# Patient Record
Sex: Male | Born: 1960
Health system: Southern US, Community
[De-identification: ages and names within clinical notes are randomized; demographics above are authoritative.]

## PROBLEM LIST (undated history)

## (undated) DIAGNOSIS — I82602 Acute embolism and thrombosis of unspecified veins of left upper extremity: Secondary | ICD-10-CM

## (undated) DIAGNOSIS — I872 Venous insufficiency (chronic) (peripheral): Secondary | ICD-10-CM

## (undated) DIAGNOSIS — Z950 Presence of cardiac pacemaker: Secondary | ICD-10-CM

## (undated) DIAGNOSIS — R072 Precordial pain: Secondary | ICD-10-CM

## (undated) DIAGNOSIS — E785 Hyperlipidemia, unspecified: Secondary | ICD-10-CM

## (undated) DIAGNOSIS — E119 Type 2 diabetes mellitus without complications: Secondary | ICD-10-CM

## (undated) DIAGNOSIS — C801 Malignant (primary) neoplasm, unspecified: Secondary | ICD-10-CM

## (undated) DIAGNOSIS — Z8673 Personal history of transient ischemic attack (TIA), and cerebral infarction without residual deficits: Secondary | ICD-10-CM

## (undated) DIAGNOSIS — E669 Obesity, unspecified: Secondary | ICD-10-CM

## (undated) DIAGNOSIS — N189 Chronic kidney disease, unspecified: Secondary | ICD-10-CM

## (undated) DIAGNOSIS — I495 Sick sinus syndrome: Secondary | ICD-10-CM

## (undated) DIAGNOSIS — Z8489 Family history of other specified conditions: Secondary | ICD-10-CM

## (undated) DIAGNOSIS — G4700 Insomnia, unspecified: Secondary | ICD-10-CM

## (undated) DIAGNOSIS — I10 Essential (primary) hypertension: Secondary | ICD-10-CM

## (undated) DIAGNOSIS — F32A Depression, unspecified: Secondary | ICD-10-CM

## (undated) DIAGNOSIS — G4730 Sleep apnea, unspecified: Secondary | ICD-10-CM

## (undated) DIAGNOSIS — G40909 Epilepsy, unspecified, not intractable, without status epilepticus: Secondary | ICD-10-CM

## (undated) DIAGNOSIS — M1990 Unspecified osteoarthritis, unspecified site: Secondary | ICD-10-CM

## (undated) DIAGNOSIS — E039 Hypothyroidism, unspecified: Secondary | ICD-10-CM

## (undated) DIAGNOSIS — R809 Proteinuria, unspecified: Secondary | ICD-10-CM

## (undated) DIAGNOSIS — R55 Syncope and collapse: Secondary | ICD-10-CM

## (undated) DIAGNOSIS — F329 Major depressive disorder, single episode, unspecified: Secondary | ICD-10-CM

## (undated) HISTORY — DX: Essential (primary) hypertension: I10

## (undated) HISTORY — DX: Venous insufficiency (chronic) (peripheral): I87.2

## (undated) HISTORY — DX: Hyperlipidemia, unspecified: E78.5

## (undated) HISTORY — DX: Obesity, unspecified: E66.9

## (undated) HISTORY — DX: Personal history of transient ischemic attack (TIA), and cerebral infarction without residual deficits: Z86.73

## (undated) HISTORY — DX: Precordial pain: R07.2

## (undated) HISTORY — DX: Proteinuria, unspecified: R80.9

## (undated) HISTORY — DX: Type 2 diabetes mellitus without complications: E11.9

## (undated) HISTORY — DX: Insomnia, unspecified: G47.00

## (undated) HISTORY — PX: COLONOSCOPY: SHX174

## (undated) HISTORY — DX: Sleep apnea, unspecified: G47.30

## (undated) HISTORY — PX: RESECTION BONE TUMOR FEMUR: SHX2326

---

## 2006-11-20 ENCOUNTER — Ambulatory Visit: Payer: Self-pay | Admitting: Cardiology

## 2008-07-06 ENCOUNTER — Encounter: Payer: Self-pay | Admitting: Cardiology

## 2008-07-24 ENCOUNTER — Ambulatory Visit: Payer: Self-pay | Admitting: Cardiology

## 2008-08-11 ENCOUNTER — Encounter: Payer: Self-pay | Admitting: Cardiology

## 2008-08-27 ENCOUNTER — Encounter: Payer: Self-pay | Admitting: Cardiology

## 2009-09-22 ENCOUNTER — Encounter: Payer: Self-pay | Admitting: Cardiology

## 2009-10-05 ENCOUNTER — Encounter: Payer: Self-pay | Admitting: Cardiology

## 2009-10-28 ENCOUNTER — Encounter: Payer: Self-pay | Admitting: Cardiology

## 2009-11-18 ENCOUNTER — Encounter: Payer: Self-pay | Admitting: Cardiology

## 2009-11-20 ENCOUNTER — Encounter: Payer: Self-pay | Admitting: Cardiology

## 2009-11-23 ENCOUNTER — Encounter: Payer: Self-pay | Admitting: Cardiology

## 2009-12-02 ENCOUNTER — Encounter: Payer: Self-pay | Admitting: Cardiology

## 2009-12-03 ENCOUNTER — Encounter: Payer: Self-pay | Admitting: Cardiology

## 2009-12-03 DIAGNOSIS — R609 Edema, unspecified: Secondary | ICD-10-CM | POA: Insufficient documentation

## 2009-12-03 DIAGNOSIS — IMO0002 Reserved for concepts with insufficient information to code with codable children: Secondary | ICD-10-CM | POA: Insufficient documentation

## 2009-12-03 DIAGNOSIS — I10 Essential (primary) hypertension: Secondary | ICD-10-CM | POA: Insufficient documentation

## 2009-12-03 DIAGNOSIS — E785 Hyperlipidemia, unspecified: Secondary | ICD-10-CM | POA: Insufficient documentation

## 2009-12-03 DIAGNOSIS — R072 Precordial pain: Secondary | ICD-10-CM | POA: Insufficient documentation

## 2009-12-03 DIAGNOSIS — E1165 Type 2 diabetes mellitus with hyperglycemia: Secondary | ICD-10-CM

## 2009-12-03 DIAGNOSIS — G4733 Obstructive sleep apnea (adult) (pediatric): Secondary | ICD-10-CM | POA: Insufficient documentation

## 2009-12-03 DIAGNOSIS — R809 Proteinuria, unspecified: Secondary | ICD-10-CM | POA: Insufficient documentation

## 2009-12-04 ENCOUNTER — Ambulatory Visit: Payer: Self-pay | Admitting: Cardiology

## 2009-12-04 ENCOUNTER — Encounter (INDEPENDENT_AMBULATORY_CARE_PROVIDER_SITE_OTHER): Payer: Self-pay | Admitting: *Deleted

## 2009-12-23 ENCOUNTER — Encounter: Payer: Self-pay | Admitting: Cardiology

## 2009-12-23 DIAGNOSIS — R072 Precordial pain: Secondary | ICD-10-CM

## 2009-12-23 HISTORY — DX: Precordial pain: R07.2

## 2009-12-24 ENCOUNTER — Ambulatory Visit: Payer: Self-pay | Admitting: Cardiology

## 2009-12-27 ENCOUNTER — Encounter: Payer: Self-pay | Admitting: Cardiology

## 2009-12-28 ENCOUNTER — Ambulatory Visit: Payer: Self-pay | Admitting: Cardiology

## 2010-08-24 NOTE — Letter (Signed)
Summary: External Correspondence/ OFFICE VISIT [facility]  External Correspondence/ OFFICE VISIT [facility]   Imported By: Bartholomew Boards 12/08/2009 14:55:29  _____________________________________________________________________  External Attachment:    Type:   Image     Comment:   External Document

## 2010-08-24 NOTE — Assessment & Plan Note (Signed)
Summary: NP-CHEST PAIN   Visit Type:  Initial Consult Referring Provider:  Lowanda Foster Primary Provider:  Stoney Bang  CC:  chest pain.  History of Present Illness: The patient is seen in consultation for the assessment of chest pain and shortness of breath.  Historically the patient has had a problem with volume overload.  An echo report from December, 2009, revealed that the patient had an ejection fraction of 60-65%.  There was mild increase in right ventricular size.  PA pressure was 38 mmHg.  The patient has significant sleep apnea.  He is using CPAP.  He sleeps in a recliner at night. The patient has exertional shortness of breath and chest discomfort.  He is significantly overweight.  By report his creatinine had gone up and he has been seen by nephrology. She tells me that his Glucophage and lisinopril were stopped and that his creatinine improved.  I do not have the numbers.  Preventive Screening-Counseling & Management  Alcohol-Tobacco     Smoking Status: quit     Year Started: 20 yrs     Year Quit: 15-16 yrs ago  Current Medications (verified): 1)  Aspir-Low 81 Mg Tbec (Aspirin) .... Take 1 Tablet By Mouth Once A Day 2)  Furosemide 80 Mg Tabs (Furosemide) .... Take 1 Tablet By Mouth Once A Day 3)  Tricor 145 Mg Tabs (Fenofibrate) .... Take 1 Tablet By Mouth Once A Day 4)  Cymbalta 60 Mg Cpep (Duloxetine Hcl) .... Take 1 Tablet By Mouth Once A Day 5)  Hydrochlorothiazide 25 Mg Tabs (Hydrochlorothiazide) .... Take 1 Tablet By Mouth Two Times A Day 6)  Atenolol 50 Mg Tabs (Atenolol) .... Take 1 Tablet By Mouth Once A Day 7)  Pravastatin Sodium 20 Mg Tabs (Pravastatin Sodium) .... Take 1 Tab By Mouth At Bedtime 8)  Glipizide 10 Mg Tabs (Glipizide) .... Take 1 Tablet By Mouth Two Times A Day 9)  Fish Oil 1000 Mg Caps (Omega-3 Fatty Acids) .... Take 1 Tablet By Mouth Two Times A Day 10)  Symlinpen 60 1000 Mcg/ml Soln (Pramlintide Acetate) .Marland Kitchen.. 15 Units Three Times A Day 11)  Doxepin  Hcl 100 Mg Caps (Doxepin Hcl) .... Take 1 Tablet By Mouth Two Times A Day For Tick Bite 12)  Humalog 100 Unit/ml Soln (Insulin Lispro (Human)) .... Sliding Scale 13)  C-Pap Machine .... Use As Directed At Bedtime  Allergies (verified): No Known Drug Allergies  Past History:  Past Medical History: SLEEP APNEA...Dr. Brandon Melnick and a EDEMA ..venous insufficiency Proteinuria OBESITY DM  HYPERTENSION, BENIGN  PRECORDIAL PAIN  HYPERLIPIDEMIA EF  60-65%.. echo.. December, 2009.... mild increased RV... 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 Father had 1st heart attack age 81 but died number of years later. Mother had heart attack age 38 Mother and sister had cancer of breast One sister had cancer of the colon  Social History: Reviewed history and no changes required. he does not smokeSmoking Status:  quit  Review of Systems       Patient denies fever, chills, headache, sweats, rash, change in vision, change in hearing, nausea vomiting, urinary symptoms.  All other systems are reviewed and are negative.  Vital Signs:  Patient profile:   50 year old male Height:      68 inches Weight:      323.25 pounds BMI:     49.33 O2 Sat:  97 % on Room air Pulse rate:   53 / minute BP sitting:   150 / 88  (left arm) Cuff size:   large  Vitals Entered By: Lovina Reach, LPN (May 13, 624THL 624THL AM)  Nutrition Counseling: Patient's BMI is greater than 25 and therefore counseled on weight management options.  O2 Flow:  Room air CC: chest pain Is Patient Diabetic? Yes Comments chest pain off/on x 1 year.  States he had been telling PMD about this, but did not do anything.  Also, recently sent to Dr. Hinda Lenis by PMD to check for blockages in his kidneys.  Kidney MD more persistant about being seen for this chest pain.   Also, c/o alot of pain in both legs, left worse.    Physical  Exam  General:  patient is overweight but stable in general. Head:  head is atraumatic. Eyes:  no xanthelasma. Neck:  no jugular venous distention.  No carotid bruits. Chest Wall:  no chest wall tenderness. Lungs:  lungs are clear.  Respiratory effort is nonlabored. Heart:  cardiac exam reveals S1-S2.  No clicks or significant murmurs. Abdomen:  abdomen is obese but soft. Msk:  no musculoskeletal deformities. Extremities:  trace peripheral edema. Skin:  no skin rashes. Psych:  patient is oriented to person time and place.  Affect is normal.   Impression & Recommendations:  Problem # 1:  DM (ICD-250.00)  His updated medication list for this problem includes:    Aspir-low 81 Mg Tbec (Aspirin) .Marland Kitchen... Take 1 tablet by mouth once a day    Glipizide 10 Mg Tabs (Glipizide) .Marland Kitchen... Take 1 tablet by mouth two times a day    Symlinpen 60 1000 Mcg/ml Soln (Pramlintide acetate) .Marland KitchenMarland KitchenMarland KitchenMarland Kitchen 15 units three times a day    Humalog 100 Unit/ml Soln (Insulin lispro (human)) ..... Sliding scale The patient is receiving treatment for his diabetes.  This increases his risk for cardiovascular disease and we will be aggressive.  Problem # 2:  OBSTRUCTIVE SLEEP APNEA (ICD-327.23) The patient does use his CPAP.  Problem # 3:  OBESITY, UNSPECIFIED (ICD-278.00) Weight loss would be very important for him.  This would certainly be important for all aspects of his care.  Problem # 4:  ESSENTIAL HYPERTENSION, BENIGN (ICD-401.1)  His updated medication list for this problem includes:    Aspir-low 81 Mg Tbec (Aspirin) .Marland Kitchen... Take 1 tablet by mouth once a day    Furosemide 80 Mg Tabs (Furosemide) .Marland Kitchen... Take 1 tablet by mouth once a day    Hydrochlorothiazide 25 Mg Tabs (Hydrochlorothiazide) .Marland Kitchen... Take 1 tablet by mouth two times a day    Atenolol 50 Mg Tabs (Atenolol) .Marland Kitchen... Take 1 tablet by mouth once a day blood pressure is mildly elevated today.  I chosen not to change his medicines until I know more information from  his nephrologist  Problem # 5:  HYPERLIPIDEMIA (ICD-272.4)  His updated medication list for this problem includes:    Tricor 145 Mg Tabs (Fenofibrate) .Marland Kitchen... Take 1 tablet by mouth once a day    Pravastatin Sodium 20 Mg Tabs (Pravastatin sodium) .Marland Kitchen... Take 1 tab by mouth at bedtime Patient is receiving no treatment for his elevated cholesterol.  Problem # 6:  PRECORDIAL PAIN (ICD-786.51)  His updated medication list for this problem includes:    Aspir-low 81 Mg Tbec (Aspirin) .Marland Kitchen... Take 1 tablet by mouth once a day    Atenolol 50 Mg Tabs (Atenolol) .Marland Kitchen... Take 1 tablet by mouth once a day  Orders: EKG w/ Interpretation (93000) Nuclear Med (Nuc Med) The main symptom at this time his shortness of breath and some chest heaviness with exertion.  This certainly could be anginal.  EKG is done today and reviewed by me.  There is sinus bradycardia.  There is no other significant abnormality.  I will arrange for the patient to have 2-D echo to reassess LV function and valvular function.  He will also have pharmacologic nuclear study to rule out significant ischemia.  I will then see him for followup.  Other Orders: 2-D Echocardiogram (2D Echo)  Patient Instructions: 1)  FOLLOW UP APPT WITH DR. Sharissa Brierley ON TUESDAY, MAY 24TH AT 2:15PM. 2)  Your physician has requested that you have an echocardiogram.  Echocardiography is a painless test that uses sound waves to create images of your heart. It provides your doctor with information about the size and shape of your heart and how well your heart's chambers and valves are working.  This procedure takes approximately one hour. There are no restrictions for this procedure. 3)  Your physician has requested that you have an Agricultural consultant.  For further information please visit [email or web address].  Please follow instruction sheet, as given.

## 2010-08-24 NOTE — Letter (Signed)
Summary: Lexiscan or Dobutamine Adult nurse at [facility]. [address]   [place], [place] [address]   Phone: [phone]  Fax: [phone]      Reynolds or Dobutamine Cardiolite Strss Test    [facility]  Appointment Date:_  Appointment Time:_  Your doctor has ordered a CARDIOLITE STRESS TEST using a medication to stimulate exercise so that you will not have to walk on the treadmill to determine the condition of your heart during stress. If you take blood pressure medication, ask your doctor if you should take it the day of your test. You should not have anything to eat or drink at least 4 hours before your test is scheduled, and no caffeine, including decaffeinated tea and coffee, chocolate, and soft drinks for 24 hours before your test.  You will need to register at the Outpatient/Main Entrance at the hospital 15 minutes before your appointment time. It is a good idea to bring a copy of your order with you. They will direct you to the Diagnostic Imaging (Radiology) Department.  You will be asked to undress from the waist up and given a hospital gown to wear, so dress comfortably from the waist down for example: Sweat pants, shorts, or skirt Rubber soled lace up shoes (tennis shoes)  Plan on about three hours from registration to release from the hospital   You may want to hold your fluid pills and diabetic meds until after your test, but you can take your other medications with a sip of water.

## 2010-08-24 NOTE — Assessment & Plan Note (Signed)
Summary: 2-3 WK F/U PER 5/13 OV-JM   Visit Type:  Follow-up Referring Provider:  Lowanda Foster Primary Provider:  Biagio Quint  CC:  shortness of breath and chest pain.  History of Present Illness: patient is seen in followup he is shortness of breath and chest pain.  I saw him on Dec 04, 2009. At that time he seems stable and plans were made to proceed with an echo and a nuclear stress study.  These studies were done.  2-D echo showed an ejection fraction of 60-65% with mild LVH.  There were no wall motion abnormalities.  Nuclear scan revealed no significant ischemia.  Patient returns today and he is stable.  He says that he will probably need knee surgery.  Preventive Screening-Counseling & Management  Alcohol-Tobacco     Smoking Status: quit     Year Quit: 1994  Current Medications (verified): 1)  Aspir-Low 81 Mg Tbec (Aspirin) .... Take 1 Tablet By Mouth Once A Day 2)  Furosemide 80 Mg Tabs (Furosemide) .... Take 1 Tablet By Mouth Once A Day 3)  Tricor 145 Mg Tabs (Fenofibrate) .... Take 1 Tablet By Mouth Once A Day 4)  Cymbalta 60 Mg Cpep (Duloxetine Hcl) .... Take 1 Tablet By Mouth Once A Day 5)  Hydrochlorothiazide 25 Mg Tabs (Hydrochlorothiazide) .... Take 1 Tablet By Mouth Two Times A Day 6)  Atenolol 50 Mg Tabs (Atenolol) .... Take 1 Tablet By Mouth Once A Day 7)  Pravastatin Sodium 20 Mg Tabs (Pravastatin Sodium) .... Take 1 Tab By Mouth At Bedtime 8)  Glipizide 10 Mg Tabs (Glipizide) .... Take 1 Tablet By Mouth Two Times A Day 9)  Fish Oil 1000 Mg Caps (Omega-3 Fatty Acids) .... Take 1 Tablet By Mouth Two Times A Day 10)  Symlinpen 60 1000 Mcg/ml Soln (Pramlintide Acetate) .... 20 Units Three Times A Day 11)  Humalog 100 Unit/ml Soln (Insulin Lispro (Human)) .... Sliding Scale 12)  C-Pap Machine .... Use As Directed At Bedtime 13)  Lortab 7.5-500 Mg Tabs (Hydrocodone-Acetaminophen) .... Take 1 Tablet By Mouth Three Times A Day  Allergies (verified): No Known Drug  Allergies  Comments:  Nurse/Medical Assistant: The patient's medication list and allergies were reviewed with the patient and were updated in the Medication and Allergy Lists.  Past History:  Past Medical History: SLEEP APNEA...Dr. Brandon Melnick and a EDEMA ..venous insufficiency Proteinuria OBESITY DM  HYPERTENSION, BENIGN  PRECORDIAL PAIN ...nuclear stress...12/23/2009.....no ischemia...EF 60% HYPERLIPIDEMIA EF  60-65%.. echo.. December, 2009.... mild increased RV... 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  All other systems are reviewed and are negative.  He does have discomfort in his knee  Vital Signs:  Patient profile:   50 year old male Height:      68 inches Weight:      324 pounds Pulse rate:   62 / minute BP sitting:   146 / 83  (left arm) Cuff size:   large  Vitals Entered By: Georgina Peer (December 28, 2009 2:39 PM)  Physical Exam  General:  patient is overweight and stable. Eyes:  no xanthelasma. Neck:  no jugulovenous that appeared Lungs:  lungs are clear.  Respiratory effort is nonlabored. Heart:  cardiac exam reveals S1-S2.  No clicks or significant murmurs. Abdomen:  abdomen is obese,  but soft. Extremities:  no peripheral edema. Psych:  patient is oriented to person time and place.  Affect is normal.   Impression & Recommendations:  Problem # 1:  EDEMA (ICD-782.3) The patient has no significant edema today.  This is stable.  Problem # 2:  OBESITY, UNSPECIFIED (ICD-278.00) Weight loss will be extremely important for the patient.  Problem # 3:  ESSENTIAL HYPERTENSION, BENIGN (ICD-401.1)  His updated medication list for this problem includes:    Aspir-low 81 Mg Tbec (Aspirin) .Marland Kitchen... Take 1 tablet by mouth once a day    Furosemide 80 Mg Tabs (Furosemide)  .Marland Kitchen... Take 1 tablet by mouth once a day    Hydrochlorothiazide 25 Mg Tabs (Hydrochlorothiazide) .Marland Kitchen... Take 1 tablet by mouth two times a day    Atenolol 50 Mg Tabs (Atenolol) .Marland Kitchen... Take 1 tablet by mouth once a day blood pressure is under reasonable control today.  No change in therapy.  Problem # 4:  PRECORDIAL PAIN (ICD-786.51)  His updated medication list for this problem includes:    Aspir-low 81 Mg Tbec (Aspirin) .Marland Kitchen... Take 1 tablet by mouth once a day    Atenolol 50 Mg Tabs (Atenolol) .Marland Kitchen... Take 1 tablet by mouth once a day The patient's echo and nuclear scan showed no evidence of significant abnormalities.  Further workup is not needed.   The patient is cleared for knee surgery from the cardiac viewpoint.  Patient Instructions: 1)  No further cardiac follow up needed.

## 2010-08-24 NOTE — Miscellaneous (Signed)
  Clinical Lists Changes  Observations: Added new observation of PAST MED HX: SLEEP APNEA...Dr. Brandon Melnick and a EDEMA ..venous insufficiency Proteinuria OBESITY DM  HYPERTENSION, BENIGN  PRECORDIAL PAIN ...nuclear stress...6/1/201..no ischemia...EF 60% HYPERLIPIDEMIA EF  60-65%.. echo.. December, 2009.... mild increased RV... 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 Brandon Melnick and a EDEMA ..venous insufficiency Proteinuria OBESITY DM  HYPERTENSION, BENIGN  PRECORDIAL PAIN ...nuclear stress...6/1/201..no ischemia...EF 60% HYPERLIPIDEMIA EF  60-65%.. echo.. December, 2009.... mild increased RV... 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

## 2010-08-24 NOTE — Letter (Signed)
Summary: External Correspondence/ OFFICE VISIT DR. HASANAJ  External Correspondence/ OFFICE VISIT DR. HASANAJ   Imported By: Bartholomew Boards 11/30/2009 11:18:30  _____________________________________________________________________  External Attachment:    Type:   Image     Comment:   External Document

## 2010-08-24 NOTE — Letter (Signed)
Summary: External Correspondence/ OFFICE VISIT [facility]  External Correspondence/ OFFICE VISIT [facility]   Imported By: Bartholomew Boards 12/08/2009 14:59:26  _____________________________________________________________________  External Attachment:    Type:   Image     Comment:   External Document

## 2010-08-24 NOTE — Letter (Signed)
Summary: External Correspondence/ OFFICE VISIT [facility]  External Correspondence/ OFFICE VISIT [facility]   Imported By: Bartholomew Boards 12/08/2009 14:57:28  _____________________________________________________________________  External Attachment:    Type:   Image     Comment:   External Document

## 2010-08-24 NOTE — Miscellaneous (Signed)
  Clinical Lists Changes  Problems: Removed problem of UNSPECIFIED SLEEP APNEA (ICD-780.57) Removed problem of DIAB W/RENAL MANIFESTS TYPE II/UNS NOT UNCNTRL (ICD-250.40) Added new problem of DM (ICD-250.00) Added new problem of PROTEINURIA (ICD-791.0) Observations: Added new observation of PAST MED HX: SLEEP APNEA...Dr. Brandon Melnick and a EDEMA ..venous insufficiency Proteinuria OBESITY DM  HYPERTENSION, BENIGN  PRECORDIAL PAIN  HYPERLIPIDEMIA EF  60-65%.. echo.. December, 2009.... mild increased RV... 38 mmHg   (12/03/2009 16:47)       Past History:  Past Medical History: SLEEP APNEA...Dr. Brandon Melnick and a EDEMA ..venous insufficiency Proteinuria OBESITY DM  HYPERTENSION, BENIGN  PRECORDIAL PAIN  HYPERLIPIDEMIA EF  60-65%.. echo.. December, 2009.... mild increased RV... 38 mmHg

## 2011-01-20 ENCOUNTER — Encounter: Payer: Self-pay | Admitting: Cardiology

## 2011-07-26 HISTORY — PX: TOTAL KNEE ARTHROPLASTY: SHX125

## 2013-01-28 ENCOUNTER — Telehealth: Payer: Self-pay | Admitting: *Deleted

## 2013-01-28 NOTE — Telephone Encounter (Signed)
Debra Hyler,  Called on behalf of pt concerned that pt is taking too much insulin. She stated that his PCP is also concerned about the amount of insulin the pt is taking. Pt had a 'spell', his bg bottomed out a week ago. Please call her back to discuss at [phone].

## 2013-01-28 NOTE — Telephone Encounter (Signed)
Discussed with patient, about a week ago he had a transient near-syncopal episode and apparently looked blue and recovered without any administration of glucose. Blood sugar before this episode was 104 and was normal later that night. He has been discussing passing out episode with his primary care physician. Advised him to call if blood sugars are low, meanwhile continue same dose, his last blood sugar was 85

## 2013-05-20 ENCOUNTER — Other Ambulatory Visit (INDEPENDENT_AMBULATORY_CARE_PROVIDER_SITE_OTHER): Payer: Medicaid Other

## 2013-05-20 DIAGNOSIS — E119 Type 2 diabetes mellitus without complications: Secondary | ICD-10-CM

## 2013-05-20 DIAGNOSIS — E785 Hyperlipidemia, unspecified: Secondary | ICD-10-CM

## 2013-05-20 LAB — URINALYSIS, ROUTINE W REFLEX MICROSCOPIC
Ketones, ur: NEGATIVE
Total Protein, Urine: 30
Urine Glucose: NEGATIVE
Urobilinogen, UA: 0.2 (ref 0.0–1.0)
pH: 6 (ref 5.0–8.0)

## 2013-05-20 LAB — MICROALBUMIN / CREATININE URINE RATIO
Microalb Creat Ratio: 13.2 mg/g (ref 0.0–30.0)
Microalb, Ur: 21.7 mg/dL — ABNORMAL HIGH (ref 0.0–1.9)

## 2013-05-20 LAB — LIPID PANEL
HDL: 37.3 mg/dL — ABNORMAL LOW (ref >39.00)
Total CHOL/HDL Ratio: 4
VLDL: 53 mg/dL — ABNORMAL HIGH (ref 0.0–40.0)

## 2013-05-20 LAB — HEMOGLOBIN A1C: Hgb A1c MFr Bld: 14.3 % — ABNORMAL HIGH (ref 4.6–6.5)

## 2013-05-20 LAB — COMPREHENSIVE METABOLIC PANEL
ALT: 33 U/L (ref 0–53)
AST: 28 U/L (ref 0–37)
Albumin: 4 g/dL (ref 3.5–5.2)
Alkaline Phosphatase: 119 U/L — ABNORMAL HIGH (ref 39–117)
BUN: 20 mg/dL (ref 6–23)
Calcium: 9.7 mg/dL (ref 8.4–10.5)
Chloride: 99 mEq/L (ref 96–112)
Creatinine, Ser: 1.4 mg/dL (ref 0.4–1.5)
Potassium: 3.8 mEq/L (ref 3.5–5.1)
Sodium: 139 mEq/L (ref 135–145)
Total Bilirubin: 0.7 mg/dL (ref 0.3–1.2)

## 2013-05-20 LAB — LDL CHOLESTEROL, DIRECT: Direct LDL: 77.9 mg/dL

## 2013-05-20 NOTE — Addendum Note (Signed)
Addended by: Guinn Delarosa, [place] A on: 05/20/2013 02:35 PM   Modules accepted: Orders

## 2013-05-23 ENCOUNTER — Encounter: Payer: Self-pay | Admitting: Endocrinology

## 2013-05-23 ENCOUNTER — Ambulatory Visit (INDEPENDENT_AMBULATORY_CARE_PROVIDER_SITE_OTHER): Payer: Medicaid Other | Admitting: Endocrinology

## 2013-05-23 VITALS — BP 118/60 | HR 56 | Temp 98.4°F | Resp 12 | Ht 69.0 in | Wt 307.8 lb

## 2013-05-23 DIAGNOSIS — IMO0001 Reserved for inherently not codable concepts without codable children: Secondary | ICD-10-CM

## 2013-05-23 NOTE — Progress Notes (Signed)
Ethan Campbell is an 52 y.o. male.   Reason for Appointment: Diabetes follow-up   History of Present Illness   Diagnosis: Type 2 DIABETES MELITUS, date of diagnosis: 2000    Previous history: he has been on insulin for several years with consistently poor control Has been requiring large doses of insulin for his diabetes but A1c has been persistently high Blood sugars did not improve significantly even with trying Byetta and Victoza Earlier this year he was switched from NovoLog to U-500 insulin but not clear if he has had improvement in control except with fasting readings His last A1c was 14.0 in 5/14  Recent history:  Despite discussion with diabetes educator and dietitian in June his blood sugars continue to be very erratic and mostly high A1c is still markedly increased He is not checking his blood sugars very much and difficult to get a pattern He does tend to have hyperglycemia periodically and he thinks this is from increased activity on those days His highest blood sugars appear to be in the afternoon and in the evening or late at night but not consistently     Oral hypoglycemic drugs: none     Side effects from medications: None Insulin regimen: U-500 insulin: 8--12 a.c. Lantus 60 twice a day       Proper timing of medications in relation to meals: Yes.          Monitors blood glucose: Once a day.    Glucometer: One Touch.          Blood Glucose readings from meter download: readings 10 AM-12 none = 120-167 with one reading of 289 today 2 PM-6 PM 51-504 with low readings on Monday and much higher readings on the other days 7 PM-1 AM he has 3 low normal readings and other readings around 250-350 Has 17 readings in the last 30 days with average 217   Hypoglycemia frequency:  mostly after 3 PM with low readings on about 2 or 3 days only        Meals: 3 meals per day.eating generally at   1 pm and 7 pm  Last dietitian visit: 6/14 when problems identified were as follows:  Sometimes eating fried food which would increase his blood sugars. Also sometimes eating a snack at bedtime like a sandwich without any insulin      Physical activity: exercise: walking and yardwork            Wt Readings from Last 3 Encounters:  05/23/13 307 lb 12.8 oz (139.617 kg)  12/28/09 324 lb (146.965 kg)  12/04/09 323 lb 4 oz (146.625 kg)    LABS:  Lab Results  Component Value Date   HGBA1C 14.3* 05/20/2013   Lab Results  Component Value Date   MICROALBUR 21.7* 05/20/2013   CREATININE 1.4 05/20/2013    Lab on 05/20/2013  Component Date Value Range Status  . Microalb, Ur 05/20/2013 21.7* 0.0 - 1.9 mg/dL Final  . Creatinine,U 05/20/2013 163.9   Final  . Microalb Creat Ratio 05/20/2013 13.2  0.0 - 30.0 mg/g Final  . Cholesterol 05/20/2013 150  0 - 200 mg/dL Final   ATP III Classification       Desirable:  < 200 mg/dL               Borderline High:  200 - 239 mg/dL          High:  > = 240 mg/dL  . Triglycerides 05/20/2013 265.0* 0.0 - 149.0 mg/dL Final  Normal:  <150 mg/dLBorderline High:  150 - 199 mg/dL  . HDL 05/20/2013 37.30* >39.00 mg/dL Final  . VLDL 05/20/2013 53.0* 0.0 - 40.0 mg/dL Final  . Total CHOL/HDL Ratio 05/20/2013 4   Final                  Men          Women1/2 Average Risk     3.4          3.3Average Risk          5.0          4.42X Average Risk          9.6          7.13X Average Risk          15.0          11.0                      . Hemoglobin A1C 05/20/2013 14.3* 4.6 - 6.5 % Final   Glycemic Control Guidelines for People with Diabetes:Non Diabetic:  <6%Goal of Therapy: <7%Additional Action Suggested:  >8%   . Sodium 05/20/2013 139  135 - 145 mEq/L Final  . Potassium 05/20/2013 3.8  3.5 - 5.1 mEq/L Final  . Chloride 05/20/2013 99  96 - 112 mEq/L Final  . CO2 05/20/2013 30  19 - 32 mEq/L Final  . Glucose, Bld 05/20/2013 81  70 - 99 mg/dL Final  . BUN 05/20/2013 20  6 - 23 mg/dL Final  . Creatinine, Ser 05/20/2013 1.4  0.4 - 1.5 mg/dL Final  . Total  Bilirubin 05/20/2013 0.7  0.3 - 1.2 mg/dL Final  . Alkaline Phosphatase 05/20/2013 119* 39 - 117 U/L Final  . AST 05/20/2013 28  0 - 37 U/L Final  . ALT 05/20/2013 33  0 - 53 U/L Final  . Total Protein 05/20/2013 7.9  6.0 - 8.3 g/dL Final  . Albumin 05/20/2013 4.0  3.5 - 5.2 g/dL Final  . Calcium 05/20/2013 9.7  8.4 - 10.5 mg/dL Final  . GFR 05/20/2013 55.62* >60.00 mL/min Final  . Color, Urine 05/20/2013 LT. YELLOW  Yellow;Lt. Yellow Final  . APPearance 05/20/2013 CLEAR  Clear Final  . Specific Gravity, Urine 05/20/2013 1.025  1.000-1.030 Final  . pH 05/20/2013 6.0  5.0 - 8.0 Final  . Total Protein, Urine 05/20/2013 30  Negative Final  . Urine Glucose 05/20/2013 NEGATIVE  Negative Final  . Ketones, ur 05/20/2013 NEGATIVE  Negative Final  . Bilirubin Urine 05/20/2013 NEGATIVE  Negative Final  . Hgb urine dipstick 05/20/2013 SMALL  Negative Final  . Urobilinogen, UA 05/20/2013 0.2  0.0 - 1.0 Final  . Leukocytes, UA 05/20/2013 NEGATIVE  Negative Final  . Nitrite 05/20/2013 NEGATIVE  Negative Final  . WBC, UA 05/20/2013 0-2/hpf  0-2/hpf Final  . RBC / HPF 05/20/2013 3-6/hpf  0-2/hpf Final  . Mucus, UA 05/20/2013 Presence of  None Final  . Squamous Epithelial / LPF 05/20/2013 Rare(0-4/hpf)  Rare(0-4/hpf) Final  . Granular Casts, UA 05/20/2013 Presence of  None Final  . Direct LDL 05/20/2013 77.9   Final   Optimal:  <100 mg/dLNear or Above Optimal:  100-129 mg/dLBorderline High:  130-159 mg/dLHigh:  160-189 mg/dLVery High:  >190 mg/dL      Medication List       This list is accurate as of: 05/23/13  3:52 PM.  Always use your most recent med list.  aspirin 81 MG EC tablet  Take 81 mg by mouth daily.     atenolol 50 MG tablet  Commonly known as:  TENORMIN  Take 50 mg by mouth daily.     DULoxetine 60 MG capsule  Commonly known as:  CYMBALTA  Take 60 mg by mouth daily.     fenofibrate 145 MG tablet  Commonly known as:  TRICOR  Take 145 mg by mouth daily.      Fish Oil 1000 MG Caps  Take 1,000 mg by mouth 2 (two) times daily.     FLUoxetine 20 MG capsule  Commonly known as:  PROZAC  Take 20 mg by mouth daily.     furosemide 80 MG tablet  Commonly known as:  LASIX  Take 80 mg by mouth daily.     gabapentin 300 MG capsule  Commonly known as:  NEURONTIN  Take 300 mg by mouth 3 (three) times daily.     glipiZIDE 10 MG tablet  Commonly known as:  GLUCOTROL  Take 10 mg by mouth 2 (two) times daily.     HUMALOG 100 UNIT/ML injection  Generic drug:  insulin lispro  Inject into the skin. Sliding scale.     HUMULIN R U-500 (CONCENTRATED) [place]  Inject into the skin. Takes 8 units in am and 12 units in the afternoon     hydrochlorothiazide 25 MG tablet  Commonly known as:  HYDRODIURIL  Take 25 mg by mouth 2 (two) times daily.     HYDROcodone-acetaminophen 7.5-500 MG per tablet  Commonly known as:  LORTAB  Take 1 tablet by mouth 3 (three) times daily.     LANTUS OPTICLIK 100 UNIT/ML Soct  Generic drug:  Insulin Glargine  Inject into the skin. Takes 60 units twice a day     NON FORMULARY  C-PAP Machine. Use as directed at bedtime.     potassium chloride 10 MEQ tablet  Commonly known as:  K-DUR  Take 10 mEq by mouth 2 (two) times daily.     pramlintide 1000 MCG/ML injection  Commonly known as:  SYMLIN  Inject 20 mcg into the skin 3 (three) times daily.     pravastatin 20 MG tablet  Commonly known as:  PRAVACHOL  Take 20 mg by mouth at bedtime.     VICTOZA 18 MG/3ML Sopn  Generic drug:  Liraglutide  Inject 1.2 mg into the skin.        Allergies: No Known Allergies  Past Medical History  Diagnosis Date  . Sleep apnea     Dr. Brandon Melnick  . Edema     Venous insufficiency  . Proteinuria   . Obesity   . Diabetes mellitus   . Hypertension     Benign  . Precordial pain 12/23/09    Nuclear stress; no ischemia; EF 60%  . Hyperlipidemia   . History of echocardiogram 12/09    EF 60-65%; mild increased RV; 38 mmHg    Past  Surgical History  Procedure Laterality Date  . 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 Heart attack Mother 13  . Cancer Mother     Breast  . Heart attack Father 79  . Cancer Sister     Breast  . Cancer Sister     Colon    Social History:  reports that he has quit smoking. He has never used  smokeless tobacco. His alcohol and drug histories are not on file.  Review of Systems:  Hypertension: currently on atenolol, HCTZ, Lasix and blood pressure is low normal today No history of edema recently  Lipids: triglycerides are still relatively high at 265 despite taking fenofibrate. LDL is below 100, taking pravastatin    CKD: His creatinine has ranged from 1.6-2.1 in the last 2 years   Examination:   BP 118/60  Pulse 56  Temp(Src) 98.4 F (36.9 C)  Resp 12  Ht 5\' 9"  (1.753 m)  Wt 307 lb 12.8 oz (139.617 kg)  BMI 45.43 kg/m2  SpO2 96%  Body mass index is 45.43 kg/(m^2).    ASSESSMENT/ PLAN::   Diabetes type 2   Blood glucose control appears very poor because of persistently high A1c of 14%. Not clear when he is having high blood sugars as his blood sugar patterns are quite erratic at all different times Also is not checking blood sugars enough to identify patterns  Although he is reportedly doing well with his diet he is still unable to lose weight Still requiring large doses of insulin He thinks he is compliant with his insulin as directed Overall appears to have mostly high postprandial readings since most of his morning readings are relatively good  HYPERLIPIDEMIA: LDL is controlled, triglycerides still relatively higher from poor diabetes control and marked obesity  PLAN:  He will be scheduled for continuous glucose monitoring using iPro to help identify blood sugar patterns, mealtime control, overnight blood sugar patterns and also effects of various meals and exercise  on his glucose Also should be able to help guide his insulin doses for meals as well as diet better with this  Will increase his U-500 insulin by at least 2 units twice a day for now He will reduce the morning dose to only 6 units when he is planning to be more active Advised him to keep his U-500 insulin refrigerated all the time and may take a dose in the syringe if he is planning to eat out He will get a new bottle of insulin at least every 60 days He will check his blood sugars more frequently Will reconsider trying metformin and/or Invokana since his renal function is much better Also would reduce his LANTUS insulin in the morning since his lower sugars are in the afternoon and early evening and also his glucose was much lower after his injection on the day of the lab  Counseling time over 50% of today's 25 minute visit  Ethan Campbell 05/23/2013, 3:52 PM

## 2013-05-23 NOTE — Patient Instructions (Signed)
Change Humulin R every 2 months  LANTUS 50 IN AM AND 60 IN PM  HUMULIN R 10 BEFORE LUNCH AND 14 BEFORE SUPPER IF PLANNING active reduce am R dose to 6 units  Please check blood sugars at least half the time about 2 hours after any meal and as directed on waking up. Please bring blood sugar monitor to each visit

## 2013-06-27 ENCOUNTER — Other Ambulatory Visit: Payer: Self-pay | Admitting: Endocrinology

## 2013-07-04 ENCOUNTER — Encounter: Payer: Medicaid Other | Attending: Endocrinology | Admitting: *Deleted

## 2013-07-04 ENCOUNTER — Encounter: Payer: Self-pay | Admitting: *Deleted

## 2013-07-04 DIAGNOSIS — Z713 Dietary counseling and surveillance: Secondary | ICD-10-CM | POA: Insufficient documentation

## 2013-07-04 DIAGNOSIS — IMO0001 Reserved for inherently not codable concepts without codable children: Secondary | ICD-10-CM | POA: Insufficient documentation

## 2013-07-04 NOTE — Progress Notes (Signed)
iPro Set Up  Time arrived:1500  Time left: [id number] Patient here for placement of iPro2 Continuous Glucose Monitor  Explained to patient purpose of wearing the iPro per MD orders. They expressed verbal understanding.  Sensor inserted into skin at least 2 inches from any insulin injection sites. Patient instructed to check BG at least 4 times each day. Explained to patient how to complete the iPro2 Log Sheet including time of all BG, food intake, exercise and any diabetes medications.  Patient instructed to return on 07/12/13 for removal of sensor and iPro2 along with the completed Log Sheet  iPro2 Recorder attached to sensor and taped down.  Patient informed that when iPro2 and sensor are connected, the system is waterproof and that they are to wear it consistently until they return to this office.  Patient to call this office if any questions or concerns prior to appointment to return the iPro2 for downloading. He states he has an appointment with Dr. Elayne Snare tomorrow. I have suggested he post pone that appointment until after he has worn the iPro so his MD will have the data he needs to assess and make any adjustments. He plans to notify the office of this and let me know so we can plan on his return of the iPro accordingly.

## 2013-07-10 ENCOUNTER — Encounter: Payer: Self-pay | Admitting: Endocrinology

## 2013-07-10 ENCOUNTER — Encounter: Payer: Medicaid Other | Admitting: Nutrition

## 2013-07-10 ENCOUNTER — Ambulatory Visit (INDEPENDENT_AMBULATORY_CARE_PROVIDER_SITE_OTHER): Payer: Medicaid Other | Admitting: Endocrinology

## 2013-07-10 VITALS — BP 128/76 | HR 60 | Temp 98.3°F | Resp 12 | Ht 69.0 in | Wt 317.1 lb

## 2013-07-10 DIAGNOSIS — I10 Essential (primary) hypertension: Secondary | ICD-10-CM

## 2013-07-10 DIAGNOSIS — E785 Hyperlipidemia, unspecified: Secondary | ICD-10-CM

## 2013-07-10 DIAGNOSIS — IMO0001 Reserved for inherently not codable concepts without codable children: Secondary | ICD-10-CM

## 2013-07-10 LAB — BASIC METABOLIC PANEL
BUN: 29 mg/dL — ABNORMAL HIGH (ref 6–23)
CO2: 32 mEq/L (ref 19–32)
Chloride: 99 mEq/L (ref 96–112)
Glucose, Bld: 59 mg/dL — ABNORMAL LOW (ref 70–99)
Potassium: 3.5 mEq/L (ref 3.5–5.1)
Sodium: 140 mEq/L (ref 135–145)

## 2013-07-10 NOTE — Patient Instructions (Signed)
Increase the amount of R insulin by 2-3 units when eating a desert. Increase the amount of R insulin by 2-3 units when eating fried/high fat meals, like [place], [place] and [place].

## 2013-07-10 NOTE — Progress Notes (Signed)
Patient was shown his CGM.  We reviewed each meal that he listed in the diet history, his insulin dose, and what effect it had on his blood sugar reading.  His meter download was also available and he was shown how high his blood sugar is going after each meal eaten.  He says that he is waiting 30 minutes after taking his R insulin before eating his meals.  He is taking only set doses of R insulin for each meal, without reguard to the amount of carbs and fat in the meal.  We discussed the need to increase his mealtime coverage of Humulin R u500, when eating higher carb meals and higher fat meals.  Discussed the amount of  carbs in the meals eaten, as well as the amount of fat, and he could see the effect on blood sugar when eating more carbs(deserts), and fat, and the need for more R insulin.  Discussed the need to reduce the amounts of fats in the meal, and how to do this, but he was not receptive to those suggestions.

## 2013-07-10 NOTE — Patient Instructions (Signed)
LANTUS 30 IN PM, 60 in am  HUMULIN R 10 FOR BREAKFAST; 16 FOR LUNCH AND 18 AT SUPPER plus 2-4 units for hi sugars

## 2013-07-10 NOTE — Progress Notes (Signed)
Patient ID: Ethan Campbell, male   DOB: February 23, 1961, 52 y.o.   MRN: [id number]  Ethan Campbell is an 52 y.o. male.   Reason for Appointment: Diabetes follow-up   History of Present Illness   Diagnosis: Type 2 DIABETES MELITUS, date of diagnosis: 2000    Previous history: he has been on insulin for several years with consistently poor control Has been requiring large doses of insulin for his diabetes but A1c has been persistently high Blood sugars did not improve significantly even with trying Byetta and Victoza Earlier this year he was switched from NovoLog to U-500 insulin but not clear if he has had improvement in control except with fasting readings His last A1c was 14.0 in 5/14  Recent history:  Despite discussion with diabetes educator and dietitian in June his blood sugars continue to be very erratic and mostly high A1c has been persistently markedly increased He is recently checking his blood sugars more consistently and at least twice a day on an average Currently his blood sugar patterns show relatively good readings between about 7 a.m.-noon usually, significantly high readings after about 2 PM until late at night with only occasional good readings after supper This is despite increasing his U-500 insulin by 2 units on the last visit and taking one to 2 units more before higher readings. He does have a relatively high fat diet frequently He has done better with keeping his insulin refrigerated but once had a reading of 400 because he did not take insulin when eating out     Oral hypoglycemic drugs: none     Side effects from medications: None Insulin regimen: U-500 insulin: 8 units for breakfast, 12 before lunch, 14 before supper, 30 minutes a.c. Lantus 60--40       Proper timing of medications in relation to meals: Yes.          Monitors blood glucose: Once a day.    Glucometer:  Accu-Chek     Blood Glucose readings from meter download:  PREMEAL Breakfast Lunch Dinner  Bedtime Overall  Glucose range:  93-140   88-394   190-327   126-354    Mean/median:      241    POST-MEAL PC Breakfast PC Lunch PC Dinner  Glucose range:  162    132-397   Mean/median:        Hypoglycemia: None recently   CGM RECORD INTERPRETATION    Dates of Recording: 07/04/13 through 07/06/13        Indications: Poor glycemic control. Variable blood sugars and unpredictable hyperglycemia and occasional hypoglycemia. Identification of postprandial patterns and patterns of overnight glycemia     Sensor  summary:  Quality of the data is excellent with adequate sensor function. Glucose excursion profile shows a total of 5 high excursions and no low excursions. Data available for 3 days between 12/11 and 12/13 and subsequently there was no contact of the sensor      Glycemic patterns:   Has consistently high blood sugars starting around 6 PM, peaking around 10 PM and gradually decreasing through the night. Near-normal blood sugars between about 9 AM-1 PM      Overnight periods:   blood sugars are high with readings averaging around 300 at midnight and gradually declining until 8 AM when it is down to 140.      Preprandial periods:   fairly good readings before about noon, one high reading on 12/11 and 12/13 at suppertime otherwise better reading at suppertime on 12/12  Postprandial periods:   on the CGM blood sugar was higher on 12/13 after lunch and also much higher after supper on 12/12. His lunch on 12/13 was a hamburger. Evening meal on 12/11 and 12/12 were also high fat      Hypoglycemia:  none      Recommendations  increase mealtime coverage at least at suppertime and probably at lunchtime also based on type of meal     Meals: 3 meals per day.eating generally at  1 pm and 6-7 pm. Frequently high fat with fatty meats regularly   Last dietitian visit: 6/14     Physical activity: exercise: walking and yardwork            Wt Readings from Last 3 Encounters:  07/10/13 317 lb  1.6 oz (143.836 kg)  05/23/13 307 lb 12.8 oz (139.617 kg)  12/28/09 324 lb (146.965 kg)    LABS:  Lab Results  Component Value Date   HGBA1C 14.3* 05/20/2013   Lab Results  Component Value Date   MICROALBUR 21.7* 05/20/2013   CREATININE 1.4 05/20/2013    No visits with results within 1 Week(s) from this visit. Latest known visit with results is:  Lab on 05/20/2013  Component Date Value Range Status  . Microalb, Ur 05/20/2013 21.7* 0.0 - 1.9 mg/dL Final  . Creatinine,U 05/20/2013 163.9   Final  . Microalb Creat Ratio 05/20/2013 13.2  0.0 - 30.0 mg/g Final  . Cholesterol 05/20/2013 150  0 - 200 mg/dL Final   ATP III Classification       Desirable:  < 200 mg/dL               Borderline High:  200 - 239 mg/dL          High:  > = 240 mg/dL  . Triglycerides 05/20/2013 265.0* 0.0 - 149.0 mg/dL Final   Normal:  <150 mg/dLBorderline High:  150 - 199 mg/dL  . HDL 05/20/2013 37.30* >39.00 mg/dL Final  . VLDL 05/20/2013 53.0* 0.0 - 40.0 mg/dL Final  . Total CHOL/HDL Ratio 05/20/2013 4   Final                  Men          Women1/2 Average Risk     3.4          3.3Average Risk          5.0          4.42X Average Risk          9.6          7.13X Average Risk          15.0          11.0                      . Hemoglobin A1C 05/20/2013 14.3* 4.6 - 6.5 % Final   Glycemic Control Guidelines for People with Diabetes:Non Diabetic:  <6%Goal of Therapy: <7%Additional Action Suggested:  >8%   . Sodium 05/20/2013 139  135 - 145 mEq/L Final  . Potassium 05/20/2013 3.8  3.5 - 5.1 mEq/L Final  . Chloride 05/20/2013 99  96 - 112 mEq/L Final  . CO2 05/20/2013 30  19 - 32 mEq/L Final  . Glucose, Bld 05/20/2013 81  70 - 99 mg/dL Final  . BUN 05/20/2013 20  6 - 23 mg/dL Final  . Creatinine, Ser 05/20/2013 1.4  0.4 - 1.5 mg/dL Final  .  Total Bilirubin 05/20/2013 0.7  0.3 - 1.2 mg/dL Final  . Alkaline Phosphatase 05/20/2013 119* 39 - 117 U/L Final  . AST 05/20/2013 28  0 - 37 U/L Final  . ALT 05/20/2013  33  0 - 53 U/L Final  . Total Protein 05/20/2013 7.9  6.0 - 8.3 g/dL Final  . Albumin 05/20/2013 4.0  3.5 - 5.2 g/dL Final  . Calcium 05/20/2013 9.7  8.4 - 10.5 mg/dL Final  . GFR 05/20/2013 55.62* >60.00 mL/min Final  . Color, Urine 05/20/2013 LT. YELLOW  Yellow;Lt. Yellow Final  . APPearance 05/20/2013 CLEAR  Clear Final  . Specific Gravity, Urine 05/20/2013 1.025  1.000-1.030 Final  . pH 05/20/2013 6.0  5.0 - 8.0 Final  . Total Protein, Urine 05/20/2013 30  Negative Final  . Urine Glucose 05/20/2013 NEGATIVE  Negative Final  . Ketones, ur 05/20/2013 NEGATIVE  Negative Final  . Bilirubin Urine 05/20/2013 NEGATIVE  Negative Final  . Hgb urine dipstick 05/20/2013 SMALL  Negative Final  . Urobilinogen, UA 05/20/2013 0.2  0.0 - 1.0 Final  . Leukocytes, UA 05/20/2013 NEGATIVE  Negative Final  . Nitrite 05/20/2013 NEGATIVE  Negative Final  . WBC, UA 05/20/2013 0-2/hpf  0-2/hpf Final  . RBC / HPF 05/20/2013 3-6/hpf  0-2/hpf Final  . Mucus, UA 05/20/2013 Presence of  None Final  . Squamous Epithelial / LPF 05/20/2013 Rare(0-4/hpf)  Rare(0-4/hpf) Final  . Granular Casts, UA 05/20/2013 Presence of  None Final  . Direct LDL 05/20/2013 77.9   Final   Optimal:  <100 mg/dLNear or Above Optimal:  100-129 mg/dLBorderline High:  130-159 mg/dLHigh:  160-189 mg/dLVery High:  >190 mg/dL      Medication List       This list is accurate as of: 07/10/13 10:03 AM.  Always use your most recent med list.               amLODipine 10 MG tablet  Commonly known as:  NORVASC  Take 10 mg by mouth daily.     aspirin 81 MG EC tablet  Take 81 mg by mouth daily.     atenolol 50 MG tablet  Commonly known as:  TENORMIN  Take 50 mg by mouth daily.     atorvastatin 80 MG tablet  Commonly known as:  LIPITOR  Take 80 mg by mouth daily.     DULoxetine 60 MG capsule  Commonly known as:  CYMBALTA  Take 60 mg by mouth daily.     ergocalciferol 50000 UNITS capsule  Commonly known as:  VITAMIN D2  Take  50,000 Units by mouth once a week.     fenofibrate 145 MG tablet  Commonly known as:  TRICOR  Take 145 mg by mouth daily.     Fish Oil 1000 MG Caps  Take 1,000 mg by mouth 2 (two) times daily.     FLUoxetine 20 MG capsule  Commonly known as:  PROZAC  Take 20 mg by mouth daily.     furosemide 80 MG tablet  Commonly known as:  LASIX  Take 80 mg by mouth daily.     gabapentin 300 MG capsule  Commonly known as:  NEURONTIN  Take 300 mg by mouth 3 (three) times daily.     HUMULIN R 500 UNIT/ML Soln injection  Generic drug:  insulin regular human CONCENTRATED  AS DIRECTED UP TO 35 UNITS 3 TIMES DAILY.     HUMULIN R U-500 (CONCENTRATED) [place]  Inject into the skin. Takes 8 units in  am and 12 units in the afternoon     HYDROcodone-acetaminophen 7.5-500 MG per tablet  Commonly known as:  LORTAB  Take 1 tablet by mouth 3 (three) times daily.     LANTUS OPTICLIK 100 UNIT/ML Soct  Generic drug:  Insulin Glargine  Inject into the skin. Takes 60 units twice a day     NON FORMULARY  C-PAP Machine. Use as directed at bedtime.     potassium chloride 10 MEQ tablet  Commonly known as:  K-DUR  Take 10 mEq by mouth 2 (two) times daily.     pravastatin 20 MG tablet  Commonly known as:  PRAVACHOL  Take 20 mg by mouth at bedtime.     VICTOZA 18 MG/3ML Sopn  Generic drug:  Liraglutide  Inject 1.2 mg into the skin.     zolpidem 10 MG tablet  Commonly known as:  AMBIEN  Take 10 mg by mouth at bedtime as needed for sleep.        Allergies: No Known Allergies  Past Medical History  Diagnosis Date  . Sleep apnea     Dr. Brandon Melnick  . Edema     Venous insufficiency  . Proteinuria   . Obesity   . Diabetes mellitus   . Hypertension     Benign  . Precordial pain 12/23/09    Nuclear stress; no ischemia; EF 60%  . Hyperlipidemia   . History of echocardiogram 12/09    EF 60-65%; mild increased RV; 38 mmHg    Past Surgical History  Procedure Laterality Date  . Resection bone tumor  femur  1980's    Left femur, treated at [place] with bone graft    Family History  Problem Relation Age of Onset  . Heart attack Mother 82  . Cancer Mother     Breast  . Heart attack Father 43  . Cancer Sister     Breast  . Cancer Sister     Colon    Social History:  reports that he has quit smoking. He has never used smokeless tobacco. His alcohol and drug histories are not on file.  Review of Systems:  Hypertension: currently on atenolol, HCTZ, Lasix and blood pressure is  normal today No history of edema recently  Lipids: triglycerides are still relatively high despite taking fenofibrate. LDL is below 100, taking pravastatin  Lab Results  Component Value Date   CHOL 150 05/20/2013   HDL 37.30* 05/20/2013   LDLDIRECT 77.9 05/20/2013   TRIG 265.0* 05/20/2013   CHOLHDL 4 05/20/2013      CKD: His creatinine has ranged from 1.6-2.1 in the last 2 years but was only 1.4 on his last visit   Examination:   BP 128/76  Pulse 60  Temp(Src) 98.3 F (36.8 C)  Resp 12  Ht 5\' 9"  (1.753 m)  Wt 317 lb 1.6 oz (143.836 kg)  BMI 46.81 kg/m2  SpO2 95%  Body mass index is 46.81 kg/(m^2).    ASSESSMENT/ PLAN::   Diabetes type 2   Blood glucose control appears poor because of persistently high blood sugars after his main meals at lunch and supper See history of present illness for detailed analysis of his blood sugar patterns and continuous glucose monitoring His morning sugars are fairly consistent now despite using less Lantus in the evening but evening and postprandial readings are significantly high, frequently over 300 Some of his high readings are related to his very high fat meals at times and also has not been  able to lose weight Last A1c  was 14%. Still requiring large doses of insulin He  appears to be  compliant with his insulin as directed  HYPERLIPIDEMIA: LDL is controlled, triglycerides relatively higher from poor diabetes control and marked obesity  PLAN:  Will  increase his U-500 insulin by at  2-4 units with lunch and dinner and he will take 16 at lunch and 18 before supper To avoid overnight hypoglycemia was reduce his evening Lantus to at least 35 He will call in readings for review at next week May consider changing back to rapid acting insulin if blood sugars are still high postprandially, however since blood sugars are not rising right after eating and he has a high fat meals frequently will leave him on U-500 insulin He has been seen by the diabetes nurse educator for general education especially meal planning, timing of insulin and insulin adjustment  Counseling time over 50% of today's 25 minute visit  Ethan Campbell 07/10/2013, 10:03 AM

## 2013-07-27 ENCOUNTER — Other Ambulatory Visit: Payer: Self-pay | Admitting: Endocrinology

## 2013-08-21 ENCOUNTER — Encounter: Payer: Self-pay | Admitting: Endocrinology

## 2013-08-21 ENCOUNTER — Other Ambulatory Visit (INDEPENDENT_AMBULATORY_CARE_PROVIDER_SITE_OTHER): Payer: Medicaid Other

## 2013-08-21 ENCOUNTER — Ambulatory Visit (INDEPENDENT_AMBULATORY_CARE_PROVIDER_SITE_OTHER): Payer: Medicaid Other | Admitting: Endocrinology

## 2013-08-21 VITALS — BP 112/66 | HR 75 | Temp 98.3°F | Resp 14 | Ht 69.0 in | Wt 310.6 lb

## 2013-08-21 DIAGNOSIS — E1165 Type 2 diabetes mellitus with hyperglycemia: Principal | ICD-10-CM

## 2013-08-21 DIAGNOSIS — IMO0001 Reserved for inherently not codable concepts without codable children: Secondary | ICD-10-CM

## 2013-08-21 DIAGNOSIS — N183 Chronic kidney disease, stage 3 unspecified: Secondary | ICD-10-CM

## 2013-08-21 LAB — COMPREHENSIVE METABOLIC PANEL
ALT: 23 U/L (ref 0–53)
AST: 14 U/L (ref 0–37)
Albumin: 4 g/dL (ref 3.5–5.2)
Alkaline Phosphatase: 167 U/L — ABNORMAL HIGH (ref 39–117)
BILIRUBIN TOTAL: 0.7 mg/dL (ref 0.3–1.2)
BUN: 25 mg/dL — ABNORMAL HIGH (ref 6–23)
CO2: 29 mEq/L (ref 19–32)
CREATININE: 1.9 mg/dL — AB (ref 0.4–1.5)
Calcium: 10.5 mg/dL (ref 8.4–10.5)
Chloride: 90 mEq/L — ABNORMAL LOW (ref 96–112)
GFR: 40.44 mL/min — ABNORMAL LOW (ref >60.00)
Glucose, Bld: 416 mg/dL — ABNORMAL HIGH (ref 70–99)
Potassium: 3.7 mEq/L (ref 3.5–5.1)
Sodium: 129 mEq/L — ABNORMAL LOW (ref 135–145)
Total Protein: 8.1 g/dL (ref 6.0–8.3)

## 2013-08-21 LAB — HEMOGLOBIN A1C: HEMOGLOBIN A1C: 12.6 % — AB (ref 4.6–6.5)

## 2013-08-21 NOTE — Patient Instructions (Addendum)
Supper dose: 14 units with 1 starch, 17 for 2 starches and 20 units for 3  Starches or high fat meal  12 at Bfst and 15 lunch as before. Call if frequent lows  Sugar <120 at bedtime, have a snack with protein

## 2013-08-21 NOTE — Progress Notes (Signed)
Patient ID: Ethan Campbell, male   DOB: 12-Aug-1960, 53 y.o.   MRN: [id number]   Reason for Appointment: Diabetes follow-up   History of Present Illness   Diagnosis: Type 2 DIABETES MELITUS, date of diagnosis: 2000    Previous history: he has been on insulin for several years with consistently poor control Has been requiring large doses of insulin for his diabetes but A1c has been persistently high Blood sugars did not improve significantly even with trying Byetta and Victoza In 2014 he was switched from NovoLog to U-500 insulin but not clear if he has had improvement in control except with fasting readings His prior A1c was 14.0 in 5/14 and he had educational discussions with diabetes educator and dietitian in 6/14   Recent history:  A1c has been persistently markedly increased despite using high dose insulin and also Victoza Based on results of his continuous glucose monitoring his U-500 insulin was increased further because of high readings in the evenings He is recently checking his blood sugars m at various times but still only about once a day on an average He is trying to eat breakfast more often now and will take his mealtime insulin with this also Now his blood sugar patterns show moderate variability in blood sugars at all times More recently his blood sugars tend to be somewhat better in the mornings but sporadically higher in the afternoons and evenings and occasionally very high late at night. His blood sugar was over 500 this morning but he has had a respiratory infection recently However now he is having sporadic low blood sugars anywhere between 4 PM and 4 AM, usually about once a week     Oral hypoglycemic drugs: none  (renal dysfunction)     Side effects from medications: None Insulin regimen: U-500 insulin: 8 units for breakfast, 15 before lunch, 15-16 before supper, 30 minutes a.c.  Lantus insulin:  60 a.m.--40 40 p.m.        Proper timing of medications in relation to  meals: Yes.    Meals: 3 meals per day.eating generally at  1 pm and 6-7 pm. Occasionally high fat meals       Monitors blood glucose: Once a day.    Glucometer:  Accu-Chek     Blood Glucose readings from meter download:  PREMEAL Breakfast Lunch Dinner Bedtime  overall   Glucose range:  146-500+   184-238   70   136-460    Mean/median:      230    POST-MEAL PC Breakfast PC Lunch PC Dinner  Glucose range: ?   42-262   58-508   Mean/median:      Last dietitian visit: 6/14     Physical activity: exercise: Less recently        Wt Readings from Last 3 Encounters:  08/21/13 310 lb 9.6 oz (140.887 kg)  07/10/13 317 lb 1.6 oz (143.836 kg)  05/23/13 307 lb 12.8 oz (139.617 kg)    LABS:  Lab Results  Component Value Date   HGBA1C 14.3* 05/20/2013   Lab Results  Component Value Date   MICROALBUR 21.7* 05/20/2013   CREATININE 1.7* 07/10/2013    No visits with results within 1 Week(s) from this visit. Latest known visit with results is:  Office Visit on 07/10/2013  Component Date Value Range Status  . Sodium 07/10/2013 140  135 - 145 mEq/L Final  . Potassium 07/10/2013 3.5  3.5 - 5.1 mEq/L Final  . Chloride 07/10/2013 99  96 -  112 mEq/L Final  . CO2 07/10/2013 32  19 - 32 mEq/L Final  . Glucose, Bld 07/10/2013 59* 70 - 99 mg/dL Final  . BUN 07/10/2013 29* 6 - 23 mg/dL Final  . Creatinine, Ser 07/10/2013 1.7* 0.4 - 1.5 mg/dL Final  . Calcium 07/10/2013 9.8  8.4 - 10.5 mg/dL Final  . GFR 07/10/2013 44.26* >60.00 mL/min Final  . Fructosamine 07/10/2013 387* <285 umol/L Final   Comment:                            Variations in levels of serum proteins (albumin and immunoglobulins)                          may affect fructosamine results.                                 Medication List       This list is accurate as of: 08/21/13  1:56 PM.  Always use your most recent med list.               amLODipine 10 MG tablet  Commonly known as:  NORVASC  Take 10 mg by mouth  daily.     aspirin 81 MG EC tablet  Take 81 mg by mouth daily.     atenolol 50 MG tablet  Commonly known as:  TENORMIN  Take 50 mg by mouth daily.     atorvastatin 80 MG tablet  Commonly known as:  LIPITOR  Take 80 mg by mouth daily.     DULoxetine 60 MG capsule  Commonly known as:  CYMBALTA  Take 60 mg by mouth daily.     ergocalciferol 50000 UNITS capsule  Commonly known as:  VITAMIN D2  Take 50,000 Units by mouth once a week.     fenofibrate 145 MG tablet  Commonly known as:  TRICOR  Take 145 mg by mouth daily.     Fish Oil 1000 MG Caps  Take 1,000 mg by mouth 2 (two) times daily.     FLUoxetine 20 MG capsule  Commonly known as:  PROZAC  Take 20 mg by mouth daily.     furosemide 80 MG tablet  Commonly known as:  LASIX  Take 80 mg by mouth daily.     gabapentin 300 MG capsule  Commonly known as:  NEURONTIN  Take 300 mg by mouth 3 (three) times daily.     HUMULIN R 500 UNIT/ML Soln injection  Generic drug:  insulin regular human CONCENTRATED  AS DIRECTED UP TO 35 UNITS 3 TIMES DAILY.     HUMULIN R U-500 (CONCENTRATED) [place]  Inject into the skin. Takes 8 units in am and 12 units in the afternoon     HYDROcodone-acetaminophen 7.5-500 MG per tablet  Commonly known as:  LORTAB  Take 1 tablet by mouth 3 (three) times daily.     LANTUS OPTICLIK 100 UNIT/ML cartridge  Generic drug:  Insulin Glargine  Inject into the skin. Takes 60 units twice a day     NON FORMULARY  C-PAP Machine. Use as directed at bedtime.     potassium chloride 10 MEQ tablet  Commonly known as:  K-DUR  TAKE 2 TABLETS BY MOUTH ONCE DAILY.     pravastatin 20 MG tablet  Commonly known as:  PRAVACHOL  Take 20 mg by  mouth at bedtime.     VICTOZA 18 MG/3ML Sopn  Generic drug:  Liraglutide  INJECT 1.2 MG SUBCUTANEOUSLY ONCE DAILY.     zolpidem 10 MG tablet  Commonly known as:  AMBIEN  Take 10 mg by mouth at bedtime as needed for sleep.        Allergies: No Known Allergies  Past  Medical History  Diagnosis Date  . Sleep apnea     Dr. Brandon Melnick  . Edema     Venous insufficiency  . Proteinuria   . Obesity   . Diabetes mellitus   . Hypertension     Benign  . Precordial pain 12/23/09    Nuclear stress; no ischemia; EF 60%  . Hyperlipidemia   . History of echocardiogram 12/09    EF 60-65%; mild increased RV; 38 mmHg    Past Surgical History  Procedure Laterality Date  . Resection bone tumor femur  1980's    Left femur, treated at [place] with bone graft    Family History  Problem Relation Age of Onset  . Heart attack Mother 4  . Cancer Mother     Breast  . Heart attack Father 51  . Cancer Sister     Breast  . Cancer Sister     Colon    Social History:  reports that he has quit smoking. He has never used smokeless tobacco. His alcohol and drug histories are not on file.  Review of Systems:  Hypertension: currently on atenolol, HCTZ, Lasix and blood pressure is  normal today No history of edema recently  Lipids: triglycerides are still relatively high despite taking fenofibrate. LDL is below 100, taking pravastatin  Lab Results  Component Value Date   CHOL 150 05/20/2013   HDL 37.30* 05/20/2013   LDLDIRECT 77.9 05/20/2013   TRIG 265.0* 05/20/2013   CHOLHDL 4 05/20/2013      CKD: His creatinine has ranged from 1.6-2.1 in the last 2 years but was only 1.4 on his last visit   Examination:   BP 112/66  Pulse 75  Temp(Src) 98.3 F (36.8 C)  Resp 14  Ht 5\' 9"  (1.753 m)  Wt 310 lb 9.6 oz (140.887 kg)  BMI 45.85 kg/m2  SpO2 97%  Body mass index is 45.85 kg/(m^2).    ASSESSMENT/ PLAN::   Diabetes type 2   Blood glucose control is still inadequate marked fluctuation in blood sugars at all different times See history of present illness for detailed analysis of his blood sugar patterns and day-to-day management He still tends to have overall high readings late in the evening despite increasing his mealtime coverage but this is not  consistent Also recently has had occasional hypoglycemia at various times except in the mornings His average blood sugar is still well over 200 at home.  PLAN:  With increased his coverage at breakfast since blood sugars are relatively higher in the afternoons Also will need to adjust his supper time U-500 dose based on his carbohydrate content   He will continue same dose of Lantus Advised him to take extra 2-5 units U-500 insulin when his blood sugars are higher including today when he is having hyperglycemia from respiratory illness   Insulin Changes:  Supper dose: 12  unitsat Bfst and 15 lunch as before. 14 units with 1 starch, 17 for 2 starches and 20 units for 3  Starches or high fat meal  Call if  getting frequent lows When the sugar is <120 at bedtime, have a  snack with protein  Consider changing back to NovoLog to help with mealtime control  Counseling time over 50% of today's 25 minute visit  Avrie Kedzierski 08/21/2013, 1:56 PM    A1c slightly better:  Appointment on 08/21/2013  Component Date Value Range Status  . Hemoglobin A1C 08/21/2013 12.6* 4.6 - 6.5 % Final   Glycemic Control Guidelines for People with Diabetes:Non Diabetic:  <6%Goal of Therapy: <7%Additional Action Suggested:  >8%   . Sodium 08/21/2013 129* 135 - 145 mEq/L Final  . Potassium 08/21/2013 3.7  3.5 - 5.1 mEq/L Final  . Chloride 08/21/2013 90* 96 - 112 mEq/L Final  . CO2 08/21/2013 29  19 - 32 mEq/L Final  . Glucose, Bld 08/21/2013 416* 70 - 99 mg/dL Final  . BUN 08/21/2013 25* 6 - 23 mg/dL Final  . Creatinine, Ser 08/21/2013 1.9* 0.4 - 1.5 mg/dL Final  . Total Bilirubin 08/21/2013 0.7  0.3 - 1.2 mg/dL Final  . Alkaline Phosphatase 08/21/2013 167* 39 - 117 U/L Final  . AST 08/21/2013 14  0 - 37 U/L Final  . ALT 08/21/2013 23  0 - 53 U/L Final  . Total Protein 08/21/2013 8.1  6.0 - 8.3 g/dL Final  . Albumin 08/21/2013 4.0  3.5 - 5.2 g/dL Final  . Calcium 08/21/2013 10.5  8.4 - 10.5 mg/dL Final  . GFR  08/21/2013 40.44* >60.00 mL/min Final

## 2013-08-22 DIAGNOSIS — N179 Acute kidney failure, unspecified: Secondary | ICD-10-CM | POA: Insufficient documentation

## 2013-08-22 DIAGNOSIS — N183 Chronic kidney disease, stage 3 unspecified: Secondary | ICD-10-CM | POA: Insufficient documentation

## 2013-09-05 ENCOUNTER — Other Ambulatory Visit: Payer: Self-pay | Admitting: Endocrinology

## 2013-10-18 ENCOUNTER — Ambulatory Visit: Payer: Medicaid Other | Admitting: Endocrinology

## 2013-10-18 ENCOUNTER — Other Ambulatory Visit: Payer: Medicaid Other

## 2013-11-14 ENCOUNTER — Encounter: Payer: Self-pay | Admitting: Endocrinology

## 2013-11-14 ENCOUNTER — Ambulatory Visit (INDEPENDENT_AMBULATORY_CARE_PROVIDER_SITE_OTHER): Payer: Medicaid Other | Admitting: Endocrinology

## 2013-11-14 ENCOUNTER — Telehealth: Payer: Self-pay | Admitting: Endocrinology

## 2013-11-14 ENCOUNTER — Other Ambulatory Visit (INDEPENDENT_AMBULATORY_CARE_PROVIDER_SITE_OTHER): Payer: Medicaid Other

## 2013-11-14 VITALS — BP 130/80 | HR 57 | Temp 98.2°F | Resp 16 | Ht 69.0 in | Wt 314.2 lb

## 2013-11-14 DIAGNOSIS — IMO0001 Reserved for inherently not codable concepts without codable children: Secondary | ICD-10-CM

## 2013-11-14 DIAGNOSIS — E782 Mixed hyperlipidemia: Secondary | ICD-10-CM

## 2013-11-14 DIAGNOSIS — E1165 Type 2 diabetes mellitus with hyperglycemia: Principal | ICD-10-CM

## 2013-11-14 DIAGNOSIS — N183 Chronic kidney disease, stage 3 unspecified: Secondary | ICD-10-CM

## 2013-11-14 LAB — URINALYSIS, ROUTINE W REFLEX MICROSCOPIC
Bilirubin Urine: NEGATIVE
KETONES UR: NEGATIVE
LEUKOCYTES UA: NEGATIVE
Nitrite: NEGATIVE
PH: 6 (ref 5.0–8.0)
SPECIFIC GRAVITY, URINE: 1.025 (ref 1.000–1.030)
TOTAL PROTEIN, URINE-UPE24: NEGATIVE
Urine Glucose: 500 — AB
Urobilinogen, UA: 0.2 (ref 0.0–1.0)

## 2013-11-14 LAB — BASIC METABOLIC PANEL
BUN: 26 mg/dL — AB (ref 6–23)
CALCIUM: 9.4 mg/dL (ref 8.4–10.5)
CO2: 26 meq/L (ref 19–32)
Chloride: 100 mEq/L (ref 96–112)
Creatinine, Ser: 1.9 mg/dL — ABNORMAL HIGH (ref 0.4–1.5)
GFR: 39.19 mL/min — ABNORMAL LOW (ref >60.00)
GLUCOSE: 229 mg/dL — AB (ref 70–99)
Potassium: 3.5 mEq/L (ref 3.5–5.1)
SODIUM: 135 meq/L (ref 135–145)

## 2013-11-14 LAB — MICROALBUMIN / CREATININE URINE RATIO
Creatinine,U: 129.3 mg/dL
MICROALB/CREAT RATIO: 4 mg/g (ref 0.0–30.0)
Microalb, Ur: 5.2 mg/dL — ABNORMAL HIGH (ref 0.0–1.9)

## 2013-11-14 NOTE — Telephone Encounter (Signed)
Noted, left message on patients vm

## 2013-11-14 NOTE — Telephone Encounter (Signed)
Patient called back and states that he would like to be referred to Dr. Roy(neurologist) in [place]

## 2013-11-14 NOTE — Telephone Encounter (Signed)
His pcp will need to do out of town ones

## 2013-11-14 NOTE — Telephone Encounter (Signed)
Please see below.

## 2013-11-14 NOTE — Patient Instructions (Signed)
U-500 insulin: 12-18  units for breakfast, 18 before lunch, 20 before supper, 30 minutes before eating  Extra for high as before Lantus insulin: 60 a.m.--60 p.m.

## 2013-11-14 NOTE — Progress Notes (Signed)
Patient ID: Ethan Campbell, male   DOB: 14-Sep-1960, 53 y.o.   MRN: [id number]   Reason for Appointment: Diabetes follow-up   History of Present Illness   Diagnosis: Type 2 DIABETES MELITUS, date of diagnosis: 2000    Previous history: he has been on insulin for several years with consistently poor control Has been requiring large doses of insulin for his diabetes but A1c has been persistently high Blood sugars did not improve significantly even with trying Byetta and Victoza In 2014 he was switched from NovoLog to U-500 insulin but not clear if he has had improvement in control except with fasting readings His prior A1c was 14.0 in 5/14 and he had educational discussions with diabetes educator and dietitian in 6/14   Recent history: His blood sugars have been persistently markedly increased despite using high doses of insulin and also Victoza Even though his U-500 insulin was increased on the last visit his overall readings are still high For some reason his fasting readings are more consistently high and averaging well over 300 Still has some variability in blood sugars later in the day with periodic low normal readings He does take some extra U-500 insulin as directed when his blood sugars are high He thinks he is compliant with all his insulin doses Has only sporadic low sugars which are unpredictable     Oral hypoglycemic drugs: none  (renal dysfunction)     Side effects from medications: None Insulin regimen: U-500 insulin: 12 units for breakfast, 15 before lunch, 18 before supper, 30 minutes a.c.  Lantus insulin:  60 a.m.-- 40 p.m.        Proper timing of medications in relation to meals: Yes.    Meals: 3 meals per day.eating generally at  1 pm and 6-7 pm. Occasionally high fat meals       Monitors blood glucose: Once a day.    Glucometer:  Accu-Chek     Blood Glucose readings from meter download:  PREMEAL Breakfast Lunch Dinner Bedtime Overall  Glucose range: 211-436 54-381  71-404 54-569   Mean/median: 350 244 196 311    Last dietitian visit: 6/14     Physical activity: exercise: some yardwork 2-3/7     Wt Readings from Last 3 Encounters:  11/14/13 314 lb 3.2 oz (142.52 kg)  08/21/13 310 lb 9.6 oz (140.887 kg)  07/10/13 317 lb 1.6 oz (143.836 kg)    LABS:  Lab Results  Component Value Date   HGBA1C 12.6* 08/21/2013   HGBA1C 14.3* 05/20/2013   Lab Results  Component Value Date   MICROALBUR 21.7* 05/20/2013   CREATININE 1.9* 08/21/2013    No visits with results within 1 Week(s) from this visit. Latest known visit with results is:  Appointment on 08/21/2013  Component Date Value Ref Range Status  . Hemoglobin A1C 08/21/2013 12.6* 4.6 - 6.5 % Final   Glycemic Control Guidelines for People with Diabetes:Non Diabetic:  <6%Goal of Therapy: <7%Additional Action Suggested:  >8%   . Sodium 08/21/2013 129* 135 - 145 mEq/L Final  . Potassium 08/21/2013 3.7  3.5 - 5.1 mEq/L Final  . Chloride 08/21/2013 90* 96 - 112 mEq/L Final  . CO2 08/21/2013 29  19 - 32 mEq/L Final  . Glucose, Bld 08/21/2013 416* 70 - 99 mg/dL Final  . BUN 08/21/2013 25* 6 - 23 mg/dL Final  . Creatinine, Ser 08/21/2013 1.9* 0.4 - 1.5 mg/dL Final  . Total Bilirubin 08/21/2013 0.7  0.3 - 1.2 mg/dL Final  . Alkaline  Phosphatase 08/21/2013 167* 39 - 117 U/L Final  . AST 08/21/2013 14  0 - 37 U/L Final  . ALT 08/21/2013 23  0 - 53 U/L Final  . Total Protein 08/21/2013 8.1  6.0 - 8.3 g/dL Final  . Albumin 08/21/2013 4.0  3.5 - 5.2 g/dL Final  . Calcium 08/21/2013 10.5  8.4 - 10.5 mg/dL Final  . GFR 08/21/2013 40.44* >60.00 mL/min Final      Medication List       This list is accurate as of: 11/14/13 11:12 AM.  Always use your most recent med list.               amLODipine 10 MG tablet  Commonly known as:  NORVASC  Take 10 mg by mouth daily.     aspirin 81 MG EC tablet  Take 81 mg by mouth daily.     atenolol 50 MG tablet  Commonly known as:  TENORMIN  Take 50 mg by  mouth daily.     atorvastatin 80 MG tablet  Commonly known as:  LIPITOR  TAKE 1 TABLET ONCE DAILY.     DULoxetine 60 MG capsule  Commonly known as:  CYMBALTA  Take 60 mg by mouth daily.     ergocalciferol 50000 UNITS capsule  Commonly known as:  VITAMIN D2  Take 50,000 Units by mouth once a week.     fenofibrate 145 MG tablet  Commonly known as:  TRICOR  Take 145 mg by mouth daily.     Fish Oil 1000 MG Caps  Take 1,000 mg by mouth 2 (two) times daily.     FLUoxetine 20 MG capsule  Commonly known as:  PROZAC  Take 20 mg by mouth daily.     furosemide 80 MG tablet  Commonly known as:  LASIX  Take 80 mg by mouth daily.     gabapentin 300 MG capsule  Commonly known as:  NEURONTIN  Take 300 mg by mouth 3 (three) times daily.     HUMULIN R 500 UNIT/ML Soln injection  Generic drug:  insulin regular human CONCENTRATED  AS DIRECTED UP TO 35 UNITS 3 TIMES DAILY.     HUMULIN R U-500 (CONCENTRATED) [place]  Inject into the skin. 15 lunch, 16 at supper     HYDROcodone-acetaminophen 7.5-500 MG per tablet  Commonly known as:  LORTAB  Take 1 tablet by mouth 3 (three) times daily.     LANTUS OPTICLIK 100 UNIT/ML cartridge  Generic drug:  Insulin Glargine  Inject into the skin. Takes 60 units am and 40 in pm     NON FORMULARY  C-PAP Machine. Use as directed at bedtime.     potassium chloride 10 MEQ tablet  Commonly known as:  K-DUR  TAKE 2 TABLETS BY MOUTH ONCE DAILY.     pravastatin 20 MG tablet  Commonly known as:  PRAVACHOL  Take 20 mg by mouth at bedtime.     VICTOZA 18 MG/3ML Sopn  Generic drug:  Liraglutide  INJECT 1.2 MG SUBCUTANEOUSLY ONCE DAILY.     zolpidem 10 MG tablet  Commonly known as:  AMBIEN  Take 10 mg by mouth at bedtime as needed for sleep.        Allergies: No Known Allergies  Past Medical History  Diagnosis Date  . Sleep apnea     Dr. Brandon Melnick  . Edema     Venous insufficiency  . Proteinuria   . Obesity   . Diabetes mellitus   .  Hypertension     Benign  . Precordial pain 12/23/09    Nuclear stress; no ischemia; EF 60%  . Hyperlipidemia   . History of echocardiogram 12/09    EF 60-65%; mild increased RV; 38 mmHg    Past Surgical History  Procedure Laterality Date  . 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 Heart attack Mother 34  . Cancer Mother     Breast  . Heart attack Father 69  . Cancer Sister     Breast  . Cancer Sister     Colon    Social History:  reports that he has quit smoking. He has never used smokeless tobacco. His alcohol and drug histories are not on file.  Review of Systems:  Blackout episodes with leg weakness. This occurs for only a minute or so and during this time he cannot hear or talk but his vision is fairly good; it is followed by headache. BP and glucoseduring the episode have been checked by his wife and were normal. Getting episodes 3 to 4x per month and has discussed with PCP already  Hypertension: currently on atenolol, HCTZ, Lasix and blood pressure is  normal today No history of edema recently  Lipids: triglycerides are still relatively high despite taking fenofibrate. LDL is below 100, taking pravastatin  Lab Results  Component Value Date   CHOL 150 05/20/2013   HDL 37.30* 05/20/2013   LDLDIRECT 77.9 05/20/2013   TRIG 265.0* 05/20/2013   CHOLHDL 4 05/20/2013      CKD: His creatinine has ranged from 1.6-2.1 in the last 2 years but was 1.9 recently   Examination:   BP 124/70  Pulse 57  Temp(Src) 98.2 F (36.8 C)  Resp 16  Ht 5\' 9"  (1.753 m)  Wt 314 lb 3.2 oz (142.52 kg)  BMI 46.38 kg/m2  SpO2 95%  Body mass index is 46.38 kg/(m^2).    ASSESSMENT/ PLAN::   Diabetes type 2   Blood glucose control is still inadequate along with some fluctuation in blood sugars especially later in the day For some reason his fasting readings are more consistently high and  this is despite using Lantus twice a day and relatively large doses of U-500 at suppertime See history of present illness for detailed analysis of his blood sugar patterns and day-to-day management He is not a candidate for Invokana because of his renal dysfunction Also not clear if he has coverage for an insulin pump because of his insurance, no recent C-peptide levels available  PLAN:  With increased his U 500 insulin at lunch and supper Also increase evening Lantus to 60 units Discuss treatment options including insulin pump although this may be difficult because of his high insulin requirement Will need to check his A1c on the next visit   Insulin Changes:  Lantus 60 twice a day U-500 insulin: 12-18  units for breakfast, 18 before lunch, 20 before supper, 30 minutes before eating  Blackout episode: Maybe partial seizures and he will need to see a neurologist  Counseling time over 50% of today's 25 minute visit  Elayne Snare 11/14/2013, 11:12 AM    A1c slightly better:  No visits with results within 1 Week(s) from this visit. Latest known visit with results is:  Appointment on 08/21/2013  Component Date Value Ref Range Status  . Hemoglobin A1C 08/21/2013 12.6* 4.6 - 6.5 %  Final   Glycemic Control Guidelines for People with Diabetes:Non Diabetic:  <6%Goal of Therapy: <7%Additional Action Suggested:  >8%   . Sodium 08/21/2013 129* 135 - 145 mEq/L Final  . Potassium 08/21/2013 3.7  3.5 - 5.1 mEq/L Final  . Chloride 08/21/2013 90* 96 - 112 mEq/L Final  . CO2 08/21/2013 29  19 - 32 mEq/L Final  . Glucose, Bld 08/21/2013 416* 70 - 99 mg/dL Final  . BUN 08/21/2013 25* 6 - 23 mg/dL Final  . Creatinine, Ser 08/21/2013 1.9* 0.4 - 1.5 mg/dL Final  . Total Bilirubin 08/21/2013 0.7  0.3 - 1.2 mg/dL Final  . Alkaline Phosphatase 08/21/2013 167* 39 - 117 U/L Final  . AST 08/21/2013 14  0 - 37 U/L Final  . ALT 08/21/2013 23  0 - 53 U/L Final  . Total Protein 08/21/2013 8.1  6.0 - 8.3 g/dL  Final  . Albumin 08/21/2013 4.0  3.5 - 5.2 g/dL Final  . Calcium 08/21/2013 10.5  8.4 - 10.5 mg/dL Final  . GFR 08/21/2013 40.44* >60.00 mL/min Final

## 2013-11-18 LAB — FRUCTOSAMINE: Fructosamine: 471 umol/L — ABNORMAL HIGH (ref 190–270)

## 2013-11-19 ENCOUNTER — Other Ambulatory Visit: Payer: Self-pay | Admitting: Endocrinology

## 2013-12-30 ENCOUNTER — Ambulatory Visit (INDEPENDENT_AMBULATORY_CARE_PROVIDER_SITE_OTHER): Payer: Medicaid Other | Admitting: Endocrinology

## 2013-12-30 ENCOUNTER — Encounter: Payer: Self-pay | Admitting: Endocrinology

## 2013-12-30 VITALS — BP 130/78 | HR 61 | Temp 98.4°F | Resp 16 | Ht 69.0 in | Wt 317.6 lb

## 2013-12-30 DIAGNOSIS — N183 Chronic kidney disease, stage 3 unspecified: Secondary | ICD-10-CM

## 2013-12-30 DIAGNOSIS — E782 Mixed hyperlipidemia: Secondary | ICD-10-CM

## 2013-12-30 DIAGNOSIS — IMO0001 Reserved for inherently not codable concepts without codable children: Secondary | ICD-10-CM

## 2013-12-30 DIAGNOSIS — E1165 Type 2 diabetes mellitus with hyperglycemia: Principal | ICD-10-CM

## 2013-12-30 LAB — COMPREHENSIVE METABOLIC PANEL
ALBUMIN: 3.7 g/dL (ref 3.5–5.2)
ALT: 32 U/L (ref 0–53)
AST: 27 U/L (ref 0–37)
Alkaline Phosphatase: 118 U/L — ABNORMAL HIGH (ref 39–117)
BILIRUBIN TOTAL: 0.5 mg/dL (ref 0.2–1.2)
BUN: 17 mg/dL (ref 6–23)
CO2: 29 meq/L (ref 19–32)
Calcium: 9.7 mg/dL (ref 8.4–10.5)
Chloride: 98 mEq/L (ref 96–112)
Creatinine, Ser: 1.4 mg/dL (ref 0.4–1.5)
GFR: 56.87 mL/min — ABNORMAL LOW (ref >60.00)
GLUCOSE: 127 mg/dL — AB (ref 70–99)
Potassium: 3.5 mEq/L (ref 3.5–5.1)
Sodium: 136 mEq/L (ref 135–145)
Total Protein: 7.1 g/dL (ref 6.0–8.3)

## 2013-12-30 LAB — MICROALBUMIN / CREATININE URINE RATIO
Creatinine,U: 62 mg/dL
MICROALB UR: 7 mg/dL — AB (ref 0.0–1.9)
Microalb Creat Ratio: 11.3 mg/g (ref 0.0–30.0)

## 2013-12-30 LAB — LIPID PANEL
CHOLESTEROL: 154 mg/dL (ref 0–200)
HDL: 36.5 mg/dL — ABNORMAL LOW (ref >39.00)
LDL Cholesterol: 53 mg/dL (ref 0–99)
NonHDL: 117.5
Total CHOL/HDL Ratio: 4
Triglycerides: 324 mg/dL — ABNORMAL HIGH (ref 0.0–149.0)
VLDL: 64.8 mg/dL — AB (ref 0.0–40.0)

## 2013-12-30 LAB — HEMOGLOBIN A1C: Hgb A1c MFr Bld: 14.6 % — ABNORMAL HIGH (ref 4.6–6.5)

## 2013-12-30 NOTE — Progress Notes (Signed)
Patient ID: Ethan Campbell, male   DOB: [birth year], 53 y.o.   MRN: [id number]   Reason for Appointment: Diabetes follow-up   History of Present Illness   Diagnosis: Type 2 DIABETES MELITUS, date of diagnosis: 2000    Previous history: he has been on insulin for several years with consistently poor control Has been requiring large doses of insulin for his diabetes but A1c has been persistently high Blood sugars did not improve significantly even with trying Byetta and Victoza In 2014 he was switched from NovoLog to U-500 insulin but not clear if he has had improvement in control except with fasting readings His prior A1c was 14.0 in 5/14 and he had educational discussions with diabetes educator and dietitian in 6/14   Recent history: His blood sugars are still very difficult to control consistently On his last visit because of marked increase in fasting readings Lantus was increased in evening by 20 units Recently has had only one very significant high reading in the morning only He did have a mild hypoglycemia episode in the morning but he thinks he may have taken extra U-500 insulin the night before for a glucose of 447 Even though his blood sugars are mostly high before his afternoon meal most of his readings in the last 10 days are fairly good compared to 5/15 His A1c has been generally very high despite using high doses of insulin and also Victoza Now taking the equivalent of 250 units of rapid acting insulin with his Humulin regular Even though his U-500 insulin was increased on the last visit his overall readings are still high For some reason his fasting readings are more consistently high and averaging well over 300 Still has  variability in blood sugars later in the day  Because of his cardiologist restricting his activity has not been very active which was previously causing relatively low readings at times He does take 2-4 units extra U-500 insulin as directed when his blood  sugars are high     Oral hypoglycemic drugs: none  (renal dysfunction)     Side effects from medications: None Insulin regimen: U-500 insulin: 15 units for breakfast, 18 before lunch, 20 before supper, 30 minutes a.c.  Lantus insulin:  60 a.m.-- 60 p.m.        Proper timing of medications in relation to meals: Yes.    Meals: 3 meals per day.eating generally at  1 pm and 6-7 pm. Occasionally high fat meals       Monitors blood glucose:  0.8 times a day.    Glucometer:  Accu-Chek     Blood Glucose readings from meter download (not clear which in some postprandial):  PREMEAL Breakfast  1-5 PM   6-10 PM  Bedtime Overall  Glucose range:  55-106   54-552   132-447     Mean/median:     290   Last dietitian visit: 6/14     Physical activity: exercise: some yardwork 2-3/7    Wt Readings from Last 3 Encounters:  12/30/13 317 lb 9.6 oz (144.062 kg)  11/14/13 314 lb 3.2 oz (142.52 kg)  08/21/13 310 lb 9.6 oz (140.887 kg)    LABS:  Lab Results  Component Value Date   HGBA1C 12.6* 08/21/2013   HGBA1C 14.3* 05/20/2013   Lab Results  Component Value Date   MICROALBUR 5.2* 11/14/2013   CREATININE 1.9* 11/14/2013    No visits with results within 1 Week(s) from this visit. Latest known visit with results is:  Appointment on 11/14/2013  Component Date Value Ref Range Status  . Fructosamine 11/14/2013 471* 190 - 270 umol/L Final  . Sodium 11/14/2013 135  135 - 145 mEq/L Final  . Potassium 11/14/2013 3.5  3.5 - 5.1 mEq/L Final  . Chloride 11/14/2013 100  96 - 112 mEq/L Final  . CO2 11/14/2013 26  19 - 32 mEq/L Final  . Glucose, Bld 11/14/2013 229* 70 - 99 mg/dL Final  . BUN 11/14/2013 26* 6 - 23 mg/dL Final  . Creatinine, Ser 11/14/2013 1.9* 0.4 - 1.5 mg/dL Final  . Calcium 11/14/2013 9.4  8.4 - 10.5 mg/dL Final  . GFR 11/14/2013 39.19* >60.00 mL/min Final  . Microalb, Ur 11/14/2013 5.2* 0.0 - 1.9 mg/dL Final  . Creatinine,U 11/14/2013 129.3   Final  . Microalb Creat Ratio 11/14/2013 4.0   0.0 - 30.0 mg/g Final  . Color, Urine 11/14/2013 YELLOW  Yellow;Lt. Yellow Final  . APPearance 11/14/2013 CLEAR  Clear Final  . Specific Gravity, Urine 11/14/2013 1.025  1.000-1.030 Final  . pH 11/14/2013 6.0  5.0 - 8.0 Final  . Total Protein, Urine 11/14/2013 NEGATIVE  Negative Final  . Urine Glucose 11/14/2013 500* Negative Final  . Ketones, ur 11/14/2013 NEGATIVE  Negative Final  . Bilirubin Urine 11/14/2013 NEGATIVE  Negative Final  . Hgb urine dipstick 11/14/2013 TRACE-INTACT* Negative Final  . Urobilinogen, UA 11/14/2013 0.2  0.0 - 1.0 Final  . Leukocytes, UA 11/14/2013 NEGATIVE  Negative Final  . Nitrite 11/14/2013 NEGATIVE  Negative Final  . WBC, UA 11/14/2013 0-2/hpf  0-2/hpf Final  . RBC / HPF 11/14/2013 3-6/hpf* 0-2/hpf Final  . Mucus, UA 11/14/2013 Presence of* None Final  . Squamous Epithelial / LPF 11/14/2013 Rare(0-4/hpf)  Rare(0-4/hpf) Final      Medication List       This list is accurate as of: 12/30/13  1:30 PM.  Always use your most recent med list.               amLODipine 10 MG tablet  Commonly known as:  NORVASC  Take 10 mg by mouth daily.     aspirin 81 MG EC tablet  Take 81 mg by mouth daily.     atenolol 50 MG tablet  Commonly known as:  TENORMIN  Take 50 mg by mouth daily.     atorvastatin 80 MG tablet  Commonly known as:  LIPITOR  TAKE 1 TABLET ONCE DAILY.     DULoxetine 60 MG capsule  Commonly known as:  CYMBALTA  Take 60 mg by mouth daily.     fenofibrate 145 MG tablet  Commonly known as:  TRICOR  Take 145 mg by mouth daily.     Fish Oil 1000 MG Caps  Take 1,000 mg by mouth 2 (two) times daily.     FLUoxetine 20 MG capsule  Commonly known as:  PROZAC  Take 20 mg by mouth daily.     furosemide 80 MG tablet  Commonly known as:  LASIX  Take 80 mg by mouth daily.     gabapentin 300 MG capsule  Commonly known as:  NEURONTIN  Take 300 mg by mouth 3 (three) times daily.     HUMULIN R 500 UNIT/ML Soln injection  Generic drug:   insulin regular human CONCENTRATED  AS DIRECTED UP TO 35 UNITS 3 TIMES DAILY.     HUMULIN R U-500 (CONCENTRATED) [place]  Inject into the skin. 15 breakfast 18 and lunch and 20 at supper     HYDROcodone-acetaminophen  7.5-500 MG per tablet  Commonly known as:  LORTAB  Take 1 tablet by mouth 3 (three) times daily.     LANTUS OPTICLIK 100 UNIT/ML cartridge  Generic drug:  Insulin Glargine  Inject 60 Units into the skin 2 (two) times daily.     NON FORMULARY  C-PAP Machine. Use as directed at bedtime.     potassium chloride 10 MEQ tablet  Commonly known as:  K-DUR  TAKE 2 TABLETS BY MOUTH ONCE DAILY.     pravastatin 20 MG tablet  Commonly known as:  PRAVACHOL  Take 20 mg by mouth at bedtime.     VICTOZA 18 MG/3ML Sopn  Generic drug:  Liraglutide  INJECT 1.2 MG SUBCUTANEOUSLY ONCE DAILY.     Vitamin D (Ergocalciferol) 50000 UNITS Caps capsule  Commonly known as:  DRISDOL  TAKE 1 CAPSULE TWICE A WEEK     zolpidem 10 MG tablet  Commonly known as:  AMBIEN  Take 10 mg by mouth at bedtime as needed for sleep.        Allergies: No Known Allergies  Past Medical History  Diagnosis Date  . Sleep apnea     Dr. Brandon Melnick  . Edema     Venous insufficiency  . Proteinuria   . Obesity   . Diabetes mellitus   . Hypertension     Benign  . Precordial pain 12/23/09    Nuclear stress; no ischemia; EF 60%  . Hyperlipidemia   . History of echocardiogram 12/09    EF 60-65%; mild increased RV; 38 mmHg    Past Surgical History  Procedure Laterality Date  . 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 Heart attack Mother 60  . Cancer Mother     Breast  . Heart attack Father 31  . Cancer Sister     Breast  . Cancer Sister     Colon    Social History:  reports that he has quit smoking. He has never used smokeless tobacco. His alcohol and drug histories are not on file.  Review of  Systems:  Blackout episodes with leg weakness. This occurs for only a minute or so and during this time he cannot hear or talk but his vision is fairly good; it is followed by headache. BP and glucose during the episode have been checked by his wife and were normal. Is now getting cardiology evaluation and has not seen a neurologist  Hypertension: currently on atenolol, HCTZ, Lasix and blood pressure is  normal today No history of edema recently  Lipids: triglycerides are  relatively high despite taking fenofibrate. LDL is below 100, taking pravastatin  Lab Results  Component Value Date   CHOL 150 05/20/2013   HDL 37.30* 05/20/2013   LDLDIRECT 77.9 05/20/2013   TRIG 265.0* 05/20/2013   CHOLHDL 4 05/20/2013      CKD: His creatinine has ranged from 1.6-2.1 in the last 2 years   Lab Results  Component Value Date   CREATININE 1.4 12/30/2013    Examination:   BP 130/78  Pulse 61  Temp(Src) 98.4 F (36.9 C)  Resp 16  Ht 5\' 9"  (1.753 m)  Wt 317 lb 9.6 oz (144.062 kg)  BMI 46.88 kg/m2  SpO2 97%  Body mass index is 46.88 kg/(m^2).    ASSESSMENT/ PLAN:   Diabetes type 2   Blood glucose control is still  inadequate along with Unpredictable fluctuation in blood sugars especially later in the day But he is not checking his blood sugars enough mostly late afternoon His fasting readings overall better with increasing evening Lantus Also overall blood sugars are significantly better in the last 10 days except for a couple of sporadic high readings He has not been as active and this may be causing some high readings Overall compliant with his insulin regimen but is still requiring over 300 units of insulin total a day even with taking Victoza He thinks his diet is fairly good and has seen the dietitian and 2014 See history of present illness for detailed analysis of his blood sugar patterns and day-to-day management He is not a candidate for Invokana because of his renal  dysfunction  Also not clear if he has coverage for an insulin pump because of his insurance, no recent C-peptide levels available  PLAN:  Since blood sugars are overall relatively good in the last 10 days will not change his insulin Consider checking C-peptide an insulin pump although does not think he can be changed to do this and also has not been compliant with checking his blood sugars enough  Renal dysfunction: Etiology of this is unclear, but retracting today  Hyperlipidemia: Needs fasting lipids done  Counseling time over 50% of today's 25 minute visit  Ethan Campbell 12/30/2013, 1:30 PM    A1c higher than expected Triglycerides still high, nonfasting Creatinine 1.4: Since GFR is over 45 and give him a trial of Invokana 100 mg daily  Office Visit on 12/30/2013  Component Date Value Ref Range Status  . Hemoglobin A1C 12/30/2013 14.6* 4.6 - 6.5 % Final   Glycemic Control Guidelines for People with Diabetes:Non Diabetic:  <6%Goal of Therapy: <7%Additional Action Suggested:  >8%   . Sodium 12/30/2013 136  135 - 145 mEq/L Final  . Potassium 12/30/2013 3.5  3.5 - 5.1 mEq/L Final  . Chloride 12/30/2013 98  96 - 112 mEq/L Final  . CO2 12/30/2013 29  19 - 32 mEq/L Final  . Glucose, Bld 12/30/2013 127* 70 - 99 mg/dL Final  . BUN 12/30/2013 17  6 - 23 mg/dL Final  . Creatinine, Ser 12/30/2013 1.4  0.4 - 1.5 mg/dL Final  . Total Bilirubin 12/30/2013 0.5  0.2 - 1.2 mg/dL Final  . Alkaline Phosphatase 12/30/2013 118* 39 - 117 U/L Final  . AST 12/30/2013 27  0 - 37 U/L Final  . ALT 12/30/2013 32  0 - 53 U/L Final  . Total Protein 12/30/2013 7.1  6.0 - 8.3 g/dL Final  . Albumin 12/30/2013 3.7  3.5 - 5.2 g/dL Final  . Calcium 12/30/2013 9.7  8.4 - 10.5 mg/dL Final  . GFR 12/30/2013 56.87* >60.00 mL/min Final  . Cholesterol 12/30/2013 154  0 - 200 mg/dL Final   ATP III Classification       Desirable:  < 200 mg/dL               Borderline High:  200 - 239 mg/dL          High:  > = 240 mg/dL  .  Triglycerides 12/30/2013 324.0* 0.0 - 149.0 mg/dL Final   Normal:  <150 mg/dLBorderline High:  150 - 199 mg/dL  . HDL 12/30/2013 36.50* >39.00 mg/dL Final  . VLDL 12/30/2013 64.8* 0.0 - 40.0 mg/dL Final  . LDL Cholesterol 12/30/2013 53  0 - 99 mg/dL Final  . Total CHOL/HDL Ratio 12/30/2013 4   Final  Men          Women1/2 Average Risk     3.4          3.3Average Risk          5.0          4.42X Average Risk          9.6          7.13X Average Risk          15.0          11.0                      . NonHDL 12/30/2013 117.50   Final  . Microalb, Ur 12/30/2013 7.0* 0.0 - 1.9 mg/dL Final  . Creatinine,U 12/30/2013 62.0   Final  . Microalb Creat Ratio 12/30/2013 11.3  0.0 - 30.0 mg/g Final

## 2013-12-31 LAB — C-PEPTIDE: C PEPTIDE: 0.9 ng/mL — AB (ref 1.1–4.4)

## 2014-01-01 ENCOUNTER — Other Ambulatory Visit: Payer: Self-pay | Admitting: *Deleted

## 2014-01-01 MED ORDER — CANAGLIFLOZIN 100 MG PO TABS: ORAL_TABLET | ORAL | Status: DC

## 2014-01-08 ENCOUNTER — Ambulatory Visit: Payer: Medicaid Other | Admitting: Cardiology

## 2014-01-11 ENCOUNTER — Other Ambulatory Visit: Payer: Self-pay | Admitting: Endocrinology

## 2014-02-04 ENCOUNTER — Other Ambulatory Visit: Payer: Self-pay | Admitting: Endocrinology

## 2014-02-27 ENCOUNTER — Other Ambulatory Visit: Payer: Self-pay | Admitting: Endocrinology

## 2014-03-17 ENCOUNTER — Ambulatory Visit (INDEPENDENT_AMBULATORY_CARE_PROVIDER_SITE_OTHER): Payer: Medicaid Other | Admitting: Neurology

## 2014-03-17 ENCOUNTER — Encounter: Payer: Self-pay | Admitting: Neurology

## 2014-03-17 VITALS — Ht 69.0 in | Wt 293.6 lb

## 2014-03-17 DIAGNOSIS — R42 Dizziness and giddiness: Secondary | ICD-10-CM

## 2014-03-17 DIAGNOSIS — E1165 Type 2 diabetes mellitus with hyperglycemia: Principal | ICD-10-CM

## 2014-03-17 DIAGNOSIS — IMO0001 Reserved for inherently not codable concepts without codable children: Secondary | ICD-10-CM

## 2014-03-17 NOTE — Patient Instructions (Addendum)
-   symptoms most consistent with orthostatic hypotension, syncope or pre-syncope, most likely secondary to DM neuropathy and autonomic dysfunction - need to monitor BP at home, and also check BP around the episode and document - will check MRA head and carotid doppler for stroke work up - will request 2D echo report from PCP - will schedule EEG to rule out seizure - need better control for DM, please follow up with your PCP and endocrinologist to better control DM - stand up really slow and avoid fall - follow up in one month - compression stocking

## 2014-03-17 NOTE — Progress Notes (Signed)
NEUROLOGY CLINIC NEW PATIENT NOTE  NAME: Ethan Campbell DOB: 23-Jan-1961  I saw Ethan Campbell as a new patient in the neurovascular clinic today regarding  Chief Complaint  Patient presents with  . Dizziness    np #8  .  HPI: Ethan Campbell is a 53 y.o. male with PMH of hypertension, hyperlipidemia, uncontrolled diabetes, and stage III CKD who presents as a new patient for dizziness and passing out spells. He stated that the episodes started about one year ago when he was doing yard work, and standing up, he had a spells of lightheadedness, sweating, blurry vision, and his friends that he became pale white. He had a set down for about 10 minutes, he was back to his baseline and continue to work. At that time, and happen about once a month. Before the episode he may feel tingling and numbness over her body from feet to his head. For the last 3-4 months, the frequency of the spells increased from 1-2 per day to sometime 10-15 times a day. Almost all episodes happened when he tried to stand up and walking, he had tunnel vision, lightheadedness, feeling of going to pass out, he had to sit down, if there is no place he can sit down, he would pass out. When he woke up, he was on the ground, can see and hear, but voice more like from distant, and he was not able to respond, and afterwards he was very tired for a while. He denies any chest pain, shortness breath, vertigo, no jerking, shaking, or seizure-like activities, post ictal confusion. The last time it happened was in the restaurant up to eating, he got up, and passed out, when woke up he was in the chair and his wife was shaking his shoulders and calling his name. Wife said he passed out, stopped breathing, lip turned blue before waking up.   He has been followed up with his primary doctor and cardiologist, had to 2D echo done (reported nonviable), was told with only slight valve regurgitation, but diagnosed with congestive heart failure,  currently on Lasix twice a day. Stress test was normal as per patient. He also had MRI brain which showed no acute intracranial abnormality, moderate age advanced white matter disease, and 2 foci of remote microhemorrhages in the high frontal lobes. Therefore, patient was referred to neurology for further evaluation.  Patient had an uncontrolled diabetes, his last A1c in January was 12.6. He has been following with Dr. Dwyane Dee for diabetic control. Currently in insulin injection. He also had a numbness tingling in his toes and fingers consistent with diabetic neuropathy.  He also had hypertension, hyperlipidemia, and that he is on amlodipine 10 mg, Lasix, as well as Lipitor. He is also taking aspirin 81 mg for stroke prevention after MRI result.  He stated that he had a diagnosis of epilepsy in the past. He stated that he had a grand mal seizure in the past, and he was on Dilantin. He was born with HIE, and stated in hospital for 8 months after bitrh. He continued to have seizure until 53 years old. He then had less frequent seizures, the last one was around 2004. He is not on Dilantin at this moment.  Past Medical History  Diagnosis Date  . Sleep apnea     Dr. Brandon Melnick  . Edema     Venous insufficiency  . Proteinuria   . Obesity   . Diabetes mellitus   . Hypertension     Benign  .  Precordial pain 12/23/09    Nuclear stress; no ischemia; EF 60%  . Hyperlipidemia   . History of echocardiogram 12/09    EF 60-65%; mild increased RV; 38 mmHg  . Syncope and collapse    Past Surgical History  Procedure Laterality Date  . Resection bone tumor femur  1980's    Left femur, treated at [place] with bone graft   Family History  Problem Relation Age of Onset  . Heart attack Mother 57  . Cancer Mother     Breast  . Heart attack Father 12  . Cancer Sister     Breast  . Cancer Sister     Colon   Current Outpatient Prescriptions  Medication Sig Dispense Refill  . ACCU-CHEK AVIVA PLUS test strip  TEST BLOOD SUGAR 4 TIMES DAILY.  125 each  3  . amLODipine (NORVASC) 10 MG tablet Take 10 mg by mouth daily.      Marland Kitchen aspirin 81 MG EC tablet Take 81 mg by mouth daily.        Marland Kitchen atorvastatin (LIPITOR) 80 MG tablet TAKE 1 TABLET ONCE DAILY.  30 tablet  5  . Canagliflozin (INVOKANA) 100 MG TABS Take 1 tablet daily  30 tablet  3  . DULoxetine (CYMBALTA) 60 MG capsule Take 60 mg by mouth daily.        . fenofibrate (TRICOR) 145 MG tablet Take 145 mg by mouth daily.        Marland Kitchen FLUoxetine (PROZAC) 20 MG capsule Take 20 mg by mouth daily.      . furosemide (LASIX) 80 MG tablet Take 80 mg by mouth daily.        Marland Kitchen gabapentin (NEURONTIN) 300 MG capsule Take 300 mg by mouth 3 (three) times daily.      Marland Kitchen HUMULIN R 500 UNIT/ML SOLN injection AS DIRECTED UP TO 35 UNITS 3 TIMES DAILY.  20 mL  5  . HYDROcodone-acetaminophen (NORCO) 7.5-325 MG per tablet Take 1 tablet by mouth every 6 (six) hours as needed for moderate pain.      . Insulin Glargine (LANTUS OPTICLIK) 100 UNIT/ML SOCT Inject 60 Units into the skin 2 (two) times daily.       . Insulin Regular Human (HUMULIN R U-500, CONCENTRATED, [place]) Inject into the skin. 15 breakfast 18 and lunch and 20 at supper      . Liraglutide (VICTOZA) 18 MG/3ML SOPN INJECT 1.8 MG SUBCUTANEOUSLY ONCE DAILY.      . NON FORMULARY C-PAP Machine. Use as directed at bedtime.       . potassium chloride (K-DUR) 10 MEQ tablet TAKE 2 TABLETS BY MOUTH ONCE DAILY.  60 tablet  3  . potassium chloride (K-DUR) 10 MEQ tablet TAKE 2 TABLETS BY MOUTH ONCE DAILY.  60 tablet  3  . pravastatin (PRAVACHOL) 20 MG tablet Take 20 mg by mouth at bedtime.        . Vitamin D, Ergocalciferol, (DRISDOL) 50000 UNITS CAPS capsule TAKE 1 CAPSULE TWICE A WEEK  8 capsule  3   No current facility-administered medications for this visit.   No Known Allergies History   Social History  . Marital Status: Single    Spouse Name: N/A    Number of Children: 0  . Years of Education: GED   Occupational History    . disabled    Social History Main Topics  . Smoking status: Former Research scientist (life sciences)  . Smokeless tobacco: Never Used  . Alcohol Use: No  . Drug Use:  No  . Sexual Activity: No   Other Topics Concern  . Not on file   Social History Narrative  . No narrative on file    Review of Systems Full 14 system review of systems performed and notable only for those listed, all others are neg:  Constitutional: weight-loss fatigue   Cardiovascular:  swelling in legs   Ear/Nose/Throat:  ringing in the year, trouble swallowing   Skin: N/A  Eyes:  blurry vision, eye pain   Respiratory:  wheezing snoring   Gastroitestinal:  diarrhea constipation  Hematology/Lymphatic: N/A  Endocrine:  impotance , feeling cold, increased thirst, flushing Musculoskeletal: Joint pain joint swelling cramps aching muscles  Allergy/Immunology: N/A  Neurological:  memory loss, confusion, headache, numbness, weakness, difficulty swallowing, dizziness, passing out, insomnia, sleepiness   Psychiatric:  depression, not enough sleep, decreased energy, change in appetite,  Disinterest in activities, racing thoughts   Physical Exam  Blood pressure lying down 150/90, heart rate 75. Siting 131/85, heart rate 83. Standing 124/79, heart rate 92.  General - overweight, well developed, in no apparent distress.  Ophthalmologic - Sharp disc margins OU.  Cardiovascular - Regular rate and rhythm with no murmur.    Neck - supple, no nuchal rigidity .  Mental Status -  Level of arousal and orientation to time, place, and person were intact. Language including expression, naming, repetition, comprehension was assessed and found intact.  Cranial Nerves II - XII - II - Visual field intact OU. III, IV, VI - Extraocular movements intact. V - Facial sensation intact bilaterally. VII - Facial movement intact bilaterally. VIII - Hearing & vestibular intact bilaterally. X - Palate elevates symmetrically. XI - Chin turning & shoulder shrug  intact bilaterally. XII - Tongue protrusion intact.  Motor Strength - The patient's strength was normal in all extremities and pronator drift was absent.  Bulk was normal and fasciculations were absent.   Motor Tone - Muscle tone was assessed at the neck and appendages and was normal.  Reflexes - The patient's reflexes were 1+ in all extremities and he had no pathological reflexes.  Sensory - Light touch, temperature/pinprick symmetrical, but vibration and proprioception diminished below ankle.  Coordination - The patient had normal movements in the hands and feet with no ataxia or dysmetria.  Tremor was absent.  Gait and Station - The patient's transfers, posture, gait, station, and turns were observed as normal, but recommended to walk and stand up slow..   Imaging MRI brain - 02/27/2014 impression: 1. No evidence of acute intracranial abnormality or mass. 2. Moderate age advanced white matter disease. This is nonspecific but is most often seen in the setting of chronic small vessel ischemia, with other considerations including sequelae of prior,, migraines, pes planus, and hypercoagulable state. Demyelinating disease is also possible, however the appearance is not particularly suggestive of this. 3. Two fourths I of remote microhemorrhages in the high frontal loss, possibly secondary to remote small vessel infarcts or trauma.  Lab Review Wt Readings from Last 3 Encounters:   12/30/13  317 lb 9.6 oz (144.062 kg)   11/14/13  314 lb 3.2 oz (142.52 kg)   08/21/13  310 lb 9.6 oz (140.887 kg)   LABS:  Lab Results   Component  Value  Date    HGBA1C  12.6*  08/21/2013    HGBA1C  14.3*  05/20/2013    Lab Results   Component  Value  Date    MICROALBUR  5.2*  11/14/2013    CREATININE  1.9*  11/14/2013   No visits with results within 1 Week(s) from this visit.  Latest known visit with results is:  Appointment on 11/14/2013   Component  Date  Value  Ref Range  Status   .  Fructosamine   11/14/2013  471*  190 - 270 umol/L  Final   .  Sodium  11/14/2013  135  135 - 145 mEq/L  Final   .  Potassium  11/14/2013  3.5  3.5 - 5.1 mEq/L  Final   .  Chloride  11/14/2013  100  96 - 112 mEq/L  Final   .  CO2  11/14/2013  26  19 - 32 mEq/L  Final   .  Glucose, Bld  11/14/2013  229*  70 - 99 mg/dL  Final   .  BUN  11/14/2013  26*  6 - 23 mg/dL  Final   .  Creatinine, Ser  11/14/2013  1.9*  0.4 - 1.5 mg/dL  Final   .  Calcium  11/14/2013  9.4  8.4 - 10.5 mg/dL  Final   .  GFR  11/14/2013  39.19*  >60.00 mL/min  Final   .  Microalb, Ur  11/14/2013  5.2*  0.0 - 1.9 mg/dL  Final   .  Creatinine,U  11/14/2013  129.3   Final   .  Microalb Creat Ratio  11/14/2013  4.0  0.0 - 30.0 mg/g  Final   .  Color, Urine  11/14/2013  YELLOW  Yellow;Lt. Yellow  Final   .  APPearance  11/14/2013  CLEAR  Clear  Final   .  Specific Gravity, Urine  11/14/2013  1.025  1.000-1.030  Final   .  pH  11/14/2013  6.0  5.0 - 8.0  Final   .  Total Protein, Urine  11/14/2013  NEGATIVE  Negative  Final   .  Urine Glucose  11/14/2013  500*  Negative  Final   .  Ketones, ur  11/14/2013  NEGATIVE  Negative  Final   .  Bilirubin Urine  11/14/2013  NEGATIVE  Negative  Final   .  Hgb urine dipstick  11/14/2013  TRACE-INTACT*  Negative  Final   .  Urobilinogen, UA  11/14/2013  0.2  0.0 - 1.0  Final   .  Leukocytes, UA  11/14/2013  NEGATIVE  Negative  Final   .  Nitrite  11/14/2013  NEGATIVE  Negative  Final   .  WBC, UA  11/14/2013  0-2/hpf  0-2/hpf  Final   .  RBC / HPF  11/14/2013  3-6/hpf*  0-2/hpf  Final   .  Mucus, UA  11/14/2013  Presence of*  None  Final   .  Squamous Epithelial / LPF  11/14/2013  Rare(0-4/hpf)  Rare(0-4/hpf)  Final      Assessment and Plan:   In summary, Ethan Campbell is a 53 y.o. male with PMH of hypertension, hyperlipidemia, uncontrolled diabetes and chronic kidney disease presents dizzy and passing out spells for about a year. Outpatient workup for cardiac source, was told to have mild  lobular regurgitation and congestive heart failure, but not able to explain the spells. Had MRI brain as above, and neurology was consulted.  From his description of the spells, almost all related to positioning changes from sitting to standing, with prodromal symptoms of syncope and presyncope. Symptoms relieved when sitting down. Denies vertigo, jerking, shaking, postictal confusion or combative. This spells are most consistent with syncope and presyncope, most likely  due to orthostatic hypotension. In the clinic, we checked orthostatic vitals, there is a trending for orthostatic hypotension. He had uncontrolled diabetes, his A1c last checked was 12.6 indicating poorly controlled. He had diabetic neuropathy with autonomic dysfunction, which explains his orthostatic hypotension as well as impotance, and finger and toe numbness / tingling.   He also had multiple stroke risk factors including hypertension, hyperlipidemia, diabetes, so we'll continue some stroke workup including MRA, carotid Doppler.  He also had a history of epilepsy with grand mal seizures, last one was more than 10 years ago. Although this spells are not seizure like activity, we will go head with a EEG to rule out any seizure activity.   - compression stockings - check BP at home closely, especially around the time of spells, to get baseline and confirm the diagnosis of syncope. - May consider medical treatment for orthostatic hypotension, such as fludrocortisone, midodrine, or mestinon - recommend PT/OT for balance training - will request 2D report from PCP - Continue follow up with PCP and endocrinologist for better diabetic control - stroke work up with MRA and carotid doppler - EEG to rule out seizure - Standing up slowly and avoid fall - RTC in one month   Thank you very much for the opportunity to participate in the care of this patient.  Please do not hesitate to call if any questions or concerns arise.  Orders Placed  This Encounter  Procedures  . Compression stockings  . MR MRA HEAD WO CONTRAST    Standing Status: Future     Number of Occurrences:      Standing Expiration Date: 05/19/2015    Order Specific Question:  Reason for Exam (SYMPTOM  OR DIAGNOSIS REQUIRED)    Answer:  dizzy spells    Order Specific Question:  Preferred imaging location?    Answer:  Internal    Order Specific Question:  Does the patient have a pacemaker or implanted devices?    Answer:  No    Order Specific Question:  What is the patient's sedation requirement?    Answer:  No Sedation  . US Carotid Bilateral    Standing Status: Future     Number of Occurrences:      Standing Expiration Date: 05/19/2015    Order Specific Question:  Reason for Exam (SYMPTOM  OR DIAGNOSIS REQUIRED)    Answer:  dizzy spells with hx of Dm    Order Specific Question:  Preferred imaging location?    Answer:  Internal  . EEG adult    Standing Status: Future     Number of Occurrences:      Standing Expiration Date: 03/18/2015    Order Specific Question:  Where should this test be performed?    Answer:  [facility]    Patient Instructions  - symptoms most consistent with orthostatic hypotension, syncope or pre-syncope, most likely secondary to DM neuropathy and autonomic dysfunction - need to monitor BP at home, and also check BP around the episode and document - will check MRA head and carotid doppler for stroke work up - will request 2D echo report from PCP - will schedule EEG to rule out seizure - need better control for DM, please follow up with your PCP and endocrinologist to better control DM - stand up really slow and avoid fall - follow up in one month - compression stocking   Rosalin Hawking, MD PhD [facility] [address] [place], [place] [address] [phone]

## 2014-03-19 ENCOUNTER — Ambulatory Visit (INDEPENDENT_AMBULATORY_CARE_PROVIDER_SITE_OTHER): Payer: Medicaid Other | Admitting: Radiology

## 2014-03-19 ENCOUNTER — Other Ambulatory Visit: Payer: Self-pay | Admitting: Endocrinology

## 2014-03-19 DIAGNOSIS — R42 Dizziness and giddiness: Secondary | ICD-10-CM

## 2014-03-19 DIAGNOSIS — IMO0001 Reserved for inherently not codable concepts without codable children: Secondary | ICD-10-CM

## 2014-03-19 DIAGNOSIS — R55 Syncope and collapse: Secondary | ICD-10-CM

## 2014-03-19 DIAGNOSIS — E1165 Type 2 diabetes mellitus with hyperglycemia: Principal | ICD-10-CM

## 2014-03-19 NOTE — Procedures (Signed)
    History:  Ethan Campbell is a 53 year old gentleman with a history of dizziness syncopal events. The patient has a history of chronic renal insufficiency. In the past, he has had episodes that began about one year ago, and may be associated with episodes of lightheadedness, sweating, blurred vision, blanking in the face, and associated syncope. The patient is being evaluated for these events.  This is a routine EEG. No skull defects are noted. Medications include Norvasc, aspirin, Lipitor, Invokana, TriCor, Cymbalta, Prozac, Lasix, gabapentin, insulin, hydrocodone, Victoza, potassium, Pravachol, and vitamin D.   EEG classification: Normal awake  Description of the recording: The background rhythms of this recording consists of a fairly well modulated medium amplitude alpha rhythm of 9 Hz that is reactive to eye opening and closure. As the record progresses, the patient appears to remain in the waking state throughout the recording. Photic stimulation was performed, resulting in a bilateral and symmetric photic driving response. Hyperventilation was also performed, resulting in a minimal buildup of the background rhythm activities without significant slowing seen. At no time during the recording does there appear to be evidence of spike or spike wave discharges or evidence of focal slowing. EKG monitor shows no evidence of cardiac rhythm abnormalities with a heart rate of 90.  Impression: This is a normal EEG recording in the waking state. No evidence of ictal or interictal discharges are seen.

## 2014-03-20 ENCOUNTER — Telehealth: Payer: Self-pay | Admitting: *Deleted

## 2014-03-20 NOTE — Telephone Encounter (Signed)
Message copied by Fran Lowes on Thu Mar 20, 2014  8:42 AM ------      Message from: Rosalin Hawking      Created: Thu Mar 20, 2014  6:33 AM       Hello, Could you please call the pt and tell him that his EEG result is normal. Thanks much. ------

## 2014-03-20 NOTE — Telephone Encounter (Signed)
Called patient lvm letting him know per Dr. Erlinda Hong his EEG results were normal.

## 2014-03-20 NOTE — Telephone Encounter (Signed)
Patient was left a message that the EEG was normal and to call the office with any questions or concerns.

## 2014-04-02 ENCOUNTER — Ambulatory Visit (INDEPENDENT_AMBULATORY_CARE_PROVIDER_SITE_OTHER): Payer: Medicaid Other | Admitting: Endocrinology

## 2014-04-02 ENCOUNTER — Encounter: Payer: Self-pay | Admitting: Endocrinology

## 2014-04-02 VITALS — BP 110/74 | HR 89 | Temp 98.5°F | Resp 16 | Ht 69.0 in | Wt 294.8 lb

## 2014-04-02 DIAGNOSIS — IMO0001 Reserved for inherently not codable concepts without codable children: Secondary | ICD-10-CM

## 2014-04-02 DIAGNOSIS — N183 Chronic kidney disease, stage 3 unspecified: Secondary | ICD-10-CM

## 2014-04-02 DIAGNOSIS — E785 Hyperlipidemia, unspecified: Secondary | ICD-10-CM

## 2014-04-02 DIAGNOSIS — E1165 Type 2 diabetes mellitus with hyperglycemia: Principal | ICD-10-CM

## 2014-04-02 LAB — COMPREHENSIVE METABOLIC PANEL
ALT: 32 U/L (ref 0–53)
AST: 33 U/L (ref 0–37)
Albumin: 4 g/dL (ref 3.5–5.2)
Alkaline Phosphatase: 120 U/L — ABNORMAL HIGH (ref 39–117)
BILIRUBIN TOTAL: 0.5 mg/dL (ref 0.2–1.2)
BUN: 25 mg/dL — ABNORMAL HIGH (ref 6–23)
CO2: 28 mEq/L (ref 19–32)
Calcium: 9.3 mg/dL (ref 8.4–10.5)
Chloride: 96 mEq/L (ref 96–112)
Creatinine, Ser: 1.7 mg/dL — ABNORMAL HIGH (ref 0.4–1.5)
GFR: 44.73 mL/min — ABNORMAL LOW (ref >60.00)
GLUCOSE: 241 mg/dL — AB (ref 70–99)
Potassium: 4.2 mEq/L (ref 3.5–5.1)
SODIUM: 134 meq/L — AB (ref 135–145)
Total Protein: 8.1 g/dL (ref 6.0–8.3)

## 2014-04-02 LAB — HEMOGLOBIN A1C: HEMOGLOBIN A1C: 14.3 % — AB (ref 4.6–6.5)

## 2014-04-02 NOTE — Progress Notes (Addendum)
Patient ID: Ethan Campbell, male   DOB: 11-08-1960, 53 y.o.   MRN: [id number]   Reason for Appointment: Diabetes follow-up   History of Present Illness   Diagnosis: Type 2 DIABETES MELITUS, date of diagnosis: 2000    Previous history: he has been on insulin for several years with consistently poor control Has been requiring large doses of insulin for his diabetes but A1c has been persistently high Blood sugars did not improve significantly even with trying Byetta and Victoza In 2014 he was switched from NovoLog to U-500 insulin but not clear if he has had improvement in control except with fasting readings His prior A1c was 14.0 in 5/14 and he had educational discussions with diabetes educator and dietitian in 6/14   Recent history: His blood sugars are still very difficult to control  On his last visit because of marked increase in glucose he was told to start Invokana 100 mg, but not clear if this was denied by his insurance and he has not started this. His creatinine had improved to 1.4 Also has not been able to get his Victoza from the drug store for 2 months. Not clear again and this is a Medicaid issue His recent blood sugars appear to be much higher, averaging 370 Also he is not checking blood sugars very often, on an average less than once a day His A1c has been generally very high despite using high doses of insulin   Now taking the equivalent of 200 or more units of rapid acting insulin with his Humulin regular Blood sugar patterns:  He is checking his blood sugars mostly before his first meal and have been consistently high except today because it he took 35 units of his U 500 insulin at suppertime instead of the usual 20-25  Has sporadic readings in the afternoon and has only a couple of readings below 300, once after his first meal  He had a low sugar of 56 early morning probably from taking extra regular insulin the night before; however other readings during the night are  significantly high  He does take 2-5 units extra U-500 insulin as directed when his blood sugars are high     Oral hypoglycemic drugs: none  (renal dysfunction)     Side effects from medications: None Insulin regimen: U-500 insulin: 18 units for breakfast if eating, 18 before lunch, 20 before supper, 30 minutes a.c.  Lantus insulin:  60 a.m.-- 60 p.m.        Proper timing of medications in relation to meals: Yes.    Meals: 3 meals per day.eating generally at  1 pm and 6-7 pm. Occasionally high fat meals       Monitors blood glucose:  0.8 times a day.    Glucometer:  Accu-Chek     Blood Glucose readings from meter download (not clear which are postprandial):  Fasting: 114-575, average about 441 After lunch 171, 463 PC supper 442  Last dietitian visit: 6/14     Physical activity: exercise: some yardwork 2-3/7    Wt Readings from Last 3 Encounters:  04/02/14 294 lb 12.8 oz (133.72 kg)  03/17/14 293 lb 9.6 oz (133.176 kg)  12/30/13 317 lb 9.6 oz (144.062 kg)    LABS:  Lab Results  Component Value Date   HGBA1C 14.3* 04/02/2014   HGBA1C 14.6* 12/30/2013   HGBA1C 12.6* 08/21/2013   Lab Results  Component Value Date   MICROALBUR 7.0* 12/30/2013   LDLCALC 53 12/30/2013   CREATININE 1.7*  04/02/2014    Office Visit on 04/02/2014  Component Date Value Ref Range Status  . Hemoglobin A1C 04/02/2014 14.3* 4.6 - 6.5 % Final   Glycemic Control Guidelines for People with Diabetes:Non Diabetic:  <6%Goal of Therapy: <7%Additional Action Suggested:  >8%   . Sodium 04/02/2014 134* 135 - 145 mEq/L Final  . Potassium 04/02/2014 4.2  3.5 - 5.1 mEq/L Final  . Chloride 04/02/2014 96  96 - 112 mEq/L Final  . CO2 04/02/2014 28  19 - 32 mEq/L Final  . Glucose, Bld 04/02/2014 241* 70 - 99 mg/dL Final  . BUN 04/02/2014 25* 6 - 23 mg/dL Final  . Creatinine, Ser 04/02/2014 1.7* 0.4 - 1.5 mg/dL Final  . Total Bilirubin 04/02/2014 0.5  0.2 - 1.2 mg/dL Final  . Alkaline Phosphatase 04/02/2014 120* 39 - 117 U/L  Final  . AST 04/02/2014 33  0 - 37 U/L Final  . ALT 04/02/2014 32  0 - 53 U/L Final  . Total Protein 04/02/2014 8.1  6.0 - 8.3 g/dL Final  . Albumin 04/02/2014 4.0  3.5 - 5.2 g/dL Final  . Calcium 04/02/2014 9.3  8.4 - 10.5 mg/dL Final  . GFR 04/02/2014 44.73* >60.00 mL/min Final      Medication List       This list is accurate as of: 04/02/14  5:28 PM.  Always use your most recent med list.               ACCU-CHEK AVIVA PLUS test strip  Generic drug:  glucose blood  TEST BLOOD SUGAR 4 TIMES DAILY.     amLODipine 10 MG tablet  Commonly known as:  NORVASC  Take 10 mg by mouth daily.     aspirin 81 MG EC tablet  Take 81 mg by mouth daily.     atorvastatin 80 MG tablet  Commonly known as:  LIPITOR  TAKE 1 TABLET ONCE DAILY.     Canagliflozin 100 MG Tabs  Commonly known as:  INVOKANA  Take 1 tablet daily     DULoxetine 60 MG capsule  Commonly known as:  CYMBALTA  Take 60 mg by mouth daily.     fenofibrate 145 MG tablet  Commonly known as:  TRICOR  Take 145 mg by mouth daily.     FLUoxetine 20 MG capsule  Commonly known as:  PROZAC  Take 20 mg by mouth daily.     gabapentin 300 MG capsule  Commonly known as:  NEURONTIN  Take 300 mg by mouth 3 (three) times daily.     HUMULIN R 500 UNIT/ML Soln injection  Generic drug:  insulin regular human CONCENTRATED  AS DIRECTED UP TO 35 UNITS 3 TIMES DAILY.     HUMULIN R U-500 (CONCENTRATED) Virgil  Inject into the skin. 15 breakfast 18 and lunch and 20 at supper     HYDROcodone-acetaminophen 7.5-325 MG per tablet  Commonly known as:  NORCO  Take 1 tablet by mouth every 6 (six) hours as needed for moderate pain.     LANTUS OPTICLIK 100 UNIT/ML cartridge  Generic drug:  Insulin Glargine  Inject 60 Units into the skin 2 (two) times daily.     NON FORMULARY  C-PAP Machine. Use as directed at bedtime.     potassium chloride 10 MEQ tablet  Commonly known as:  K-DUR  TAKE 2 TABLETS BY MOUTH ONCE DAILY.     potassium  chloride 10 MEQ tablet  Commonly known as:  K-DUR  TAKE 2 TABLETS BY  MOUTH ONCE DAILY.     pravastatin 20 MG tablet  Commonly known as:  PRAVACHOL  Take 20 mg by mouth at bedtime.     VICTOZA 18 MG/3ML Sopn  Generic drug:  Liraglutide  INJECT 1.8 MG SUBCUTANEOUSLY ONCE DAILY.     Vitamin D (Ergocalciferol) 50000 UNITS Caps capsule  Commonly known as:  DRISDOL  TAKE 1 CAPSULE TWICE A WEEK        Allergies: No Known Allergies  Past Medical History  Diagnosis Date  . Sleep apnea     Dr. Brandon Melnick  . Edema     Venous insufficiency  . Proteinuria   . Obesity   . Diabetes mellitus   . Hypertension     Benign  . Precordial pain 12/23/09    Nuclear stress; no ischemia; EF 60%  . Hyperlipidemia   . History of echocardiogram 12/09    EF 60-65%; mild increased RV; 38 mmHg  . Syncope and collapse     Past Surgical History  Procedure Laterality Date  . 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 Heart attack Mother 51  . Cancer Mother     Breast  . Heart attack Father 9  . Cancer Sister     Breast  . Cancer Sister     Colon    Social History:  reports that he has quit smoking. He has never used smokeless tobacco. He reports that he does not drink alcohol or use illicit drugs.  Review of Systems:  Blackout episodes with leg weakness. This occurs for only a minute or so and during this time he cannot hear or talk but his vision is fairly good; it is followed by headache. BP and glucose during the episode have been checked by his wife and are normal.  Neurologist feels that he may have low blood pressure  Hypertension: currently on atenolol, HCTZ, Lasix and blood pressure is low normal today No history of edema   Lipids: triglycerides are  relatively high despite taking fenofibrate. LDL is below 100, taking pravastatin  Lab Results  Component Value Date   CHOL 154 12/30/2013    HDL 36.50* 12/30/2013   LDLCALC 53 12/30/2013   LDLDIRECT 77.9 05/20/2013   TRIG 324.0* 12/30/2013   CHOLHDL 4 12/30/2013      CKD: His creatinine has ranged from 1.4-2.1 in the last 2 years , etiology unclear  Lab Results  Component Value Date   CREATININE 1.7* 04/02/2014    Examination:   BP 110/74  Pulse 89  Temp(Src) 98.5 F (36.9 C)  Resp 16  Ht 5\' 9"  (1.753 m)  Wt 294 lb 12.8 oz (133.72 kg)  BMI 43.51 kg/m2  SpO2 95%  Body mass index is 43.51 kg/(m^2).    ASSESSMENT/ PLAN:   Diabetes type 2  See history of present illness for detailed analysis of his blood sugar patterns and day-to-day management  Blood glucose control is poor and possibly worse with stopping Victoza because of insurance issues Again he is not checking his blood sugars enough and mostly before his first meal despite instructions and reminders He appears to have persistently very high readings and they are better only when he takes extra regular insulin He still may be a candidate for Invokana since his GFR is over 45 but will need to be checked again Also will need to clarify whether  his Victoza is not covered by insurance His C-peptide is below normal and since he may benefit from an insulin pump will pursue this further. However discussed that he cannot have an insulin pump unless he is checking his blood sugars 3-4 times a day Given him brochure on insulin pump and discussed how it works Encouraged him to be more consistent with diet and exercise also  Insulin doses discussed with patient  Renal dysfunction: Etiology of this is unclear, but his level was improved on his last visit, to check this again today  Hyperlipidemia: Needs fasting lipids, however higher triglycerides may be from poor diabetes control   Patient Instructions  Please check blood sugars before each meal, also at least half the time about 2 hours after any meal and daily on waking up. Please bring blood sugar monitor to each  visit  Lantus 70 units twice daily  U-500 insulin 20 units in am and 26 at supper but don't take over 32 units    Counseling time over 50% of today's 25 minute visit  Maily Debarge 04/02/2014, 5:28 PM    A1c again very high  Since GFR is around 45 and give him a trial of Invokana 100 mg daily if approved  Office Visit on 04/02/2014  Component Date Value Ref Range Status  . Hemoglobin A1C 04/02/2014 14.3* 4.6 - 6.5 % Final   Glycemic Control Guidelines for People with Diabetes:Non Diabetic:  <6%Goal of Therapy: <7%Additional Action Suggested:  >8%   . Sodium 04/02/2014 134* 135 - 145 mEq/L Final  . Potassium 04/02/2014 4.2  3.5 - 5.1 mEq/L Final  . Chloride 04/02/2014 96  96 - 112 mEq/L Final  . CO2 04/02/2014 28  19 - 32 mEq/L Final  . Glucose, Bld 04/02/2014 241* 70 - 99 mg/dL Final  . BUN 04/02/2014 25* 6 - 23 mg/dL Final  . Creatinine, Ser 04/02/2014 1.7* 0.4 - 1.5 mg/dL Final  . Total Bilirubin 04/02/2014 0.5  0.2 - 1.2 mg/dL Final  . Alkaline Phosphatase 04/02/2014 120* 39 - 117 U/L Final  . AST 04/02/2014 33  0 - 37 U/L Final  . ALT 04/02/2014 32  0 - 53 U/L Final  . Total Protein 04/02/2014 8.1  6.0 - 8.3 g/dL Final  . Albumin 04/02/2014 4.0  3.5 - 5.2 g/dL Final  . Calcium 04/02/2014 9.3  8.4 - 10.5 mg/dL Final  . GFR 04/02/2014 44.73* >60.00 mL/min Final

## 2014-04-02 NOTE — Patient Instructions (Addendum)
Please check blood sugars before each meal, also at least half the time about 2 hours after any meal and daily on waking up. Please bring blood sugar monitor to each visit  Lantus 70 units twice daily  U-500 insulin 20 units in am and 26 at supper but don't take over 32 units

## 2014-04-03 ENCOUNTER — Other Ambulatory Visit: Payer: Medicaid Other

## 2014-04-03 ENCOUNTER — Ambulatory Visit
Admission: RE | Admit: 2014-04-03 | Discharge: 2014-04-03 | Disposition: A | Discharge status: Home / Self Care | Payer: Medicaid Other | Source: Ambulatory Visit | Attending: Neurology | Admitting: Neurology

## 2014-04-03 DIAGNOSIS — IMO0001 Reserved for inherently not codable concepts without codable children: Secondary | ICD-10-CM

## 2014-04-03 DIAGNOSIS — R42 Dizziness and giddiness: Secondary | ICD-10-CM

## 2014-04-03 DIAGNOSIS — E1165 Type 2 diabetes mellitus with hyperglycemia: Principal | ICD-10-CM

## 2014-04-08 ENCOUNTER — Other Ambulatory Visit: Payer: Self-pay | Admitting: Endocrinology

## 2014-04-09 ENCOUNTER — Telehealth: Payer: Self-pay | Admitting: *Deleted

## 2014-04-09 NOTE — Telephone Encounter (Signed)
Called patient to check on him his wife since his results was normal.  stated his condition is has improved since he was last seen. She also stated he has stopped all his activity . And he also stopped taking the atenolol.

## 2014-04-09 NOTE — Telephone Encounter (Signed)
She states she feels like the medication was causing his problems

## 2014-04-09 NOTE — Telephone Encounter (Signed)
Message copied by Joellen Jersey on Wed Apr 09, 2014  2:44 PM ------      Message from: Rance Muir R      Created: Fri Apr 04, 2014  3:41 PM                   ----- Message -----         From: Rosalin Hawking, MD         Sent: 04/03/2014  11:50 PM           To: [facility] Triage Pool            Please let pt know that his carotid doppler showed no flow limiting stenosis at neck vessels. Thank you.            Rosalin Hawking, MD PhD      Stroke Neurology      04/03/2014      11:50 PM             ------

## 2014-04-29 ENCOUNTER — Other Ambulatory Visit: Payer: Self-pay | Admitting: Endocrinology

## 2014-04-30 ENCOUNTER — Encounter (INDEPENDENT_AMBULATORY_CARE_PROVIDER_SITE_OTHER): Payer: Self-pay

## 2014-04-30 ENCOUNTER — Ambulatory Visit (INDEPENDENT_AMBULATORY_CARE_PROVIDER_SITE_OTHER): Payer: Medicaid Other | Admitting: Neurology

## 2014-04-30 VITALS — BP 114/82 | HR 97 | Wt 255.6 lb

## 2014-04-30 DIAGNOSIS — R42 Dizziness and giddiness: Secondary | ICD-10-CM

## 2014-04-30 DIAGNOSIS — E1143 Type 2 diabetes mellitus with diabetic autonomic (poly)neuropathy: Secondary | ICD-10-CM

## 2014-04-30 NOTE — Patient Instructions (Addendum)
-   continue ASA and lipitor for stroke prevention - your condition most consistent with orthostatic hypotension due to autonomic dysfunction secondary to DM neuropathy - better DM control is the key for your condition - for orthostatic hypotension, keep hyddrated, wear compression stockings, head of bed 20-30 degree and discuss with your PCP regarding the new FDA-approved medication called droxidopa.  - follow up with PCP and Dr. Dwyane Dee for stroke risk factor modifciation - diet control and check glucose and BP at home as iinstructed by Dr. Donalee Citrin - will discuss with [employer] if they still not approve MRA, we will do TCD - RTC in 3 months.

## 2014-04-30 NOTE — Progress Notes (Signed)
STROKE NEUROLOGY FOLLOW UP NOTE  NAME: Ethan Campbell DOB: 31-Dec-1960  REASON FOR VISIT: stroke follow up HISTORY FROM: chart and pt  Today we had the pleasure of seeing Ethan Campbell in follow-up at our Neurology Clinic. Pt was accompanied by wife and daughter.   History Summary Ethan Campbell is a 53 y.o. male with PMH of hypertension, hyperlipidemia, uncontrolled diabetes and chronic kidney disease was seen on 03/17/14 in clinic for dizzy and passing out spells for about a year. Outpatient workup for cardiac source, was told to have mild lobular regurgitation and congestive heart failure, but not able to explain the spells. Had MRI brain did not show acute stroke but ischemic white matter changes. His dizziness and passing out happened most time with sitting up or standing up associated with low BP, concerning for diabetic neuropathy with autonomic dysfunction. In clinic orthostatic vitals showed down trending BPs. His A1C 14.3. Recommended to have MRA, EEG, and carotid doppler, as well as compressing stockings and tight control glucose.   Interval History During the interval time, the patient has been doing better, no passing out episodes. He still feel some dizziness on standing or sitting up. His carotid doppler unremarkable, but MRA was not done yet. He went to see Dr. Dwyane Dee endocrinologist for better DM control, but his glucose still up and down and repeat A1C still high at 14.3. His atenolol has been stopped and his BP at home 140-150s, today in clinic 141/82. He has lost 30 lbs since last visit intentionally. Still complains of tingling and numbness at bilateral fingertips and toes.   REVIEW OF SYSTEMS: Full 14 system review of systems performed and notable only for those listed below and in HPI above, all others are negative:  Fatigue, hearing loss, ringing in ears, eye itching, blurred vision, cough, chest pain, excessive thirst, constipation, insomnia, apnea, daytime sleepiness,  snoring, frequent urination, joint pain, back pain, muscle cramps, walking difficulties, neck pain, rash, moles, itching, memory loss, dizziness, numbness, weakness, tremors, passing out, confusion, agitation.  The following represents the patient's updated allergies and side effects list: No Known Allergies  Labs since last visit of relevance include the following: Results for orders placed in visit on 04/02/14  HEMOGLOBIN A1C      Result Value Ref Range   Hemoglobin A1C 14.3 (*) 4.6 - 6.5 %  COMPREHENSIVE METABOLIC PANEL      Result Value Ref Range   Sodium 134 (*) 135 - 145 mEq/L   Potassium 4.2  3.5 - 5.1 mEq/L   Chloride 96  96 - 112 mEq/L   CO2 28  19 - 32 mEq/L   Glucose, Bld 241 (*) 70 - 99 mg/dL   BUN 25 (*) 6 - 23 mg/dL   Creatinine, Ser 1.7 (*) 0.4 - 1.5 mg/dL   Total Bilirubin 0.5  0.2 - 1.2 mg/dL   Alkaline Phosphatase 120 (*) 39 - 117 U/L   AST 33  0 - 37 U/L   ALT 32  0 - 53 U/L   Total Protein 8.1  6.0 - 8.3 g/dL   Albumin 4.0  3.5 - 5.2 g/dL   Calcium 9.3  8.4 - 10.5 mg/dL   GFR 44.73 (*) >60.00 mL/min    The neurologically relevant items on the patient's problem list were reviewed on today's visit.  Neurologic Examination  A problem focused neurological exam (12 or more points of the single system neurologic examination, vital signs counts as 1 point, cranial nerves count for 8  points) was performed.  Blood pressure 114/82, pulse 97, weight 255 lb 9.6 oz (115.939 kg).  General - overweight, well developed, in no apparent distress.  Ophthalmologic - Sharp disc margins OU.  Cardiovascular - Regular rate and rhythm with no murmur.  Neck - supple, no nuchal rigidity .  Mental Status -  Level of arousal and orientation to time, place, and person were intact.  Language including expression, naming, repetition, comprehension was assessed and found intact.  Cranial Nerves II - XII -  II - Visual field intact OU.  III, IV, VI - Extraocular movements intact.  V  - Facial sensation intact bilaterally.  VII - Facial movement intact bilaterally.  VIII - Hearing & vestibular intact bilaterally.  X - Palate elevates symmetrically.  XI - Chin turning & shoulder shrug intact bilaterally.  XII - Tongue protrusion intact.  Motor Strength - The patient's strength was normal in all extremities and pronator drift was absent. Bulk was normal and fasciculations were absent.  Motor Tone - Muscle tone was assessed at the neck and appendages and was normal.  Reflexes - The patient's reflexes were 1+ in all extremities and he had no pathological reflexes.  Sensory - Light touch, temperature/pinprick symmetrical, but vibration and proprioception diminished below ankle.  Coordination - The patient had normal movements in the hands and feet with no ataxia or dysmetria. Tremor was absent.  Gait and Station - The patient's transfers, posture, gait, station, and turns were observed as normal, but recommended to walk and stand up slow..   Data reviewed: I personally reviewed the images and agree with the radiology interpretations.  MRI brain - 02/27/2014 impression: 1. No evidence of acute intracranial abnormality or mass. 2. Moderate age advanced white matter disease. This is nonspecific but is most often seen in the setting of chronic small vessel ischemia, with other considerations including sequelae of prior,, migraines, pes planus, and hypercoagulable state. Demyelinating disease is also possible, however the appearance is not particularly suggestive of this. 3. Two fourths I of remote microhemorrhages in the high frontal loss, possibly secondary to remote small vessel infarcts or trauma.  EEG 03/19/14 - This is a normal EEG recording in the waking state.  No evidence of ictal or interictal discharges are seen.  CUS 04/03/14 - 1. Mild bilateral carotid atherosclerotic vascular disease again  noted. No flow-limiting stenosis. No significant change prior exam.  2. Vertebrals  patent with antegrade flow.  2D echo 05/11/12 - EF 60-65%  Component     Latest Ref Rng 05/20/2013 08/21/2013 12/30/2013 04/02/2014  Cholesterol     0 - 200 mg/dL 150  154   Triglycerides     0.0 - 149.0 mg/dL 265.0 (H)  324.0 (H)   HDL     >39.00 mg/dL 37.30 (L)  36.50 (L)   VLDL     0.0 - 40.0 mg/dL 53.0 (H)  64.8 (H)   LDL (calc)     0 - 99 mg/dL   53   Total CHOL/HDL Ratio      4  4   NonHDL        117.50   Hemoglobin A1C     4.6 - 6.5 % 14.3 (H) 12.6 (H) 14.6 (H) 14.3 (H)    Assessment: As you may recall, he is a 53 y.o. Caucasian male with PMH of hypertension, hyperlipidemia, uncontrolled diabetes and chronic kidney disease followed up in clinic. From his description of the spells, almost all related to positioning changes from  sitting to standing, with prodromal symptoms of syncope and presyncope. Symptoms relieved when sitting down. Orthostatic vitals also trending down when stand up. This spells are most consistent with syncope and presyncope, most likely due to orthostatic hypotension. In the clinic, we checked orthostatic vitals, there is a trending for orthostatic hypotension. He had uncontrolled diabetes, his A1c last checked was 12.6 indicating poorly controlled. He had diabetic neuropathy with autonomic dysfunction, which explains his orthostatic hypotension as well as impotance, and finger and toe numbness / tingling.    He also had multiple stroke risk factors including hypertension, hyperlipidemia, diabetes, stroke workup showed negative CUS but MRA has not done yet. EEG negative. Will check status of MRA. A1C still at 14.3 and LDL under control.  Plan:  - continue ASA and lipitor for stroke prevention - for orthostatic hypotension, keep hyddrated, wear compression stockings, head of bed 20-30 degree and discuss with your PCP regarding the new FDA-approved medication called droxidopa.  - follow up with PCP and Dr. Dwyane Dee for stroke risk factor modifciation - diet control  and check glucose and BP at home as iinstructed by Dr. Donalee Citrin - will discuss with [employer] if they still not approve MRA, we will do TCD - RTC in 3 months.   No orders of the defined types were placed in this encounter.    No orders of the defined types were placed in this encounter.    Patient Instructions  - continue ASA and lipitor for stroke prevention - your condition most consistent with orthostatic hypotension due to autonomic dysfunction secondary to DM neuropathy - better DM control is the key for your condition - for orthostatic hypotension, keep hyddrated, wear compression stockings, head of bed 20-30 degree and discuss with your PCP regarding the new FDA-approved medication called droxidopa.  - follow up with PCP and Dr. Dwyane Dee for stroke risk factor modifciation - diet control and check glucose and BP at home as iinstructed by Dr. Donalee Citrin - will discuss with [employer] if they still not approve MRA, we will do TCD - RTC in 3 months.   Rosalin Hawking, MD PhD [facility] [address], [place], [place] [address] [phone]

## 2014-05-01 ENCOUNTER — Telehealth: Payer: Self-pay | Admitting: *Deleted

## 2014-05-01 ENCOUNTER — Encounter: Payer: Self-pay | Admitting: Neurology

## 2014-05-01 DIAGNOSIS — E1143 Type 2 diabetes mellitus with diabetic autonomic (poly)neuropathy: Secondary | ICD-10-CM | POA: Insufficient documentation

## 2014-05-01 NOTE — Telephone Encounter (Signed)
Message copied by Joellen Jersey on Thu May 01, 2014 10:27 AM ------      Message from: Rosalin Hawking      Created: Thu May 01, 2014 12:25 AM       Could you please send a copy to his PCP? thanks ------

## 2014-05-01 NOTE — Telephone Encounter (Signed)
Sent office note via epic.

## 2014-05-14 ENCOUNTER — Other Ambulatory Visit: Payer: Self-pay | Admitting: Neurology

## 2014-05-14 ENCOUNTER — Telehealth: Payer: Self-pay | Admitting: *Deleted

## 2014-05-14 DIAGNOSIS — R42 Dizziness and giddiness: Secondary | ICD-10-CM

## 2014-05-14 NOTE — Progress Notes (Signed)
Hi, Tashia:   For this pt, his MRA was not approved by [employer]. I called [employer] and they said they will fax me the denial reasons but I have not got one. But anyways, I will change MRA to TCD. I called pt but no one picked up the phone. So, could you please call this pt and let him know that his MRA was denied by [employer] and I will order his the ultrasound of his brain vessels instead. They will call him for the TCD appointment. Thanks a lot.   Rosalin Hawking, MD PhD  Stroke Neurology  05/14/2014  9:01 AM

## 2014-05-14 NOTE — Telephone Encounter (Signed)
Called patient to inform him that MRA was denied by his insurance, informed patient that a TCD was ordered instead, patient will receive a call to schedule.

## 2014-05-14 NOTE — Progress Notes (Signed)
Called patient back and left voice message explaining the changes and to call back with any questions or concerns.

## 2014-05-29 NOTE — Telephone Encounter (Signed)
Patient calling to state that no one has called to get his TCD scheduled, please return call and advise.

## 2014-05-29 NOTE — Telephone Encounter (Signed)
Patient calling back because he has not been set up for his TCD, Collie Siad please advise.

## 2014-06-03 NOTE — Telephone Encounter (Signed)
Left patient a voice message that we are awaiting approval from Medicaid and if he wants to go ahead and schedule we can do that, but may have to cancel if they do not approve.

## 2014-06-18 ENCOUNTER — Telehealth: Payer: Self-pay | Admitting: Neurology

## 2014-06-18 NOTE — Telephone Encounter (Signed)
Spoke with patient's wife and she states that patient is having bad headaches at the base of his neck, having bad dizzy spells, WID please advise.

## 2014-06-18 NOTE — Telephone Encounter (Signed)
Pt was denied by medicaid to have the MRA.  He is having dizzy spells and bad headaches since Monday afternoon.  Not sure what to do. Please call and advise.

## 2014-06-18 NOTE — Telephone Encounter (Signed)
I have called patient, he complains of similar dizziness, lightheadedness, when standing up, similar to his complains in October 2015 visit,  He has poorly controlled diabetes, A1c 14, with diabetic peripheral neuropathy, autonomic dysfunction,  His complaint is most consistent with orthostatic blood pressure changes,  I have advised him well control his diabetes, keep well hydration, counteractive maneuver from orthostatic blood pressure changes

## 2014-06-24 ENCOUNTER — Telehealth: Payer: Self-pay | Admitting: Neurology

## 2014-06-24 NOTE — Telephone Encounter (Signed)
Pt wants Dr. Erlinda Hong know that the medicine you wanted him to start, Dr. Freida Busman does not want him to take it.  Dr. Freida Busman put him on Midodrine 2 1/2mg s.  He started him on the lowest dose.  If you need to speak with him please give a call back.

## 2014-06-24 NOTE — Telephone Encounter (Signed)
Dr Freida Busman changed patient's medication.

## 2014-06-24 NOTE — Telephone Encounter (Signed)
Talked with pt over the phone. Dr. Freida Busman put him on midodrine for orthostatic hypotension, which is good for him although the dose is still low. Dr. Freida Busman will titrating up the dose depends on pt responses to the medication. I totally agree with the plan. Pt understands to call Dr. Freida Busman and report to him about his symptoms while on this medication.  Rosalin Hawking, MD PhD Stroke Neurology 06/24/2014 4:28 PM

## 2014-07-09 ENCOUNTER — Other Ambulatory Visit: Payer: Self-pay | Admitting: Endocrinology

## 2014-07-14 LAB — HM DIABETES EYE EXAM

## 2014-07-15 ENCOUNTER — Encounter: Payer: Self-pay | Admitting: *Deleted

## 2014-07-22 ENCOUNTER — Encounter: Payer: Self-pay | Admitting: Endocrinology

## 2014-08-06 ENCOUNTER — Ambulatory Visit: Payer: Medicaid Other | Admitting: Neurology

## 2014-08-20 ENCOUNTER — Encounter: Payer: Self-pay | Admitting: Neurology

## 2014-08-20 ENCOUNTER — Ambulatory Visit (INDEPENDENT_AMBULATORY_CARE_PROVIDER_SITE_OTHER): Payer: Medicaid Other | Admitting: Neurology

## 2014-08-20 VITALS — BP 146/89 | HR 73 | Ht 69.0 in | Wt 283.0 lb

## 2014-08-20 DIAGNOSIS — R55 Syncope and collapse: Secondary | ICD-10-CM

## 2014-08-20 DIAGNOSIS — E1143 Type 2 diabetes mellitus with diabetic autonomic (poly)neuropathy: Secondary | ICD-10-CM

## 2014-08-20 DIAGNOSIS — R42 Dizziness and giddiness: Secondary | ICD-10-CM

## 2014-08-20 NOTE — Patient Instructions (Signed)
-   continue ASA and lipitor for stroke prevention - discontinue pravastatin as it is duplicate from other statin - continue to follow up with Dr. Dwyane Dee for DM control - Follow up with your primary care physician for stroke risk factor modification. Recommend maintain blood pressure goal <130/80, diabetes with hemoglobin A1c goal below 6.5% and lipids with LDL cholesterol goal below 70 mg/dL.  - continue to follow up with Dr. Sherrie Sport for orthostatic hypotension. Since you still have the episodes, may consider increase midodrine to 2.5mg  twice a day with the evening dose at 2-3 pm. - check BP and glucose at home - follow up in 3 months.

## 2014-08-20 NOTE — Progress Notes (Signed)
STROKE NEUROLOGY FOLLOW UP NOTE  NAME: Ethan Campbell DOB: 1961-06-15  REASON FOR VISIT: stroke follow up HISTORY FROM: chart and pt  Today we had the pleasure of seeing Ethan Campbell in follow-up at our Neurology Clinic. Pt was accompanied by wife and daughter.   History Summary Ethan Campbell is a 54 y.o. male with PMH of hypertension, hyperlipidemia, uncontrolled diabetes and chronic kidney disease was seen on 03/17/14 in clinic for dizzy and passing out spells for about a year. Outpatient workup for cardiac source, was told to have mild lobular regurgitation and congestive heart failure, but not able to explain the spells. Had MRI brain did not show acute stroke but ischemic white matter changes. His dizziness and passing out happened most time with sitting up or standing up associated with low BP, concerning for diabetic neuropathy with autonomic dysfunction. In clinic orthostatic vitals showed down trending BPs. His A1C 14.3. Recommended to have MRA, EEG, and carotid doppler, as well as compressing stockings and tight control glucose.   05/01/15 follow up - the patient has been doing better, no passing out episodes. He still feel some dizziness on standing or sitting up. His carotid doppler unremarkable, but MRA was not done yet. He went to see Dr. Dwyane Dee endocrinologist for better DM control, but his glucose still up and down and repeat A1C still high at 14.3. His atenolol has been stopped and his BP at home 140-150s, today in clinic 141/82. He has lost 30 lbs since last visit intentionally. Still complains of tingling and numbness at bilateral fingertips and toes.  Interval History During the interval time, he was doing much better. He was started by his PCP on midodrine 2.5mg  in am. He stated that he takes once a day. He also takes amlodipine 10mg  in am. However, PCP does not want him to be on droxidopa. He stated that his syncope and near syncope episodes are much less but still happens  intermittently. His BP 146/89 in clinic today.  REVIEW OF SYSTEMS: Full 14 system review of systems performed and notable only for those listed below and in HPI above, all others are negative:  Insomnia, joint pain, aching muscles, neck pain, HA, numbness, passing out, agitation.  The following represents the patient's updated allergies and side effects list: No Known Allergies  Labs since last visit of relevance include the following: Results for orders placed or performed in visit on 07/22/14  HM DIABETES EYE EXAM  Result Value Ref Range   HM Diabetic Eye Exam  No Retinopathy    The neurologically relevant items on the patient's problem list were reviewed on today's visit.  Neurologic Examination  A problem focused neurological exam (12 or more points of the single system neurologic examination, vital signs counts as 1 point, cranial nerves count for 8 points) was performed.  Blood pressure 146/89, pulse 73, height 5\' 9"  (1.753 m), weight 283 lb (128.368 kg).  General - overweight, well developed, in no apparent distress.  Ophthalmologic - Sharp disc margins OU.  Cardiovascular - Regular rate and rhythm with no murmur.  Neck - supple, no nuchal rigidity .  Mental Status -  Level of arousal and orientation to time, place, and person were intact.  Language including expression, naming, repetition, comprehension was assessed and found intact.  Cranial Nerves II - XII -  II - Visual field intact OU.  III, IV, VI - Extraocular movements intact.  V - Facial sensation intact bilaterally.  VII - Facial movement intact bilaterally.  VIII - Hearing & vestibular intact bilaterally.  X - Palate elevates symmetrically.  XI - Chin turning & shoulder shrug intact bilaterally.  XII - Tongue protrusion intact.  Motor Strength - The patient's strength was normal in all extremities and pronator drift was absent. Bulk was normal and fasciculations were absent.  Motor Tone - Muscle tone was  assessed at the neck and appendages and was normal.  Reflexes - The patient's reflexes were 1+ in all extremities and he had no pathological reflexes.  Sensory - Light touch, temperature/pinprick symmetrical, but vibration and proprioception diminished below ankle.  Coordination - The patient had normal movements in the hands and feet with no ataxia or dysmetria. Tremor was absent.  Gait and Station - The patient's transfers, posture, gait, station, and turns were observed as normal, but recommended to walk and stand up slow..   Data reviewed: I personally reviewed the images and agree with the radiology interpretations.  MRI brain - 02/27/2014 impression: 1. No evidence of acute intracranial abnormality or mass. 2. Moderate age advanced white matter disease. This is nonspecific but is most often seen in the setting of chronic small vessel ischemia, with other considerations including sequelae of prior,, migraines, pes planus, and hypercoagulable state. Demyelinating disease is also possible, however the appearance is not particularly suggestive of this. 3. Two fourths I of remote microhemorrhages in the high frontal loss, possibly secondary to remote small vessel infarcts or trauma.  EEG 03/19/14 - This is a normal EEG recording in the waking state.  No evidence of ictal or interictal discharges are seen.  CUS 04/03/14 - 1. Mild bilateral carotid atherosclerotic vascular disease again  noted. No flow-limiting stenosis. No significant change prior exam.  2. Vertebrals patent with antegrade flow.  2D echo 05/11/12 - EF 60-65%  Component     Latest Ref Rng 05/20/2013 08/21/2013 12/30/2013 04/02/2014  Cholesterol     0 - 200 mg/dL 150  154   Triglycerides     0.0 - 149.0 mg/dL 265.0 (H)  324.0 (H)   HDL     >39.00 mg/dL 37.30 (L)  36.50 (L)   VLDL     0.0 - 40.0 mg/dL 53.0 (H)  64.8 (H)   LDL (calc)     0 - 99 mg/dL   53   Total CHOL/HDL Ratio      4  4   NonHDL        117.50   Hemoglobin  A1C     4.6 - 6.5 % 14.3 (H) 12.6 (H) 14.6 (H) 14.3 (H)    Assessment: As you may recall, he is a 54 y.o. Caucasian male with PMH of hypertension, hyperlipidemia, uncontrolled diabetes and chronic kidney disease followed up in clinic. From his description of the spells, almost all related to positioning changes from sitting to standing, with prodromal symptoms of syncope and presyncope. Symptoms relieved when sitting down. Orthostatic vitals also trending down when stand up. This spells are most consistent with syncope and presyncope, most likely due to orthostatic hypotension. In the clinic, we checked orthostatic vitals, there is a trending for orthostatic hypotension. He had uncontrolled diabetes, his A1c last checked was 12.6 indicating poorly controlled DM. He had diabetic neuropathy with autonomic dysfunction, which explains his orthostatic hypotension as well as impotance, and finger and toe numbness / tingling. He was given midodrine and feels much better with less syncope episodes. However, he takes midodrine and amlodipine together.   He also had multiple stroke risk factors  including hypertension, hyperlipidemia, diabetes, stroke workup showed negative CUS. EEG negative. A1C still at 14.3 and LDL under control.  Plan:  - continue ASA and lipitor for stroke prevention - no need to continue pravastatin  - follow up with PCP and Dr. Dwyane Dee for DM and stroke risk factor modification - continue to follow up with PCP for orthostatic hypotension. May consider increase midodrin to 2.5mg  bid depending on response. - check BP at home as instructed by Dr. Donalee Citrin - RTC in 3 months.   No orders of the defined types were placed in this encounter.    No orders of the defined types were placed in this encounter.    Patient Instructions  - continue ASA and lipitor for stroke prevention - discontinue pravastatin as it is duplicate from other statin - continue to follow up with Dr. Dwyane Dee for DM  control - Follow up with your primary care physician for stroke risk factor modification. Recommend maintain blood pressure goal <130/80, diabetes with hemoglobin A1c goal below 6.5% and lipids with LDL cholesterol goal below 70 mg/dL.  - continue to follow up with Dr. Sherrie Sport for orthostatic hypotension. Since you still have the episodes, may consider increase midodrine to 2.5mg  twice a day with the evening dose at 2-3 pm. - check BP and glucose at home - follow up in 3 months.   Rosalin Hawking, MD PhD [facility] [address], [place], [place] [address] [phone]

## 2014-09-11 ENCOUNTER — Other Ambulatory Visit: Payer: Self-pay | Admitting: Endocrinology

## 2014-10-13 LAB — BASIC METABOLIC PANEL: CREATININE: 2 mg/dL — AB (ref <=1.3)

## 2014-10-13 LAB — LIPID PANEL: LDL CALC: 83 mg/dL

## 2014-10-13 LAB — HEMOGLOBIN A1C: HEMOGLOBIN A1C: 15 % — AB (ref 4.0–6.0)

## 2014-11-06 ENCOUNTER — Ambulatory Visit (INDEPENDENT_AMBULATORY_CARE_PROVIDER_SITE_OTHER): Payer: Medicaid Other | Admitting: Endocrinology

## 2014-11-06 ENCOUNTER — Encounter: Payer: Self-pay | Admitting: Endocrinology

## 2014-11-06 ENCOUNTER — Other Ambulatory Visit: Payer: Self-pay

## 2014-11-06 VITALS — BP 124/70 | HR 69 | Temp 98.3°F | Ht 69.0 in | Wt 279.2 lb

## 2014-11-06 DIAGNOSIS — E1065 Type 1 diabetes mellitus with hyperglycemia: Secondary | ICD-10-CM | POA: Diagnosis not present

## 2014-11-06 DIAGNOSIS — N183 Chronic kidney disease, stage 3 unspecified: Secondary | ICD-10-CM

## 2014-11-06 DIAGNOSIS — E782 Mixed hyperlipidemia: Secondary | ICD-10-CM

## 2014-11-06 DIAGNOSIS — IMO0002 Reserved for concepts with insufficient information to code with codable children: Secondary | ICD-10-CM

## 2014-11-06 MED ORDER — LIRAGLUTIDE 18 MG/3ML ~~LOC~~ SOPN: PEN_INJECTOR | SUBCUTANEOUS | Status: DC

## 2014-11-06 NOTE — Patient Instructions (Addendum)
U-500 insulin: Take 12 units before at supper and 12 at bedtime around MN or 1 am  If sugar at bedtime pver 250 go up 2 units   Need to change Lantus when out

## 2014-11-06 NOTE — Progress Notes (Signed)
Patient ID: Ethan Campbell, male   DOB: Sep 07, 1960, 54 y.o.   MRN: [id number]   Reason for Appointment: Diabetes follow-up   History of Present Illness   Diagnosis: Type 2 DIABETES MELITUS, date of diagnosis: 2000    Previous history: he has been on insulin for several years with consistently poor control Has been requiring large doses of insulin for his diabetes but A1c has been persistently high Blood sugars did not improve significantly even with trying Byetta and Victoza In 2014 he was switched from NovoLog to U-500 insulin but not clear if he has had improvement in control except with fasting readings His prior A1c was 14.0 in 5/14 and he had educational discussions with diabetes educator and dietitian in 6/14   Recent history:  Insulin regimen: U-500 insulin: 18 units for breakfast if eating, 18 before lunch, 25 before supper, 30 minutes a.c.  Lantus insulin:  70 a.m.-- 70 p.m.    He has not been seen in follow-up since 03/2014 At that time he was recommended an insulin pump but he did not follow-up on this Also he was recommended Invokana since renal function was improved but he has not taken this His blood sugars are still very difficult to control  He thinks his A1c from PCP will 15%  Also has not been able to get his Victoza from the drug store for 2 months.  He had previously been able to get this without any problems with his insurance but pharmacy told him it was not covered.  He thinks his blood sugars are higher with stopping this  Not clear again and this is a Medicaid issue He is otherwise compliant with his Lantus and U-500 insulin  Blood sugar patterns and problems identified:  He is having consistently high readings in the mornings except once  Blood sugars may be somewhat lower at times in the afternoons including a couple of readings below 70 but not consistent  Blood sugars are variable after his evening meal with only one significantly high reading  He has  had 2 documented low blood sugars between 1-5 AM as low as 49  Overall average blood sugar is 277  He does take 2-5 units extra U-500 insulin as directed when his blood sugars are high     Oral hypoglycemic drugs: none  (renal dysfunction)     Side effects from medications: None  Proper timing of medications in relation to meals: Yes.    Meals: 3 meals per day.eating generally at  1 pm and 6-8 pm. Occasionally high fat meals       Monitors blood glucose:  0.8 times a day.    Glucometer:  Accu-Chek     Blood Glucose readings from meter download (not clear which are postprandial):  AVERAGE 277, range 49- 543  PRE-MEAL  morning   2-4 PM  Dinner  PCS   overnight   Glucose range:  70-464   59-06   105, 418   96-330   56, 49   Mean/median:        Last dietitian visit: 6/14     Physical activity: exercise: some yardwork or other activities outside  Wt Readings from Last 3 Encounters:  11/06/14 279 lb 4 oz (126.667 kg)  08/20/14 283 lb (128.368 kg)  04/30/14 255 lb 9.6 oz (115.939 kg)    LABS: A1c reportedly 15 in 4/16  Lab Results  Component Value Date   HGBA1C 14.3* 04/02/2014   HGBA1C 14.6* 12/30/2013   HGBA1C  12.6* 08/21/2013   Lab Results  Component Value Date   MICROALBUR 7.0* 12/30/2013   LDLCALC 53 12/30/2013   CREATININE 1.7* 04/02/2014    No visits with results within 1 Week(s) from this visit. Latest known visit with results is:  Abstract on 07/15/2014  Component Date Value Ref Range Status  . HM Diabetic Eye Exam 07/14/2014 Retinopathy* No Retinopathy Final      Medication List       This list is accurate as of: 11/06/14 11:59 PM.  Always use your most recent med list.               ACCU-CHEK AVIVA PLUS test strip  Generic drug:  glucose blood  TEST BLOOD SUGAR 4 TIMES DAILY.     amLODipine 10 MG tablet  Commonly known as:  NORVASC  Take 10 mg by mouth daily.     aspirin 81 MG EC tablet  Take 81 mg by mouth daily.     atorvastatin 80 MG  tablet  Commonly known as:  LIPITOR  TAKE 1 TABLET DAILY.     DULoxetine 60 MG capsule  Commonly known as:  CYMBALTA  Take 60 mg by mouth daily.     fenofibrate 145 MG tablet  Commonly known as:  TRICOR  Take 145 mg by mouth daily.     FLUoxetine 20 MG capsule  Commonly known as:  PROZAC  Take 20 mg by mouth daily.     gabapentin 300 MG capsule  Commonly known as:  NEURONTIN  Take 300 mg by mouth 3 (three) times daily.     HUMULIN R 500 UNIT/ML Soln injection  Generic drug:  insulin regular human CONCENTRATED  AS DIRECTED UP TO 35 UNITS 3 TIMES DAILY.     HUMULIN R U-500 (CONCENTRATED) [place]  Inject into the skin. 15 breakfast 18 and lunch and 20 at supper     HYDROcodone-acetaminophen 7.5-325 MG per tablet  Commonly known as:  NORCO  Take 1 tablet by mouth every 6 (six) hours as needed for moderate pain.     LANTUS OPTICLIK 100 UNIT/ML cartridge  Generic drug:  Insulin Glargine  Inject 60 Units into the skin 2 (two) times daily.     Liraglutide 18 MG/3ML Sopn  Commonly known as:  VICTOZA  INJECT 1.8 MG SUBCUTANEOUSLY ONCE DAILY.     LITE TOUCH INS SYR .5CC/29G 29G X 1/2" 0.5 ML Misc  Generic drug:  INSULIN SYRINGE .5CC/29G  USE AS DIRECTED TWICE DAILY.     midodrine 5 MG tablet  Commonly known as:  PROAMATINE  Take 5 mg by mouth 3 (three) times daily with meals.     NON FORMULARY  C-PAP Machine. Use as directed at bedtime.     potassium chloride 10 MEQ tablet  Commonly known as:  K-DUR  TAKE 2 TABLETS BY MOUTH ONCE DAILY.     Vitamin D (Ergocalciferol) 50000 UNITS Caps capsule  Commonly known as:  DRISDOL  TAKE 1 CAPSULE TWICE A WEEK        Allergies: No Known Allergies  Past Medical History  Diagnosis Date  . Sleep apnea     Dr. Brandon Melnick  . Edema     Venous insufficiency  . Proteinuria   . Obesity   . Diabetes mellitus   . Hypertension     Benign  . Precordial pain 12/23/09    Nuclear stress; no ischemia; EF 60%  . Hyperlipidemia   . History of  echocardiogram 12/09  EF 60-65%; mild increased RV; 38 mmHg  . Syncope and collapse     Past Surgical History  Procedure Laterality Date  . 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 Heart attack Mother 28  . Cancer Mother     Breast  . Heart attack Father 43  . Cancer Sister     Breast  . Cancer Sister     Colon    Social History:  reports that he has quit smoking. He has never used smokeless tobacco. He reports that he does not drink alcohol or use illicit drugs.  Review of Systems:  Blackout episodes with leg weakness. This occurs for only a minute or so and during this time he cannot hear or talk but his vision is fairly good; it is followed by headache. BP and glucose during the episode have been checked by his wife and are normal.  Neurologist feels that he may have low blood pressure  Hypertension: currently on atenolol, HCTZ, Lasix and blood pressure is low normal today No history of edema   Lipids: triglycerides are  relatively high despite taking fenofibrate. LDL is below 100, taking pravastatin  Lab Results  Component Value Date   CHOL 154 12/30/2013   HDL 36.50* 12/30/2013   LDLCALC 53 12/30/2013   LDLDIRECT 77.9 05/20/2013   TRIG 324.0* 12/30/2013   CHOLHDL 4 12/30/2013      CKD: His creatinine has ranged from 1.4-2.1 in the last 2 years , etiology unclear  Lab Results  Component Value Date   CREATININE 1.7* 04/02/2014    Examination:   BP 124/70 mmHg  Pulse 69  Temp(Src) 98.3 F (36.8 C) (Oral)  Ht 5\' 9"  (1.753 m)  Wt 279 lb 4 oz (126.667 kg)  BMI 41.22 kg/m2  SpO2 97%  Body mass index is 41.22 kg/(m^2).    ASSESSMENT/ PLAN:   Diabetes type 2  See history of present illness for detailed analysis of his blood sugar patterns and day-to-day management  Blood glucose control is consistently poor and possibly worse with stopping Victoza because of  insurance issues again As discussed in history of present illness he has markedly fluctuating blood sugars throughout the day However he appears to have tendency to relatively high readings before his first meal while at the same time he has 2 episodes of low blood sugars overnight even as late as 5 AM Since he is taking his evening U-500 insulin around 6-8 PM this is not adequately covering his fasting reading before breakfast Does not appear to be getting control of his fasting readings Lantus also Blood sugars are variable after his afternoon and evening meals based on his diet and action of the U-500 insulin  Recommendations made today:  Reduce suppertime U-500 insulin down to 12 units and take 12 units late at night around bedtime to help fasting blood sugars  He may need to increase his suppertime dose gradually and check more readings around bedtime  Consider insulin pump as his C-peptide level was below normal.  He will be scheduled to see the nurse educator  Restart Victoza, will need prior authorization most likely  Encouraged him to be more consistent with diet and exercise also  Change Lantus to [place] when current supply is finished  Insulin doses discussed with patient  Renal dysfunction: Etiology of this is unclear, will request records from the  PCP  Hyperlipidemia: Needs fasting lipids, however higher triglycerides may be from poor diabetes control   Patient Instructions  U-500 insulin: Take 12 units before at supper and 12 at bedtime around MN or 1 am  If sugar at bedtime pver 250 go up 2 units   Need to change Lantus when out      Counseling time over 50% of today's 25 minute visit  Ethan Campbell 11/07/2014, 11:10 AM    No visits with results within 1 Week(s) from this visit. Latest known visit with results is:  Abstract on 07/15/2014  Component Date Value Ref Range Status  . HM Diabetic Eye Exam 07/14/2014 Retinopathy* No Retinopathy Final

## 2014-11-06 NOTE — Progress Notes (Signed)
Pre visit review using our clinic review tool, if applicable. No additional management support is needed unless otherwise documented below in the visit note. 

## 2014-11-10 ENCOUNTER — Encounter: Payer: Self-pay | Admitting: *Deleted

## 2014-11-12 ENCOUNTER — Other Ambulatory Visit: Payer: Self-pay | Admitting: *Deleted

## 2014-11-12 ENCOUNTER — Encounter: Payer: Medicaid Other | Attending: Endocrinology | Admitting: Nutrition

## 2014-11-12 DIAGNOSIS — N189 Chronic kidney disease, unspecified: Secondary | ICD-10-CM | POA: Insufficient documentation

## 2014-11-12 DIAGNOSIS — E1122 Type 2 diabetes mellitus with diabetic chronic kidney disease: Secondary | ICD-10-CM | POA: Diagnosis not present

## 2014-11-12 DIAGNOSIS — Z713 Dietary counseling and surveillance: Secondary | ICD-10-CM | POA: Insufficient documentation

## 2014-11-12 DIAGNOSIS — Z794 Long term (current) use of insulin: Secondary | ICD-10-CM | POA: Diagnosis not present

## 2014-11-12 NOTE — Patient Instructions (Signed)
Test blood sugars before meals and at bed time. Keep record of readings and insulin doses. Call readings to me in one week.

## 2014-11-12 NOTE — Progress Notes (Signed)
Pt. Did not bring meter.   Typical day:  9AM up  Blood sugars usually 300s-350.  Take 70 lantus,  17u U500 R.  Today ate no breakfast due to high blood sugar  If blood sugar is not high, will have 1/2 sandwich with water  1PM: blood sugars are usually over 300.  Has 2 sandwiches, with 1 12 ounce glass of milk.  4:30PM bood sugars usuall 250s, 2 pieced of meat, or chicken 2-3 veg., one is starchy.  2 pieces of bread.  Water to drink Takes 20u of R  HS: 70u of Lantus.    Exercise:  Very little.  Will start doing yard work cutting grass, and trimming bushes  Plan: He was given a log book, and  ill keep a blood sugar record, with insulin doses, and call me reading in one week.

## 2014-11-15 ENCOUNTER — Other Ambulatory Visit: Payer: Self-pay | Admitting: Endocrinology

## 2014-11-24 ENCOUNTER — Ambulatory Visit: Payer: Medicaid Other | Admitting: Neurology

## 2014-11-25 ENCOUNTER — Ambulatory Visit: Payer: Medicaid Other | Admitting: Neurology

## 2014-11-27 ENCOUNTER — Encounter: Payer: Self-pay | Admitting: Neurology

## 2014-11-27 ENCOUNTER — Ambulatory Visit (INDEPENDENT_AMBULATORY_CARE_PROVIDER_SITE_OTHER): Payer: Medicaid Other | Admitting: Neurology

## 2014-11-27 VITALS — BP 114/67 | HR 66 | Wt 284.0 lb

## 2014-11-27 DIAGNOSIS — E785 Hyperlipidemia, unspecified: Secondary | ICD-10-CM | POA: Diagnosis not present

## 2014-11-27 DIAGNOSIS — R55 Syncope and collapse: Secondary | ICD-10-CM | POA: Diagnosis not present

## 2014-11-27 DIAGNOSIS — E1143 Type 2 diabetes mellitus with diabetic autonomic (poly)neuropathy: Secondary | ICD-10-CM

## 2014-11-27 DIAGNOSIS — I951 Orthostatic hypotension: Secondary | ICD-10-CM | POA: Diagnosis not present

## 2014-11-27 NOTE — Progress Notes (Signed)
STROKE NEUROLOGY FOLLOW UP NOTE  NAME: Ethan Campbell DOB: 11-11-1960  REASON FOR VISIT: stroke follow up HISTORY FROM: chart and pt  Today we had the pleasure of seeing Ethan Campbell in follow-up at our Neurology Clinic. Pt was accompanied by no one.   History Summary Ethan Campbell is a 54 y.o. male with PMH of hypertension, hyperlipidemia, uncontrolled diabetes and chronic kidney disease was seen on 03/17/14 in clinic for dizzy and passing out spells for about a year. Outpatient workup for cardiac source, was told to have mild lobular regurgitation and congestive heart failure, but not able to explain the spells. Had MRI brain did not show acute stroke but ischemic white matter changes. His dizziness and passing out happened most time with sitting up or standing up associated with low BP, concerning for diabetic neuropathy with autonomic dysfunction. In clinic orthostatic vitals showed down trending BPs. His A1C 14.3. Recommended to have MRA, EEG, and carotid doppler, as well as compressing stockings and tight control glucose.   05/01/15 follow up - the patient has been doing better, no passing out episodes. He still feel some dizziness on standing or sitting up. His carotid doppler unremarkable, but MRA was not done yet. He went to see Dr. Dwyane Dee endocrinologist for better DM control, but his glucose still up and down and repeat A1C still high at 14.3. His atenolol has been stopped and his BP at home 140-150s, today in clinic 141/82. He has lost 30 lbs since last visit intentionally. Still complains of tingling and numbness at bilateral fingertips and toes.  08/20/14 follow up - he was doing much better. He was started by his PCP on midodrine 2.5mg  in am. He stated that he takes once a day. He also takes amlodipine 10mg  in am. However, PCP does not want him to be on droxidopa. He stated that his syncope and near syncope episodes are much less but still happens intermittently. His BP 146/89 in  clinic today.  Interval History During the interval time, he has been doing well. No syncope or near syncope episodes. He stated that occasionally he felt dizzy when he stand up then he will bend down and it helps. His A1C on 10/13/14 was 15.0 and sugar level still fluctuating a lot. He follows with Dr. Dwyane Dee for DM and recently changed insulin regimen. For the last two weeks, his sugar less than 200 but this morning it was 55. His LDL on 10/13/14 was 83. He is on midodrine 5mg  tid now as per chart but pt stated that he only takes twice a day.   REVIEW OF SYSTEMS: Full 14 system review of systems performed and notable only for those listed below and in HPI above, all others are negative:  Insomnia, joint pain, aching muscles, neck pain, HA, numbness, passing out, agitation.  The following represents the patient's updated allergies and side effects list: No Known Allergies  Labs since last visit of relevance include the following: Results for orders placed or performed in visit on [id number]  Basic metabolic panel  Result Value Ref Range   Creatinine 2.0 (A) .6 - 1.3 mg/dL  Lipid panel  Result Value Ref Range   LDL Cholesterol 83 mg/dL  Hemoglobin A1c  Result Value Ref Range   Hgb A1c MFr Bld 15.0 (A) 4.0 - 6.0 %    The neurologically relevant items on the patient's problem list were reviewed on today's visit.  Neurologic Examination  A problem focused neurological exam (12 or more points of  the single system neurologic examination, vital signs counts as 1 point, cranial nerves count for 8 points) was performed.  Blood pressure 114/67, pulse 66, weight 284 lb (128.822 kg).   Lying 126/71 - 64, sitting 118/74 - 67 and standing 114/67 - 66  General - overweight, well developed, in no apparent distress.  Ophthalmologic - Sharp disc margins OU.  Cardiovascular - Regular rate and rhythm with no murmur.  Neck - supple, no nuchal rigidity .  Mental Status -  Level of arousal and  orientation to time, place, and person were intact.  Language including expression, naming, repetition, comprehension was assessed and found intact.  Cranial Nerves II - XII -  II - Visual field intact OU.  III, IV, VI - Extraocular movements intact.  V - Facial sensation intact bilaterally.  VII - Facial movement intact bilaterally.  VIII - Hearing & vestibular intact bilaterally.  X - Palate elevates symmetrically.  XI - Chin turning & shoulder shrug intact bilaterally.  XII - Tongue protrusion intact.  Motor Strength - The patient's strength was normal in all extremities and pronator drift was absent. Bulk was normal and fasciculations were absent.  Motor Tone - Muscle tone was assessed at the neck and appendages and was normal.  Reflexes - The patient's reflexes were 1+ in all extremities and he had no pathological reflexes.  Sensory - Light touch, temperature/pinprick symmetrical, but vibration and proprioception diminished below ankle.  Coordination - The patient had normal movements in the hands and feet with no ataxia or dysmetria. Tremor was absent.  Gait and Station - The patient's transfers, posture, gait, station, and turns were observed as normal, but recommended to walk and stand up slow..   Data reviewed: I personally reviewed the images and agree with the radiology interpretations.  MRI brain - 02/27/2014 impression: 1. No evidence of acute intracranial abnormality or mass. 2. Moderate age advanced white matter disease. This is nonspecific but is most often seen in the setting of chronic small vessel ischemia, with other considerations including sequelae of prior,, migraines, pes planus, and hypercoagulable state. Demyelinating disease is also possible, however the appearance is not particularly suggestive of this. 3. Two fourths I of remote microhemorrhages in the high frontal loss, possibly secondary to remote small vessel infarcts or trauma.  EEG 03/19/14 - This is a normal  EEG recording in the waking state.  No evidence of ictal or interictal discharges are seen.  CUS 04/03/14 - 1. Mild bilateral carotid atherosclerotic vascular disease again  noted. No flow-limiting stenosis. No significant change prior exam.  2. Vertebrals patent with antegrade flow.  2D echo 05/11/12 - EF 60-65%  Component     Latest Ref Rng 05/20/2013 08/21/2013 12/30/2013 04/02/2014  Cholesterol     0 - 200 mg/dL 150  154   Triglycerides     0.0 - 149.0 mg/dL 265.0 (H)  324.0 (H)   HDL     >39.00 mg/dL 37.30 (L)  36.50 (L)   VLDL     0.0 - 40.0 mg/dL 53.0 (H)  64.8 (H)   LDL (calc)     0 - 99 mg/dL   53   Total CHOL/HDL Ratio      4  4   NonHDL        117.50   Hemoglobin A1C     4.6 - 6.5 % 14.3 (H) 12.6 (H) 14.6 (H) 14.3 (H)   Component     Latest Ref Rng 10/13/2014  LDL (calc)  83  Hemoglobin A1C     4.0 - 6.0 % 15.0 (A)    Assessment: As you may recall, he is a 54 y.o. Caucasian male with PMH of hypertension, hyperlipidemia, uncontrolled diabetes and chronic kidney disease followed up in clinic. From his description of the spells, almost all related to positioning changes from sitting to standing, with prodromal symptoms of syncope and presyncope. Symptoms relieved when sitting down. Orthostatic vitals also trending down when stand up. This spells are most consistent with syncope and presyncope, most likely due to orthostatic hypotension. In the clinic, we checked orthostatic vitals, there is a trending for orthostatic hypotension. He had uncontrolled diabetes, his A1c last checked was 12.6 indicating poorly controlled DM. He had diabetic neuropathy with autonomic dysfunction, which explains his orthostatic hypotension as well as impotance, and finger and toe numbness / tingling. He was given midodrine and feels much better with less syncope episodes. However, he still takes midodrine and amlodipine together. Currently he is on midodrine 5mg  tid per chart but he said he takes  bid. His orthostatic vitals are good today, no orthostatic hypotension.  He still has multiple stroke risk factors including hypertension, hyperlipidemia, diabetes, stroke workup showed negative CUS. EEG negative. Latest A1C still at 15 and LDL under control at 83.  Plan:  - continue ASA and lipitor for stroke prevention  - follow up with Dr. Dwyane Dee for DM and may consider insulin pump - Follow up with your primary care physician for stroke risk factor modification. Recommend maintain blood pressure goal <130/80, diabetes with hemoglobin A1c goal below 7.0% and lipids with LDL cholesterol goal below 100 mg/dL.  - continue to follow up with PCP for orthostatic hypotension. May consider droxidopa if needed. - check BP and glucose at home  - RTC in 6 months.  No orders of the defined types were placed in this encounter.    No orders of the defined types were placed in this encounter.    Patient Instructions  - continue ASA and lipitor for stroke prevention  - follow up with Dr. Dwyane Dee for DM and may consider insulin pump - Follow up with your primary care physician for stroke risk factor modification. Recommend maintain blood pressure goal <130/80, diabetes with hemoglobin A1c goal below 7.0% and lipids with LDL cholesterol goal below 100 mg/dL.  - continue to follow up with PCP for orthostatic hypotension. May consider droxidopa if needed. - check BP and glucose at home  - follow up in 6 months.   Rosalin Hawking, MD PhD [facility] [address], [place], [place] [address] [phone]

## 2014-11-27 NOTE — Patient Instructions (Addendum)
-   continue ASA and lipitor for stroke prevention  - follow up with Dr. Dwyane Dee for DM and may consider insulin pump - Follow up with your primary care physician for stroke risk factor modification. Recommend maintain blood pressure goal <130/80, diabetes with hemoglobin A1c goal below 7.0% and lipids with LDL cholesterol goal below 100 mg/dL.  - continue to follow up with PCP for orthostatic hypotension. May consider droxidopa if needed. - check BP and glucose at home  - follow up in 6 months.

## 2014-12-16 ENCOUNTER — Other Ambulatory Visit: Payer: Self-pay | Admitting: Endocrinology

## 2014-12-18 ENCOUNTER — Ambulatory Visit (INDEPENDENT_AMBULATORY_CARE_PROVIDER_SITE_OTHER): Payer: Medicaid Other | Admitting: Endocrinology

## 2014-12-18 ENCOUNTER — Encounter: Payer: Self-pay | Admitting: Endocrinology

## 2014-12-18 VITALS — BP 160/86 | HR 87 | Temp 98.5°F | Resp 16 | Ht 69.0 in | Wt 299.4 lb

## 2014-12-18 DIAGNOSIS — E1165 Type 2 diabetes mellitus with hyperglycemia: Secondary | ICD-10-CM

## 2014-12-18 DIAGNOSIS — N183 Chronic kidney disease, stage 3 unspecified: Secondary | ICD-10-CM

## 2014-12-18 DIAGNOSIS — I10 Essential (primary) hypertension: Secondary | ICD-10-CM

## 2014-12-18 DIAGNOSIS — IMO0002 Reserved for concepts with insufficient information to code with codable children: Secondary | ICD-10-CM

## 2014-12-18 NOTE — Progress Notes (Signed)
Patient ID: Ethan Campbell, male   DOB: 1961/02/06, 54 y.o.   MRN: [id number]   Reason for Appointment: Diabetes follow-up   History of Present Illness   Diagnosis: Type 2 DIABETES MELITUS, date of diagnosis: 2000    Previous history: he has been on insulin for several years with consistently poor control Has been requiring large doses of insulin for his diabetes but A1c has been persistently high Blood sugars did not improve significantly even with trying Byetta and Victoza In 2014 he was switched from NovoLog to U-500 insulin but not clear if he has had improvement in control except with fasting readings His prior A1c was 14.0 in 5/14 and he had educational discussions with diabetes educator and dietitian in 6/14   Recent history:  Insulin regimen: U-500 insulin: 18 units for breakfast if eating, 18 before lunch, 25 before supper, 30 minutes a.c.  Lantus insulin:  70 a.m.-- 70 p.m.    He has not been seen in follow-up since 03/2014 At that time he was recommended an insulin pump but he did not follow-up on this Also he was recommended Invokana since renal function was improved but he has not taken this His blood sugars are still very difficult to control  He thinks his A1c from PCP will 15%  Also has not been able to get his Victoza from the drug store for 2 months.  He had previously been able to get this without any problems with his insurance but pharmacy told him it was not covered.  He thinks his blood sugars are higher with stopping this  Not clear again and this is a Medicaid issue He is otherwise compliant with his Lantus and U-500 insulin  Blood sugar patterns and problems identified:  He is having better blood sugars on average, previously 277 and on this download 218  Blood sugars are still fluctuating significantly at all times  FASTING blood sugars have been mostly in a good range recently but still has occasional significantly high readings at times as  also hypoglycemia  He appears to be benefiting from taking a second dose of U-500 insulin at bedtime and has taken as much as 18 units  He has had occasional hypoglycemia early morning with some readings in the 50s waking up but none for the last 12 days  MEALTIME insulin: He is skipping the dose of before meal if the blood sugar is 100 causing significant high reading subsequently  He does not always adjust the dose based on what he is eating  Most of his high postprandial readings may be related to high fat meals such as hot dogs and [place] food.  He has not changed his diet much even with talking to the nurse educator last month  Recently his readings before and after supper are significantly high; despite having the fairly good readings in the evenings about 2-3 weeks ago      Oral hypoglycemic drugs: none  (renal dysfunction)     Side effects from medications: None  Proper timing of medications in relation to meals: Yes.    Meals:  2 meals per day.eating generally at  1 pm and 6-8 pm. Occasionally high fat meals Last dietitian visit: 6/14 and last CDE visit in 4/16       Monitors blood glucose:  0.8 times a day.    Glucometer:  Accu-Chek     Blood Glucose readings from meter download (not clear which are postprandial):  PRE-MEAL Breakfast [employer]  overnight  Overall  Glucose range:  53-390    121-385   47-387    Mean/median:  170    208   230  218   POST-MEAL PC Breakfast PC Lunch PC Dinner  Glucose range:   68-436   55-471   Mean/median:      Mean values apply above for all meters except median for One Touch      Physical activity: exercise: some yardwork or other activities outside, not strenuous  Wt Readings from Last 3 Encounters:  12/18/14 299 lb 6.4 oz (135.807 kg)  11/27/14 284 lb (128.822 kg)  11/06/14 279 lb 4 oz (126.667 kg)    LABS:   Lab Results  Component Value Date   HGBA1C 15.0* 10/13/2014   HGBA1C 14.3* 04/02/2014   HGBA1C 14.6* 12/30/2013    Lab Results  Component Value Date   MICROALBUR 7.0* 12/30/2013   LDLCALC 83 10/13/2014   CREATININE 2.0* 10/13/2014    No visits with results within 1 Week(s) from this visit. Latest known visit with results is:  Abstract on 11/10/2014  Component Date Value Ref Range Status  . Creatinine 10/13/2014 2.0* .6 - 1.3 mg/dL Final  . LDL Cholesterol 10/13/2014 83   Final  . Hgb A1c MFr Bld 10/13/2014 15.0* 4.0 - 6.0 % Final      Medication List       This list is accurate as of: 12/18/14  4:00 PM.  Always use your most recent med list.               ACCU-CHEK AVIVA PLUS test strip  Generic drug:  glucose blood  TEST BLOOD SUGAR 4 TIMES DAILY.     amLODipine 10 MG tablet  Commonly known as:  NORVASC  Take 10 mg by mouth daily.     aspirin 81 MG EC tablet  Take 81 mg by mouth daily.     atorvastatin 80 MG tablet  Commonly known as:  LIPITOR  TAKE 1 TABLET DAILY.     DULoxetine 60 MG capsule  Commonly known as:  CYMBALTA  Take 60 mg by mouth daily.     fenofibrate 145 MG tablet  Commonly known as:  TRICOR  Take 145 mg by mouth daily.     FLUoxetine 20 MG capsule  Commonly known as:  PROZAC  Take 20 mg by mouth daily.     gabapentin 300 MG capsule  Commonly known as:  NEURONTIN  Take 300 mg by mouth 3 (three) times daily.     HUMULIN R 500 UNIT/ML Soln injection  Generic drug:  insulin regular human CONCENTRATED  AS DIRECTED UP TO 35 UNITS 3 TIMES DAILY.     HUMULIN R U-500 (CONCENTRATED) [place]  Inject into the skin. 15 breakfast 18 and lunch and 20 at supper and 12 at bedtime     HYDROcodone-acetaminophen 7.5-325 MG per tablet  Commonly known as:  NORCO  Take 1 tablet by mouth every 6 (six) hours as needed for moderate pain.     LANTUS OPTICLIK 100 UNIT/ML cartridge  Generic drug:  Insulin Glargine  Inject 60 Units into the skin 2 (two) times daily.     Liraglutide 18 MG/3ML Sopn  Commonly known as:  VICTOZA  INJECT 1.8 MG SUBCUTANEOUSLY ONCE DAILY.      LITE TOUCH INS SYR .5CC/29G 29G X 1/2" 0.5 ML Misc  Generic drug:  INSULIN SYRINGE .5CC/29G  USE AS DIRECTED TWICE DAILY.     midodrine 5 MG  tablet  Commonly known as:  PROAMATINE  Take 5 mg by mouth 3 (three) times daily with meals.     NON FORMULARY  C-PAP Machine. Use as directed at bedtime.     potassium chloride 10 MEQ tablet  Commonly known as:  K-DUR  TAKE 2 TABLETS BY MOUTH ONCE DAILY.     Vitamin D (Ergocalciferol) 50000 UNITS Caps capsule  Commonly known as:  DRISDOL  TAKE 1 CAPSULE TWICE A WEEK        Allergies: No Known Allergies  Past Medical History  Diagnosis Date  . Sleep apnea     Dr. Brandon Melnick  . Edema     Venous insufficiency  . Proteinuria   . Obesity   . Diabetes mellitus   . Hypertension     Benign  . Precordial pain 12/23/09    Nuclear stress; no ischemia; EF 60%  . Hyperlipidemia   . History of echocardiogram 12/09    EF 60-65%; mild increased RV; 38 mmHg  . Syncope and collapse   . Dizziness   . Numbness     hands, feet, legs  . Insomnia     Past Surgical History  Procedure Laterality Date  . 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 Total knee arthroplasty Left 2013    Family History  Problem Relation Age of Onset  . Heart attack Mother 51  . Cancer Mother     Breast  . Heart attack Father 38  . Cancer Sister     Breast  . Cancer Sister     Colon    Social History:  reports that he has quit smoking. He has never used smokeless tobacco. He reports that he does not drink alcohol or use illicit drugs.  Review of Systems:  Blackout episodes with leg weakness. This occurs for only a minute or so and during this time he cannot hear or talk but his vision is fairly good; it is followed by headache. BP and glucose during the episode have been checked by his wife and are normal.  Neurologist feels that he may have low blood pressure  Hypertension: currently on atenolol, HCTZ, Lasix and  blood pressure is low normal today No history of edema   Lipids: triglycerides are  relatively high despite taking fenofibrate. LDL is below 100, taking pravastatin  Lab Results  Component Value Date   CHOL 154 12/30/2013   HDL 36.50* 12/30/2013   LDLCALC 83 10/13/2014   LDLDIRECT 77.9 05/20/2013   TRIG 324.0* 12/30/2013   CHOLHDL 4 12/30/2013      CKD: His creatinine has ranged from 1.4-2.1 in the last 2 years , etiology unclear  Lab Results  Component Value Date   CREATININE 2.0* 10/13/2014    Examination:   BP 160/86 mmHg  Pulse 87  Temp(Src) 98.5 F (36.9 C)  Resp 16  Ht 5\' 9"  (1.753 m)  Wt 299 lb 6.4 oz (135.807 kg)  BMI 44.19 kg/m2  SpO2 97%  Body mass index is 44.19 kg/(m^2).    ASSESSMENT/ PLAN:   Diabetes type 2  See history of present illness for detailed analysis of his blood sugar patterns and day-to-day management  Blood glucose control is somewhat better with his splitting his U-500 insulin to do injections before supper and at bedtime which has helped his fasting readings As discussed above he did have some hypoglycemia until about 2 weeks ago possibly with improved diet  and evenings However not clear why his blood sugars are much higher in the afternoons and evenings lately He is still compliant with his Lantus insulin twice a day and takes his U-500 insulin about 30 minute before meals Does have significantly high readings if he has higher fat meals and he has not improved his diet even with talking the nurse educator Also skipping his insulin for meals of the blood sugar is low normal causing subsequent high readings  Recommendations made today:  Reduce evening Lantus and increase the morning dose for better effect during the day and decrease tendency to hypoglycemia overnight  Increase the first dose of U-500 insulin by 4 units to 22  Adjust mealtime doses based on what he is eating and blood sugar level before eating  Did not skip insulin at  any time with meals or at bedtime but may reduce the dose if concerned about hypoglycemia  Low fat meals  Will try to get Victoza authorized again   Change Lantus to [place] when current supply is finished  If he is able to be compliant with diet and monitoring will consider insulin pump again  Insulin doses reviewed with patient   Patient Instructions  Check blood sugars on waking up ..  .. times a week Also check blood sugars about 2 hours after a meal and do this after different meals by rotation  Recommended blood sugar levels on waking up is 90-130 and about 2 hours after meal is 140-180 Please bring blood sugar monitor to each visit.  Do not skip insulin at any meal, may reduce if sugar low  LANTUS 80 IN AM AND 60 IN PM  U-500 insulin take 22 units in am and 12 at supper with 12 at bedtime and adjust based on diet and sugar level (no more than 16 at bedtime)   Counseling time on subjects discussed above is over 50% of today's 25 minute visit    Marcel Gary 12/18/2014, 4:00 PM

## 2014-12-18 NOTE — Patient Instructions (Signed)
Check blood sugars on waking up ..  .. times a week Also check blood sugars about 2 hours after a meal and do this after different meals by rotation  Recommended blood sugar levels on waking up is 90-130 and about 2 hours after meal is 140-180 Please bring blood sugar monitor to each visit.  Do not skip insulin at any meal, may reduce if sugar low  LANTUS 80 IN AM AND 60 IN PM  U-500 insulin take 22 units in am and 12 at supper with 12 at bedtime and adjust based on diet and sugar level (no more than 16 at bedtime)

## 2015-01-05 ENCOUNTER — Other Ambulatory Visit: Payer: Self-pay | Admitting: Endocrinology

## 2015-01-27 ENCOUNTER — Other Ambulatory Visit: Payer: Self-pay | Admitting: Endocrinology

## 2015-02-18 ENCOUNTER — Encounter: Payer: Self-pay | Admitting: Endocrinology

## 2015-02-18 ENCOUNTER — Ambulatory Visit (INDEPENDENT_AMBULATORY_CARE_PROVIDER_SITE_OTHER): Payer: Medicaid Other | Admitting: Endocrinology

## 2015-02-18 ENCOUNTER — Other Ambulatory Visit: Payer: Self-pay | Admitting: *Deleted

## 2015-02-18 VITALS — BP 122/76 | HR 75 | Temp 98.1°F | Resp 16 | Ht 69.0 in | Wt 305.8 lb

## 2015-02-18 DIAGNOSIS — E1165 Type 2 diabetes mellitus with hyperglycemia: Secondary | ICD-10-CM

## 2015-02-18 DIAGNOSIS — IMO0002 Reserved for concepts with insufficient information to code with codable children: Secondary | ICD-10-CM

## 2015-02-18 LAB — POCT GLYCOSYLATED HEMOGLOBIN (HGB A1C): Hemoglobin A1C: 12

## 2015-02-18 MED ORDER — LIRAGLUTIDE 18 MG/3ML ~~LOC~~ SOPN: PEN_INJECTOR | SUBCUTANEOUS | Status: DC

## 2015-02-18 NOTE — Patient Instructions (Signed)
LANTUS 70 IN AM AND 70 IN PM  U-500 insulin take 22 units with first meal at lunch  and 12 at supper with 14 at bedtime   If eating breakfast 8 units of R insulin  If early am sugar stays high go up to 16-18 on bedtime dose

## 2015-02-18 NOTE — Progress Notes (Signed)
Patient ID: Ethan Campbell, male   DOB: 1961/01/08, 54 y.o.   MRN: [id number]   Reason for Appointment: Diabetes follow-up   History of Present Illness   Diagnosis: Type 2 DIABETES MELITUS, date of diagnosis: 2000    Previous history: he has been on insulin for several years with consistently poor control Has been requiring large doses of insulin for his diabetes but A1c has been persistently high Blood sugars did not improve significantly even with trying Byetta and Victoza In 2014 he was switched from NovoLog to U-500 insulin but not clear if he has had improvement in control except with fasting readings His prior A1c was 14.0 in 5/14 and he had educational discussions with diabetes educator and dietitian in 6/14   Recent history:  Insulin regimen:  LANTUS 60 IN AM AND 60 IN PM  U-500 insulin take 22 units in am and 12 at supper with 12 at bedtime   He was told to increase his morning Lantus on the last visit and also the morning Humulin R but he did not do so Still taking somewhat arbitrary insulin doses His blood sugars are still very difficult to control   Blood sugar patterns and problems identified:  He is having markedly increased fasting blood sugars lately and not clear why.  Has only a couple of good readings, once after doing a correction during the night   His blood sugars are higher on average, previously 218 and now 314  Blood sugars are still fluctuating and he is checking blood sugars mostly on waking up and midday but not after meals usually  His compliance with INSULIN is inconsistent now and he says that because he is working during the day he will not take his insulin for his midday meal frequently  Occasionally will have breakfast which is a small meal but will not take any insulin at this time  Only once has had a low normal blood sugar before supper  He has gained some weight despite higher blood sugars and probably not watching his diet  consistently     His C-peptide level was 0.9 in 2015 but he did not pursue the insulin pump as directed  Oral hypoglycemic drugs: none  (renal dysfunction)     Side effects from medications: None  Proper timing of medications in relation to meals: Yes.    Meals:  2 meals per day.eating generally at  1 pm and 6-8 pm. Occasionally high fat meals Last dietitian visit: 6/14 and last CDE visit in 4/16       Monitors blood glucose:  0.8 times a day.    Glucometer:  Accu-Chek     Blood Glucose readings from meter download (not clear which are postprandial):  Mean values apply above for all meters except median for One Touch  PRE-MEAL Fasting  2-4 PM  6-7 PM  Bedtime Overall  Glucose range:  81-549   133-600   66, 92, 436   128    Mean/median:  361   275     314     Physical activity: exercise: some yardwork or other activities, managing fruit stand  Wt Readings from Last 3 Encounters:  02/18/15 305 lb 12.8 oz (138.71 kg)  12/18/14 299 lb 6.4 oz (135.807 kg)  11/27/14 284 lb (128.822 kg)    LABS:   Lab Results  Component Value Date   HGBA1C 12 02/18/2015   HGBA1C 15.0* 10/13/2014   HGBA1C 14.3* 04/02/2014   Lab Results  Component Value Date   MICROALBUR 7.0* 12/30/2013   LDLCALC 83 10/13/2014   CREATININE 2.0* 10/13/2014    Office Visit on 02/18/2015  Component Date Value Ref Range Status  . Hemoglobin A1C 02/18/2015 12   Final      Medication List       This list is accurate as of: 02/18/15 11:59 PM.  Always use your most recent med list.               ACCU-CHEK AVIVA PLUS test strip  Generic drug:  glucose blood  TEST UP TO 4 TIMES A DAY.     amLODipine 10 MG tablet  Commonly known as:  NORVASC  Take 10 mg by mouth daily.     aspirin 81 MG EC tablet  Take 81 mg by mouth daily.     atorvastatin 80 MG tablet  Commonly known as:  LIPITOR  TAKE 1 TABLET DAILY.     BENAZEPRIL HCL PO  Take 20 mg by mouth.     DULoxetine 60 MG capsule  Commonly known as:   CYMBALTA  Take 60 mg by mouth daily.     fenofibrate 145 MG tablet  Commonly known as:  TRICOR  Take 145 mg by mouth daily.     FLUoxetine 20 MG capsule  Commonly known as:  PROZAC  Take 20 mg by mouth daily.     gabapentin 300 MG capsule  Commonly known as:  NEURONTIN  Take 300 mg by mouth 3 (three) times daily.     HUMULIN R 500 UNIT/ML injection  Generic drug:  insulin regular human CONCENTRATED  AS DIRECTED UP TO 35 UNITS 3 TIMES DAILY.     HYDROcodone-acetaminophen 7.5-325 MG per tablet  Commonly known as:  NORCO  Take 1 tablet by mouth every 6 (six) hours as needed for moderate pain.     LANTUS OPTICLIK 100 UNIT/ML cartridge  Generic drug:  Insulin Glargine  Inject 60 Units into the skin 2 (two) times daily.     Liraglutide 18 MG/3ML Sopn  Commonly known as:  VICTOZA  INJECT 1.8 MG SUBCUTANEOUSLY ONCE DAILY.     LITE TOUCH INS SYR .5CC/29G 29G X 1/2" 0.5 ML Misc  Generic drug:  INSULIN SYRINGE .5CC/29G  USE AS DIRECTED TWICE DAILY.     midodrine 5 MG tablet  Commonly known as:  PROAMATINE  Take 5 mg by mouth 3 (three) times daily with meals.     NON FORMULARY  C-PAP Machine. Use as directed at bedtime.     potassium chloride 10 MEQ tablet  Commonly known as:  K-DUR  TAKE 2 TABLETS BY MOUTH ONCE DAILY.     Vitamin D (Ergocalciferol) 50000 UNITS Caps capsule  Commonly known as:  DRISDOL  TAKE 1 CAPSULE TWICE A WEEK        Allergies: No Known Allergies  Past Medical History  Diagnosis Date  . Sleep apnea     Dr. Brandon Melnick  . Edema     Venous insufficiency  . Proteinuria   . Obesity   . Diabetes mellitus   . Hypertension     Benign  . Precordial pain 12/23/09    Nuclear stress; no ischemia; EF 60%  . Hyperlipidemia   . History of echocardiogram 12/09    EF 60-65%; mild increased RV; 38 mmHg  . Syncope and collapse   . Dizziness   . Numbness     hands, feet, legs  . Insomnia     Past  Surgical History  Procedure Laterality Date  . 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 Total knee arthroplasty Left 2013    Family History  Problem Relation Age of Onset  . Heart attack Mother 60  . Cancer Mother     Breast  . Heart attack Father 46  . Cancer Sister     Breast  . Cancer Sister     Colon    Social History:  reports that he has quit smoking. He has never used smokeless tobacco. He reports that he does not drink alcohol or use illicit drugs.  Review of Systems:  Blackout episodes with leg weakness. This occurs for only a minute or so and during this time he cannot hear or talk but his vision is fairly good; it is followed by headache. BP and glucose during the episode have been checked by his wife and are normal.  Still having some episodes  Hypertension: currently on atenolol, HCTZ, Lasix and blood pressure is  normal today No history of edema   Lipids: triglycerides are  relatively high despite taking fenofibrate. LDL is below 100, taking Lipitor currently  Lab Results  Component Value Date   CHOL 154 12/30/2013   HDL 36.50* 12/30/2013   LDLCALC 83 10/13/2014   LDLDIRECT 77.9 05/20/2013   TRIG 324.0* 12/30/2013   CHOLHDL 4 12/30/2013      CKD: His creatinine has ranged from 1.4-2.1 in the last 2 years , etiology unclear  Lab Results  Component Value Date   CREATININE 2.0* 10/13/2014    Examination:   BP 122/76 mmHg  Pulse 75  Temp(Src) 98.1 F (36.7 C)  Resp 16  Ht 5\' 9"  (1.753 m)  Wt 305 lb 12.8 oz (138.71 kg)  BMI 45.14 kg/m2  SpO2 95%  Body mass index is 45.14 kg/(m^2).    ASSESSMENT/ PLAN:   Diabetes type 2  See history of present illness for detailed analysis of his blood sugar patterns and day-to-day management  Blood glucose control is recently poorly controlled for various reasons as discussed above He is not taking inadequate insulin and did not increase the doses as directed on his last visit Most of his noncompliance is related to not  taking his mealtime dose with his midday meal Also not clear why he is having much higher fasting readings most of the time Also has gained weight  Previously had done better with adding a dose of U-500 insulin at bedtime also and overall his A1c is somewhat better than his usual level of 14-15  Recommendations made today:  Increase Lantus by 10 units twice a day  Increase Humulin R at lunch meal and take a small dose at breakfast if eating in the morning  More readings after meals especially at night  Low-fat diet  If he is able to be compliant with diet and monitoring will consider insulin pump again, unable to do C-peptide today as glucoses over 200  HYPERLIPIDEMIA: We will need follow-up levels  RENAL dysfunction: Labs requested from PCP  Patient Instructions  LANTUS 70 IN AM AND 70 IN PM  U-500 insulin take 22 units with first meal at lunch  and 12 at supper with 14 at bedtime   If eating breakfast 8 units of R insulin  If early am sugar stays high go up to 16-18 on bedtime dose   Counseling time on subjects discussed above is over 50% of today's  25 minute visit    Tell Rozelle 02/19/2015, 12:20 PM

## 2015-03-24 ENCOUNTER — Emergency Department (HOSPITAL_COMMUNITY): Payer: Medicaid Other

## 2015-03-24 ENCOUNTER — Encounter (HOSPITAL_COMMUNITY): Payer: Self-pay

## 2015-03-24 ENCOUNTER — Emergency Department (HOSPITAL_COMMUNITY)
Admission: EM | Admit: 2015-03-24 | Discharge: 2015-03-24 | Disposition: A | Discharge status: Home / Self Care | Payer: Medicaid Other | Attending: Emergency Medicine | Admitting: Emergency Medicine

## 2015-03-24 DIAGNOSIS — R55 Syncope and collapse: Secondary | ICD-10-CM | POA: Diagnosis present

## 2015-03-24 DIAGNOSIS — Z7982 Long term (current) use of aspirin: Secondary | ICD-10-CM | POA: Insufficient documentation

## 2015-03-24 DIAGNOSIS — E785 Hyperlipidemia, unspecified: Secondary | ICD-10-CM | POA: Insufficient documentation

## 2015-03-24 DIAGNOSIS — E669 Obesity, unspecified: Secondary | ICD-10-CM | POA: Insufficient documentation

## 2015-03-24 DIAGNOSIS — E119 Type 2 diabetes mellitus without complications: Secondary | ICD-10-CM | POA: Diagnosis not present

## 2015-03-24 DIAGNOSIS — Z87891 Personal history of nicotine dependence: Secondary | ICD-10-CM | POA: Diagnosis not present

## 2015-03-24 DIAGNOSIS — G908 Other disorders of autonomic nervous system: Secondary | ICD-10-CM | POA: Diagnosis not present

## 2015-03-24 DIAGNOSIS — I10 Essential (primary) hypertension: Secondary | ICD-10-CM | POA: Insufficient documentation

## 2015-03-24 DIAGNOSIS — Z79899 Other long term (current) drug therapy: Secondary | ICD-10-CM | POA: Insufficient documentation

## 2015-03-24 DIAGNOSIS — G909 Disorder of the autonomic nervous system, unspecified: Secondary | ICD-10-CM

## 2015-03-24 DIAGNOSIS — Z794 Long term (current) use of insulin: Secondary | ICD-10-CM | POA: Insufficient documentation

## 2015-03-24 DIAGNOSIS — Z8669 Personal history of other diseases of the nervous system and sense organs: Secondary | ICD-10-CM | POA: Insufficient documentation

## 2015-03-24 LAB — CBC WITH DIFFERENTIAL/PLATELET
Basophils Absolute: 0 10*3/uL (ref 0.0–0.1)
Basophils Relative: 1 % (ref 0–1)
EOS ABS: 0.1 10*3/uL (ref 0.0–0.7)
EOS PCT: 1 % (ref 0–5)
HCT: 39 % (ref 39.0–52.0)
Hemoglobin: 13.4 g/dL (ref 13.0–17.0)
LYMPHS ABS: 2.2 10*3/uL (ref 0.7–4.0)
LYMPHS PCT: 34 % (ref 12–46)
MCH: 30.2 pg (ref 26.0–34.0)
MCHC: 34.4 g/dL (ref 30.0–36.0)
MCV: 88 fL (ref 78.0–100.0)
MONO ABS: 0.5 10*3/uL (ref 0.1–1.0)
Monocytes Relative: 8 % (ref 3–12)
Neutro Abs: 3.8 10*3/uL (ref 1.7–7.7)
Neutrophils Relative %: 56 % (ref 43–77)
PLATELETS: 315 10*3/uL (ref 150–400)
RBC: 4.43 MIL/uL (ref 4.22–5.81)
RDW: 12.5 % (ref 11.5–15.5)
WBC: 6.6 10*3/uL (ref 4.0–10.5)

## 2015-03-24 LAB — URINALYSIS, ROUTINE W REFLEX MICROSCOPIC
Bilirubin Urine: NEGATIVE
Hgb urine dipstick: NEGATIVE
Ketones, ur: NEGATIVE mg/dL
Leukocytes, UA: NEGATIVE
Nitrite: NEGATIVE
PH: 5 (ref 5.0–8.0)
PROTEIN: NEGATIVE mg/dL
SPECIFIC GRAVITY, URINE: 1.02 (ref 1.005–1.030)
Urobilinogen, UA: 0.2 mg/dL (ref 0.0–1.0)

## 2015-03-24 LAB — BASIC METABOLIC PANEL
Anion gap: 8 (ref 5–15)
BUN: 49 mg/dL — AB (ref 6–20)
CALCIUM: 9.5 mg/dL (ref 8.9–10.3)
CO2: 28 mmol/L (ref 22–32)
CREATININE: 2.83 mg/dL — AB (ref 0.61–1.24)
Chloride: 100 mmol/L — ABNORMAL LOW (ref 101–111)
GFR calc Af Amer: 28 mL/min — ABNORMAL LOW (ref >60)
GFR, EST NON AFRICAN AMERICAN: 24 mL/min — AB (ref >60)
GLUCOSE: 140 mg/dL — AB (ref 65–99)
POTASSIUM: 3.6 mmol/L (ref 3.5–5.1)
SODIUM: 136 mmol/L (ref 135–145)

## 2015-03-24 LAB — URINE MICROSCOPIC-ADD ON

## 2015-03-24 LAB — TROPONIN I

## 2015-03-24 MED ORDER — SODIUM CHLORIDE 0.9 % IV BOLUS (SEPSIS)
1000.0000 mL | Freq: Once | INTRAVENOUS | Status: AC
  Administered 2015-03-24: 1000 mL via INTRAVENOUS

## 2015-03-24 NOTE — ED Notes (Signed)
EMS reports pt's wife reported he was at home sitting in chair.  Reports she was calling her name but pt would not answer.  Pt says he remembers hearing her call him but he couldn't answer her.  CBG was 312 per EMS.  Reports episode happened around 1320.  EMS reports when they arrived, pt was pale and bp was 84/56 sitting, standing 71/64, lying 103/68.  EMS reports pt's pulse was weak and irregular.  Says was barely palpable initially.  EMS says pt "converted" himself to [place] with occasional PVC's.  EMS administered 2liters 02 via [place].   PT reports has had these episodes in the past.  Pt says he has these "episodes" everyday but usually not this bad.  Pt says usually he feels weak then starts to have a headache and gets cramps all over.

## 2015-03-24 NOTE — ED Notes (Signed)
Patient with no complaints at this time. Respirations even and unlabored. Skin warm/dry. Discharge instructions reviewed with patient at this time. Patient given opportunity to voice concerns/ask questions. IV removed per policy and band-aid applied to site. Patient discharged at this time and left Emergency Department with steady gait.  

## 2015-03-24 NOTE — ED Provider Notes (Signed)
CSN: [id number]     Arrival date & time 03/24/15  1451 History   First MD Initiated Contact with Patient 03/24/15 1500     Chief Complaint  Patient presents with  . Loss of Consciousness     (Consider location/radiation/quality/duration/timing/severity/associated sxs/prior Treatment) HPI   Ethan Campbell is a 54 y.o. male who presents for evaluation of a period of decreased responsiveness. He was sitting in a chair when his wife noticed that he was not responsive. He recalls being able to hear call to him, but was unable to respond verbally. She called EMS who arrived and found him with CBG of 312, and blood pressure 84/56. Blood pressure dropped to 71/64 standing. His wife thought his pulse was "weak". Patient became responsive after receiving IV fluids. He received 2 L by EMS. He usually gets carried needed, [place], but family members decided to have him brought here for a "second opinion". Patient denies chest pain, headache, weakness or dizziness, while lying supine. No recent illnesses, including cough, fever, chills, vomiting, dysuria, or change in bowel habits. He has frequent episodes of decreased responsiveness, and weakness, almost daily. His doctor has evaluated him for this, and prescribed Midodrine, for autonomic instability. He tries to drink 2-3 L of purulent day. He is taking his usual medication. There are no other known modifying factors.    Past Medical History  Diagnosis Date  . Sleep apnea     Dr. Brandon Melnick  . Edema     Venous insufficiency  . Proteinuria   . Obesity   . Diabetes mellitus   . Hypertension     Benign  . Precordial pain 12/23/09    Nuclear stress; no ischemia; EF 60%  . Hyperlipidemia   . History of echocardiogram 12/09    EF 60-65%; mild increased RV; 38 mmHg  . Syncope and collapse   . Dizziness   . Numbness     hands, feet, legs  . Insomnia    Past Surgical History  Procedure Laterality Date  . 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 Total knee arthroplasty Left 2013   Family History  Problem Relation Age of Onset  . Heart attack Mother 44  . Cancer Mother     Breast  . Heart attack Father 48  . Cancer Sister     Breast  . Cancer Sister     Colon   Social History  Substance Use Topics  . Smoking status: Former Research scientist (life sciences)  . Smokeless tobacco: Never Used     Comment: 11/27/14 "quit smoking years ago"  . Alcohol Use: No    Review of Systems  All other systems reviewed and are negative.     Allergies  Review of patient's allergies indicates no known allergies.  Home Medications   Prior to Admission medications   Medication Sig Start Date End Date Taking? Authorizing Provider  ACCU-CHEK AVIVA PLUS test strip TEST UP TO 4 TIMES A DAY. 01/05/15  Yes Elayne Snare, MD  aspirin 81 MG EC tablet Take 81 mg by mouth daily.     Yes Historical Provider, MD  atorvastatin (LIPITOR) 80 MG tablet TAKE 1 TABLET DAILY. 04/29/14  Yes Elayne Snare, MD  benazepril (LOTENSIN) 20 MG tablet Take 20 mg by mouth daily.   Yes Historical Provider, MD  DULoxetine (CYMBALTA) 60 MG capsule Take 60 mg by mouth 2 (two) times daily.    Yes Historical Provider, MD  fenofibrate (TRICOR) 145 MG tablet Take 145 mg by mouth daily.     Yes Historical Provider, MD  FLUoxetine (PROZAC) 20 MG capsule Take 20 mg by mouth daily.   Yes Historical Provider, MD  furosemide (LASIX) 80 MG tablet Take 40-80 mg by mouth 2 (two) times daily. Takes one tablet in the morning and one-half tablet in the evening   Yes Historical Provider, MD  gabapentin (NEURONTIN) 300 MG capsule Take 300 mg by mouth 3 (three) times daily.   Yes Historical Provider, MD  HUMULIN R 500 UNIT/ML SOLN injection AS DIRECTED UP TO 35 UNITS 3 TIMES DAILY. Patient taking differently: TAKE AS DIRECTED BASED ON BLOOD SUGAR LEVELS. TAKES 15 UNITS IN THE MORNING, 18 UNITS IN THE AFTERNOON, 20 UNITS IN THE EVENING, AND 12 UNITS AT BEDTIME 09/11/14  Yes Elayne Snare, MD  HYDROcodone-acetaminophen (NORCO) 10-325 MG per tablet Take 1 tablet by mouth every 6 (six) hours as needed for moderate pain or severe pain.   Yes Historical Provider, MD  insulin glargine (LANTUS) 100 UNIT/ML injection Inject 70 Units into the skin 2 (two) times daily.   Yes Historical Provider, MD  Liraglutide (VICTOZA) 18 MG/3ML SOPN INJECT 1.8 MG SUBCUTANEOUSLY ONCE DAILY. Patient taking differently: Inject into the skin daily.  02/18/15  Yes Elayne Snare, MD  LITE TOUCH INS SYR .5CC/29G 29G X 1/2" 0.5 ML MISC USE AS DIRECTED TWICE DAILY. 04/08/14  Yes Elayne Snare, MD  midodrine (PROAMATINE) 5 MG tablet Take 5 mg by mouth 2 (two) times daily with a meal.    Yes Historical Provider, MD  Omega-3 Fatty Acids (FISH OIL) 1200 MG CAPS Take 1 capsule by mouth daily.   Yes Historical Provider, MD  potassium chloride (K-DUR) 10 MEQ tablet TAKE 2 TABLETS BY MOUTH ONCE DAILY. 11/17/14  Yes Elayne Snare, MD  tiZANidine (ZANAFLEX) 4 MG tablet Take 4 mg by mouth 2 (two) times daily.   Yes Historical Provider, MD  Vitamin D, Ergocalciferol, (DRISDOL) 50000 UNITS CAPS capsule TAKE 1 CAPSULE TWICE A WEEK Patient taking differently: TAKE 1 CAPSULE TWICE A WEEK ON SUNDAYS AND TUESDAYS 01/27/15  Yes Elayne Snare, MD   BP 101/57 mmHg  Pulse 69  Temp(Src) 98.1 F (36.7 C) (Oral)  Resp 12  SpO2 94% Physical Exam  Constitutional: He is oriented to person, place, and time. He appears well-developed and well-nourished. No distress.  HENT:  Head: Normocephalic and atraumatic.  Right Ear: External ear normal.  Left Ear: External ear normal.  Eyes: Conjunctivae and EOM are normal. Pupils are equal, round, and reactive to light.  Neck: Normal range of motion and phonation normal. Neck supple.  Cardiovascular: Normal rate, regular rhythm and normal heart sounds.   He is normotensive  Pulmonary/Chest: Effort normal and breath sounds normal. He exhibits no bony tenderness.  Abdominal: Soft. There is no tenderness.   Musculoskeletal: Normal range of motion. He exhibits no edema.  Neurological: He is alert and oriented to person, place, and time. No cranial nerve deficit or sensory deficit. He exhibits normal muscle tone. Coordination normal.  Skin: Skin is warm, dry and intact.  Psychiatric: He has a normal mood and affect. His behavior is normal. Judgment and thought content normal.  Nursing note and vitals reviewed.   ED Course  Procedures (including critical care time) Medications  sodium chloride 0.9 % bolus 1,000 mL (0 mLs Intravenous Stopped 03/24/15 1711)    Patient Vitals for the past 24 hrs:  BP Temp Temp src Pulse  Resp SpO2  03/24/15 1700 108/69 mmHg - - 70 15 96 %  03/24/15 1530 101/57 mmHg - - 69 12 94 %  03/24/15 1510 101/55 mmHg 98.1 F (36.7 C) Oral 72 15 99 %    5:17 PM Reevaluation with update and discussion. After initial assessment and treatment, an updated evaluation reveals he is tolerating oral liquids and food. Findings discussed with patient and family members, all questions were answered. [place] Review Labs Reviewed  BASIC METABOLIC PANEL - Abnormal; Notable for the following:    Chloride 100 (*)    Glucose, Bld 140 (*)    BUN 49 (*)    Creatinine, Ser 2.83 (*)    GFR calc non Af Amer 24 (*)    GFR calc Af Amer 28 (*)    All other components within normal limits  URINALYSIS, ROUTINE W REFLEX MICROSCOPIC (NOT AT [facility]) - Abnormal; Notable for the following:    Glucose, UA >1000 (*)    All other components within normal limits  CBC WITH DIFFERENTIAL/PLATELET  TROPONIN I  URINE MICROSCOPIC-ADD ON    Imaging Review Dg Chest Portable 1 View  03/24/2015   CLINICAL DATA:  Altered mental status. History of diabetes, hypertension and syncope.  EXAM: PORTABLE CHEST - 1 VIEW  COMPARISON:  None.  FINDINGS: 1503 hours. The heart size and mediastinal contours are normal. The lungs are clear. There is no pleural effusion or pneumothorax. No acute osseous  findings are identified. Telemetry leads overlie the chest.  IMPRESSION: No active cardiopulmonary process.   Electronically Signed   By: Richardean Sale M.D.   On: 03/24/2015 15:13   I have personally reviewed and evaluated these images and lab results as part of my medical decision-making.   EKG Interpretation None      MDM   Final diagnoses:  Autonomic instability    Mild dehydration, and elevated BUN/creatinine ratio, from baseline. He is also hyperglycemic, likely related to diabetes complicated by use of sugar containing oral fluids. He is on chronic therapy with Lasix. This is likely An exacerbating cause for his dehydration. Doubt serous bacterial infection metabolic instability or impending vascular collapse.   Nursing Notes Reviewed/ Care Coordinated Applicable Imaging Reviewed Interpretation of Laboratory Data incorporated into ED treatment  The patient appears reasonably screened and/or stabilized for discharge and I doubt any other medical condition or other [facility] requiring further screening, evaluation, or treatment in the ED at this time prior to discharge.  Plan: Home Medications- hold Lasix for 3 days then 1/2 dose for 4 days; Home Treatments- rest, increase oral fluids; return here if the recommended treatment, does not improve the symptoms; Recommended follow up- PCP check in 2-3 days.     Daleen Bo, MD 03/24/15 [id number]

## 2015-03-24 NOTE — Discharge Instructions (Signed)
Do not take Lasix, for 3 days. After that, take one half dose, for 4 days, then restart your usual dose. Try to drink more liquids for several days. Be careful about your sugar intake.   Peripheral Neuropathy Peripheral neuropathy is a type of nerve damage. It affects nerves that carry signals between the spinal cord and other parts of the body. These are called peripheral nerves. With peripheral neuropathy, one nerve or a group of nerves may be damaged.  CAUSES  Many things can damage peripheral nerves. For some people with peripheral neuropathy, the cause is unknown. Some causes include:  Diabetes. This is the most common cause of peripheral neuropathy.  Injury to a nerve.  Pressure or stress on a nerve that lasts a long time.  Too little vitamin B. Alcoholism can lead to this.  Infections.  Autoimmune diseases, such as multiple sclerosis and systemic lupus erythematosus.  Inherited nerve diseases.  Some medicines, such as cancer drugs.  Toxic substances, such as lead and mercury.  Too little blood flowing to the legs.  Kidney disease.  Thyroid disease. SIGNS AND SYMPTOMS  Different people have different symptoms. The symptoms you have will depend on which of your nerves is damaged. Common symptoms include:  Loss of feeling (numbness) in the feet and hands.  Tingling in the feet and hands.  Pain that burns.  Very sensitive skin.  Weakness.  Not being able to move a part of the body (paralysis).  Muscle twitching.  Clumsiness or poor coordination.  Loss of balance.  Not being able to control your bladder.  Feeling dizzy.  Sexual problems. DIAGNOSIS  Peripheral neuropathy is a symptom, not a disease. Finding the cause of peripheral neuropathy can be hard. To figure that out, your health care provider will take a medical history and do a physical exam. A neurological exam will also be done. This involves checking things affected by your brain, spinal cord,  and nerves (nervous system). For example, your health care provider will check your reflexes, how you move, and what you can feel.  Other types of tests may also be ordered, such as:  Blood tests.  A test of the fluid in your spinal cord.  Imaging tests, such as CT scans or an MRI.  Electromyography (EMG). This test checks the nerves that control muscles.  Nerve conduction velocity tests. These tests check how fast messages pass through your nerves.  Nerve biopsy. A small piece of nerve is removed. It is then checked under a microscope. TREATMENT   Medicine is often used to treat peripheral neuropathy. Medicines may include:  Pain-relieving medicines. Prescription or over-the-counter medicine may be suggested.  Antiseizure medicine. This may be used for pain.  Antidepressants. These also may help ease pain from neuropathy.  Lidocaine. This is a numbing medicine. You might wear a patch or be given a shot.  Mexiletine. This medicine is typically used to help control irregular heart rhythms.  Surgery. Surgery may be needed to relieve pressure on a nerve or to destroy a nerve that is causing pain.  Physical therapy to help movement.  Assistive devices to help movement. HOME CARE INSTRUCTIONS   Only take over-the-counter or prescription medicines as directed by your health care provider. Follow the instructions carefully for any given medicines. Do not take any other medicines without first getting approval from your health care provider.  If you have diabetes, work closely with your health care provider to keep your blood sugar under control.  If you  have numbness in your feet:  Check every day for signs of injury or infection. Watch for redness, warmth, and swelling.  Wear padded socks and comfortable shoes. These help protect your feet.  Do not do things that put pressure on your damaged nerve.  Do not smoke. Smoking keeps blood from getting to damaged nerves.  Avoid or  limit alcohol. Too much alcohol can cause a lack of B vitamins. These vitamins are needed for healthy nerves.  Develop a good support system. Coping with peripheral neuropathy can be stressful. Talk to a mental health specialist or join a support group if you are struggling.  Follow up with your health care provider as directed. SEEK MEDICAL CARE IF:   You have new signs or symptoms of peripheral neuropathy.  You are struggling emotionally from dealing with peripheral neuropathy.  You have a fever. SEEK IMMEDIATE MEDICAL CARE IF:   You have an injury or infection that is not healing.  You feel very dizzy or begin vomiting.  You have chest pain.  You have trouble breathing. Document Released: 07/01/2002 Document Revised: 03/23/2011 Document Reviewed: 03/18/2013 [facility] Patient Information 2015 [place], [place]. This information is not intended to replace advice given to you by your health care provider. Make sure you discuss any questions you have with your health care provider.

## 2015-04-06 ENCOUNTER — Ambulatory Visit: Payer: Medicaid Other | Admitting: Endocrinology

## 2015-04-29 ENCOUNTER — Encounter: Payer: Self-pay | Admitting: Endocrinology

## 2015-04-29 ENCOUNTER — Ambulatory Visit (INDEPENDENT_AMBULATORY_CARE_PROVIDER_SITE_OTHER): Payer: Medicaid Other | Admitting: Endocrinology

## 2015-04-29 VITALS — BP 95/60 | HR 82 | Temp 98.2°F | Resp 16 | Ht 69.0 in | Wt 309.0 lb

## 2015-04-29 DIAGNOSIS — I951 Orthostatic hypotension: Secondary | ICD-10-CM

## 2015-04-29 DIAGNOSIS — Z794 Long term (current) use of insulin: Secondary | ICD-10-CM | POA: Diagnosis not present

## 2015-04-29 DIAGNOSIS — E1165 Type 2 diabetes mellitus with hyperglycemia: Secondary | ICD-10-CM | POA: Diagnosis not present

## 2015-04-29 MED ORDER — INSULIN DEGLUDEC 200 UNIT/ML ~~LOC~~ SOPN: 160.0000 [IU] | PEN_INJECTOR | Freq: Every day | SUBCUTANEOUS | Status: DC

## 2015-04-29 NOTE — Patient Instructions (Addendum)
U-500 insulin take 18 units with first meal 18 at lunch and 24 at supper (6-7 pm) with 14 at bedtime  Tresiba 160 at dinner, replace lantus  Cut benazepril in 1/2  Check blood sugars on waking up .. 5 .. times a week Also check blood sugars about 2 hours after a meal and do this after different meals by rotation Recommended blood sugar levels on waking up is 90-130 and about 2 hours after meal is 140-180 Please bring blood sugar monitor to each visit.

## 2015-04-29 NOTE — Progress Notes (Signed)
Patient ID: Ethan Campbell, male   DOB: 1961-01-16, 54 y.o.   MRN: [id number]   Reason for Appointment: Diabetes follow-up   History of Present Illness   Diagnosis: Type 2 DIABETES MELITUS, date of diagnosis: 2000    Previous history: he has been on insulin for several years with consistently poor control Has been requiring large doses of insulin for his diabetes but A1c has been persistently high Blood sugars did not improve significantly even with trying Byetta and Victoza In 2014 he was switched from NovoLog to U-500 insulin but not clear if he has had improvement in control except with fasting readings His prior A1c was 14.0 in 5/14 and he had educational discussions with diabetes educator and dietitian in 6/14   Recent history:  Insulin regimen:   LANTUS 70 IN AM AND 70 IN PM  U-500 insulin take 15 units with first meal 18 at lunch and 20 at supper (6-7 pm) with 12 at bedtime and +2-6 units if over 200   He was told to increase his morning Lantus on the last visit Also the U-500 insulin was increased but he has not made the changes His blood sugars are still very difficult to control with blood sugars averaging 280 recently He also continues to take Victoza  Blood sugar patterns and problems identified:  He is having better fasting readings compared to the last visit but these are quite variable   His blood sugars are tending to be higher in the evenings even though he is a relatively smaller amount of food at lunchtime  He does check some readings late at night in these are quite variable but mostly high again after his evening meal  He will sometimes have high fat meals and does not adjust his insulin based on that  markedly increased fasting blood sugars lately and not clear why.  Has only a couple of good readings, once after doing a correction during the night   He has done a little better with his mealtime insulin, previously was not being regular with  this  He has gained some weight despite overall high blood sugars and probably not watching his diet consistently  He has had only one episode of hypoglycemia waking up about a month ago     His C-peptide level was 0.9 in 2015 but he did not pursue the insulin pump as directed  Oral hypoglycemic drugs: none  (renal dysfunction)     Side effects from medications: None  Proper timing of medications in relation to meals: Yes.    Meals:  2 meals per day.eating generally at  1 pm and 6-8 pm. Occasionally high fat meals Last dietitian visit: 6/14 and last CDE visit in 4/16       Monitors blood glucose:  0.8 times a day.    Glucometer:  Accu-Chek     Blood Glucose readings from meter download (not clear which are postprandial):  Mean values apply above for all meters except median for One Touch  PRE-MEAL Fasting Lunch Dinner Bedtime Overall  Glucose range:  52-309   133-404     214-586    Mean/median:  199  257   272  343  281   Physical activity: exercise: some yardwork or other activities  Wt Readings from Last 3 Encounters:  04/29/15 309 lb (140.161 kg)  02/18/15 305 lb 12.8 oz (138.71 kg)  12/18/14 299 lb 6.4 oz (135.807 kg)    LABS:   Lab Results  Component Value Date   HGBA1C 12 02/18/2015   HGBA1C 15.0* 10/13/2014   HGBA1C 14.3* 04/02/2014   Lab Results  Component Value Date   MICROALBUR 7.0* 12/30/2013   LDLCALC 83 10/13/2014   CREATININE 2.83* 03/24/2015        Medication List       This list is accurate as of: 04/29/15 11:59 PM.  Always use your most recent med list.               ACCU-CHEK AVIVA PLUS test strip  Generic drug:  glucose blood  TEST UP TO 4 TIMES A DAY.     aspirin 81 MG EC tablet  Take 81 mg by mouth daily.     atorvastatin 80 MG tablet  Commonly known as:  LIPITOR  TAKE 1 TABLET DAILY.     benazepril 20 MG tablet  Commonly known as:  LOTENSIN  Take 20 mg by mouth daily.     DULoxetine 60 MG capsule  Commonly known as:   CYMBALTA  Take 60 mg by mouth 2 (two) times daily.     fenofibrate 145 MG tablet  Commonly known as:  TRICOR  Take 145 mg by mouth daily.     Fish Oil 1200 MG Caps  Take 1 capsule by mouth daily.     FLUoxetine 20 MG capsule  Commonly known as:  PROZAC  Take 20 mg by mouth daily.     furosemide 80 MG tablet  Commonly known as:  LASIX  Take 40-80 mg by mouth 2 (two) times daily. Takes one tablet in the morning and one-half tablet in the evening     gabapentin 300 MG capsule  Commonly known as:  NEURONTIN  Take 300 mg by mouth 3 (three) times daily.     HUMULIN R 500 UNIT/ML injection  Generic drug:  insulin regular human CONCENTRATED  AS DIRECTED UP TO 35 UNITS 3 TIMES DAILY.     HYDROcodone-acetaminophen 10-325 MG tablet  Commonly known as:  NORCO  Take 1 tablet by mouth every 6 (six) hours as needed for moderate pain or severe pain.     Insulin Degludec 200 UNIT/ML Sopn  Commonly known as:  TRESIBA FLEXTOUCH  Inject 160 Units into the skin daily before supper.     insulin glargine 100 UNIT/ML injection  Commonly known as:  LANTUS  Inject 70 Units into the skin 2 (two) times daily.     Liraglutide 18 MG/3ML Sopn  Commonly known as:  VICTOZA  INJECT 1.8 MG SUBCUTANEOUSLY ONCE DAILY.     LITE TOUCH INS SYR .5CC/29G 29G X 1/2" 0.5 ML Misc  Generic drug:  INSULIN SYRINGE .5CC/29G  USE AS DIRECTED TWICE DAILY.     midodrine 5 MG tablet  Commonly known as:  PROAMATINE  Take 5 mg by mouth 2 (two) times daily with a meal.     potassium chloride 10 MEQ tablet  Commonly known as:  K-DUR  TAKE 2 TABLETS BY MOUTH ONCE DAILY.     tiZANidine 4 MG tablet  Commonly known as:  ZANAFLEX  Take 4 mg by mouth 2 (two) times daily.     Vitamin D (Ergocalciferol) 50000 UNITS Caps capsule  Commonly known as:  DRISDOL  TAKE 1 CAPSULE TWICE A WEEK        Allergies: No Known Allergies  Past Medical History  Diagnosis Date  . Sleep apnea     Dr. Brandon Melnick  . Edema     Venous  insufficiency  . Proteinuria   . Obesity   . Diabetes mellitus   . Hypertension     Benign  . Precordial pain 12/23/09    Nuclear stress; no ischemia; EF 60%  . Hyperlipidemia   . History of echocardiogram 12/09    EF 60-65%; mild increased RV; 38 mmHg  . Syncope and collapse   . Dizziness   . Numbness     hands, feet, legs  . Insomnia     Past Surgical History  Procedure Laterality Date  . 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 Total knee arthroplasty Left 2013    Family History  Problem Relation Age of Onset  . Heart attack Mother 93  . Cancer Mother     Breast  . Heart attack Father 77  . Cancer Sister     Breast  . Cancer Sister     Colon    Social History:  reports that he has quit smoking. He has never used smokeless tobacco. He reports that he does not drink alcohol or use illicit drugs.  Review of Systems:  Blackout episodes with leg weakness. This occurs for only a minute or so and during this time he cannot hear or talk but his vision is fairly good; it is followed by headache.  BP and glucose during the episode have been checked by his wife  However on her recent episode in the ER his blood pressure was quite low and he was given fluids and told to reduce his Lasix  Hypertension: currently on benazepril, Lasix and blood pressure is low normal today Not clear why his medication was changed to benazepril despite relatively high creatinine No history of edema   Lipids: triglycerides are  relatively high despite taking fenofibrate. LDL is below 100, taking Lipitor   Lab Results  Component Value Date   CHOL 154 12/30/2013   HDL 36.50* 12/30/2013   LDLCALC 83 10/13/2014   LDLDIRECT 77.9 05/20/2013   TRIG 324.0* 12/30/2013   CHOLHDL 4 12/30/2013      CKD: His creatinine has ranged from 1.4-2.1 in the last 2 years , etiology unclear, relatively worse recently in the ER  Lab Results  Component Value Date    CREATININE 2.83* 03/24/2015   He has been told to have CHF and is taking Lasix  Examination:   BP 95/60 mmHg  Pulse 82  Temp(Src) 98.2 F (36.8 C)  Resp 16  Ht 5\' 9"  (1.753 m)  Wt 309 lb (140.161 kg)  BMI 45.61 kg/m2  SpO2 97%  Body mass index is 45.61 kg/(m^2).    ASSESSMENT/ PLAN:   Diabetes type 2  See history of present illness for detailed analysis of his blood sugar patterns and day-to-day management  Blood glucose control is again poorly controlled for various reasons as discussed above He is quite insulin resistant Also has variability in his blood sugars Most of his high readings now are late in the evening and fasting readings are somewhat better with increasing Lantus Also has gained weight  He is still a candidate for using insulin pump but will need to confirm his low C-peptide level with fasting glucose of under 200  Recommendations made today:  Trial of Tresiba  Increase U-500 insulin at breakfast and suppertime as discussed below  More consistent monitoring at various times  Low fat meals  ORTHOSTATIC hypotension: He is still orthostatic today and this may be a  reason for his syncopal episodes Will reduce his benazepril to half tablet today and may not need any. Consider ProAmatine if blood pressure still tends to be low standing up  HYPERLIPIDEMIA: We will need follow-up levels  RENAL dysfunction: Recently likely to be from relatively low blood pressure and use of benazepril  Patient Instructions  U-500 insulin take 18 units with first meal 18 at lunch and 24 at supper (6-7 pm) with 14 at bedtime  Tresiba 160 at dinner, replace lantus  Cut benazepril in 1/2  Check blood sugars on waking up .. 5 .. times a week Also check blood sugars about 2 hours after a meal and do this after different meals by rotation Recommended blood sugar levels on waking up is 90-130 and about 2 hours after meal is 140-180 Please bring blood sugar monitor to each  visit.   Counseling time on subjects discussed above is over 50% of today's 25 minute visit    Adisynn Suleiman 04/30/2015, 9:20 AM

## 2015-05-19 ENCOUNTER — Other Ambulatory Visit: Payer: Self-pay | Admitting: Endocrinology

## 2015-05-27 ENCOUNTER — Ambulatory Visit (INDEPENDENT_AMBULATORY_CARE_PROVIDER_SITE_OTHER): Payer: Medicaid Other | Admitting: Endocrinology

## 2015-05-27 ENCOUNTER — Encounter: Payer: Self-pay | Admitting: Endocrinology

## 2015-05-27 VITALS — BP 118/78 | HR 71 | Temp 98.4°F | Resp 16 | Ht 69.0 in | Wt 310.2 lb

## 2015-05-27 DIAGNOSIS — N183 Chronic kidney disease, stage 3 unspecified: Secondary | ICD-10-CM

## 2015-05-27 DIAGNOSIS — IMO0002 Reserved for concepts with insufficient information to code with codable children: Secondary | ICD-10-CM

## 2015-05-27 DIAGNOSIS — E782 Mixed hyperlipidemia: Secondary | ICD-10-CM | POA: Diagnosis not present

## 2015-05-27 DIAGNOSIS — E1065 Type 1 diabetes mellitus with hyperglycemia: Secondary | ICD-10-CM

## 2015-05-27 DIAGNOSIS — Z23 Encounter for immunization: Secondary | ICD-10-CM | POA: Diagnosis not present

## 2015-05-27 DIAGNOSIS — Z794 Long term (current) use of insulin: Secondary | ICD-10-CM | POA: Diagnosis not present

## 2015-05-27 DIAGNOSIS — E1165 Type 2 diabetes mellitus with hyperglycemia: Secondary | ICD-10-CM | POA: Diagnosis not present

## 2015-05-27 LAB — BASIC METABOLIC PANEL
BUN: 48 mg/dL — AB (ref 6–23)
CALCIUM: 10.3 mg/dL (ref 8.4–10.5)
CO2: 28 mEq/L (ref 19–32)
CREATININE: 2.54 mg/dL — AB (ref 0.40–1.50)
Chloride: 98 mEq/L (ref 96–112)
GFR: 28.21 mL/min — AB (ref >60.00)
GLUCOSE: 148 mg/dL — AB (ref 70–99)
POTASSIUM: 4.1 meq/L (ref 3.5–5.1)
Sodium: 133 mEq/L — ABNORMAL LOW (ref 135–145)

## 2015-05-27 LAB — LIPID PANEL
CHOL/HDL RATIO: 4
CHOLESTEROL: 161 mg/dL (ref 0–200)
HDL: 37.1 mg/dL — ABNORMAL LOW (ref >39.00)
NonHDL: 123.46
Triglycerides: 228 mg/dL — ABNORMAL HIGH (ref 0.0–149.0)
VLDL: 45.6 mg/dL — ABNORMAL HIGH (ref 0.0–40.0)

## 2015-05-27 LAB — LDL CHOLESTEROL, DIRECT: LDL DIRECT: 92 mg/dL

## 2015-05-27 LAB — POCT GLYCOSYLATED HEMOGLOBIN (HGB A1C): Hemoglobin A1C: 10.7

## 2015-05-27 NOTE — Patient Instructions (Addendum)
For eating large meals with more fat go up 4 units on U-500 insulin  U-500 insulin take 16 units with first meal 20 at lunch and 24 at supper (6-7 pm) with 14 at bedtime  Follow up with Kidney Dr

## 2015-05-27 NOTE — Progress Notes (Signed)
Patient ID: Ethan Campbell, male   DOB: 11/26/1960, 54 y.o.   MRN: [id number]   Reason for Appointment: Diabetes follow-up   History of Present Illness   Diagnosis: Type 2 DIABETES MELITUS, date of diagnosis: 2000    Previous history: he has been on insulin for several years with consistently poor control Has been requiring large doses of insulin for his diabetes but A1c has been persistently high Blood sugars did not improve significantly even with trying Byetta and Victoza In 2014 he was switched from NovoLog to U-500 insulin but not clear if he has had improvement in control except with fasting readings His prior A1c was 14.0 in 5/14 and he had educational discussions with diabetes educator and dietitian in 6/14   Recent history:  Insulin regimen:  U-500 insulin take 16 units with first meal 18 at lunch and 24 at supper (6-7 pm) with 14 at bedtime  LANTUS 70 IN AM AND 70 IN PM    He was supposed to start on Tresiba but this has been denied by his insurance Also the U-500 insulin was increased at suppertime and breakfast time, currently taking this 4 times a day and is compliant with it His blood sugars are still very difficult to control with blood sugars averaging 250 recently However his A1c is slightly better at 10.7 He also continues to take Victoza  Blood sugar patterns and problems identified:  He is having variable blood sugars at all times including fasting; blood sugar was better this morning since he took extra 6 units for high postprandial reading last night  Highest glucose was 500 last night after eating [place] food.  He does not always check readings before his first meal and these are quite variable, recently mostly high; not able to decipher which readings are before or after meals on his meter download  His blood sugars tend to be relatively higher after lunch and before supper but are variable late at night  HYPOGLYCEMIA: He has had sporadic  episodes early morning, before 1 PM and also at bedtime once  His diet is inconsistent both timing and type of meals and he is not able to count carbohydrates or stay with low fat meals all the time     His C-peptide level was 0.9 in 2015 but he did not pursue the insulin pump as directed  Oral hypoglycemic drugs: none  (renal dysfunction)     Side effects from medications: None  Proper timing of medications in relation to meals: Yes.    Meals:  2 meals per day. eating generally at  11 am-1 pm and 6-8 pm. Occasionally high fat meals Last dietitian visit: 6/14 and last CDE visit in 4/16       Monitors blood glucose:  0.8 times a day.    Glucometer:  Accu-Chek     Blood Glucose readings from meter download (not clear which are postprandial):  Mean values apply above for all meters except median for One Touch  PRE-MEAL Fasting Lunch Dinner  overnight  Overall  Glucose range:  150-416   134-280   235-365  51-359    Mean/median:    300   182   251    POST-MEAL PC Breakfast PC Lunch PC Dinner  Glucose range:    52-500   Mean/median:      Physical activity: exercise: some yardwork or other activities  Wt Readings from Last 3 Encounters:  05/27/15 310 lb 3.2 oz (140.706 kg)  04/29/15  309 lb (140.161 kg)  02/18/15 305 lb 12.8 oz (138.71 kg)    LABS:   Lab Results  Component Value Date   HGBA1C 10.7 05/27/2015   HGBA1C 12 02/18/2015   HGBA1C 15.0* 10/13/2014   Lab Results  Component Value Date   MICROALBUR 7.0* 12/30/2013   LDLCALC 83 10/13/2014   CREATININE 2.83* 03/24/2015        Medication List       This list is accurate as of: 05/27/15  3:37 PM.  Always use your most recent med list.               ACCU-CHEK AVIVA PLUS test strip  Generic drug:  glucose blood  TEST UP TO 4 TIMES A DAY.     aspirin 81 MG EC tablet  Take 81 mg by mouth daily.     atorvastatin 80 MG tablet  Commonly known as:  LIPITOR  TAKE 1 TABLET DAILY.     benazepril 20 MG tablet    Commonly known as:  LOTENSIN  Take 20 mg by mouth daily.     DULoxetine 60 MG capsule  Commonly known as:  CYMBALTA  Take 60 mg by mouth 2 (two) times daily.     fenofibrate 145 MG tablet  Commonly known as:  TRICOR  Take 145 mg by mouth daily.     Fish Oil 1200 MG Caps  Take 1 capsule by mouth daily.     FLUoxetine 20 MG capsule  Commonly known as:  PROZAC  Take 20 mg by mouth daily.     furosemide 80 MG tablet  Commonly known as:  LASIX  Take 40-80 mg by mouth 2 (two) times daily. Takes one tablet in the morning and one-half tablet in the evening     gabapentin 300 MG capsule  Commonly known as:  NEURONTIN  Take 300 mg by mouth 3 (three) times daily.     HUMULIN R 500 UNIT/ML injection  Generic drug:  insulin regular human CONCENTRATED  AS DIRECTED UP TO 35 UNITS 3 TIMES DAILY.     HYDROcodone-acetaminophen 10-325 MG tablet  Commonly known as:  NORCO  Take 1 tablet by mouth every 6 (six) hours as needed for moderate pain or severe pain.     Insulin Degludec 200 UNIT/ML Sopn  Commonly known as:  TRESIBA FLEXTOUCH  Inject 160 Units into the skin daily before supper.     insulin glargine 100 UNIT/ML injection  Commonly known as:  LANTUS  Inject 70 Units into the skin 2 (two) times daily.     Liraglutide 18 MG/3ML Sopn  Commonly known as:  VICTOZA  INJECT 1.8 MG SUBCUTANEOUSLY ONCE DAILY.     LITE TOUCH INS SYR .5CC/29G 29G X 1/2" 0.5 ML Misc  Generic drug:  INSULIN SYRINGE .5CC/29G  USE AS DIRECTED TWICE DAILY.     midodrine 5 MG tablet  Commonly known as:  PROAMATINE  Take 5 mg by mouth 2 (two) times daily with a meal.     potassium chloride 10 MEQ tablet  Commonly known as:  K-DUR  TAKE 2 TABLETS BY MOUTH ONCE DAILY.     tiZANidine 4 MG tablet  Commonly known as:  ZANAFLEX  Take 4 mg by mouth 2 (two) times daily.     Vitamin D (Ergocalciferol) 50000 UNITS Caps capsule  Commonly known as:  DRISDOL  TAKE 1 CAPSULE TWICE A WEEK.        Allergies:  No Known Allergies  Past  Medical History  Diagnosis Date  . Sleep apnea     Dr. Brandon Melnick  . Edema     Venous insufficiency  . Proteinuria   . Obesity   . Diabetes mellitus   . Hypertension     Benign  . Precordial pain 12/23/09    Nuclear stress; no ischemia; EF 60%  . Hyperlipidemia   . History of echocardiogram 12/09    EF 60-65%; mild increased RV; 38 mmHg  . Syncope and collapse   . Dizziness   . Numbness     hands, feet, legs  . Insomnia     Past Surgical History  Procedure Laterality Date  . 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 Total knee arthroplasty Left 2013    Family History  Problem Relation Age of Onset  . Heart attack Mother 54  . Cancer Mother     Breast  . Heart attack Father 54  . Cancer Sister     Breast  . Cancer Sister     Colon    Social History:  reports that he has quit smoking. He has never used smokeless tobacco. He reports that he does not drink alcohol or use illicit drugs.  Review of Systems:  Blackout episodes: He has had some episodes where he reportedly has no pulse or blood pressure but does not remember when he had the last episode and has not seen a cardiologist yet  Hypertension: currently on benazepril, Lasix and blood pressure is not as low His benazepril was reduced to half tablet on the last visit He still takes midodrine from his PCP  Lipids: triglycerides have been relatively high despite taking fenofibrate. LDL is below 100, taking Lipitor   Lab Results  Component Value Date   CHOL 154 12/30/2013   HDL 36.50* 12/30/2013   LDLCALC 83 10/13/2014   LDLDIRECT 77.9 05/20/2013   TRIG 324.0* 12/30/2013   CHOLHDL 4 12/30/2013      CKD: His creatinine has ranged from 1.4-2.1 in the last 2 years , etiology unclear He has not had a follow-up with his nephrologist and not clear if he had labs done by PCP  Lab Results  Component Value Date   CREATININE 2.83* 03/24/2015    He has been told to have CHF and is taking Lasix along with potassium  Examination:   BP 118/78 mmHg  Pulse 71  Temp(Src) 98.4 F (36.9 C)  Resp 16  Ht 5\' 9"  (1.753 m)  Wt 310 lb 3.2 oz (140.706 kg)  BMI 45.79 kg/m2  SpO2 97%  Body mass index is 45.79 kg/(m^2).   Standing blood pressure 120/72  ASSESSMENT/ PLAN:   Diabetes type 2  See history of present illness for detailed analysis of his blood sugar patterns and day-to-day management  Blood glucose control is again poorly controlled for various reasons as discussed  He is quite insulin resistant requiring significant amounts of both basal and bolus insulin Also has marked variability in his blood sugars, some of this is related to his diet as well as variability in the action of the U-500 insulin including overnight  Most of his high readings now are before supper but he tends to have sporadic hypoglycemia either overnight or in the early afternoon He does not adjust his insulin based on what he is eating  Recommendations made today:  Trial of Tyler Aas, will try to get this prior authorized  Increase U-500 insulin  at lunchtime by at least 2 units  He will adjust his mealtime doses by 4 units if eating larger meals or going out to eat  More consistent monitoring at various times  Low fat meals  ORTHOSTATIC hypotension: He is doing better with reducing his benazepril He will follow-up with his PCP and cardiologist regarding other problems including passing out episodes  HYPERLIPIDEMIA: We will need follow-up levels  RENAL dysfunction: Needs follow-up levels and also visit with nephrologist  Patient Instructions  For eating large meals with more fat go up 4 units on U-500 insulin  U-500 insulin take 16 units with first meal 20 at lunch and 24 at supper (6-7 pm) with 14 at bedtime  Follow up with Kidney Dr     Counseling time on subjects discussed above is over 50% of today's 25 minute  visit    Ethan Campbell 05/27/2015, 3:37 PM

## 2015-06-03 ENCOUNTER — Other Ambulatory Visit: Payer: Self-pay | Admitting: Endocrinology

## 2015-06-08 ENCOUNTER — Encounter: Payer: Self-pay | Admitting: Neurology

## 2015-06-08 ENCOUNTER — Ambulatory Visit (INDEPENDENT_AMBULATORY_CARE_PROVIDER_SITE_OTHER): Payer: Medicaid Other | Admitting: Neurology

## 2015-06-08 VITALS — BP 152/92 | HR 74 | Ht 69.0 in | Wt 307.1 lb

## 2015-06-08 DIAGNOSIS — E1143 Type 2 diabetes mellitus with diabetic autonomic (poly)neuropathy: Secondary | ICD-10-CM | POA: Diagnosis not present

## 2015-06-08 DIAGNOSIS — R55 Syncope and collapse: Secondary | ICD-10-CM | POA: Diagnosis not present

## 2015-06-08 DIAGNOSIS — E785 Hyperlipidemia, unspecified: Secondary | ICD-10-CM | POA: Diagnosis not present

## 2015-06-08 DIAGNOSIS — I951 Orthostatic hypotension: Secondary | ICD-10-CM | POA: Diagnosis not present

## 2015-06-08 NOTE — Progress Notes (Signed)
STROKE NEUROLOGY FOLLOW UP NOTE  NAME: Ethan Campbell DOB: [birth year]  REASON FOR VISIT: stroke follow up HISTORY FROM: chart and pt  Today we had the pleasure of seeing Ethan Campbell in follow-up at our Neurology Clinic. Pt was accompanied by no one.   History Summary Ethan Campbell is a 54 y.o. male with PMH of hypertension, hyperlipidemia, uncontrolled diabetes and chronic kidney disease was seen on 03/17/14 in clinic for dizzy and passing out spells for about a year. Outpatient workup for cardiac source, was told to have mild lobular regurgitation and congestive heart failure, but not able to explain the spells. Had MRI brain did not show acute stroke but ischemic white matter changes. His dizziness and passing out happened most time with sitting up or standing up associated with low BP, concerning for diabetic neuropathy with autonomic dysfunction. In clinic orthostatic vitals showed down trending BPs. His A1C 14.3. Recommended to have MRA, EEG, and carotid doppler, as well as compressing stockings and tight control glucose.   05/01/15 follow up - the patient has been doing better, no passing out episodes. He still feel some dizziness on standing or sitting up. His carotid doppler unremarkable, but MRA was not done yet. He went to see Dr. Dwyane Dee endocrinologist for better DM control, but his glucose still up and down and repeat A1C still high at 14.3. His atenolol has been stopped and his BP at home 140-150s, today in clinic 141/82. He has lost 30 lbs since last visit intentionally. Still complains of tingling and numbness at bilateral fingertips and toes.  08/20/14 follow up - he was doing much better. He was started by his PCP on midodrine 2.5mg  in am. He stated that he takes once a day. He also takes amlodipine 10mg  in am. However, PCP does not want him to be on droxidopa. He stated that his syncope and near syncope episodes are much less but still happens intermittently. His BP 146/89 in  clinic today.  11/27/14 follow up - he has been doing well. No syncope or near syncope episodes. He stated that occasionally he felt dizzy when he stand up then he will bend down and it helps. His A1C on 10/13/14 was 15.0 and sugar level still fluctuating a lot. He follows with Dr. Dwyane Dee for DM and recently changed insulin regimen. For the last two weeks, his sugar less than 200 but this morning it was 55. His LDL on 10/13/14 was 83. He is on midodrine 5mg  tid now as per chart but pt stated that he only takes twice a day.   Interval History During the interval time, pt had ER visit due to one episode of decreased level of consciousness. On EMS arrival, CBG 312 and BP 84/56, and on standing BP 71/64. Was given IVF. Symptoms resolved. He is on midodrine, but low dose just increased from 2.5mg  bid to 5mg  bid. He is also on lasix 40/20. Today in clinic his BP 152/92. He has been following with Dr. Dwyane Dee for DM after discharge.   REVIEW OF SYSTEMS: Full 14 system review of systems performed and notable only for those listed below and in HPI above, all others are negative:  Activity change, fatigue, hearing loss, ringing in ears. Runny nose, eye discharge, blurry vision, cough, chest pain, excessive thirst, constipation, diarrhea, insomnia, snoring, apnea, frequency of urination, urgency, joint pain, back pain, aching muscles, muscle cramps, moles, itching, memory loss, dizziness, HA, numbness, seizure, speech difficulty, weakness, tremors, passing, agitation, decreased concentration, depression  The  following represents the patient's updated allergies and side effects list: No Known Allergies  Labs since last visit of relevance include the following: Results for orders placed or performed in visit on 123456  Basic metabolic panel  Result Value Ref Range   Sodium 133 (L) 135 - 145 mEq/L   Potassium 4.1 3.5 - 5.1 mEq/L   Chloride 98 96 - 112 mEq/L   CO2 28 19 - 32 mEq/L   Glucose, Bld 148 (H) 70 - 99  mg/dL   BUN 48 (H) 6 - 23 mg/dL   Creatinine, Ser 2.54 (H) 0.40 - 1.50 mg/dL   Calcium 10.3 8.4 - 10.5 mg/dL   GFR 28.21 (L) >60.00 mL/min  Lipid panel  Result Value Ref Range   Cholesterol 161 0 - 200 mg/dL   Triglycerides 228.0 (H) 0.0 - 149.0 mg/dL   HDL 37.10 (L) >39.00 mg/dL   VLDL 45.6 (H) 0.0 - 40.0 mg/dL   Total CHOL/HDL Ratio 4    NonHDL 123.46   LDL cholesterol, direct  Result Value Ref Range   Direct LDL 92.0 mg/dL  POCT HgB A1C  Result Value Ref Range   Hemoglobin A1C 10.7     The neurologically relevant items on the patient's problem list were reviewed on today's visit.  Neurologic Examination  A problem focused neurological exam (12 or more points of the single system neurologic examination, vital signs counts as 1 point, cranial nerves count for 8 points) was performed.  Blood pressure 152/92, pulse 74, height 5\' 9"  (1.753 m), weight 307 lb 1.6 oz (139.3 kg).   Lying 126/71 - 64, sitting 118/74 - 67 and standing 114/67 - 66  General - overweight, well developed, in no apparent distress.  Ophthalmologic - Sharp disc margins OU.  Cardiovascular - Regular rate and rhythm with no murmur.  Neck - supple, no nuchal rigidity .  Mental Status -  Level of arousal and orientation to time, place, and person were intact.  Language including expression, naming, repetition, comprehension was assessed and found intact.  Cranial Nerves II - XII -  II - Visual field intact OU.  III, IV, VI - Extraocular movements intact.  V - Facial sensation intact bilaterally.  VII - Facial movement intact bilaterally.  VIII - Hearing & vestibular intact bilaterally.  X - Palate elevates symmetrically.  XI - Chin turning & shoulder shrug intact bilaterally.  XII - Tongue protrusion intact.  Motor Strength - The patient's strength was normal in all extremities and pronator drift was absent. Bulk was normal and fasciculations were absent.  Motor Tone - Muscle tone was assessed at the  neck and appendages and was normal.  Reflexes - The patient's reflexes were 1+ in all extremities and he had no pathological reflexes.  Sensory - Light touch, temperature/pinprick symmetrical, but vibration and proprioception diminished below ankle.  Coordination - The patient had normal movements in the hands and feet with no ataxia or dysmetria. Tremor was absent.  Gait and Station - The patient's transfers, posture, gait, station, and turns were observed as normal, but recommended to walk and stand up slow..   Data reviewed: I personally reviewed the images and agree with the radiology interpretations.  MRI brain - 02/27/2014 impression: 1. No evidence of acute intracranial abnormality or mass. 2. Moderate age advanced white matter disease. This is nonspecific but is most often seen in the setting of chronic small vessel ischemia, with other considerations including sequelae of prior,, migraines, pes planus, and hypercoagulable state.  Demyelinating disease is also possible, however the appearance is not particularly suggestive of this. 3. Two fourths I of remote microhemorrhages in the high frontal loss, possibly secondary to remote small vessel infarcts or trauma.  EEG 03/19/14 - This is a normal EEG recording in the waking state.  No evidence of ictal or interictal discharges are seen.  CUS 04/03/14 - 1. Mild bilateral carotid atherosclerotic vascular disease again  noted. No flow-limiting stenosis. No significant change prior exam.  2. Vertebrals patent with antegrade flow.  2D echo 05/11/12 - EF 60-65%  Component     Latest Ref Rng 05/20/2013 08/21/2013 12/30/2013 04/02/2014  Cholesterol     0 - 200 mg/dL 150  154   Triglycerides     0.0 - 149.0 mg/dL 265.0 (H)  324.0 (H)   HDL     >39.00 mg/dL 37.30 (L)  36.50 (L)   VLDL     0.0 - 40.0 mg/dL 53.0 (H)  64.8 (H)   LDL (calc)     0 - 99 mg/dL   53   Total CHOL/HDL Ratio      4  4   NonHDL        117.50   Hemoglobin A1C     4.6 - 6.5  % 14.3 (H) 12.6 (H) 14.6 (H) 14.3 (H)   Component     Latest Ref Rng 10/13/2014  LDL (calc)      83  Hemoglobin A1C     4.0 - 6.0 % 15.0 (A)    Assessment: As you may recall, he is a 54 y.o. Caucasian male with PMH of hypertension, hyperlipidemia, uncontrolled diabetes and chronic kidney disease followed up in clinic. From his description of the spells, almost all related to positioning changes from sitting to standing, with prodromal symptoms of syncope and presyncope. Symptoms relieved when sitting down. Orthostatic vitals also trending down when stand up. This spells are most consistent with syncope and presyncope, most likely due to orthostatic hypotension. In the clinic, we checked orthostatic vitals, there is a trending for orthostatic hypotension. He had uncontrolled diabetes, his A1c last checked was 12.6 indicating poorly controlled DM. He had diabetic neuropathy with autonomic dysfunction, which explains his orthostatic hypotension as well as impotance, and finger and toe numbness / tingling. He was given midodrine 5mg  bid and feels much better with less syncope episodes. 03/24/15 had ER visit for low BP and was treated with IVF.  He still has multiple stroke risk factors including hypertension, hyperlipidemia, diabetes, stroke workup showed negative CUS. EEG negative. Latest A1C still at 15 and LDL under control at 83.  Plan:  - continue ASA and lipitor for stroke prevention - follow up with Dr. Dwyane Dee for DM and may consider insulin pump - agree with cardiology referral  - Follow up with your primary care physician for stroke risk factor modification. Recommend maintain blood pressure goal <130/80, diabetes with hemoglobin A1c goal below 7.0% and lipids with LDL cholesterol goal below 100 mg/dL.  - continue to follow up with PCP for orthostatic hypotension. May consider increase midodrine dose or add florinef.  - consider stocking socks up to thigh. - check BP frequently and glucose  at home - follow up in 3 months.  I spent more than 25 minutes of face to face time with the patient. Greater than 50% of time was spent in counseling and coordination of care. We have discussed about autonomic neuropathy and BP control and medication use.   No orders of  the defined types were placed in this encounter.    No orders of the defined types were placed in this encounter.    Patient Instructions  - continue ASA and lipitor for stroke prevention - follow up with Dr. Dwyane Dee for DM and may consider insulin pump - agree with cardiology referral  - Follow up with your primary care physician for stroke risk factor modification. Recommend maintain blood pressure goal <130/80, diabetes with hemoglobin A1c goal below 7.0% and lipids with LDL cholesterol goal below 100 mg/dL.  - continue to follow up with PCP for orthostatic hypotension. May consider increase midodrine dose or add florinef.  - consider stocking socks up to thigh. - check BP frequently and glucose at home - follow up in 3 months.   Rosalin Hawking, MD PhD [facility] [address] [place],  [address] [phone]

## 2015-06-08 NOTE — Patient Instructions (Addendum)
-   continue ASA and lipitor for stroke prevention - follow up with Dr. Dwyane Dee for DM and may consider insulin pump - agree with cardiology referral  - Follow up with your primary care physician for stroke risk factor modification. Recommend maintain blood pressure goal <130/80, diabetes with hemoglobin A1c goal below 7.0% and lipids with LDL cholesterol goal below 100 mg/dL.  - continue to follow up with PCP for orthostatic hypotension. May consider increase midodrine dose or add florinef.  - consider stocking socks up to thigh. - check BP frequently and glucose at home - follow up in 3 months.

## 2015-07-29 ENCOUNTER — Ambulatory Visit: Payer: Medicaid Other | Admitting: Endocrinology

## 2015-08-05 ENCOUNTER — Encounter: Payer: Self-pay | Admitting: Cardiology

## 2015-08-05 ENCOUNTER — Encounter: Payer: Medicaid Other | Admitting: Cardiology

## 2015-08-05 NOTE — Progress Notes (Signed)
No show  This encounter was created in error - please disregard.

## 2015-08-06 ENCOUNTER — Encounter: Payer: Self-pay | Admitting: Cardiology

## 2015-08-13 LAB — HEMOGLOBIN A1C: HEMOGLOBIN A1C: 12.1

## 2015-08-20 ENCOUNTER — Ambulatory Visit (INDEPENDENT_AMBULATORY_CARE_PROVIDER_SITE_OTHER): Payer: Medicaid Other | Admitting: Endocrinology

## 2015-08-20 ENCOUNTER — Other Ambulatory Visit: Payer: Self-pay | Admitting: *Deleted

## 2015-08-20 ENCOUNTER — Encounter: Payer: Self-pay | Admitting: Endocrinology

## 2015-08-20 VITALS — BP 85/58 | HR 79 | Temp 97.5°F | Resp 16 | Ht 69.0 in | Wt 305.0 lb

## 2015-08-20 DIAGNOSIS — I951 Orthostatic hypotension: Secondary | ICD-10-CM

## 2015-08-20 DIAGNOSIS — E1165 Type 2 diabetes mellitus with hyperglycemia: Secondary | ICD-10-CM

## 2015-08-20 DIAGNOSIS — E1122 Type 2 diabetes mellitus with diabetic chronic kidney disease: Secondary | ICD-10-CM

## 2015-08-20 DIAGNOSIS — N184 Chronic kidney disease, stage 4 (severe): Secondary | ICD-10-CM

## 2015-08-20 DIAGNOSIS — Z794 Long term (current) use of insulin: Secondary | ICD-10-CM

## 2015-08-20 NOTE — Patient Instructions (Addendum)
U-500 insulin take 10 units on waking up even if not eating  Take 18 units with first meal and 26 at supper (6-7 pm) with 8 at bedtime  LANTUS 80 IN AM AND 60 IN PM   No lantus on am starting V-go  Feofibrate take only Mon/Thursdays  Midodrine 10mg  3x daily

## 2015-08-20 NOTE — Progress Notes (Signed)
Patient ID: Ethan Campbell, male   DOB: 1960-12-12, 55 y.o.   MRN: [id number]   Reason for Appointment: Diabetes follow-up   History of Present Illness   Diagnosis: Type 2 DIABETES MELITUS, date of diagnosis: 2000    Previous history: he has been on insulin for several years with consistently poor control Has been requiring large doses of insulin for his diabetes but A1c has been persistently high Blood sugars did not improve significantly even with trying Byetta and Victoza In 2014 he was switched from NovoLog to U-500 insulin but not clear if he has had improvement in control except with fasting readings His prior A1c was 14.0 in 5/14 and he had educational discussions with diabetes educator and dietitian in 6/14   Recent history:  Insulin regimen:  U-500 insulin  18 units with first meal and 20 at supper (6-7 pm) with 12 at bedtime  LANTUS 80 IN AM AND 80 IN PM   Mealtimes: 12 noon, 6 pm  His A1c is now 12.9 and has been persistently poorly controlled His control is difficult because of the labile nature of his diabetes and sporadic unexpected low sugars Overall requiring very large doses of insulin, total insulin dose = 400 units daily or more in U-100 units He also continues to take Victoza  Blood sugar patterns, management and problems identified:  He is having very labile blood sugars at all times including fasting  Blood sugars are being monitored regularly and mostly midday and early afternoon and some at bedtime  Although he is frequently waking up early in the morning at 7-8 AM he may not eat his first meal until midday and will then take his first dose of U-500 insulin  Blood sugars earlier in the morning are generally normal and occasionally low  Also has a few relatively lower blood sugars between 3-7 PM  Otherwise his blood sugars are generally markedly increased, most consistently around bedtime  Also has marked increase in blood sugars in the  afternoons and early evenings frequently  He was supposed to take 24 units of U-500 insulin at SUPPERTIME but is taking only 20 units  Also not clear why he has increased his Lantus from 70 units to 80 units twice a day.  Unable to get Tyler Aas approved by his insurance  HYPOGLYCEMIA: He has had sporadic episodes at [place] or 7 PM early morning, before 1 PM and also at bedtime once  His diet is inconsistent both timing and type of meals and he is not able to count carbohydrates or stay with low fat meals all the time     His C-peptide level was 0.9 in 2015 but he did not pursue the insulin pump as directed  Oral hypoglycemic drugs: none  (renal dysfunction)     Side effects from medications: None  Proper timing of medications in relation to meals: Yes, usually 30 minutes before eating .    Last dietitian visit: 6/14 and last CDE visit in 4/16       Monitors blood glucose:  1-2 times a day.    Glucometer:  Accu-Chek     Blood Glucose readings from meter download (not clear which are postprandial):  Mean values apply above for all meters except median for One Touch  PRE-MEAL Fasting  Dinner Bedtime Overall  Glucose range:  46-409    98-576   245-515    Mean/median:  260     397   312    Physical  activity: exercise: Unable to recently   Wt Readings from Last 3 Encounters:  08/20/15 305 lb (138.347 kg)  06/08/15 307 lb 1.6 oz (139.3 kg)  05/27/15 310 lb 3.2 oz (140.706 kg)    LABS:   Lab Results  Component Value Date   HGBA1C 10.7 05/27/2015   HGBA1C 12 02/18/2015   HGBA1C 15.0* 10/13/2014   Lab Results  Component Value Date   MICROALBUR 7.0* 12/30/2013   LDLCALC 83 10/13/2014   CREATININE 2.54* 05/27/2015        Medication List       This list is accurate as of: 08/20/15  8:47 PM.  Always use your most recent med list.               ACCU-CHEK AVIVA PLUS test strip  Generic drug:  glucose blood  TEST UP TO 4 TIMES A DAY.     amLODipine 10 MG tablet    Commonly known as:  NORVASC  Take 10 mg by mouth 2 (two) times daily.     aspirin 81 MG EC tablet  Take 81 mg by mouth daily.     atorvastatin 80 MG tablet  Commonly known as:  LIPITOR  TAKE 1 TABLET DAILY.     benazepril 20 MG tablet  Commonly known as:  LOTENSIN  Take 20 mg by mouth daily.     DULoxetine 60 MG capsule  Commonly known as:  CYMBALTA  Take 60 mg by mouth 2 (two) times daily.     EASY TOUCH INSULIN SYRINGE 31G X 5/16" 0.5 ML Misc  Generic drug:  Insulin Syringe-Needle U-100  USE AS DIRECTED TWICE DAILY.     fenofibrate 145 MG tablet  Commonly known as:  TRICOR  Take 145 mg by mouth daily.     Fish Oil 1200 MG Caps  Take 1 capsule by mouth daily.     FLUoxetine 20 MG capsule  Commonly known as:  PROZAC  Take 20 mg by mouth daily.     furosemide 80 MG tablet  Commonly known as:  LASIX  Take 40-80 mg by mouth 2 (two) times daily. Takes one tablet in the morning and one-half tablet in the evening     gabapentin 300 MG capsule  Commonly known as:  NEURONTIN  Take 300 mg by mouth 3 (three) times daily.     HUMULIN R 500 UNIT/ML injection  Generic drug:  insulin regular human CONCENTRATED  AS DIRECTED UP TO 35 UNITS 3 TIMES DAILY.     HYDROcodone-acetaminophen 10-325 MG tablet  Commonly known as:  NORCO  Take 1 tablet by mouth every 6 (six) hours as needed for moderate pain or severe pain.     insulin glargine 100 UNIT/ML injection  Commonly known as:  LANTUS  Inject 70 Units into the skin 2 (two) times daily.     Liraglutide 18 MG/3ML Sopn  Commonly known as:  VICTOZA  INJECT 1.8 MG SUBCUTANEOUSLY ONCE DAILY.     midodrine 5 MG tablet  Commonly known as:  PROAMATINE  Take 5 mg by mouth 2 (two) times daily with a meal.     potassium chloride 10 MEQ tablet  Commonly known as:  K-DUR  TAKE 2 TABLETS BY MOUTH ONCE DAILY.     tiZANidine 4 MG tablet  Commonly known as:  ZANAFLEX  Take 4 mg by mouth 2 (two) times daily.     Vitamin D  (Ergocalciferol) 50000 units Caps capsule  Commonly known as:  DRISDOL  TAKE 1 CAPSULE TWICE A WEEK.        Allergies: No Known Allergies  Past Medical History  Diagnosis Date  . Sleep apnea     Dr. Brandon Melnick  . Venous insufficiency   . Proteinuria   . Obesity   . Type 2 diabetes mellitus ([place])   . Essential hypertension   . Precordial pain June 2011    Nuclear stress; no ischemia; EF 60%  . Hyperlipidemia   . Insomnia   . History of stroke     Past Surgical History  Procedure Laterality Date  . Resection bone tumor femur  1980's    Left femur, [facility] at [facility] with bone graft  . Total knee arthroplasty Left 2013    Family History  Problem Relation Age of Onset  . Heart attack Mother 25  . Breast cancer Mother   . Stroke Mother   . Heart attack Father 36  . Breast cancer Sister   . Colon cancer Sister     Social History:  reports that he has quit smoking. His smoking use included Cigarettes. He has never used smokeless tobacco. He reports that he does not drink alcohol or use illicit drugs.  Review of Systems:  Blackout episodes: He has had some episodes and also has had difficulty with orthostatic hypotension Now being treated with midodrine especially since his hospitalization and has been recommended despite neurologist Has also been evaluated by cardiologist  Hypertension:  previously on benazepril   Lipids: triglycerides have been relatively high despite taking fenofibrate. LDL is below 100, taking Lipitor   Lab Results  Component Value Date   CHOL 161 05/27/2015   HDL 37.10* 05/27/2015   LDLCALC 83 10/13/2014   LDLDIRECT 92.0 05/27/2015   TRIG 228.0* 05/27/2015   CHOLHDL 4 05/27/2015      CKD: His creatinine has ranged from 1.4-2.1 in the last 2 years but recently was 3.5.  She has seen a nephrologist the past but has not been recommended going back by his PCP   Lab Results  Component Value Date   CREATININE 2.54* 05/27/2015   He has been  told to have CHF and is taking Lasix along with potassium  Examination:   BP 85/58 mmHg  Pulse 79  Temp(Src) 97.5 F (36.4 C)  Resp 16  Ht 5\' 9"  (1.753 m)  Wt 305 lb (138.347 kg)  BMI 45.02 kg/m2  SpO2 95%  Body mass index is 45.02 kg/(m^2).   Sitting blood pressure was blood pressure 102/68  ASSESSMENT/ PLAN:   Diabetes type 2  See history of present illness for detailed analysis of his blood sugar patterns and day-to-day management  Blood glucose control is again poorly controlled for various reasons as discussed above He is quite insulin resistant requiring significant amounts of both basal and bolus insulin, now taking 400 units total Also has marked variability in his blood sugars The diet continues to be periodically high-fat Although he has been recommended insulin pump most likely he would not be out of manage this on his own  He appears to be recently having high sugars during the day and consistently high at bedtime after his evening meal Fasting blood sugars may be relatively lower especially with taking larger doses of basal insulin  Recommendations made today:  Trial of the V-go pump with the U-500 insulin will try to get this prior authorized if needed; when this is approved we will have him be trained by the nurse educator.  Most likely  will need the 30 unit basal start with and may also use Lantus in the morning  Start taking another dose of U-500 insulin early morning on waking up  Increase average at suppertime to avoid large increases in blood sugars at bedtime  Reduce bedtime U-500 insulin  Reduce evening Lantus by 20 units  Increase U-500 insulin at lunchtime by at least 2 units  He will adjust his mealtime doses by 4 units if eating larger meals or going out to eat  More consistent monitoring at various times  Low fat meals  ORTHOSTATIC hypotension: He is still having orthostasis We'll increase his midodrine to 10 mg 3 times a day He will  follow-up with his PCP and cardiologist regarding other problems including passing out episodes  HYPERLIPIDEMIA: We will reduce his fenofibrate to only twice a week because of worsening renal function  RENAL dysfunction: Needs follow-up  visit with nephrologist for optimal medication management and evaluation of potential dialysis if kidney function continues to worsen  Patient Instructions  U-500 insulin take 10 units on waking up even if not eating  Take 18 units with first meal and 26 at supper (6-7 pm) with 8 at bedtime  LANTUS 80 IN AM AND 60 IN PM   No lantus on am starting V-go  Feofibrate take only Mon/Thursdays  Midodrine 10mg  3x daily     Counseling time on subjects discussed above is over 50% of today's 25 minute visit    Annebelle Bostic 08/20/2015, 8:47 PM

## 2015-08-21 ENCOUNTER — Encounter: Payer: Self-pay | Admitting: *Deleted

## 2015-08-26 ENCOUNTER — Encounter: Payer: Medicaid Other | Admitting: Nutrition

## 2015-08-27 ENCOUNTER — Ambulatory Visit (INDEPENDENT_AMBULATORY_CARE_PROVIDER_SITE_OTHER): Payer: Medicaid Other | Admitting: Nurse Practitioner

## 2015-08-27 ENCOUNTER — Encounter: Payer: Self-pay | Admitting: Nurse Practitioner

## 2015-08-27 VITALS — BP 115/69 | HR 76 | Ht 69.0 in | Wt 305.8 lb

## 2015-08-27 DIAGNOSIS — I10 Essential (primary) hypertension: Secondary | ICD-10-CM | POA: Diagnosis not present

## 2015-08-27 DIAGNOSIS — G40909 Epilepsy, unspecified, not intractable, without status epilepticus: Secondary | ICD-10-CM | POA: Diagnosis not present

## 2015-08-27 DIAGNOSIS — G4710 Hypersomnia, unspecified: Secondary | ICD-10-CM | POA: Diagnosis not present

## 2015-08-27 DIAGNOSIS — E785 Hyperlipidemia, unspecified: Secondary | ICD-10-CM

## 2015-08-27 DIAGNOSIS — R400 Somnolence: Secondary | ICD-10-CM

## 2015-08-27 DIAGNOSIS — I951 Orthostatic hypotension: Secondary | ICD-10-CM

## 2015-08-27 NOTE — Patient Instructions (Signed)
Sleep study MRI of the brain without Cr. 3.4 EEG new onset seizure  continue ASA and lipitor for stroke prevention Continue follow up with Dr. Dwyane Dee for DM Increase Midrodrine to 10mg  Three times daily this will help with dizziness and orthostatic blood pressure Follow up with your primary care physician for stroke risk factor modification. Recommend maintain blood pressure goal <130/80,  diabetes with hemoglobin A1c goal below 7.0%  lipids with LDL cholesterol goal below 80 mg/dL.    check BP frequently and glucose at home F/U with Dr Erlinda Hong in 1 month

## 2015-08-27 NOTE — Progress Notes (Signed)
[facility]  PATIENT: Ethan Campbell DOB: Sep 24, 1960   REASON FOR VISIT: Diabetic autonomic neuropathy associated with type 2 diabetes, orthostatic hypotension, syncope and collapse, hyperlipidemia, new onset seizure  HISTORY FROM: Patient wife and daughter    HISTORY OF PRESENT ILLNESS:Ethan Campbell is a 55 y.o. male with PMH of hypertension, hyperlipidemia, uncontrolled diabetes and chronic kidney disease was seen on 03/17/14 in clinic for dizzy and passing out spells for about a year. Outpatient workup for cardiac source, was told to have mild lobular regurgitation and congestive heart failure, but not able to explain the spells. Had MRI brain did not show acute stroke but ischemic white matter changes. His dizziness and passing out happened most time with sitting up or standing up associated with low BP, concerning for diabetic neuropathy with autonomic dysfunction. In clinic orthostatic vitals showed down trending BPs. His A1C 14.3. Recommended to have MRA, EEG, and carotid doppler, as well as compressing stockings and tight control glucose.   05/01/15 follow up - the patient has been doing better, no passing out episodes. He still feel some dizziness on standing or sitting up. His carotid doppler unremarkable, but MRA was not done yet. He went to see Dr. Dwyane Dee endocrinologist for better DM control, but his glucose still up and down and repeat A1C still high at 14.3. His atenolol has been stopped and his BP at home 140-150s, today in clinic 141/82. He has lost 30 lbs since last visit intentionally. Still complains of tingling and numbness at bilateral fingertips and toes.  08/20/14 follow up - he was doing much better. He was started by his PCP on midodrine 2.5mg  in am. He stated that he takes once a day. He also takes amlodipine 10mg  in am. However, PCP does not want him to be on droxidopa. He stated that his syncope and near syncope episodes are much less but still  happens intermittently. His BP 146/89 in clinic today.  11/27/14 follow up - he has been doing well. No syncope or near syncope episodes. He stated that occasionally he felt dizzy when he stand up then he will bend down and it helps. His A1C on 10/13/14 was 15.0 and sugar level still fluctuating a lot. He follows with Dr. Dwyane Dee for DM and recently changed insulin regimen. For the last two weeks, his sugar less than 200 but this morning it was 55. His LDL on 10/13/14 was 83. He is on midodrine 5mg  tid now as per chart but pt stated that he only takes twice a day.   Interval History11/14/16 During the interval time, pt had ER visit due to one episode of decreased level of consciousness. On EMS arrival, CBG 312 and BP 84/56, and on standing BP 71/64. Was given IVF. Symptoms resolved. He is on midodrine, but low dose just increased from 2.5mg  bid to 5mg  bid. He is also on lasix 40/20. Today in clinic his BP 152/92. He has been following with Dr. Dwyane Dee for DM after discharge.  UPDATE 2/2/17CM Ethan Campbell, 55 year old morbidly obese male returns for follow-up. He had an appointment with Dr.Xu next week but he just got out of the hospital at [facility] in [place]. He was admitted after becoming unresponsive with loss of consciousness incontinence of urine chewing his tongue, this was witnessed by his daughter . He said he has seizures as a child but none as an adult. He had 2 seizure events. Daughter relates that he was in ICU for 4 days, she is a Marine scientist.  He was on dopamine drip and IV fluids. He did not have MRI of the head or CT. He continues to have problems with orthostatic hypotension and Dr. Dwyane Dee increased his Midrodine yesterday to 10 mg 3 times a day. Patient has not started this. Standing blood pressure today 109/67 In addition his hemoglobin A1c was 12.5 and he had changes to his insulin. He also reports today that he has a history of obstructive sleep apnea however he does not use his CPAP because his mask does  not fit. He has not used it in quite some time. He complains of daytime drowsiness and has elevated ESS and fatigue severity scores. He returns on an urgent basis   REVIEW OF SYSTEMS: Full 14 system review of systems performed and notable only for those listed, all others are neg:  Constitutional: Fatigue  Cardiovascular: neg Ear/Nose/Throat: Hearing loss  Skin: neg Eyes: Blurred vision, light sensitivity itching Respiratory: Cough wheezing or shortness of breath Gastroitestinal: Constipation and urinary frequency Hematology/Lymphatic: neg  Endocrine: neg Musculoskeletal: Joint swelling or joint pain walking difficulty Allergy/Immunology: neg Neurological: Dizziness headache seizure Psychiatric: Depression and anxiety Sleep : Obstructive sleep apnea has not worn CPAP in quite some time   ALLERGIES: No Known Allergies  HOME MEDICATIONS: Outpatient Prescriptions Prior to Visit  Medication Sig Dispense Refill  . ACCU-CHEK AVIVA PLUS test strip TEST UP TO 4 TIMES A DAY. 125 each 3  . amLODipine (NORVASC) 10 MG tablet Take 10 mg by mouth 2 (two) times daily.    Marland Kitchen aspirin 81 MG EC tablet Take 81 mg by mouth daily.      Marland Kitchen atorvastatin (LIPITOR) 80 MG tablet TAKE 1 TABLET DAILY. 30 tablet 5  . benazepril (LOTENSIN) 20 MG tablet Take 20 mg by mouth daily.    . DULoxetine (CYMBALTA) 60 MG capsule Take 60 mg by mouth 2 (two) times daily.     Marland Kitchen EASY TOUCH INSULIN SYRINGE 31G X 5/16" 0.5 ML MISC USE AS DIRECTED TWICE DAILY. 50 each 5  . fenofibrate (TRICOR) 145 MG tablet Take 145 mg by mouth daily.      Marland Kitchen FLUoxetine (PROZAC) 20 MG capsule Take 20 mg by mouth daily.    . furosemide (LASIX) 80 MG tablet Take 40-80 mg by mouth 2 (two) times daily. Takes one tablet in the morning and one-half tablet in the evening    . gabapentin (NEURONTIN) 300 MG capsule Take 300 mg by mouth 3 (three) times daily.    Marland Kitchen HUMULIN R 500 UNIT/ML SOLN injection AS DIRECTED UP TO 35 UNITS 3 TIMES DAILY. (Patient  taking differently: TAKE AS DIRECTED BASED ON BLOOD SUGAR LEVELS. TAKES 16 UNITS IN THE MORNING, 18 UNITS IN THE AFTERNOON, 24 UNITS IN THE EVENING, AND 14 UNITS AT BEDTIME) 20 mL 0  . HYDROcodone-acetaminophen (NORCO) 10-325 MG per tablet Take 1 tablet by mouth every 6 (six) hours as needed for moderate pain or severe pain.    Marland Kitchen insulin glargine (LANTUS) 100 UNIT/ML injection Inject 70 Units into the skin 2 (two) times daily.    . Liraglutide (VICTOZA) 18 MG/3ML SOPN INJECT 1.8 MG SUBCUTANEOUSLY ONCE DAILY. (Patient taking differently: Inject into the skin daily. ) 9 mL 3  . Omega-3 Fatty Acids (FISH OIL) 1200 MG CAPS Take 1 capsule by mouth daily.    . potassium chloride (K-DUR) 10 MEQ tablet TAKE 2 TABLETS BY MOUTH ONCE DAILY. 60 tablet 3  . tiZANidine (ZANAFLEX) 4 MG tablet Take 4 mg by mouth 2 (  two) times daily.    . Vitamin D, Ergocalciferol, (DRISDOL) 50000 UNITS CAPS capsule TAKE 1 CAPSULE TWICE A WEEK. 8 capsule 3  . midodrine (PROAMATINE) 5 MG tablet Take 5 mg by mouth 2 (two) times daily with a meal. Reported on 08/27/2015     No facility-administered medications prior to visit.    PAST MEDICAL HISTORY: Past Medical History  Diagnosis Date  . Sleep apnea     Dr. Brandon Melnick  . Venous insufficiency   . Proteinuria   . Obesity   . Type 2 diabetes mellitus ([place])   . Essential hypertension   . Precordial pain June 2011    Nuclear stress; no ischemia; EF 60%  . Hyperlipidemia   . Insomnia   . History of stroke     PAST SURGICAL HISTORY: Past Surgical History  Procedure Laterality Date  . 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 Total knee arthroplasty Left 2013    FAMILY HISTORY: Family History  Problem Relation Age of Onset  . Heart attack Mother 5  . Breast cancer Mother   . Stroke Mother   . Heart attack Father 6  . Breast cancer Sister   . Colon cancer Sister     SOCIAL HISTORY: Social History   Social History  .  Marital Status: Single    Spouse Name: N/A  . Number of Children: 0  . Years of Education: GED   Occupational History  . Disabled    Social History Main Topics  . Smoking status: Former Smoker    Types: Cigarettes  . Smokeless tobacco: Never Used     Comment: 11/27/14 "quit smoking years ago"  . Alcohol Use: No  . Drug Use: No  . Sexual Activity: No   Other Topics Concern  . Not on file   Social History Narrative     PHYSICAL EXAM  Filed Vitals:   08/27/15 1351 08/27/15 1402 08/27/15 1421  BP: 125/72seated 109/67standing   Pulse: 78 88 76  Height: 5\' 9"  (1.753 m)    Weight: 305 lb 12.8 oz (138.71 kg)     Body mass index is 45.14 kg/(m^2).  Generalized: Well developed, morbidly obese male in no acute distress  Head: normocephalic and atraumatic,. Oropharynx benign Mallopatti4 Neck: Supple, no carotid bruits neck circumference  17 Cardiac: Regular rate rhythm, no murmur  Musculoskeletal: No deformity   Neurological examination   Mentation: Alert oriented to time, place, history taking. Attention span and concentration appropriate. Recent and remote memory intact.  Follows all commands speech and language fluent. ESS 13, FFS 63  Cranial nerve II-XII: Fundoscopic exam reveals sharp disc margins.Pupils were equal round reactive to light extraocular movements were full, visual field were full on confrontational test. Facial sensation and strength were normal. hearing was intact to finger rubbing bilaterally. Uvula tongue midline. head turning and shoulder shrug were normal and symmetric.Tongue protrusion into cheek strength was normal. Motor: normal bulk and tone, full strength in the BUE, BLE, fine finger movements normal, no pronator drift. No focal weakness Sensory: normal and symmetric to light touch, pinprick, but   Vibration, proprioception diminished below the ankle Coordination: finger-nose-finger, heel-to-shin bilaterally, no dysmetria Reflexes: Brachioradialis 2/2,  biceps 2/2, triceps 2/2, patellar 2/2, Achilles 2/2, plantar responses were flexor bilaterally. Gait and Station: Rising up from seated position without assistance, wide based  stance,  moderate stride, good arm swing, smooth turning, able to perform tiptoe, and heel  walking without difficulty. Tandem gait is steady. No assistive device  DIAGNOSTIC DATA (LABS, IMAGING, TESTING) -  Lab Results  Component Value Date   HGBA1C 12.1 08/13/2015  Recent BMP drawn 08/13/2015 with glucose 235 BUN 62 Creatinine 3.4 Potassium 5.5  triglycerides 355  HDL 32  LDL 71   ASSESSMENT AND PLAN PMH of hypertension, hyperlipidemia, uncontrolled diabetes and chronic kidney disease followed up in clinic. Previous spells, almost all related to positioning changes from sitting to standing, with prodromal symptoms of syncope and presyncope. Symptoms relieved when sitting down. Orthostatic vitals continue to trend down when standing.  These spells are most consistent with syncope and presyncope, most likely due to orthostatic hypotension.  He had uncontrolled diabetes, his A1c last checked was 12.1 indicating poorly controlled DM. He had diabetic neuropathy with autonomic dysfunction, which explains his orthostatic hypotension as well as impotance, and finger and toe numbness / tingling. His midodrine was increased by Dr. Dwyane Dee but patient has not started this.  Recent hospital admission to [facility] in [place]  after becoming unresponsive with loss of consciousness incontinence of urine chewing his tongue, this was witnessed by his daughter . He said he has seizures as a child but none as an adult. He had 2 seizure events I do not have access to those records. He did not have MRI or EEG. He still has multiple stroke risk factors including hypertension, hyperlipidemia, diabetes, stroke workup showed negative CUS. EEG negative in 2015. . The patient is a current patient of Dr. Erlinda Hong who is out of the office today . This note is  sent to the work in doctor.    PLAN Sleep study for excessive daytime drowsiness and fatigue previous sleep study positive for OSA not currently being treated MRI of the brain without (Cr. 3.4) due to recent seizure EEG new onset seizure Continue ASA and lipitor for stroke prevention Continue follow up with Dr. Dwyane Dee for DM Increase Midrodrine to 10mg  Three times daily this will help with dizziness and orthostatic blood pressure Follow up with your primary care physician for stroke risk factor modification. Recommend maintain blood pressure goal <130/80,  diabetes with hemoglobin A1c goal below 7.0%  lipids with LDL cholesterol goal below 80 mg/dL.  check BP frequently and glucose at home  Additional questions answered for family Vst time 1 hour Follow-up with Dr. Erlinda Hong in 1 month  Dennie Bible, [facility], [facility], [facility] [address] [place], [place] [address] [phone]

## 2015-08-28 NOTE — Progress Notes (Signed)
I reviewed note and agree with plan.   Penni Bombard, MD [id number], 0000000 PM Certified in Neurology, Neurophysiology and Neuroimaging  [facility] [address] [place], [place] [address] [phone]

## 2015-09-02 ENCOUNTER — Ambulatory Visit: Payer: Medicaid Other | Admitting: Neurology

## 2015-09-02 ENCOUNTER — Ambulatory Visit: Payer: Medicaid Other | Admitting: Endocrinology

## 2015-09-03 ENCOUNTER — Emergency Department (HOSPITAL_COMMUNITY): Payer: Medicaid Other

## 2015-09-03 ENCOUNTER — Encounter (HOSPITAL_COMMUNITY): Payer: Self-pay | Admitting: Emergency Medicine

## 2015-09-03 ENCOUNTER — Emergency Department (HOSPITAL_COMMUNITY)
Admission: EM | Admit: 2015-09-03 | Discharge: 2015-09-03 | Disposition: A | Discharge status: Home / Self Care | Payer: Medicaid Other | Attending: Emergency Medicine | Admitting: Emergency Medicine

## 2015-09-03 DIAGNOSIS — I951 Orthostatic hypotension: Secondary | ICD-10-CM | POA: Diagnosis not present

## 2015-09-03 DIAGNOSIS — W1839XA Other fall on same level, initial encounter: Secondary | ICD-10-CM | POA: Diagnosis not present

## 2015-09-03 DIAGNOSIS — E669 Obesity, unspecified: Secondary | ICD-10-CM | POA: Insufficient documentation

## 2015-09-03 DIAGNOSIS — E785 Hyperlipidemia, unspecified: Secondary | ICD-10-CM | POA: Insufficient documentation

## 2015-09-03 DIAGNOSIS — Y92000 Kitchen of unspecified non-institutional (private) residence as  the place of occurrence of the external cause: Secondary | ICD-10-CM | POA: Insufficient documentation

## 2015-09-03 DIAGNOSIS — E1143 Type 2 diabetes mellitus with diabetic autonomic (poly)neuropathy: Secondary | ICD-10-CM | POA: Diagnosis not present

## 2015-09-03 DIAGNOSIS — Z043 Encounter for examination and observation following other accident: Secondary | ICD-10-CM | POA: Diagnosis not present

## 2015-09-03 DIAGNOSIS — Z79899 Other long term (current) drug therapy: Secondary | ICD-10-CM | POA: Insufficient documentation

## 2015-09-03 DIAGNOSIS — N184 Chronic kidney disease, stage 4 (severe): Secondary | ICD-10-CM | POA: Diagnosis not present

## 2015-09-03 DIAGNOSIS — Z8673 Personal history of transient ischemic attack (TIA), and cerebral infarction without residual deficits: Secondary | ICD-10-CM | POA: Insufficient documentation

## 2015-09-03 DIAGNOSIS — Y9301 Activity, walking, marching and hiking: Secondary | ICD-10-CM | POA: Insufficient documentation

## 2015-09-03 DIAGNOSIS — R05 Cough: Secondary | ICD-10-CM | POA: Insufficient documentation

## 2015-09-03 DIAGNOSIS — M6281 Muscle weakness (generalized): Secondary | ICD-10-CM | POA: Diagnosis present

## 2015-09-03 DIAGNOSIS — I129 Hypertensive chronic kidney disease with stage 1 through stage 4 chronic kidney disease, or unspecified chronic kidney disease: Secondary | ICD-10-CM | POA: Insufficient documentation

## 2015-09-03 DIAGNOSIS — Z87891 Personal history of nicotine dependence: Secondary | ICD-10-CM | POA: Insufficient documentation

## 2015-09-03 DIAGNOSIS — Y998 Other external cause status: Secondary | ICD-10-CM | POA: Diagnosis not present

## 2015-09-03 DIAGNOSIS — Z8669 Personal history of other diseases of the nervous system and sense organs: Secondary | ICD-10-CM | POA: Insufficient documentation

## 2015-09-03 DIAGNOSIS — Z794 Long term (current) use of insulin: Secondary | ICD-10-CM | POA: Insufficient documentation

## 2015-09-03 LAB — CBC WITH DIFFERENTIAL/PLATELET
BASOS PCT: 1 %
Basophils Absolute: 0.1 10*3/uL (ref 0.0–0.1)
Eosinophils Absolute: 0.2 10*3/uL (ref 0.0–0.7)
Eosinophils Relative: 2 %
HEMATOCRIT: 38 % — AB (ref 39.0–52.0)
HEMOGLOBIN: 12.7 g/dL — AB (ref 13.0–17.0)
LYMPHS ABS: 2.5 10*3/uL (ref 0.7–4.0)
Lymphocytes Relative: 36 %
MCH: 30.2 pg (ref 26.0–34.0)
MCHC: 33.4 g/dL (ref 30.0–36.0)
MCV: 90.3 fL (ref 78.0–100.0)
MONO ABS: 0.7 10*3/uL (ref 0.1–1.0)
MONOS PCT: 10 %
NEUTROS ABS: 3.5 10*3/uL (ref 1.7–7.7)
Neutrophils Relative %: 51 %
Platelets: 393 10*3/uL (ref 150–400)
RBC: 4.21 MIL/uL — AB (ref 4.22–5.81)
RDW: 12.6 % (ref 11.5–15.5)
WBC: 6.9 10*3/uL (ref 4.0–10.5)

## 2015-09-03 LAB — COMPREHENSIVE METABOLIC PANEL
ALK PHOS: 68 U/L (ref 38–126)
ALT: 26 U/L (ref 17–63)
AST: 27 U/L (ref 15–41)
Albumin: 4.1 g/dL (ref 3.5–5.0)
Anion gap: 11 (ref 5–15)
BILIRUBIN TOTAL: 0.5 mg/dL (ref 0.3–1.2)
BUN: 49 mg/dL — AB (ref 6–20)
CO2: 26 mmol/L (ref 22–32)
Calcium: 9.7 mg/dL (ref 8.9–10.3)
Chloride: 98 mmol/L — ABNORMAL LOW (ref 101–111)
Creatinine, Ser: 3.65 mg/dL — ABNORMAL HIGH (ref 0.61–1.24)
GFR calc Af Amer: 20 mL/min — ABNORMAL LOW (ref >60)
GFR, EST NON AFRICAN AMERICAN: 17 mL/min — AB (ref >60)
GLUCOSE: 378 mg/dL — AB (ref 65–99)
Potassium: 4.5 mmol/L (ref 3.5–5.1)
Sodium: 135 mmol/L (ref 135–145)
Total Protein: 8.1 g/dL (ref 6.5–8.1)

## 2015-09-03 LAB — URINALYSIS, ROUTINE W REFLEX MICROSCOPIC
Bilirubin Urine: NEGATIVE
Glucose, UA: 500 mg/dL — AB
Hgb urine dipstick: NEGATIVE
Ketones, ur: NEGATIVE mg/dL
LEUKOCYTES UA: NEGATIVE
NITRITE: NEGATIVE
PH: 5 (ref 5.0–8.0)
Protein, ur: NEGATIVE mg/dL
SPECIFIC GRAVITY, URINE: 1.02 (ref 1.005–1.030)

## 2015-09-03 LAB — LACTIC ACID, PLASMA
LACTIC ACID, VENOUS: 1.2 mmol/L (ref 0.5–2.0)
Lactic Acid, Venous: 2.2 mmol/L (ref 0.5–2.0)

## 2015-09-03 MED ORDER — MIDODRINE HCL 5 MG PO TABS
10.0000 mg | ORAL_TABLET | Freq: Once | ORAL | Status: AC
  Administered 2015-09-03: 10 mg via ORAL
  Filled 2015-09-03: qty 2

## 2015-09-03 MED ORDER — SODIUM CHLORIDE 0.9 % IV BOLUS (SEPSIS)
1000.0000 mL | Freq: Once | INTRAVENOUS | Status: AC
  Administered 2015-09-03: 1000 mL via INTRAVENOUS

## 2015-09-03 NOTE — ED Notes (Signed)
CRITICAL VALUE ALERT  Critical value received:  Lactic Acid 2.2   Date of notification:  09/03/15  Time of notification:  1108  Critical value read back:Yes.    Nurse who received alert:  Charmayne Sheer, RN ; Primary nurse Jason Nest made aware.  MD notified (1st page):  [place]

## 2015-09-03 NOTE — ED Notes (Addendum)
PT c/o increased bilateral lower leg weakness worsening this am and states he was walking today and fell to the floor x4 times this morning. PT states chronic lower back pain and multiple falls d/t leg weakness. PT also c/o cough and weakness over a week.

## 2015-09-03 NOTE — ED Provider Notes (Signed)
CSN: [id number]     Arrival date & time 09/03/15  1019 History  By signing my name below, I, Hansel Feinstein, attest that this documentation has been prepared under the direction and in the presence of Merrily Pew, MD. Electronically Signed: Hansel Feinstein, ED Scribe. 09/03/2015. 10:36 AM.    Chief Complaint  Patient presents with  . Weakness   The history is provided by the patient and the EMS personnel. No language interpreter was used.    HPI Comments: Ethan Campbell is a 55 y.o. male brought in by EMS with h/o DM, diabetic neuropathy, epilepsy who presents to the Emergency Department after multiple falls this morning due to increased lower extremity weakness. Pt states he was walking to his kitchen when his legs felt weak and he fell to the floor. Per EMS, pt got up without any help from his first fall and went to sit down, but fell again. No LOC or head injury. No recent weight gain. Pt has h/o frequent falls for 4 years. He states he falls approximately every 3-4 months and has no residual weakness in between falls. Last fall was one month ago and pt was evaluated at [facility], but received no diagnosis. He denies alcohol, tobacco or drug use. Pt also notes he has had a cough for over a month. He states his blood pressure tends to be around 100/80. Last stress test and cardiac [place] were years ago. He denies fever, dysuria, decreased urination, CP, increased back pain from base line, abdominal pain, visual disturbance, syncope, rash, melena, hematochezia, skin color changes.  Past Medical History  Diagnosis Date  . Sleep apnea     Dr. Brandon Melnick  . Venous insufficiency   . Proteinuria   . Obesity   . Type 2 diabetes mellitus ([place])   . Essential hypertension   . Precordial pain June 2011    Nuclear stress; no ischemia; EF 60%  . Hyperlipidemia   . Insomnia   . History of stroke    Past Surgical History  Procedure Laterality Date  . Resection bone tumor femur  1980's    Left femur, [facility] at  [facility] with bone graft  . Total knee arthroplasty Left 2013   Family History  Problem Relation Age of Onset  . Heart attack Mother 62  . Breast cancer Mother   . Stroke Mother   . Heart attack Father 27  . Breast cancer Sister   . Colon cancer Sister    Social History  Substance Use Topics  . Smoking status: Former Smoker    Types: Cigarettes  . Smokeless tobacco: Never Used     Comment: 11/27/14 "quit smoking years ago"  . Alcohol Use: No    Review of Systems  Constitutional: Negative for fever.  Eyes: Negative for visual disturbance.  Respiratory: Positive for cough.   Cardiovascular: Negative for chest pain.  Gastrointestinal: Negative for abdominal pain and blood in stool.  Genitourinary: Negative for dysuria, decreased urine volume and difficulty urinating.  Musculoskeletal: Negative for back pain.  Skin: Negative for color change and rash.  Neurological: Positive for weakness. Negative for syncope.  All other systems reviewed and are negative.  Allergies  Review of patient's allergies indicates no known allergies.  Home Medications   Prior to Admission medications   Medication Sig Start Date End Date Taking? Authorizing Provider  amLODipine (NORVASC) 10 MG tablet Take 10 mg by mouth daily.    Yes Historical Provider, MD  atorvastatin (LIPITOR) 80 MG tablet TAKE  1 TABLET DAILY. 04/29/14  Yes Elayne Snare, MD  benazepril (LOTENSIN) 20 MG tablet Take 20 mg by mouth daily.   Yes Historical Provider, MD  EASY TOUCH INSULIN SYRINGE 31G X 5/16" 0.5 ML MISC USE AS DIRECTED TWICE DAILY. 06/03/15  Yes Elayne Snare, MD  fenofibrate (TRICOR) 145 MG tablet Take 145 mg by mouth daily.    Yes Historical Provider, MD  FLUoxetine (PROZAC) 20 MG capsule Take 20 mg by mouth daily.   Yes Historical Provider, MD  furosemide (LASIX) 80 MG tablet Take 40-80 mg by mouth 2 (two) times daily. Takes one tablet in the morning and one-half tablet in the evening   Yes Historical Provider, MD  HUMULIN R  500 UNIT/ML SOLN injection AS DIRECTED UP TO 35 UNITS 3 TIMES DAILY. Patient taking differently: Take 10 units on waking even if not eating. Take 18 units with first meal and 24 units at supper, 15 units in the evening and 15 units at bedtime 09/11/14  Yes Elayne Snare, MD  HYDROcodone-acetaminophen (NORCO) 10-325 MG per tablet Take 1 tablet by mouth every 6 (six) hours as needed for moderate pain or severe pain.   Yes Historical Provider, MD  insulin glargine (LANTUS) 100 UNIT/ML injection Inject 60 Units into the skin 2 (two) times daily.    Yes Historical Provider, MD  Liraglutide (VICTOZA) 18 MG/3ML SOPN INJECT 1.8 MG SUBCUTANEOUSLY ONCE DAILY. Patient taking differently: Inject 1.8 mg into the skin daily.  02/18/15  Yes Elayne Snare, MD  midodrine (PROAMATINE) 5 MG tablet Take 10 mg by mouth 3 (three) times daily with meals. Reported on 08/27/2015   Yes Historical Provider, MD  potassium chloride (K-DUR) 10 MEQ tablet TAKE 2 TABLETS BY MOUTH ONCE DAILY. 05/19/15  Yes Elayne Snare, MD  tiZANidine (ZANAFLEX) 4 MG tablet Take 4 mg by mouth 2 (two) times daily.   Yes Historical Provider, MD  Vitamin D, Ergocalciferol, (DRISDOL) 50000 UNITS CAPS capsule TAKE 1 CAPSULE TWICE A WEEK. 05/19/15  Yes Elayne Snare, MD  ACCU-CHEK AVIVA PLUS test strip TEST UP TO 4 TIMES A DAY. 01/05/15   Elayne Snare, MD   BP 107/65 mmHg  Pulse 78  Temp(Src) 98.2 F (36.8 C) (Oral)  Resp 16  Ht 5\' 9"  (1.753 m)  Wt 305 lb (138.347 kg)  BMI 45.02 kg/m2  SpO2 97% Physical Exam  Constitutional: He is oriented to person, place, and time. He appears well-developed and well-nourished.  HENT:  Head: Normocephalic and atraumatic.  Mouth/Throat: Oropharynx is clear and moist. No oropharyngeal exudate.  Eyes: EOM are normal. Pupils are equal, round, and reactive to light.  Eyes tracking normally. Pale conjunctiva.   Neck: Normal range of motion. Neck supple.  Cardiovascular: Normal rate and regular rhythm.  Exam reveals no gallop and no  friction rub.   No murmur heard. Decreased distant heart sounds. No obvious murmurs, rubs or gallops.   Pulmonary/Chest: Effort normal and breath sounds normal. No respiratory distress. He has no wheezes. He has no rales.  Abdominal: Soft. Bowel sounds are normal. He exhibits no distension and no mass. There is no tenderness. There is no rebound and no guarding.  Musculoskeletal: Normal range of motion.  Neurological: He is alert and oriented to person, place, and time. No cranial nerve deficit.  Normal strength, but decreased sensation in bilateral lower extremities. Strength and sensation otherwise normal in bilateral upper and lower extremities. Normal finger to nose testing. No facial droop. Cranial nerves 2-12 intact.    Skin:  Skin is warm and dry. No rash noted.  Psychiatric: He has a normal mood and affect. His behavior is normal.  Nursing note and vitals reviewed.   ED Course  Procedures (including critical care time) DIAGNOSTIC STUDIES: Oxygen Saturation is 97% on RA, normal by my interpretation.    COORDINATION OF CARE: 10:24 AM Discussed treatment plan with pt at bedside and pt agreed to plan.   Labs Review Labs Reviewed  CBC WITH DIFFERENTIAL/PLATELET - Abnormal; Notable for the following:    RBC 4.21 (*)    Hemoglobin 12.7 (*)    HCT 38.0 (*)    All other components within normal limits  COMPREHENSIVE METABOLIC PANEL - Abnormal; Notable for the following:    Chloride 98 (*)    Glucose, Bld 378 (*)    BUN 49 (*)    Creatinine, Ser 3.65 (*)    GFR calc non Af Amer 17 (*)    GFR calc Af Amer 20 (*)    All other components within normal limits  LACTIC ACID, PLASMA - Abnormal; Notable for the following:    Lactic Acid, Venous 2.2 (*)    All other components within normal limits  URINALYSIS, ROUTINE W REFLEX MICROSCOPIC (NOT AT [facility]) - Abnormal; Notable for the following:    Glucose, UA 500 (*)    All other components within normal limits  LACTIC ACID, PLASMA  LACTIC  ACID, PLASMA    Imaging Review Dg Chest 2 View  09/03/2015  CLINICAL DATA:  Increasing weakness EXAM: CHEST  2 VIEW COMPARISON:  07/27/2015 FINDINGS: The heart size and mediastinal contours are within normal limits. Both lungs are clear. The visualized skeletal structures are unremarkable. IMPRESSION: No active cardiopulmonary disease. Electronically Signed   By: Inez Catalina M.D.   On: 09/03/2015 11:19   I have personally reviewed and evaluated these images and lab results as part of my medical decision-making.   EKG Interpretation   Date/Time:  Thursday September 03 2015 10:31:32 EST Ventricular Rate:  66 PR Interval:  156 QRS Duration: 113 QT Interval:  387 QTC Calculation: 405 R Axis:   -43 Text Interpretation:  Sinus rhythm Borderline IVCD with LAD Low voltage,  precordial leads Confirmed by [facility] MD, Corene Cornea [phone]) on 09/03/2015  10:34:34 AM      MDM   Final diagnoses:  Orthostatic hypotension  Diabetic autonomic neuropathy associated with type 2 diabetes mellitus (HCC)  CKD (chronic kidney disease), stage 4 (severe) ([place])    55 year old male presents to the emergency department for bilateral lower extremity weakness similar to multiple episodes before. He has fallen approximate 4 times a day because of this. He states that his legs are just weak and then he collapses to the ground. Has not had his head is not hurting anywhere. On initial evaluation patient with hypotension however on further review the records she has a history of orthostatic hypotension and autonomic dysfunction is on Midrin for this. He is given his many drain and a liter of fluids which increased his blood pressure lactic acid was slightly elevated but improved with both of these. Patient got a different walker to use to try to help the falls. Multiple appropriate blood pressures in a ride out the patient has sepsis or other neurologic causes of this is chronic condition. He is stable to discharge and follow-up  with his specialist as already scheduled.  I personally performed the services described in this documentation, which was scribed in my presence. The recorded information has been  reviewed and is accurate.    Merrily Pew, MD 09/03/15 1525

## 2015-09-03 NOTE — ED Notes (Signed)
[facility] staff brought pt a walker.

## 2015-09-08 ENCOUNTER — Encounter: Payer: Medicaid Other | Attending: Endocrinology | Admitting: Nutrition

## 2015-09-08 DIAGNOSIS — E1065 Type 1 diabetes mellitus with hyperglycemia: Secondary | ICD-10-CM | POA: Diagnosis not present

## 2015-09-08 DIAGNOSIS — Z794 Long term (current) use of insulin: Secondary | ICD-10-CM

## 2015-09-08 DIAGNOSIS — E1101 Type 2 diabetes mellitus with hyperosmolarity with coma: Secondary | ICD-10-CM

## 2015-09-09 ENCOUNTER — Encounter: Payer: Self-pay | Admitting: *Deleted

## 2015-09-09 ENCOUNTER — Telehealth: Payer: Self-pay | Admitting: Endocrinology

## 2015-09-09 ENCOUNTER — Other Ambulatory Visit: Payer: Self-pay | Admitting: *Deleted

## 2015-09-09 MED ORDER — V-GO 30 KIT: PACK | Status: DC

## 2015-09-09 NOTE — Progress Notes (Signed)
Ethan Campbell was shown how to fill the V-go 30 with U-500 insulin.  He redemonstrated this without any difficulty.  He did not attach this, because he took his lantus last night and again this AM.  He will attach this in the AM.  He was told to take no Lantus tonight and in the AM, and to stop all of it for now.  He reported good understanding of this. He was also shown how to attach the V-go and how to give the boluses.  He was told that each button press delivers 2u (10 of the U500) insulin, and he re verbalized this correctly.   He was told to test his blood sugars before meals, and at HS and agreed to do this. He was given a V-Go 30 starter kit with a 800 telephone number to call for assistance.   He had no final questions.

## 2015-09-09 NOTE — Telephone Encounter (Signed)
Patient just checked it at 3:12 it was 111, this morning it was 400 These are the only two readings he has.

## 2015-09-09 NOTE — Telephone Encounter (Signed)
Need to see what his sugars are before 5 PM today

## 2015-09-09 NOTE — Telephone Encounter (Signed)
Pt stated he was returning your phone call.

## 2015-09-09 NOTE — Telephone Encounter (Signed)
Said his FBS today before starting the V-go was 400. He had no difficulty attaching the V-Go this AM, or taking his insulin before breakfast.   He took no Lantus last night or this AM, and he has not  Tested any more today.  Told him to call the office before 5PM if blood sugars do not come down, or drop too low.   Reminded him of his appt. With Dr. Dwyane Dee next week.  He said that his insurance will pay all but $10.00 of the cost each month.

## 2015-09-09 NOTE — Patient Instructions (Signed)
Fill a new V-go 30 every morning with 66u of U-500 R insulin Give 14 before first meal, and 20u before second meal. Test blood sugars before meals and at bedtime. Call if blood sugars go high, or drop low.

## 2015-09-11 ENCOUNTER — Ambulatory Visit (INDEPENDENT_AMBULATORY_CARE_PROVIDER_SITE_OTHER): Payer: Medicaid Other | Admitting: Endocrinology

## 2015-09-11 ENCOUNTER — Encounter: Payer: Self-pay | Admitting: Endocrinology

## 2015-09-11 VITALS — BP 116/68 | HR 79 | Temp 98.3°F | Resp 16 | Ht 69.0 in | Wt 312.4 lb

## 2015-09-11 DIAGNOSIS — Z794 Long term (current) use of insulin: Secondary | ICD-10-CM

## 2015-09-11 DIAGNOSIS — E1165 Type 2 diabetes mellitus with hyperglycemia: Secondary | ICD-10-CM

## 2015-09-11 NOTE — Progress Notes (Signed)
Patient ID: Ethan Campbell, male   DOB: 1960-09-27, 55 y.o.   MRN: [id number]   Reason for Appointment: Diabetes follow-up   History of Present Illness   Diagnosis: Type 2 DIABETES MELITUS, date of diagnosis: 2000    Previous history: he has been on insulin for several years with consistently poor control Has been requiring large doses of insulin for his diabetes but A1c has been persistently high Blood sugars did not improve significantly even with trying Byetta and Victoza In 2014 he was switched from NovoLog to U-500 insulin but not clear if he has had improvement in control except with fasting readings His prior A1c was 14.0 in 5/14 and he had educational discussions with diabetes educator and dietitian in 6/14   Recent history:  Insulin regimen:    V-go pump, 30 units basal. Boluses 8 units per meal    He was started on the V-go pump because of marked variability in blood sugars and overall high readings persistently  he has had no difficulty using the pump are doing the boluses   Today is the third day of the pump.   Glucose readings as follows:   fasting readings 53 and 47    glucose in the afternoon on the first day 111 and 135 and yesterday 228  Bedtime reading last night 92 and previous night 201  Glucose today 295 after breakfast and 139 9 the office  Mealtimes: 12 noon, 6 pm  Proper timing of medications in relation to meals: Yes, usually 30 minutes before eating .    Last dietitian visit: 6/14 and last CDE visit in 4/16       Monitors blood glucose:  1-2 times a day.    Glucometer:  Accu-Chek     Blood Glucose readings   Physical activity: exercise: Unable to recently   Wt Readings from Last 3 Encounters:  09/11/15 312 lb 6.4 oz (141.704 kg)  09/03/15 305 lb (138.347 kg)  08/27/15 305 lb 12.8 oz (138.71 kg)    LABS:   Lab Results  Component Value Date   HGBA1C 12.1 08/13/2015   HGBA1C 10.7 05/27/2015   HGBA1C 12 02/18/2015   Lab  Results  Component Value Date   MICROALBUR 7.0* 12/30/2013   LDLCALC 83 10/13/2014   CREATININE 3.65* 09/03/2015        Medication List       This list is accurate as of: 09/11/15  8:51 PM.  Always use your most recent med list.               ACCU-CHEK AVIVA PLUS test strip  Generic drug:  glucose blood  TEST UP TO 4 TIMES A DAY.     amLODipine 10 MG tablet  Commonly known as:  NORVASC  Take 10 mg by mouth daily.     atorvastatin 80 MG tablet  Commonly known as:  LIPITOR  TAKE 1 TABLET DAILY.     benazepril 20 MG tablet  Commonly known as:  LOTENSIN  Take 20 mg by mouth daily.     EASY TOUCH INSULIN SYRINGE 31G X 5/16" 0.5 ML Misc  Generic drug:  Insulin Syringe-Needle U-100  USE AS DIRECTED TWICE DAILY.     fenofibrate 145 MG tablet  Commonly known as:  TRICOR  Take 145 mg by mouth daily.     FLUoxetine 20 MG capsule  Commonly known as:  PROZAC  Take 20 mg by mouth daily.     furosemide 80 MG  tablet  Commonly known as:  LASIX  Take 40-80 mg by mouth 2 (two) times daily. Takes one tablet in the morning and one-half tablet in the evening     HUMULIN R 500 UNIT/ML injection  Generic drug:  insulin regular human CONCENTRATED  AS DIRECTED UP TO 35 UNITS 3 TIMES DAILY.     HYDROcodone-acetaminophen 10-325 MG tablet  Commonly known as:  NORCO  Take 1 tablet by mouth every 6 (six) hours as needed for moderate pain or severe pain.     insulin glargine 100 UNIT/ML injection  Commonly known as:  LANTUS  Inject 60 Units into the skin 2 (two) times daily. Reported on 09/11/2015     Liraglutide 18 MG/3ML Sopn  Commonly known as:  VICTOZA  INJECT 1.8 MG SUBCUTANEOUSLY ONCE DAILY.     midodrine 5 MG tablet  Commonly known as:  PROAMATINE  Take 10 mg by mouth 3 (three) times daily with meals. Reported on 08/27/2015     potassium chloride 10 MEQ tablet  Commonly known as:  K-DUR  TAKE 2 TABLETS BY MOUTH ONCE DAILY.     tiZANidine 4 MG tablet  Commonly known as:   ZANAFLEX  Take 4 mg by mouth 2 (two) times daily.     V-GO 30 Kit  Use 1 every day dx code E11.65     Vitamin D (Ergocalciferol) 50000 units Caps capsule  Commonly known as:  DRISDOL  TAKE 1 CAPSULE TWICE A WEEK.        Allergies: No Known Allergies  Past Medical History  Diagnosis Date  . Sleep apnea     Dr. Brandon Melnick  . Venous insufficiency   . Proteinuria   . Obesity   . Type 2 diabetes mellitus ([place])   . Essential hypertension   . Precordial pain June 2011    Nuclear stress; no ischemia; EF 60%  . Hyperlipidemia   . Insomnia   . History of stroke     Past Surgical History  Procedure Laterality Date  . 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 Total knee arthroplasty Left 2013    Family History  Problem Relation Age of Onset  . Heart attack Mother 7  . Breast cancer Mother   . Stroke Mother   . Heart attack Father 66  . Breast cancer Sister   . Colon cancer Sister     Social History:  reports that he has quit smoking. His smoking use included Cigarettes. He has never used smokeless tobacco. He reports that he does not drink alcohol or use illicit drugs.  Review of Systems:  Blackout episodes: He has had some episodes and also has had difficulty with orthostatic hypotension Now being treated with midodrine and this was increased to 10 mg 3 times a day on his last visit here He has not had any further lightheadedness or syncope   Lipids: triglycerides have been relatively high despite taking fenofibrate. LDL is below 100, taking Lipitor   Lab Results  Component Value Date   CHOL 161 05/27/2015   HDL 37.10* 05/27/2015   LDLCALC 83 10/13/2014   LDLDIRECT 92.0 05/27/2015   TRIG 228.0* 05/27/2015   CHOLHDL 4 05/27/2015      CKD: His creatinine has been recently higher   Lab Results  Component Value Date   CREATININE 3.65* 09/03/2015   He has been told to have CHF and is taking Lasix along with  potassium  Examination:   BP 116/68 mmHg  Pulse 79  Temp(Src) 98.3 F (36.8 C)  Resp 16  Ht '5\' 9"'  (1.753 m)  Wt 312 lb 6.4 oz (141.704 kg)  BMI 46.11 kg/m2  SpO2 98%  Body mass index is 46.11 kg/(m^2).   Initial blood pressure sitting was 120/70 No edema  ASSESSMENT/ PLAN:   Diabetes type 2 with obesity and poor control  He is here for short-term follow-up of starting the V-go pump As discussed above his blood sugars have been overall better and more predictable He is now starting to get fasting hypoglycemia with the 30 unit basal, otherwise his high sugars only related to rebound from the morning low blood sugars Compared to previous insulin requirement of 160 units of Lantus and 50 units of U-500 insulin is now needing much less insulin and more consistent control  Recommended that he go back to the 20 unit basal and a sample is given He will use only 8 units for boluses as before He will call back is waiting next week again   ORTHOSTATIC hypotension: He is doing much better with midodrine 10 mg 3 times a day   There are no Patient Instructions on file for this visit.      Liz Pinho 09/11/2015, 8:51 PM

## 2015-09-14 ENCOUNTER — Other Ambulatory Visit: Payer: Self-pay | Admitting: *Deleted

## 2015-09-14 ENCOUNTER — Telehealth: Payer: Self-pay | Admitting: Endocrinology

## 2015-09-14 MED ORDER — "INSULIN SYRINGE-NEEDLE U-100 27G X 1/2"" 1 ML MISC": Status: DC

## 2015-09-14 NOTE — Telephone Encounter (Signed)
Pt said he needs 1 inch needles called into [facility] in [place].

## 2015-09-14 NOTE — Telephone Encounter (Signed)
Vaughan Basta,  Do you know what he's talking about?

## 2015-09-17 ENCOUNTER — Ambulatory Visit: Payer: Medicaid Other | Admitting: Endocrinology

## 2015-09-18 ENCOUNTER — Ambulatory Visit: Payer: Medicaid Other | Admitting: Endocrinology

## 2015-09-22 ENCOUNTER — Telehealth: Payer: Self-pay | Admitting: Endocrinology

## 2015-09-22 ENCOUNTER — Telehealth: Payer: Self-pay | Admitting: Nutrition

## 2015-09-22 ENCOUNTER — Ambulatory Visit (INDEPENDENT_AMBULATORY_CARE_PROVIDER_SITE_OTHER): Payer: Medicaid Other | Admitting: Neurology

## 2015-09-22 ENCOUNTER — Telehealth: Payer: Self-pay | Admitting: Nurse Practitioner

## 2015-09-22 DIAGNOSIS — G40909 Epilepsy, unspecified, not intractable, without status epilepticus: Secondary | ICD-10-CM | POA: Diagnosis not present

## 2015-09-22 NOTE — Telephone Encounter (Signed)
-----   Message from Dennie Bible, NP sent at 09/22/2015  4:00 PM EST ----- Please call normal EEG

## 2015-09-22 NOTE — Telephone Encounter (Signed)
Called and spoke to patient   Relayed EEG was normal. Patient understood.

## 2015-09-22 NOTE — Procedures (Signed)
    History:  Ethan Campbell is a 55 year old gentleman with a history of hypertension, dyslipidemia, diabetes, chronic renal insufficiency who has been having episodes of passing out for about one year. The patient is being evaluated for these events.  This is a routine EEG. No skull defects are noted. Medications include Norvasc, Lipitor, Lotensin, Prozac, Lasix, insulin, hydrocodone, Victoza, midodrine, potassium supplementation, tizanidine, and vitamin D.   EEG classification: Normal awake and drowsy  Description of the recording: The background rhythms of this recording consists of a fairly well modulated medium amplitude alpha rhythm of 9 Hz that is reactive to eye opening and closure. As the record progresses, the patient appears to remain in the waking state throughout the recording. Photic stimulation was performed, resulting in a bilateral and symmetric photic driving response. Hyperventilation was also performed, resulting in a minimal buildup of the background rhythm activities without significant slowing seen. Toward the end of the recording, the patient enters the drowsy state with slight symmetric slowing seen. The patient never enters stage II sleep. At no time during the recording does there appear to be evidence of spike or spike wave discharges or evidence of focal slowing. EKG monitor shows no evidence of cardiac rhythm abnormalities with a heart rate of 60.  Impression: This is a normal EEG recording in the waking and drowsy state. No evidence of ictal or interictal discharges are seen.

## 2015-09-22 NOTE — Telephone Encounter (Signed)
Pt said he was returning your phone call, but he said that all he needed was a prescription for VGo 20

## 2015-09-22 NOTE — Telephone Encounter (Signed)
Message left on machine to have patient call either myself (before noon today, or before 5PM to [place], to let us know how blood sugars are doing.

## 2015-09-23 ENCOUNTER — Other Ambulatory Visit: Payer: Self-pay | Admitting: Nurse Practitioner

## 2015-09-23 ENCOUNTER — Other Ambulatory Visit: Payer: Self-pay | Admitting: *Deleted

## 2015-09-23 NOTE — Telephone Encounter (Signed)
Vaughan Basta has he went down to the 20?  He has been on the 30?

## 2015-09-24 ENCOUNTER — Telehealth: Payer: Self-pay | Admitting: Endocrinology

## 2015-09-24 ENCOUNTER — Other Ambulatory Visit: Payer: Self-pay | Admitting: *Deleted

## 2015-09-24 MED ORDER — V-GO 20 KIT: PACK | Status: DC

## 2015-09-24 NOTE — Telephone Encounter (Signed)
Patient called and would like his Rx sent to pharmacy   Rx: Vgo 20  Pharmacy: [facility]   Thank you

## 2015-09-24 NOTE — Telephone Encounter (Signed)
rx sent

## 2015-09-25 ENCOUNTER — Other Ambulatory Visit: Payer: Self-pay | Admitting: Endocrinology

## 2015-09-25 ENCOUNTER — Other Ambulatory Visit: Payer: Self-pay | Admitting: *Deleted

## 2015-09-25 MED ORDER — V-GO 20 KIT: PACK | Status: DC

## 2015-09-25 NOTE — Telephone Encounter (Signed)
Needs PA 

## 2015-09-25 NOTE — Telephone Encounter (Signed)
Please see below and advise.

## 2015-09-25 NOTE — Telephone Encounter (Signed)
Pt states [facility] states they have not received the RX for the VGo. I let him know that we received a confirmation he is going to check with them again

## 2015-09-25 NOTE — Telephone Encounter (Signed)
I called Mr. Brazile to explain that a PA was going to be done for the V-go, he said he spoke with someone from the company that told him they would get it to him through a DME supplier, a PA does not need to be done.

## 2015-09-25 NOTE — Telephone Encounter (Signed)
VGo is not going to be covered please advise [facility] calling [phone]

## 2015-09-26 ENCOUNTER — Ambulatory Visit
Admission: RE | Admit: 2015-09-26 | Discharge: 2015-09-26 | Disposition: A | Discharge status: Home / Self Care | Payer: Medicaid Other | Source: Ambulatory Visit | Attending: Nurse Practitioner | Admitting: Nurse Practitioner

## 2015-09-26 DIAGNOSIS — G40909 Epilepsy, unspecified, not intractable, without status epilepticus: Secondary | ICD-10-CM | POA: Diagnosis not present

## 2015-09-29 ENCOUNTER — Telehealth: Payer: Self-pay | Admitting: Nurse Practitioner

## 2015-09-29 NOTE — Telephone Encounter (Signed)
-----   Message from Dennie Bible, NP sent at 09/29/2015  8:02 AM EST ----- MRI of the brain without acute changes, when compared to 02/27/2014. Please call the patient

## 2015-09-29 NOTE — Telephone Encounter (Signed)
Called and spoke to patient relayed no acute changes MRI of the brain when compared to 02/27/2014/. Patient understood message.

## 2015-09-29 NOTE — Progress Notes (Signed)
Quick Note:  Called and spoke to patient relayed MRI of the brain no acute changes when compared to 02/27/2014. Patient understood message. ______

## 2015-09-30 ENCOUNTER — Encounter (HOSPITAL_COMMUNITY): Payer: Self-pay | Admitting: Cardiology

## 2015-09-30 ENCOUNTER — Emergency Department (HOSPITAL_COMMUNITY): Payer: Medicaid Other

## 2015-09-30 ENCOUNTER — Emergency Department (HOSPITAL_COMMUNITY)
Admission: EM | Admit: 2015-09-30 | Discharge: 2015-09-30 | Disposition: A | Discharge status: Home / Self Care | Payer: Medicaid Other | Attending: Emergency Medicine | Admitting: Emergency Medicine

## 2015-09-30 DIAGNOSIS — R55 Syncope and collapse: Secondary | ICD-10-CM

## 2015-09-30 DIAGNOSIS — Z87891 Personal history of nicotine dependence: Secondary | ICD-10-CM | POA: Insufficient documentation

## 2015-09-30 DIAGNOSIS — Z79899 Other long term (current) drug therapy: Secondary | ICD-10-CM | POA: Insufficient documentation

## 2015-09-30 DIAGNOSIS — Z794 Long term (current) use of insulin: Secondary | ICD-10-CM | POA: Diagnosis not present

## 2015-09-30 DIAGNOSIS — E1165 Type 2 diabetes mellitus with hyperglycemia: Secondary | ICD-10-CM | POA: Diagnosis not present

## 2015-09-30 DIAGNOSIS — E669 Obesity, unspecified: Secondary | ICD-10-CM | POA: Insufficient documentation

## 2015-09-30 DIAGNOSIS — I10 Essential (primary) hypertension: Secondary | ICD-10-CM | POA: Insufficient documentation

## 2015-09-30 DIAGNOSIS — Z8673 Personal history of transient ischemic attack (TIA), and cerebral infarction without residual deficits: Secondary | ICD-10-CM | POA: Insufficient documentation

## 2015-09-30 DIAGNOSIS — E785 Hyperlipidemia, unspecified: Secondary | ICD-10-CM | POA: Diagnosis not present

## 2015-09-30 LAB — CBC WITH DIFFERENTIAL/PLATELET
BASOS ABS: 0 10*3/uL (ref 0.0–0.1)
BASOS PCT: 0 %
EOS ABS: 0.1 10*3/uL (ref 0.0–0.7)
Eosinophils Relative: 1 %
HCT: 35 % — ABNORMAL LOW (ref 39.0–52.0)
Hemoglobin: 11.8 g/dL — ABNORMAL LOW (ref 13.0–17.0)
LYMPHS PCT: 37 %
Lymphs Abs: 2.3 10*3/uL (ref 0.7–4.0)
MCH: 30.4 pg (ref 26.0–34.0)
MCHC: 33.7 g/dL (ref 30.0–36.0)
MCV: 90.2 fL (ref 78.0–100.0)
Monocytes Absolute: 0.4 10*3/uL (ref 0.1–1.0)
Monocytes Relative: 7 %
Neutro Abs: 3.3 10*3/uL (ref 1.7–7.7)
Neutrophils Relative %: 55 %
PLATELETS: 317 10*3/uL (ref 150–400)
RBC: 3.88 MIL/uL — AB (ref 4.22–5.81)
RDW: 13.2 % (ref 11.5–15.5)
WBC: 6.1 10*3/uL (ref 4.0–10.5)

## 2015-09-30 LAB — COMPREHENSIVE METABOLIC PANEL
ALBUMIN: 3.9 g/dL (ref 3.5–5.0)
ALT: 31 U/L (ref 17–63)
AST: 35 U/L (ref 15–41)
Alkaline Phosphatase: 54 U/L (ref 38–126)
Anion gap: 10 (ref 5–15)
BUN: 55 mg/dL — AB (ref 6–20)
CHLORIDE: 101 mmol/L (ref 101–111)
CO2: 25 mmol/L (ref 22–32)
CREATININE: 3.19 mg/dL — AB (ref 0.61–1.24)
Calcium: 9.2 mg/dL (ref 8.9–10.3)
GFR calc Af Amer: 24 mL/min — ABNORMAL LOW (ref >60)
GFR calc non Af Amer: 21 mL/min — ABNORMAL LOW (ref >60)
GLUCOSE: 209 mg/dL — AB (ref 65–99)
POTASSIUM: 4.2 mmol/L (ref 3.5–5.1)
SODIUM: 136 mmol/L (ref 135–145)
Total Bilirubin: 0.8 mg/dL (ref 0.3–1.2)
Total Protein: 7.3 g/dL (ref 6.5–8.1)

## 2015-09-30 LAB — I-STAT TROPONIN, ED: Troponin i, poc: 0 ng/mL (ref 0.00–0.08)

## 2015-09-30 LAB — I-STAT CG4 LACTIC ACID, ED: Lactic Acid, Venous: 1.62 mmol/L (ref 0.5–2.0)

## 2015-09-30 MED ORDER — SODIUM CHLORIDE 0.9 % IV BOLUS (SEPSIS)
1000.0000 mL | Freq: Once | INTRAVENOUS | Status: AC
  Administered 2015-09-30: 1000 mL via INTRAVENOUS

## 2015-09-30 NOTE — ED Notes (Addendum)
Syncopal episode.  Blood pressure low with EMS.  500 cc bolus given by EMS.   CBG 288.

## 2015-09-30 NOTE — Discharge Instructions (Signed)
Follow up with your cardiologist next week.   Stop your norvasc

## 2015-09-30 NOTE — ED Provider Notes (Signed)
CSN: [id number]     Arrival date & time 09/30/15  1602 History   First MD Initiated Contact with Patient 09/30/15 1623     Chief Complaint  Patient presents with  . Loss of Consciousness     (Consider location/radiation/quality/duration/timing/severity/associated sxs/prior Treatment) Patient is a 55 y.o. male presenting with syncope. The history is provided by the patient (The patient states he had a syncopal episode today. This is happened to him before and he's been seeing neurologist and cardiologist to try to figure out why).  Loss of Consciousness Episode history:  Single Most recent episode:  Today Progression:  Resolved Chronicity:  New Context: not blood draw   Associated symptoms: no chest pain, no headaches and no seizures     Past Medical History  Diagnosis Date  . Sleep apnea     Dr. Brandon Melnick  . Venous insufficiency   . Proteinuria   . Obesity   . Type 2 diabetes mellitus ([place])   . Essential hypertension   . Precordial pain June 2011    Nuclear stress; no ischemia; EF 60%  . Hyperlipidemia   . Insomnia   . History of stroke    Past Surgical History  Procedure Laterality Date  . 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 Total knee arthroplasty Left 2013   Family History  Problem Relation Age of Onset  . Heart attack Mother 75  . Breast cancer Mother   . Stroke Mother   . Heart attack Father 16  . Breast cancer Sister   . Colon cancer Sister    Social History  Substance Use Topics  . Smoking status: Former Smoker    Types: Cigarettes  . Smokeless tobacco: Never Used     Comment: 11/27/14 "quit smoking years ago"  . Alcohol Use: No    Review of Systems  Constitutional: Negative for appetite change and fatigue.  HENT: Negative for congestion, ear discharge and sinus pressure.   Eyes: Negative for discharge.  Respiratory: Negative for cough.   Cardiovascular: Positive for syncope. Negative for chest pain.   Gastrointestinal: Negative for abdominal pain and diarrhea.  Genitourinary: Negative for frequency and hematuria.  Musculoskeletal: Negative for back pain.  Skin: Negative for rash.  Neurological: Negative for seizures and headaches.  Psychiatric/Behavioral: Negative for hallucinations.      Allergies  Review of patient's allergies indicates no known allergies.  Home Medications   Prior to Admission medications   Medication Sig Start Date End Date Taking? Authorizing Provider  amLODipine (NORVASC) 10 MG tablet Take 10 mg by mouth daily.    Yes Historical Provider, MD  atorvastatin (LIPITOR) 80 MG tablet TAKE 1 TABLET DAILY. 04/29/14  Yes Elayne Snare, MD  benazepril (LOTENSIN) 20 MG tablet Take 20 mg by mouth daily.   Yes Historical Provider, MD  fenofibrate (TRICOR) 145 MG tablet Take 145 mg by mouth daily.    Yes Historical Provider, MD  FLUoxetine (PROZAC) 20 MG capsule Take 20 mg by mouth daily.   Yes Historical Provider, MD  furosemide (LASIX) 80 MG tablet Take 40-80 mg by mouth 2 (two) times daily. Takes one tablet in the morning and one-half tablet in the evening   Yes Historical Provider, MD  HUMULIN R 500 UNIT/ML SOLN injection AS DIRECTED UP TO 35 UNITS 3 TIMES DAILY. Patient taking differently: use max 68 units with V-go pump 09/11/14  Yes Elayne Snare, MD  HYDROcodone-acetaminophen ([place]) 10-325 MG  per tablet Take 1 tablet by mouth every 6 (six) hours as needed for moderate pain or severe pain.   Yes Historical Provider, MD  Liraglutide (VICTOZA) 18 MG/3ML SOPN INJECT 1.8 MG SUBCUTANEOUSLY ONCE DAILY. Patient taking differently: Inject 1.8 mg into the skin daily.  02/18/15  Yes Elayne Snare, MD  midodrine (PROAMATINE) 10 MG tablet Take 10 mg by mouth 3 (three) times daily.   Yes Historical Provider, MD  potassium chloride (K-DUR) 10 MEQ tablet TAKE 2 TABLETS BY MOUTH ONCE DAILY. 09/25/15  Yes Elayne Snare, MD  tiZANidine (ZANAFLEX) 4 MG tablet Take 4 mg by mouth 2 (two) times daily.    Yes Historical Provider, MD  Vitamin D, Ergocalciferol, (DRISDOL) 50000 units CAPS capsule TAKE 1 CAPSULE TWICE A WEEK. 09/25/15  Yes Elayne Snare, MD  ACCU-CHEK AVIVA PLUS test strip TEST UP TO 4 TIMES A DAY. 09/25/15   Elayne Snare, MD  HUMULIN R 500 UNIT/ML injection USE AS DIRECTED, UP TO 35 UNITS 3 TIMES A DAY. Patient not taking: Reported on 09/30/2015 09/25/15   Elayne Snare, MD  Insulin Disposable Pump (V-GO 20) KIT Use one per day 09/25/15   Elayne Snare, MD  Insulin Syringe-Needle U-100 27G X 1/2" 1 ML MISC Use to fill V-go pump daily 09/14/15   Elayne Snare, MD   BP 122/104 mmHg  Pulse 74  Temp(Src) 98 F (36.7 C) (Oral)  Resp 20  Ht _0  (1.753 m)  Wt 315 lb (142.883 kg)  BMI 46.50 kg/m2  SpO2 94% Physical Exam  Constitutional: He is oriented to person, place, and time. He appears well-developed.  HENT:  Head: Normocephalic.  Eyes: Conjunctivae and EOM are normal. No scleral icterus.  Neck: Neck supple. No thyromegaly present.  Cardiovascular: Normal rate and regular rhythm.  Exam reveals no gallop and no friction rub.   No murmur heard. Pulmonary/Chest: No stridor. He has no wheezes. He has no rales. He exhibits no tenderness.  Abdominal: He exhibits no distension. There is no tenderness. There is no rebound.  Musculoskeletal: Normal range of motion. He exhibits no edema.  Lymphadenopathy:    He has no cervical adenopathy.  Neurological: He is oriented to person, place, and time. He exhibits normal muscle tone. Coordination normal.  Skin: No rash noted. No erythema.  Psychiatric: He has a normal mood and affect. His behavior is normal.    ED Course  Procedures (including critical care time) Labs Review Labs Reviewed  CBC WITH DIFFERENTIAL/PLATELET - Abnormal; Notable for the following:    RBC 3.88 (*)    Hemoglobin 11.8 (*)    HCT 35.0 (*)    All other components within normal limits  COMPREHENSIVE METABOLIC PANEL - Abnormal; Notable for the following:    Glucose, Bld 209 (*)     BUN 55 (*)    Creatinine, Ser 3.19 (*)    GFR calc non Af Amer 21 (*)    GFR calc Af Amer 24 (*)    All other components within normal limits  I-STAT TROPOININ, ED  I-STAT CG4 LACTIC ACID, ED  I-STAT CG4 LACTIC ACID, ED    Imaging Review Dg Chest 2 View  09/30/2015  CLINICAL DATA:  Syncope today.  Initial encounter. EXAM: CHEST  2 VIEW COMPARISON:  PA and lateral chest 09/03/2015. Single view of the chest 03/24/2015. FINDINGS: The lungs are clear. Heart size is normal. There is no pneumothorax or pleural effusion. No focal bony abnormality. IMPRESSION: Negative chest. Electronically Signed   By: Marcello Moores  Dalessio M.D.   On: 09/30/2015 18:00   Ct Head Wo Contrast  09/30/2015  CLINICAL DATA:  Syncope and fall today.  Initial encounter. EXAM: CT HEAD WITHOUT CONTRAST TECHNIQUE: Contiguous axial images were obtained from the base of the skull through the vertex without intravenous contrast. COMPARISON:  Brain MRI 02/27/2014. FINDINGS: No evidence of acute intracranial abnormality including hemorrhage, infarct, mass lesion, mass effect, midline shift or abnormal extra-axial fluid collection. Scattered hypoattenuation in the deep white matter structures is noted. The calvarium is intact. Imaged paranasal sinuses and mastoid air cells are clear. IMPRESSION: No acute abnormality. Electronically Signed   By: Inge Rise M.D.   On: 09/30/2015 18:03   I have personally reviewed and evaluated these images and lab results as part of my medical decision-making.   EKG Interpretation   Date/Time:  Wednesday September 30 2015 16:08:03 EST Ventricular Rate:  73 PR Interval:  127 QRS Duration: 111 QT Interval:  374 QTC Calculation: 412 R Axis:   122 Text Interpretation:  Sinus rhythm Right axis deviation Low voltage,  precordial leads Borderline repolarization abnormality Confirmed by Rosemary Mossbarger   MD, Ralphie Lovelady [phone]) on 09/30/2015 7:24:16 PM      MDM   Final diagnoses:  Syncope and collapse     Patient was equal episode labs CT head EKG unremarkable. Patient has congestive heart failure and ran a low blood pressure recently was hospitalized for that. He is on 2 blood pressure medicines and Lasix. Patient was given 2 L of normal saline his blood pressure came up to 155 systolic. Patient is instructed to stop his Norvasc and follow-up with his cardiologist or PCP next week    Milton Ferguson, MD 09/30/15 1935

## 2015-10-01 ENCOUNTER — Ambulatory Visit: Payer: Medicaid Other | Admitting: Endocrinology

## 2015-10-31 ENCOUNTER — Other Ambulatory Visit: Payer: Self-pay | Admitting: Endocrinology

## 2015-11-02 ENCOUNTER — Telehealth: Payer: Self-pay | Admitting: Endocrinology

## 2015-11-02 NOTE — Telephone Encounter (Signed)
Lm for pt to call back to schedule fu and labs same day asap

## 2015-11-20 ENCOUNTER — Other Ambulatory Visit (INDEPENDENT_AMBULATORY_CARE_PROVIDER_SITE_OTHER): Payer: Medicaid Other

## 2015-11-20 ENCOUNTER — Other Ambulatory Visit: Payer: Self-pay | Admitting: *Deleted

## 2015-11-20 DIAGNOSIS — E1165 Type 2 diabetes mellitus with hyperglycemia: Principal | ICD-10-CM

## 2015-11-20 DIAGNOSIS — IMO0001 Reserved for inherently not codable concepts without codable children: Secondary | ICD-10-CM

## 2015-11-20 LAB — BASIC METABOLIC PANEL
BUN: 57 mg/dL — ABNORMAL HIGH (ref 6–23)
CHLORIDE: 101 meq/L (ref 96–112)
CO2: 25 meq/L (ref 19–32)
Calcium: 9.7 mg/dL (ref 8.4–10.5)
Creatinine, Ser: 3.14 mg/dL — ABNORMAL HIGH (ref 0.40–1.50)
GFR: 22.05 mL/min — ABNORMAL LOW (ref >60.00)
GLUCOSE: 104 mg/dL — AB (ref 70–99)
POTASSIUM: 4.2 meq/L (ref 3.5–5.1)
SODIUM: 135 meq/L (ref 135–145)

## 2015-11-20 LAB — LIPID PANEL
Cholesterol: 134 mg/dL (ref 0–200)
HDL: 33.8 mg/dL — AB (ref >39.00)
NONHDL: 99.92
TRIGLYCERIDES: 242 mg/dL — AB (ref 0.0–149.0)
Total CHOL/HDL Ratio: 4
VLDL: 48.4 mg/dL — AB (ref 0.0–40.0)

## 2015-11-20 LAB — LDL CHOLESTEROL, DIRECT: Direct LDL: 69 mg/dL

## 2015-11-20 LAB — HEMOGLOBIN A1C

## 2015-11-23 ENCOUNTER — Other Ambulatory Visit: Payer: Self-pay | Admitting: *Deleted

## 2015-11-23 ENCOUNTER — Ambulatory Visit (INDEPENDENT_AMBULATORY_CARE_PROVIDER_SITE_OTHER): Payer: Medicaid Other | Admitting: Endocrinology

## 2015-11-23 ENCOUNTER — Encounter: Payer: Self-pay | Admitting: Endocrinology

## 2015-11-23 VITALS — BP 128/82 | HR 76 | Temp 97.8°F | Resp 16 | Ht 69.0 in | Wt 313.2 lb

## 2015-11-23 DIAGNOSIS — E1165 Type 2 diabetes mellitus with hyperglycemia: Secondary | ICD-10-CM

## 2015-11-23 DIAGNOSIS — E1122 Type 2 diabetes mellitus with diabetic chronic kidney disease: Secondary | ICD-10-CM | POA: Diagnosis not present

## 2015-11-23 DIAGNOSIS — Z794 Long term (current) use of insulin: Secondary | ICD-10-CM | POA: Diagnosis not present

## 2015-11-23 DIAGNOSIS — I951 Orthostatic hypotension: Secondary | ICD-10-CM | POA: Diagnosis not present

## 2015-11-23 DIAGNOSIS — N184 Chronic kidney disease, stage 4 (severe): Secondary | ICD-10-CM

## 2015-11-23 MED ORDER — FLUDROCORTISONE ACETATE 0.1 MG PO TABS: 0.1000 mg | ORAL_TABLET | Freq: Every day | ORAL | Status: DC

## 2015-11-23 NOTE — Patient Instructions (Addendum)
Try 30 unit pump  May need to click upto 6-7 times  Lasix only 40mg  in am and take 2 if having swelling  Florinef every 2 days  Call if having swelling or breathing issues

## 2015-11-23 NOTE — Progress Notes (Signed)
Patient ID: Ethan Campbell, male   DOB: 01-Jul-1961, 55 y.o.   MRN: [id number]   Reason for Appointment: Diabetes follow-up   History of Present Illness   Diagnosis: Type 2 DIABETES MELITUS, date of diagnosis: 2000    Previous history: he has been on insulin for several years with consistently poor control Has been requiring large doses of insulin for his diabetes but A1c has been persistently high Blood sugars did not improve significantly even with trying Byetta and Victoza In 2014 he was switched from NovoLog to U-500 insulin but not clear if he has had improvement in control except with fasting readings His prior A1c was 14.0 in 5/14 and he had educational discussions with diabetes educator and dietitian in 6/14   Recent history:  Insulin regimen:  Humulin R U-500 with V-go pump, 30 units basal.Boluses 8 units per meal    He was started on the V-go pump in 2/17 because of marked variability in blood sugars and overall high readings persistently  His A1c is only slightly better at 11.4  Current management, blood sugar patterns and problems identified:  Although at the onset his sugars were much better with the pump they appear to be poorly controlled again now  He is using the pump appropriately and changing it every morning at 9 AM.  His fasting and overnight blood sugars are relatively lower but not consistent  Blood sugars are generally trending higher as the day goes on with marked increase in blood sugars in the evenings and bedtime  Overall blood sugars are still averaging 236 for the last month  He is using about 16 units to cover his meals and will take 10 units at bedtime if blood sugars are significantly high without causing hypoglycemia usually  He thinks that he is trying to eat relatively low fat meals usually, avoiding pizza, rice and other large amounts of carbohydrates usually.  He thinks [place] food makes his blood sugar go up  HYPOGLYCEMIA has  occurred only rarely recently, once possibly with overcorrection the night before and also a few readings in the 60s 2-3 weeks ago in the morning  Mealtimes: 12 noon, 6 pm  Proper timing of medications in relation to meals: Yes, usually 30 minutes before eating .          Monitors blood glucose:  1-2 times a day.    Glucometer:  Accu-Chek     Blood Glucose readings   Mean values apply above for all meters except median for One Touch  PRE-MEAL Fasting Midday  Dinner Bedtime Overall  Glucose range: 43-422  72-438  90-481  238-467    Mean/median: 200  202  280  348  236    Physical activity: exercise: Unable to  Last dietitian visit: 6/14 and last CDE visit in 4/16  Wt Readings from Last 3 Encounters:  11/23/15 313 lb 3.2 oz (142.067 kg)  09/30/15 315 lb (142.883 kg)  09/11/15 312 lb 6.4 oz (141.704 kg)    LABS:   Lab Results  Component Value Date   HGBA1C 11.4 Repeated and verified X2.* 11/20/2015   HGBA1C 12.1 08/13/2015   HGBA1C 10.7 05/27/2015   Lab Results  Component Value Date   MICROALBUR 7.0* 12/30/2013   LDLCALC 83 10/13/2014   CREATININE 3.14* 11/20/2015     other active problems discussed in review of systems     Medication List       This list is accurate as of: 11/23/15 11:59  PM.  Always use your most recent med list.               ACCU-CHEK AVIVA PLUS test strip  Generic drug:  glucose blood  TEST UP TO 4 TIMES A DAY.     amLODipine 10 MG tablet  Commonly known as:  NORVASC  Take 10 mg by mouth daily.     atorvastatin 80 MG tablet  Commonly known as:  LIPITOR  TAKE 1 TABLET DAILY.     benazepril 20 MG tablet  Commonly known as:  LOTENSIN  Take 20 mg by mouth daily.     fenofibrate 145 MG tablet  Commonly known as:  TRICOR  Take 145 mg by mouth daily.     fludrocortisone 0.1 MG tablet  Commonly known as:  FLORINEF  Take 1 tablet (0.1 mg total) by mouth daily.     FLUoxetine 20 MG capsule  Commonly known as:  PROZAC  Take 20 mg by  mouth daily.     furosemide 80 MG tablet  Commonly known as:  LASIX  Take 40-80 mg by mouth 2 (two) times daily. Takes one tablet in the morning and one-half tablet in the evening     HUMULIN R 500 UNIT/ML injection  Generic drug:  insulin regular human CONCENTRATED  AS DIRECTED UP TO 35 UNITS 3 TIMES DAILY.     HYDROcodone-acetaminophen 10-325 MG tablet  Commonly known as:  NORCO  Take 1 tablet by mouth every 6 (six) hours as needed for moderate pain or severe pain.     Insulin Syringe-Needle U-100 27G X 1/2" 1 ML Misc  Use to fill V-go pump daily     Liraglutide 18 MG/3ML Sopn  Commonly known as:  VICTOZA  INJECT 1.8 MG SUBCUTANEOUSLY ONCE DAILY.     midodrine 10 MG tablet  Commonly known as:  PROAMATINE  Take 10 mg by mouth 3 (three) times daily.     potassium chloride 10 MEQ tablet  Commonly known as:  K-DUR  TAKE 2 TABLETS BY MOUTH ONCE DAILY.     tiZANidine 4 MG tablet  Commonly known as:  ZANAFLEX  Take 4 mg by mouth 2 (two) times daily.     V-GO 20 Kit  Use one per day     Vitamin D (Ergocalciferol) 50000 units Caps capsule  Commonly known as:  DRISDOL  TAKE 1 CAPSULE TWICE A WEEK.        Allergies: No Known Allergies  Past Medical History  Diagnosis Date  . Sleep apnea     Dr. Brandon Melnick  . Venous insufficiency   . Proteinuria   . Obesity   . Type 2 diabetes mellitus ([place])   . Essential hypertension   . Precordial pain June 2011    Nuclear stress; no ischemia; EF 60%  . Hyperlipidemia   . Insomnia   . History of stroke     Past Surgical History  Procedure Laterality Date  . 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 Total knee arthroplasty Left 2013    Family History  Problem Relation Age of Onset  . Heart attack Mother 19  . Breast cancer Mother   . Stroke Mother   . Heart attack Father 37  . Breast cancer Sister   . Colon cancer Sister     Social History:  reports that he has quit smoking.  His smoking use included Cigarettes. He has never used  smokeless tobacco. He reports that he does not drink alcohol or use illicit drugs.  Review of Systems:  Blackout episodes: He has had  Recurrent episodes again despite his taking 15 mg a midodrine 3 times a day  He does not monitor blood pressure at home. Does not feel lightheaded Has not been tried on Florinef before   Lipids: triglycerides have been relatively high despite taking fenofibrate. LDL is below 100, taking Lipitor   Lab Results  Component Value Date   CHOL 134 11/20/2015   HDL 33.80* 11/20/2015   LDLCALC 83 10/13/2014   LDLDIRECT 69.0 11/20/2015   TRIG 242.0* 11/20/2015   CHOLHDL 4 11/20/2015      CKD: His creatinine has been Consistently higher, to be seen by nephrologist   Lab Results  Component Value Date   CREATININE 3.14* 11/20/2015   He has been told to have CHF In the past and is taking Lasix 120 mg daily along with potassium but has been told by cardiologist to be seen only as needed.  Chest x-rays do not show cardiomegaly  Examination:   BP 128/82 mmHg  Pulse 76  Temp(Src) 97.8 F (36.6 C)  Resp 16  Ht _0  (1.753 m)  Wt 313 lb 3.2 oz (142.067 kg)  BMI 46.23 kg/m2  SpO2 95%  Body mass index is 46.23 kg/(m^2).   The blood pressure Standing was 70/40 with large cuff on both arms confirmed by palpation No edema  ASSESSMENT/ PLAN:   Diabetes type 2 with obesity and poor control See history of present illness for detailed discussion of current diabetes management, blood sugar patterns and problems identified  Although he was doing better with the V-go pump at start up his blood sugars are back to about the same level and mostly high later in the day indicating higher postprandial readings Usually having high overnight blood sugars unless he takes correction doses at night Currently not able to exercise because of his syncopal episodes  Recommendations:  He was given a sample of the 30  unit  V-go pump to see if a higher basal rate would help his blood sugar control while keeping the boluses the same  May take correction doses for high readings at bedtime  Meanwhile at larger meals he will take 12-14 units boluses  Consistently low fat diet and avoid high fat and high carbohydrate meals  ORTHOSTATIC hypotension: He is significantly orthostatic despite midodrine and this may well be from autonomic neuropathy related to diabetic neuropathy Not benefiting at all from midodrine and having recurrent episodes of syncope again  Despite his renal dysfunction we should still try him on Florinef at least every other day He will monitor his legs for edema also let us know if he has any dyspnea or Meanwhile  reduce his Lasix to 40 mg as he has had no evidence of cardiac dysfunction recently  He will have follow-up in 2 weeks with repeat electrolytes   Patient Instructions  Try 30 unit pump  May need to click upto 6-7 times  Lasix only 79m in am and take 2 if having swelling  Florinef every 2 days  Call if having swelling or breathing issues     Counseling time on subjects discussed above is over 50% of today's 25 minute visit    Moselle Rister 11/24/2015, 8:12 AM

## 2015-12-09 ENCOUNTER — Other Ambulatory Visit: Payer: Self-pay | Admitting: Endocrinology

## 2015-12-11 ENCOUNTER — Encounter: Payer: Self-pay | Admitting: Endocrinology

## 2015-12-11 ENCOUNTER — Ambulatory Visit (INDEPENDENT_AMBULATORY_CARE_PROVIDER_SITE_OTHER): Payer: Medicaid Other | Admitting: Endocrinology

## 2015-12-11 VITALS — BP 106/54 | HR 80 | Ht 69.0 in | Wt 318.0 lb

## 2015-12-11 DIAGNOSIS — E1165 Type 2 diabetes mellitus with hyperglycemia: Secondary | ICD-10-CM | POA: Diagnosis not present

## 2015-12-11 DIAGNOSIS — Z794 Long term (current) use of insulin: Secondary | ICD-10-CM

## 2015-12-11 NOTE — Patient Instructions (Signed)
No clicks at bedtime unless sugar >250

## 2015-12-11 NOTE — Progress Notes (Signed)
Patient ID: Ethan Campbell, male   DOB: Dec 31, 1960, 55 y.o.   MRN: [id number]   Reason for Appointment: Diabetes follow-up   History of Present Illness   Diagnosis: Type 2 DIABETES MELITUS, date of diagnosis: 2000    Previous history: he has been on insulin for several years with consistently poor control Has been requiring large doses of insulin for his diabetes but A1c has been persistently high Blood sugars did not improve significantly even with trying Byetta and Victoza In 2014 he was switched from NovoLog to U-500 insulin but not clear if he has had improvement in control except with fasting readings His prior A1c was 14.0 in 5/14 and he had educational discussions with diabetes educator and dietitian in 6/14   Recent history:  Insulin regimen:  Humulin R U-500 with V-go pump, 30 units basal. Boluses 8 units per meal and 6-8 at bedtime   He was started on the V-go pump in 2/17 because of marked variability in blood sugars and overall high readings persistently On his follow-up on 5/1 because of his glucose being mostly high he was changed from the 20th 2-30 units pump  Current management, blood sugar patterns and problems identified:  He is using the pump as directed but even though he was given a sample of 6 pumps he has made this last for about 10 days or so  Overall his blood sugars are better although still averaging over 200  FASTING blood sugars with using the higher basal rate are still variable but not consistently high  He now says that he is bolusing at bedtime also even though he was told not to do this and this is occasionally causing his morning glucose to be low  Blood sugars in the afternoons and evenings are somewhat variable but usually not over 300  He is checking blood sugars somewhat randomly during the day and evening and difficult to cut her consistent pattern   and changing it every morning at 9 AM.  His fasting and overnight blood  sugars are relatively lower but not consistent  Blood sugars are generally trending higher as the day goes on with marked increase in blood sugars in the evenings and bedtime  Overall blood sugars are still averaging 230 for the last month  HYPOGLYCEMIA has occurred twice with low readings that 9:30 and 11:30 AM and a reading of 60 8 in the afternoon  Mealtimes: 12 noon, 6 pm  Proper timing of medications in relation to meals: Yes, usually 30 minutes before eating .          Monitors blood glucose:  1-2 times a day.    Glucometer:  Accu-Chek     Blood Glucose readings from download:  Mean values apply above for all meters except median for One Touch  PRE-MEAL Fasting Lunch Dinner Bedtime Overall  Glucose range: 42-302   68-398  119-378    Mean/median:     230    Physical activity: exercise: Unable to do much  Last dietitian visit: 6/14 and last CDE visit in 4/16  Wt Readings from Last 3 Encounters:  12/11/15 318 lb (144.244 kg)  11/23/15 313 lb 3.2 oz (142.067 kg)  09/30/15 315 lb (142.883 kg)    LABS:   Lab Results  Component Value Date   HGBA1C 11.4 Repeated and verified X2.* 11/20/2015   HGBA1C 12.1 08/13/2015   HGBA1C 10.7 05/27/2015   Lab Results  Component Value Date   MICROALBUR 7.0* 12/30/2013  LDLCALC 83 10/13/2014   CREATININE 3.14* 11/20/2015     other active problems discussed in review of systems     Medication List       This list is accurate as of: 12/11/15  3:12 PM.  Always use your most recent med list.               ACCU-CHEK AVIVA PLUS test strip  Generic drug:  glucose blood  TEST UP TO 4 TIMES A DAY.     amLODipine 10 MG tablet  Commonly known as:  NORVASC  Take 10 mg by mouth daily.     atorvastatin 80 MG tablet  Commonly known as:  LIPITOR  TAKE 1 TABLET DAILY.     benazepril 20 MG tablet  Commonly known as:  LOTENSIN  Take 20 mg by mouth daily.     fenofibrate 145 MG tablet  Commonly known as:  TRICOR  Take 145 mg by  mouth daily.     FLUoxetine 20 MG capsule  Commonly known as:  PROZAC  Take 20 mg by mouth daily.     furosemide 80 MG tablet  Commonly known as:  LASIX  Take 40-80 mg by mouth 2 (two) times daily. Takes one tablet in the morning and one-half tablet in the evening     HUMULIN R 500 UNIT/ML injection  Generic drug:  insulin regular human CONCENTRATED  AS DIRECTED UP TO 35 UNITS 3 TIMES DAILY.     HYDROcodone-acetaminophen 10-325 MG tablet  Commonly known as:  NORCO  Take 1 tablet by mouth every 6 (six) hours as needed for moderate pain or severe pain.     Insulin Syringe-Needle U-100 27G X 1/2" 1 ML Misc  Use to fill V-go pump daily     midodrine 10 MG tablet  Commonly known as:  PROAMATINE  Take 10 mg by mouth 3 (three) times daily.     potassium chloride 10 MEQ tablet  Commonly known as:  K-DUR  TAKE 2 TABLETS BY MOUTH ONCE DAILY.     tiZANidine 4 MG tablet  Commonly known as:  ZANAFLEX  Take 4 mg by mouth 2 (two) times daily.     V-GO 20 Kit  Use one per day     VICTOZA 18 MG/3ML Sopn  Generic drug:  Liraglutide  USE 1.8 MG SUB-Q EACH DAY.     Vitamin D (Ergocalciferol) 50000 units Caps capsule  Commonly known as:  DRISDOL  TAKE 1 CAPSULE TWICE A WEEK.        Allergies: No Known Allergies  Past Medical History  Diagnosis Date  . Sleep apnea     Dr. Brandon Melnick  . Venous insufficiency   . Proteinuria   . Obesity   . Type 2 diabetes mellitus ([place])   . Essential hypertension   . Precordial pain June 2011    Nuclear stress; no ischemia; EF 60%  . Hyperlipidemia   . Insomnia   . History of stroke     Past Surgical History  Procedure Laterality Date  . Resection bone tumor femur  1980's    Left femur, treated at [facility] with bone graft  . Total knee arthroplasty Left 2013    Family History  Problem Relation Age of Onset  . Heart attack Mother 48  . Breast cancer Mother   . Stroke Mother   . Heart attack Father 53  . Breast cancer Sister   . Colon  cancer Sister     Social History:  reports that he has quit smoking. His smoking use included Cigarettes. He has never used smokeless tobacco. He reports that he does not drink alcohol or use illicit drugs.  Review of Systems:  Blackout episodes:  Because of his significant orthostatic hypotension and standing blood pressure of 70 was given a trial of Florinef every other day on the last visit. However he says that with this he was starting to have some swelling in difficulty breathing and has not taken it His PCP has cut his amlodipine to 5 mg and his Lasix was reduced on his last visit to 40 mg twice a day  He has not had any further syncope and is taking 15 mg a midodrine 3 times a day  He does not monitor blood pressure at home.    Lipids: triglycerides have been relatively high despite taking fenofibrate. LDL is below 100, taking Lipitor   Lab Results  Component Value Date   CHOL 134 11/20/2015   HDL 33.80* 11/20/2015   LDLCALC 83 10/13/2014   LDLDIRECT 69.0 11/20/2015   TRIG 242.0* 11/20/2015   CHOLHDL 4 11/20/2015      CKD: His creatinine has been Consistently higher,Followed by nephrologist   Lab Results  Component Value Date   CREATININE 3.14* 11/20/2015   He has been told to have CHF In the past and is taking Lasix 40 mg twice  daily along with potassium  Chest x-rays do not show cardiomegaly  Examination:   BP 106/54 mmHg  Pulse 80  Ht '5\' 9"'  (1.753 m)  Wt 318 lb (144.244 kg)  BMI 46.94 kg/m2  SpO2 97%  Body mass index is 46.94 kg/(m^2).     ASSESSMENT/ PLAN:   Diabetes type 2 with obesity and poor control See history of present illness for detailed discussion of current diabetes management, blood sugar patterns and problems identified  He appears to have better blood sugar control with the 30 units basal on the V-go pump Although he was told only to bolus for his meals he is also bolusing at bedtime even when blood sugar is normal This may be  causing occasional morning hypoglycemia He still has some variability in his blood sugars at all times but overall appears to be getting somewhat better control  Recommendations:  He was continued to 30 units basal  For now continue to bolus 8 units with each meal but may need to adjust this based on his meal size  No boluses at bedtime unless glucose is over 250  ORTHOSTATIC hypotension: He is doing better with reducing his Lasix and amlodipine Could not tolerate Florinef because of edema He will follow-up with PCP and continue midodrine   There are no Patient Instructions on file for this visit.   Counseling time on subjects discussed above is over 50% of today's 25 minute visit    Amily Depp 12/11/2015, 3:12 PM

## 2015-12-28 ENCOUNTER — Other Ambulatory Visit: Payer: Self-pay | Admitting: *Deleted

## 2015-12-28 ENCOUNTER — Telehealth: Payer: Self-pay | Admitting: Internal Medicine

## 2015-12-28 MED ORDER — V-GO 30 KIT: PACK | Status: DC

## 2015-12-28 NOTE — Telephone Encounter (Signed)
rx sent for the V-go 30, patient is aware.

## 2015-12-28 NOTE — Telephone Encounter (Signed)
Patient said he received Vgo 20 instead of Vgo 30.  He said the last time he was here the PCP increased him to Vgo 30.

## 2015-12-31 ENCOUNTER — Telehealth: Payer: Self-pay | Admitting: Endocrinology

## 2015-12-31 NOTE — Telephone Encounter (Signed)
The prescription for the Vgo 30 need to be sent to [facility] [phone]

## 2016-01-04 MED ORDER — V-GO 30 KIT: PACK | Status: DC

## 2016-01-05 MED ORDER — V-GO 30 KIT: PACK | Status: DC

## 2016-01-05 NOTE — Telephone Encounter (Signed)
Rx signed and faxed.

## 2016-01-05 NOTE — Telephone Encounter (Signed)
Rx printed waiting on MD to sign.  

## 2016-01-22 ENCOUNTER — Ambulatory Visit (INDEPENDENT_AMBULATORY_CARE_PROVIDER_SITE_OTHER): Payer: Medicaid Other | Admitting: Endocrinology

## 2016-01-22 ENCOUNTER — Encounter: Payer: Self-pay | Admitting: Endocrinology

## 2016-01-22 VITALS — BP 110/62 | HR 74 | Ht 69.0 in | Wt 315.0 lb

## 2016-01-22 DIAGNOSIS — E1122 Type 2 diabetes mellitus with diabetic chronic kidney disease: Secondary | ICD-10-CM

## 2016-01-22 DIAGNOSIS — Z794 Long term (current) use of insulin: Secondary | ICD-10-CM

## 2016-01-22 DIAGNOSIS — I951 Orthostatic hypotension: Secondary | ICD-10-CM | POA: Diagnosis not present

## 2016-01-22 DIAGNOSIS — N184 Chronic kidney disease, stage 4 (severe): Secondary | ICD-10-CM

## 2016-01-22 DIAGNOSIS — E1165 Type 2 diabetes mellitus with hyperglycemia: Secondary | ICD-10-CM | POA: Diagnosis not present

## 2016-01-22 NOTE — Patient Instructions (Signed)
Click 1 unit per starch plus 1 click, another click if more fat in meal

## 2016-01-22 NOTE — Progress Notes (Signed)
Patient ID: Ethan Campbell, male   DOB: 03-Jan-1961, 55 y.o.   MRN: [id number]   Reason for Appointment: Diabetes follow-up   History of Present Illness   Diagnosis: Type 2 DIABETES MELITUS, date of diagnosis: 2000    Previous history: he has been on insulin for several years with consistently poor control Has been requiring large doses of insulin for his diabetes but A1c has been persistently high Blood sugars did not improve significantly even with trying Byetta and Victoza In 2014 he was switched from NovoLog to U-500 insulin but not clear if he has had improvement in control except with fasting readings His prior A1c was 14.0 in 5/14 and he had educational discussions with diabetes educator and dietitian in 6/14   Recent history:  Insulin regimen:  Humulin R U-500 with V-go pump, 30 units basal. Boluses 8 units per meal and 6-8 at bedtime   He was started on the V-go pump in 2/17 because of marked variability in blood sugars and overall high readings persistently On his follow-up on 5/1 because of his glucose being mostly high he was advised to start using the 30 units pump instead of the 20 unit but he had difficulty getting this supplied and has switched only in the last week  Last A1c still high at 11.4  Current management, blood sugar patterns and problems identified:  He is giving variable answers about when he is changing his insulin pump, usually in the mornings although he may be letting it run until he runs out of boluses  FASTING blood sugars are quite variable  No overnight hypoglycemia with lowest reading during the night 78  Blood sugars are usually much higher in the afternoons and not clear why.  His first meal is somewhat variable in carbohydrate, usually having only 1 or 2 carbohydrate today had 3 carbohydrates and did not take any extra insulin  He is had sugars appear to be somewhat better late evening and bedtime  Overall blood sugars are still  averaging about the same compared to the last visit  HYPOGLYCEMIA minimal recently with only one low normal reading of 60 at bedtime  Mealtimes: 12 noon, 6 pm  Proper timing of medications in relation to meals: Yes, usually 30 minutes before eating .          Monitors blood glucose:  1-2 times a day.    Glucometer:  Accu-Chek     Blood Glucose readings from download:  Mean values apply above for all meters except median for One Touch  PRE-MEAL Fasting Midday  Dinner Bedtime Overall  Glucose range: 75-362   105-408  73-422  60-309    Mean/median: 230  308   168  239+/-110     Physical activity: exercise: Unable to do much  Last dietitian visit: 6/14 and last CDE visit in 4/16  Wt Readings from Last 3 Encounters:  01/22/16 315 lb (142.883 kg)  12/11/15 318 lb (144.244 kg)  11/23/15 313 lb 3.2 oz (142.067 kg)    LABS:   Lab Results  Component Value Date   HGBA1C 11.4 Repeated and verified X2.* 11/20/2015   HGBA1C 12.1 08/13/2015   HGBA1C 10.7 05/27/2015   Lab Results  Component Value Date   MICROALBUR 7.0* 12/30/2013   LDLCALC 83 10/13/2014   CREATININE 3.14* 11/20/2015     other active problems discussed in review of systems     Medication List       This list is accurate  as of: 01/22/16 11:59 PM.  Always use your most recent med list.               ACCU-CHEK AVIVA PLUS test strip  Generic drug:  glucose blood  TEST UP TO 4 TIMES A DAY.     amLODipine 10 MG tablet  Commonly known as:  NORVASC  Take 10 mg by mouth daily.     atorvastatin 80 MG tablet  Commonly known as:  LIPITOR  TAKE 1 TABLET DAILY.     benazepril 20 MG tablet  Commonly known as:  LOTENSIN  Take 20 mg by mouth daily.     ciprofloxacin 500 MG tablet  Commonly known as:  CIPRO  Take 500 mg by mouth 2 (two) times daily.     fenofibrate 145 MG tablet  Commonly known as:  TRICOR  Take 145 mg by mouth daily.     FLUoxetine 20 MG capsule  Commonly known as:  PROZAC  Take 20 mg by  mouth daily.     furosemide 80 MG tablet  Commonly known as:  LASIX  Take by mouth 2 (two) times daily. Takes one tablet in the morning and one-half tablet in the evening     HUMULIN R 500 UNIT/ML injection  Generic drug:  insulin regular human CONCENTRATED  AS DIRECTED UP TO 35 UNITS 3 TIMES DAILY.     HYDROcodone-acetaminophen 10-325 MG tablet  Commonly known as:  NORCO  Take 1 tablet by mouth every 6 (six) hours as needed for moderate pain or severe pain. Reported on 01/22/2016     Insulin Syringe-Needle U-100 27G X 1/2" 1 ML Misc  Use to fill V-go pump daily     midodrine 10 MG tablet  Commonly known as:  PROAMATINE  Take 10 mg by mouth 3 (three) times daily.     potassium chloride 10 MEQ tablet  Commonly known as:  K-DUR  TAKE 2 TABLETS BY MOUTH ONCE DAILY.     tamsulosin 0.4 MG Caps capsule  Commonly known as:  FLOMAX  Take 0.4 mg by mouth.     tiZANidine 4 MG tablet  Commonly known as:  ZANAFLEX  Take 4 mg by mouth 2 (two) times daily.     V-GO 30 Kit  Use one per day     VICTOZA 18 MG/3ML Sopn  Generic drug:  Liraglutide  USE 1.8 MG SUB-Q EACH DAY.     Vitamin D (Ergocalciferol) 50000 units Caps capsule  Commonly known as:  DRISDOL  TAKE 1 CAPSULE TWICE A WEEK.        Allergies: No Known Allergies  Past Medical History  Diagnosis Date  . Sleep apnea     Dr. Brandon Melnick  . Venous insufficiency   . Proteinuria   . Obesity   . Type 2 diabetes mellitus ([place])   . Essential hypertension   . Precordial pain June 2011    Nuclear stress; no ischemia; EF 60%  . Hyperlipidemia   . Insomnia   . History of stroke     Past Surgical History  Procedure Laterality Date  . 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 Total knee arthroplasty Left 2013    Family History  Problem Relation Age of Onset  . Heart attack Mother 103  . Breast cancer Mother   . Stroke Mother   . Heart attack Father 74  . Breast cancer Sister    .  Colon cancer Sister     Social History:  reports that he has quit smoking. His smoking use included Cigarettes. He has never used smokeless tobacco. He reports that he does not drink alcohol or use illicit drugs.  Review of Systems:  Blackout episodes:  He still has occasional syncopal episode with low blood pressure Currently on midodrine only Cardiologist  recommended compression stockings, he is still waiting to get these   Lipids: triglycerides have been relatively high despite taking fenofibrate. LDL is below 100, taking Lipitor   Lab Results  Component Value Date   CHOL 134 11/20/2015   HDL 33.80* 11/20/2015   LDLCALC 83 10/13/2014   LDLDIRECT 69.0 11/20/2015   TRIG 242.0* 11/20/2015   CHOLHDL 4 11/20/2015      CKD: His creatinine has been Consistently higher,Followed by nephrologist   Lab Results  Component Value Date   CREATININE 3.14* 11/20/2015   He has been told to have CHF In the past and is taking Lasix 40 mg twice  daily along with potassium  Chest x-rays do not show cardiomegaly  Examination:   BP 110/62 mmHg  Pulse 74  Ht '5\' 9"'  (1.753 m)  Wt 315 lb (142.883 kg)  BMI 46.50 kg/m2  SpO2 95%  Body mass index is 46.5 kg/(m^2).     ASSESSMENT/ PLAN:   Diabetes type 2 with obesity and poor control See history of present illness for detailed discussion of current diabetes management, blood sugar patterns and problems identified Her blood sugar readings are still very labile but overall higher As discussed above he seems to have higher readings after his first meal He is not adjusting his boluses based on his carbohydrate intake in the morning Also just switched to be 30 units basal on the insulin pump a week ago Also not clear if he is changing his pump exactly 24 hours apart  Recommendations:  He will continue  30 units basal on his V-go pump  Change the pump consistently at the same time daily  Most likely needs to bolus 8 units in the  morning unless eating relatively low carbohydrates  Try to bolus 30 minutes before eating  Extra insulin for higher blood sugars at breakfast and lunch  ORTHOSTATIC hypotension: He may do better with using elastic stockings which he is going to get Currently not able to tolerate Florinef because of history of edema  CKD: Followed by nephrologist, recent information not available   Patient Instructions  Click 1 unit per starch plus 1 click, another click if more fat in meal     Counseling time on subjects discussed above is over 50% of today's 25 minute visit    Ethan Campbell 01/24/2016, 4:01 PM

## 2016-03-07 ENCOUNTER — Encounter: Payer: Self-pay | Admitting: Endocrinology

## 2016-03-07 ENCOUNTER — Telehealth: Payer: Self-pay | Admitting: Endocrinology

## 2016-03-07 ENCOUNTER — Ambulatory Visit (INDEPENDENT_AMBULATORY_CARE_PROVIDER_SITE_OTHER): Payer: Medicaid Other | Admitting: Endocrinology

## 2016-03-07 VITALS — BP 132/84 | HR 60 | Ht 69.0 in | Wt 305.0 lb

## 2016-03-07 DIAGNOSIS — N183 Chronic kidney disease, stage 3 unspecified: Secondary | ICD-10-CM

## 2016-03-07 DIAGNOSIS — Z794 Long term (current) use of insulin: Secondary | ICD-10-CM | POA: Diagnosis not present

## 2016-03-07 DIAGNOSIS — E1165 Type 2 diabetes mellitus with hyperglycemia: Secondary | ICD-10-CM

## 2016-03-07 LAB — LIPID PANEL
Cholesterol: 147 mg/dL (ref 0–200)
HDL: 31.9 mg/dL — ABNORMAL LOW (ref >39.00)
NONHDL: 115.31
Total CHOL/HDL Ratio: 5
Triglycerides: 251 mg/dL — ABNORMAL HIGH (ref 0.0–149.0)
VLDL: 50.2 mg/dL — ABNORMAL HIGH (ref 0.0–40.0)

## 2016-03-07 LAB — BASIC METABOLIC PANEL
BUN: 23 mg/dL (ref 6–23)
CALCIUM: 9.8 mg/dL (ref 8.4–10.5)
CHLORIDE: 97 meq/L (ref 96–112)
CO2: 31 mEq/L (ref 19–32)
CREATININE: 2.22 mg/dL — AB (ref 0.40–1.50)
GFR: 32.86 mL/min — ABNORMAL LOW (ref >60.00)
Glucose, Bld: 204 mg/dL — ABNORMAL HIGH (ref 70–99)
Potassium: 4 mEq/L (ref 3.5–5.1)
Sodium: 136 mEq/L (ref 135–145)

## 2016-03-07 LAB — LDL CHOLESTEROL, DIRECT: LDL DIRECT: 80 mg/dL

## 2016-03-07 LAB — MICROALBUMIN / CREATININE URINE RATIO
CREATININE, U: 58.8 mg/dL
MICROALB UR: 0.7 mg/dL (ref 0.0–1.9)
Microalb Creat Ratio: 1.2 mg/g (ref 0.0–30.0)

## 2016-03-07 LAB — HEMOGLOBIN A1C: HEMOGLOBIN A1C: 10.4 % — AB (ref 4.6–6.5)

## 2016-03-07 NOTE — Patient Instructions (Signed)
Change over to the 20 units V-go pump  At mealtimes do the following number of clicks: Lunchtime = 4 clicks and SUPPERTIME = 5 clicks Increase the number of clicks at mealtimes by 1 click when it is over [id number] and use #2 clicks if it is over [id number] before the meal At bedtime use one click only if it is over 300  Call blood sugar readings in one week

## 2016-03-07 NOTE — Progress Notes (Signed)
Patient ID: Ethan Campbell, male   DOB: 26-Dec-1960, 55 y.o.   MRN: [id number]   Reason for Appointment: Diabetes follow-up   History of Present Illness   Diagnosis: Type 2 DIABETES MELITUS, date of diagnosis: 2000    Previous history: he has been on insulin for several years with consistently poor control Has been requiring large doses of insulin for his diabetes but A1c has been persistently high Blood sugars did not improve significantly even with trying Byetta and Victoza In 2014 he was switched from NovoLog to U-500 insulin but not clear if he has had improvement in control except with fasting readings His prior A1c was 14.0 in 5/14 and he had educational discussions with diabetes educator and dietitian in 6/14   Recent history:  Insulin regimen:  Humulin R U-500 with V-go pump, 30 units basal. Boluses 3 units per meal    He was started on the V-go pump in 2/17 because of marked variability in blood sugars and overall high readings persistently On his follow-up on 5/1 because of his glucose being mostly high he was advised to start using the 30 units pump instead of the 20 unit  A1c still high at 10.4  Current management, blood sugar patterns and problems identified:  He is still having labile blood sugars  FASTING blood sugars are somewhat variable but tentatively lower especially in the last 2 weeks with episodes of low blood sugars  Blood sugars in the mornings are not correlated with blood sugars the night before except once or twice it may be related to bolusing for high readings the night before  His pump was stopped when he was admitted to the hospital until last Thursday  He is having fair readings overall in the afternoon after lunch but mostly higher readings after evening meal at night at bedtime  He has reduced his boluses at suppertime to 6 units instead of 8 and not clear why  Has no hypoglycemia later in the day except once at 1 PM  Although he  thinks he is still eating 2 meals a day he is checking his morning readings at variable's times  He says that he will only take 2 units extra for blood sugars over 250 at bedtime  Overall blood sugars are still averaging slightly lower but more variable compared to last visit   He has however lost weight since last visit  HYPOGLYCEMIA frequently between 6 AM-12 noon or so  Mealtimes: 12 noon, 6 pm  Proper timing of medications in relation to meals: Yes, usually 30 minutes before eating .          Monitors blood glucose:  1-2 times a day.    Glucometer:  Accu-Chek     Blood Glucose readings from download:  Mean values apply above for all meters except median for One Touch  PRE-MEAL Fasting Lunch Dinner Bedtime Overall  Glucose range: 42-343  131, 320 73-421   Mean/median: 200   206 210+/-123    Physical activity: exercise: Unable to do much  Last dietitian visit: 6/14 and last CDE visit in 4/16  Wt Readings from Last 3 Encounters:  03/07/16 (!) 305 lb (138.3 kg)  01/22/16 (!) 315 lb (142.9 kg)  12/11/15 (!) 318 lb (144.2 kg)    LABS:   Lab Results  Component Value Date   HGBA1C 10.4 (H) 03/07/2016   HGBA1C 11.4 Repeated and verified X2. (H) 11/20/2015   HGBA1C 12.1 08/13/2015   Lab Results  Component Value Date   MICROALBUR 0.7 03/07/2016   LDLCALC 83 10/13/2014   CREATININE 2.22 (H) 03/07/2016     other active problems discussed in review of systems     Medication List       Accurate as of 03/07/16  8:51 PM. Always use your most recent med list.          ACCU-CHEK AVIVA PLUS test strip Generic drug:  glucose blood TEST UP TO 4 TIMES A DAY.   amLODipine 10 MG tablet Commonly known as:  NORVASC Take 10 mg by mouth daily.   atorvastatin 80 MG tablet Commonly known as:  LIPITOR TAKE 1 TABLET DAILY.   benazepril 20 MG tablet Commonly known as:  LOTENSIN Take 20 mg by mouth daily.   ciprofloxacin 500 MG tablet Commonly known as:  CIPRO Take 500  mg by mouth 2 (two) times daily.   enoxaparin 150 MG/ML injection Commonly known as:  LOVENOX Inject 140 mg into the skin.   fenofibrate 145 MG tablet Commonly known as:  TRICOR Take 145 mg by mouth daily.   FLUoxetine 20 MG capsule Commonly known as:  PROZAC Take 20 mg by mouth daily.   furosemide 80 MG tablet Commonly known as:  LASIX Take by mouth 2 (two) times daily. Takes one tablet in the morning and one-half tablet in the evening   HUMULIN R 500 UNIT/ML injection Generic drug:  insulin regular human CONCENTRATED AS DIRECTED UP TO 35 UNITS 3 TIMES DAILY.   HYDROcodone-acetaminophen 10-325 MG tablet Commonly known as:  NORCO Take 1 tablet by mouth every 6 (six) hours as needed for moderate pain or severe pain. Reported on 01/22/2016   Insulin Syringe-Needle U-100 27G X 1/2" 1 ML Misc Use to fill V-go pump daily   midodrine 10 MG tablet Commonly known as:  PROAMATINE Take 10 mg by mouth 3 (three) times daily.   potassium chloride 10 MEQ tablet Commonly known as:  K-DUR TAKE 2 TABLETS BY MOUTH ONCE DAILY.   tamsulosin 0.4 MG Caps capsule Commonly known as:  FLOMAX Take 0.4 mg by mouth.   tiZANidine 4 MG tablet Commonly known as:  ZANAFLEX Take 4 mg by mouth 2 (two) times daily.   V-GO 30 Kit Use one per day   VICTOZA 18 MG/3ML Sopn Generic drug:  Liraglutide USE 1.8 MG SUB-Q EACH DAY.   Vitamin D (Ergocalciferol) 50000 units Caps capsule Commonly known as:  DRISDOL TAKE 1 CAPSULE TWICE A WEEK.   warfarin 7.5 MG tablet Commonly known as:  COUMADIN Take 7.5 mg by mouth.       Allergies: No Known Allergies  Past Medical History:  Diagnosis Date  . Essential hypertension   . History of stroke   . Hyperlipidemia   . Insomnia   . Obesity   . Precordial pain June 2011   Nuclear stress; no ischemia; EF 60%  . Proteinuria   . Sleep apnea    Dr. Brandon Melnick  . Type 2 diabetes mellitus ([place])   . Venous insufficiency     Past Surgical History:    Procedure Laterality Date  . RESECTION BONE TUMOR FEMUR  1980's   Left femur, treated at [facility] with bone graft  . TOTAL KNEE ARTHROPLASTY Left 2013    Family History  Problem Relation Age of Onset  . Heart attack Mother 82  . Breast cancer Mother   . Stroke Mother   . Heart attack Father 75  . Breast cancer Sister   .  Colon cancer Sister     Social History:  reports that he has quit smoking. His smoking use included Cigarettes. He has never used smokeless tobacco. He reports that he does not drink alcohol or use drugs.  Review of Systems:    Currently on midodrine only for his orthostatic hypotension but does not think he is having as much orthostatic lightheadedness Cardiologist  recommended compression stockings, has not been able to get these  He now thinks he has had seizures and is on treatment with less episodes of syncope  Lipids: triglycerides have been relatively high despite taking fenofibrate. LDL is below 100, taking Lipitor   Lab Results  Component Value Date   CHOL 147 03/07/2016   HDL 31.90 (L) 03/07/2016   LDLCALC 83 10/13/2014   LDLDIRECT 80.0 03/07/2016   TRIG 251.0 (H) 03/07/2016   CHOLHDL 5 03/07/2016      CKD: His creatinine has been Consistently high and Followed by nephrologist   Lab Results  Component Value Date   CREATININE 2.22 (H) 03/07/2016   He has been told to have CHF In the past and is taking Lasix 40 mg twice  daily along with potassium    Examination:   BP 132/84   Pulse 60   Ht '5\' 9"'  (1.753 m)   Wt (!) 305 lb (138.3 kg)   BMI 45.04 kg/m   Body mass index is 45.04 kg/m.     ASSESSMENT/ PLAN:   Diabetes type 2 with obesity and poor control See history of present illness for detailed discussion of current diabetes management, blood sugar patterns and problems identified Her blood sugar readings are still very labile but overall somewhat better Not clear if this recent low blood sugar tendencies related to his weight  loss recently He is mostly having low readings fasting even though on the 20 basal he was having relatively high readings Highest readings are at bedtime Does not appear to have correlation of low sugars in the morning related to the night time readings Not getting excessive boluses at bedtime Also his diet is again somewhat variable and occasionally higher in fact  Recommendations:  He will again try to use the 20 units basal pump and called readings on Friday for adjustment  Change the pump consistently at the same time daily  Most likely needs to bolus 8 units in the evening at suppertime unless eating relatively low carbohydratemeal  Given him correction doses for high readings  Try to bolus 30 minutes before eating  ORTHOSTATIC hypotension: improved    CKD: Followed by nephrologist, recent information not available   Patient Instructions  Change over to the 20 units V-go pump  At mealtimes do the following number of clicks: Lunchtime = 4 clicks and SUPPERTIME = 5 clicks Increase the number of clicks at mealtimes by 1 click when it is over 161 and use #2 clicks if it is over 096 before the meal At bedtime use one click only if it is over 300  Call blood sugar readings in one week    Counseling time on subjects discussed above is over 50% of today's 25 minute visit    Kawanna Christley 03/07/2016, 8:51 PM

## 2016-03-09 ENCOUNTER — Other Ambulatory Visit: Payer: Self-pay | Admitting: Endocrinology

## 2016-04-11 ENCOUNTER — Other Ambulatory Visit: Payer: Self-pay | Admitting: Endocrinology

## 2016-04-19 ENCOUNTER — Ambulatory Visit (INDEPENDENT_AMBULATORY_CARE_PROVIDER_SITE_OTHER): Payer: Medicaid Other | Admitting: Endocrinology

## 2016-04-19 ENCOUNTER — Encounter: Payer: Self-pay | Admitting: Endocrinology

## 2016-04-19 VITALS — BP 99/66 | HR 86 | Temp 98.0°F | Resp 16 | Ht 69.0 in | Wt 304.4 lb

## 2016-04-19 DIAGNOSIS — E038 Other specified hypothyroidism: Secondary | ICD-10-CM | POA: Diagnosis not present

## 2016-04-19 DIAGNOSIS — Z794 Long term (current) use of insulin: Secondary | ICD-10-CM

## 2016-04-19 DIAGNOSIS — Z23 Encounter for immunization: Secondary | ICD-10-CM | POA: Diagnosis not present

## 2016-04-19 DIAGNOSIS — E1165 Type 2 diabetes mellitus with hyperglycemia: Secondary | ICD-10-CM

## 2016-04-19 DIAGNOSIS — E063 Autoimmune thyroiditis: Secondary | ICD-10-CM

## 2016-04-19 LAB — TSH: TSH: 1.77 u[IU]/mL (ref 0.35–4.50)

## 2016-04-19 LAB — T4, FREE: FREE T4: 1.08 ng/dL (ref 0.60–1.60)

## 2016-04-19 NOTE — Progress Notes (Signed)
Patient ID: Ethan Campbell, male   DOB: 1961-05-31, 55 y.o.   MRN: [id number]   Reason for Appointment: Diabetes follow-up   History of Present Illness   Diagnosis: Type 2 DIABETES MELITUS, date of diagnosis: 2000    Previous history: he has been on insulin for several years with consistently poor control Has been requiring large doses of insulin for his diabetes but A1c has been persistently high Blood sugars did not improve significantly even with trying Byetta and Victoza In 2014 he was switched from NovoLog to U-500 insulin but not clear if he has had improvement in control except with fasting readings His prior A1c was 14.0 in 5/14 and he had educational discussions with diabetes educator and dietitian in 6/14   Recent history:  Insulin regimen:  Humulin R U-500 with V-go pump, 20 units basal. Boluses 3 units per meal    He was started on the V-go pump in 2/17 because of marked variability in blood sugars and overall high readings persistently On his follow-up in 8/17 he was having relatively low sugars waking up and he was switched back to the 20 unit V-go pump  A1c still high at 10.4 in August  Current management, blood sugar patterns and problems identified:  He is still having labile blood sugars  FASTING blood sugars are somewhat variable but not consistently high and has had only one relatively low blood sugar at 2 AM and also at 7 AM  Has only very sporadic hypoglycemia, once after doing correction for a high reading  Blood sugars are consistently high in the afternoons and evenings with only rare good blood sugars late at night or during the night  He will increase his bolus by 1-2 clicks for high blood sugars  Not always checking blood sugars before each meal  Also not clear why he has markedly high blood sugars late at night occasionally, he does not think he is having excessive snacks or drinks and he is usually trying to be compliant with boluses at  meals  Also he thinks that he tries to eat more low fat meals now  HYPOGLYCEMIA    Mealtimes: 12 noon, 6 pm  Proper timing of medications in relation to meals: Yes, usually 30 minutes before eating .          Monitors blood glucose:  1-2 times a day.    Glucometer:  Accu-Chek     Blood Glucose readings from download:  Mean values apply above for all meters except median for One Touch  PRE-MEAL Fasting Lunch Dinner Bedtime Overall  Glucose range: 55-277   50-372  72-415    Mean/median: 160    218   Overnight: 48-527  Physical activity: exercise: Unable to do much  Last dietitian visit: 6/14 and last CDE visit in 4/16  Wt Readings from Last 3 Encounters:  04/19/16 (!) 304 lb 6.4 oz (138.1 kg)  03/07/16 (!) 305 lb (138.3 kg)  01/22/16 (!) 315 lb (142.9 kg)    LABS:   Lab Results  Component Value Date   HGBA1C 10.4 (H) 03/07/2016   HGBA1C 11.4 Repeated and verified X2. (H) 11/20/2015   HGBA1C 12.1 08/13/2015   Lab Results  Component Value Date   MICROALBUR 0.7 03/07/2016   LDLCALC 83 10/13/2014   CREATININE 2.22 (H) 03/07/2016     other active problems discussed in review of systems     Medication List       Accurate as of 04/19/16  11:59 PM. Always use your most recent med list.          ACCU-CHEK AVIVA PLUS test strip Generic drug:  glucose blood TEST UP TO 4 TIMES A DAY.   amLODipine 10 MG tablet Commonly known as:  NORVASC Take 10 mg by mouth daily.   atorvastatin 80 MG tablet Commonly known as:  LIPITOR TAKE 1 TABLET DAILY.   benazepril 20 MG tablet Commonly known as:  LOTENSIN Take 20 mg by mouth daily.   ciprofloxacin 500 MG tablet Commonly known as:  CIPRO Take 500 mg by mouth 2 (two) times daily.   enoxaparin 150 MG/ML injection Commonly known as:  LOVENOX Inject 140 mg into the skin.   fenofibrate 145 MG tablet Commonly known as:  TRICOR Take 145 mg by mouth daily.   FLUoxetine 20 MG capsule Commonly known as:  PROZAC Take 20  mg by mouth daily.   furosemide 80 MG tablet Commonly known as:  LASIX Take by mouth 2 (two) times daily. Takes one tablet in the morning and one-half tablet in the evening   HUMULIN R 500 UNIT/ML injection Generic drug:  insulin regular human CONCENTRATED AS DIRECTED UP TO 35 UNITS 3 TIMES DAILY.   HYDROcodone-acetaminophen 10-325 MG tablet Commonly known as:  NORCO Take 1 tablet by mouth every 6 (six) hours as needed for moderate pain or severe pain. Reported on 01/22/2016   Insulin Syringe-Needle U-100 27G X 1/2" 1 ML Misc Use to fill V-go pump daily   midodrine 10 MG tablet Commonly known as:  PROAMATINE Take 10 mg by mouth 3 (three) times daily.   potassium chloride 10 MEQ tablet Commonly known as:  K-DUR TAKE 2 TABLETS BY MOUTH ONCE DAILY.   tamsulosin 0.4 MG Caps capsule Commonly known as:  FLOMAX Take 0.4 mg by mouth.   tiZANidine 4 MG tablet Commonly known as:  ZANAFLEX Take 4 mg by mouth 2 (two) times daily.   V-GO 20 Kit by Does not apply route.   V-GO 30 Kit Use one per day   VICTOZA 18 MG/3ML Sopn Generic drug:  Liraglutide USE 1.8 MG SUB-Q EACH DAY.   Vitamin D (Ergocalciferol) 50000 units Caps capsule Commonly known as:  DRISDOL TAKE 1 CAPSULE TWICE A WEEK   warfarin 7.5 MG tablet Commonly known as:  COUMADIN Take 7.5 mg by mouth.       Allergies: No Known Allergies  Past Medical History:  Diagnosis Date  . Essential hypertension   . History of stroke   . Hyperlipidemia   . Insomnia   . Obesity   . Precordial pain June 2011   Nuclear stress; no ischemia; EF 60%  . Proteinuria   . Sleep apnea    Dr. Brandon Melnick  . Type 2 diabetes mellitus ([facility])   . Venous insufficiency     Past Surgical History:  Procedure Laterality Date  . RESECTION BONE TUMOR FEMUR  1980's   Left femur, treated at [facility] with bone graft  . TOTAL KNEE ARTHROPLASTY Left 2013    Family History  Problem Relation Age of Onset  . Heart attack Mother 99  . Breast  cancer Mother   . Stroke Mother   . Heart attack Father 31  . Breast cancer Sister   . Colon cancer Sister     Social History:  reports that he has quit smoking. His smoking use included Cigarettes. He has never used smokeless tobacco. He reports that he does not drink alcohol or use drugs.  Review of Systems:   Hypothyroid, treated by PCP, no labs available He does not think he has been taking any medication for a couple of months and not clear why, also not clear what dose he is taking He complains of feeling sleepy all the time  Lab Results  Component Value Date   TSH 1.77 04/19/2016     Currently on midodrine  for his orthostatic HYPOTENSION but does not think he is having as much orthostatic lightheadedness Cardiologist  recommended compression stockings, has not been able to get these However he continues to be treated with Lotensin 20 mg daily  He now thinks he has had seizures and is on treatment with less episodes of syncope  Lipids: triglycerides have been relatively high despite taking fenofibrate. LDL is below 100, taking Lipitor   Lab Results  Component Value Date   CHOL 147 03/07/2016   HDL 31.90 (L) 03/07/2016   LDLCALC 83 10/13/2014   LDLDIRECT 80.0 03/07/2016   TRIG 251.0 (H) 03/07/2016   CHOLHDL 5 03/07/2016      CKD: His creatinine has been Consistently high and Followed by nephrologist   Lab Results  Component Value Date   CREATININE 2.22 (H) 03/07/2016   He has been told to have CHF In the past and is taking Lasix 40 mg twice  daily along with potassium    Examination:   BP 99/66   Pulse 86   Temp 98 F (36.7 C)   Resp 16   Ht _0  (1.753 m)   Wt (!) 304 lb 6.4 oz (138.1 kg)   SpO2 97%   BMI 44.95 kg/m   Body mass index is 44.95 kg/m.     ASSESSMENT/ PLAN:   Diabetes type 2 with obesity and poor control See history of present illness for detailed discussion of current diabetes management, blood sugar patterns and problems  identified Her blood sugar readings are still very labile And averaging about the same A1c is significantly high again but improving However he has somewhat better controlled with the V-go pump compared to previous regimens   Recommendations:  He will try to check his blood sugars more consistently 3 times a day  He will need to add another 2 units bolus for each meal unless he is eating a salad  Also with checking his blood sugars more consistently he will need to add extra 1-2 clicks when blood sugars are over 200  He will not take excessive insulin if he is having readings around 200 at bedtime  Try to bolus 30 minutes before eating  ORTHOSTATIC hypotension: improved but blood pressure still low, he can reduce his Lotensin to half tablet    CKD: Followed by nephrologist   Patient Instructions  Cut Benazepril in 1/2  Click 4 times for meals, 3 for salads, extra 1-2 for over 200    Counseling time on subjects discussed above is over 50% of today's 25 minute visit    Keelyn Fjelstad 04/20/2016, 10:10 AM

## 2016-04-19 NOTE — Patient Instructions (Addendum)
Cut Benazepril in 1/2  Click 4 times for meals, 3 for salads, extra 1-2 for over 200

## 2016-04-21 NOTE — Progress Notes (Signed)
Please let patient know that the thyroid result is normal and need to know if he is actually taking any thyroid medication

## 2016-05-11 ENCOUNTER — Other Ambulatory Visit: Payer: Self-pay | Admitting: Endocrinology

## 2016-05-11 MED ORDER — GLUCOSE BLOOD VI STRP: ORAL_STRIP | 3 refills | Status: DC

## 2016-06-05 ENCOUNTER — Other Ambulatory Visit: Payer: Self-pay | Admitting: Endocrinology

## 2016-06-05 DIAGNOSIS — I10 Essential (primary) hypertension: Secondary | ICD-10-CM

## 2016-06-15 ENCOUNTER — Other Ambulatory Visit: Payer: Medicaid Other

## 2016-06-20 ENCOUNTER — Ambulatory Visit (INDEPENDENT_AMBULATORY_CARE_PROVIDER_SITE_OTHER): Payer: Medicaid Other | Admitting: Endocrinology

## 2016-06-20 ENCOUNTER — Encounter: Payer: Self-pay | Admitting: Endocrinology

## 2016-06-20 VITALS — BP 110/60 | HR 81 | Ht 69.0 in | Wt 285.0 lb

## 2016-06-20 DIAGNOSIS — E1165 Type 2 diabetes mellitus with hyperglycemia: Secondary | ICD-10-CM | POA: Diagnosis not present

## 2016-06-20 DIAGNOSIS — I10 Essential (primary) hypertension: Secondary | ICD-10-CM

## 2016-06-20 DIAGNOSIS — Z794 Long term (current) use of insulin: Secondary | ICD-10-CM

## 2016-06-20 LAB — BASIC METABOLIC PANEL
BUN: 33 mg/dL — AB (ref 6–23)
CALCIUM: 10 mg/dL (ref 8.4–10.5)
CO2: 33 mEq/L — ABNORMAL HIGH (ref 19–32)
Chloride: 92 mEq/L — ABNORMAL LOW (ref 96–112)
Creatinine, Ser: 2.36 mg/dL — ABNORMAL HIGH (ref 0.40–1.50)
GFR: 30.59 mL/min — AB (ref >60.00)
Glucose, Bld: 236 mg/dL — ABNORMAL HIGH (ref 70–99)
POTASSIUM: 3.7 meq/L (ref 3.5–5.1)
SODIUM: 132 meq/L — AB (ref 135–145)

## 2016-06-20 LAB — HEMOGLOBIN A1C: HEMOGLOBIN A1C: 13.2 % — AB (ref 4.6–6.5)

## 2016-06-20 NOTE — Patient Instructions (Addendum)
30 unit pump  Bolus 4 click at supper  Check sugar 3-4x daily, add sugar checks 2 hrs after eating  Click 10-17 min before eating

## 2016-06-20 NOTE — Progress Notes (Signed)
Patient ID: Ethan Campbell, male   DOB: [birth year], 55 y.o.   MRN: [id number]   Reason for Appointment: Diabetes follow-up   History of Present Illness   Diagnosis: Type 2 DIABETES MELITUS, date of diagnosis: 2000    Previous history: he has been on insulin for several years with consistently poor control Has been requiring large doses of insulin for his diabetes but A1c has been persistently high Blood sugars did not improve significantly even with trying Byetta and Victoza In 2014 he was switched from NovoLog to U-500 insulin but not clear if he has had improvement in control except with fasting readings His prior A1c was 14.0 in 5/14 and he had educational discussions with diabetes educator and dietitian in 6/14   Recent history:  Insulin regimen:  Humulin R U-500 with V-go pump, 20 units basal. Boluses 3 units per meal    He was started on the V-go pump in 2/17 because of marked variability in blood sugars and overall high readings persistently On his follow-up in 8/17 he was having relatively low sugars waking up and he was switched back to the 20 unit V-go pump  A1c still high at 10.4 in August  Current management, blood sugar patterns and problems identified:  He is still having labile and inconsistent blood sugars  He thinks some of this may be related to having recurrent respiratory infections over the last 6 weeks  FASTING blood sugars are mostly higher recently although on the fifth and sixth of this month he had readings as low as 41  He does check some readings in the afternoon but inconsistently and these appear to be mostly high  More recently has not done readings within 4-6 hours after his evening meal and previously has had 3 readings over 400  Overnight blood sugars are mostly high, checking between 1 AM-5 AM and averaging over 300  He does try to take extra 4 units for high blood sugars even overnight and this may sometimes bring his sugars back  down  However overall checking blood sugars somewhat sporadically and mostly once a day late morning or midday  HYPOGLYCEMIA: Less recently, only has had 3 episodes about 3-4 weeks ago between 8 AM-10 AM    Mealtimes: 12 noon, 6 pm  Proper timing of medications in relation to meals: Yes, usually 30 minutes before eating .          Monitors blood glucose:  1-2 times a day.    Glucometer:  Accu-Chek     Blood Glucose readings from download:  Mean values apply above for all meters except median for One Touch  PRE-MEAL Fasting Lunch Dinner Overnight  Overall  Glucose range: 41-453    205-508    Mean/median:    338     POST-MEAL PC Breakfast PC Lunch PC Dinner  Glucose range:  250-484  396-458   Mean/median:       Physical activity: exercise: Unable to do Any now   Last dietitian visit: 6/14 and last CDE visit in 4/16  Wt Readings from Last 3 Encounters:  06/20/16 285 lb (129.3 kg)  04/19/16 (!) 304 lb 6.4 oz (138.1 kg)  03/07/16 (!) 305 lb (138.3 kg)    LABS:   Lab Results  Component Value Date   HGBA1C 10.4 (H) 03/07/2016   HGBA1C 11.4 Repeated and verified X2. (H) 11/20/2015   HGBA1C 12.1 08/13/2015   Lab Results  Component Value Date   MICROALBUR 0.7 03/07/2016  LDLCALC 83 10/13/2014   CREATININE 2.22 (H) 03/07/2016     other active problems discussed in review of systems     Medication List       Accurate as of 06/20/16  4:11 PM. Always use your most recent med list.          amLODipine 10 MG tablet Commonly known as:  NORVASC Take 10 mg by mouth daily.   atorvastatin 80 MG tablet Commonly known as:  LIPITOR TAKE 1 TABLET DAILY.   benazepril 20 MG tablet Commonly known as:  LOTENSIN Take 20 mg by mouth daily.   ciprofloxacin 500 MG tablet Commonly known as:  CIPRO Take 500 mg by mouth 2 (two) times daily.   enoxaparin 150 MG/ML injection Commonly known as:  LOVENOX Inject 140 mg into the skin.   fenofibrate 145 MG tablet Commonly  known as:  TRICOR Take 145 mg by mouth daily.   FLUoxetine 20 MG capsule Commonly known as:  PROZAC Take 20 mg by mouth daily.   furosemide 80 MG tablet Commonly known as:  LASIX Take by mouth 2 (two) times daily. Takes one tablet in the morning and one-half tablet in the evening   glucose blood test strip Commonly known as:  ACCU-CHEK AVIVA PLUS TEST UP TO 4 TIMES DAILY Dx code E11.65   HUMULIN R 500 UNIT/ML injection Generic drug:  insulin regular human CONCENTRATED USE AS DIRECTED, UP TO 35 UNITS 3 TIMES A DAY.   HYDROcodone-acetaminophen 10-325 MG tablet Commonly known as:  NORCO Take 1 tablet by mouth every 6 (six) hours as needed for moderate pain or severe pain. Reported on 01/22/2016   Insulin Syringe-Needle U-100 27G X 1/2" 1 ML Misc Use to fill V-go pump daily   potassium chloride 10 MEQ tablet Commonly known as:  K-DUR TAKE 2 TABLETS BY MOUTH ONCE DAILY.   tamsulosin 0.4 MG Caps capsule Commonly known as:  FLOMAX Take 0.4 mg by mouth.   tiZANidine 4 MG tablet Commonly known as:  ZANAFLEX Take 4 mg by mouth 2 (two) times daily.   V-GO 20 Kit by Does not apply route.   V-GO 30 Kit Use one per day   VICTOZA 18 MG/3ML Sopn Generic drug:  liraglutide USE 1.8 MG SUB-Q EACH DAY.   Vitamin D (Ergocalciferol) 50000 units Caps capsule Commonly known as:  DRISDOL TAKE 1 CAPSULE TWICE A WEEK   warfarin 7.5 MG tablet Commonly known as:  COUMADIN Take 10 mg by mouth.       Allergies: No Known Allergies  Past Medical History:  Diagnosis Date  . Essential hypertension   . History of stroke   . Hyperlipidemia   . Insomnia   . Obesity   . Precordial pain June 2011   Nuclear stress; no ischemia; EF 60%  . Proteinuria   . Sleep apnea    Dr. Tesfaye  . Type 2 diabetes mellitus ([facility])   . Venous insufficiency     Past Surgical History:  Procedure Laterality Date  . RESECTION BONE TUMOR FEMUR  1980's   Left femur, treated at [facility] with bone graft  .  TOTAL KNEE ARTHROPLASTY Left 2013    Family History  Problem Relation Age of Onset  . Heart attack Mother 67  . Breast cancer Mother   . Stroke Mother   . Heart attack Father 54  . Breast cancer Sister   . Colon cancer Sister     Social History:  reports that he has quit   smoking. His smoking use included Cigarettes. He has never used smokeless tobacco. He reports that he does not drink alcohol or use drugs.  Review of Systems:  Hypothyroid, treated by PCP, on 75ug daily but this is not revealed on his medication list  Lab Results  Component Value Date   TSH 1.77 04/19/2016     Currently off midodrine  for his orthostatic HYPOTENSION for 3 weeks, He stopped this on his own He  does not think he is having as much orthostatic lightheadedness causing him to fall Cardiologist  recommended compression stockings, has not been able to get these He has been followed by PCP and various other physicians However he continues to be treated with Lotensin 5 mg daily, not clear if he is taking amlodipine   Lipids: triglycerides have been relatively high despite taking fenofibrate. LDL is below 100, taking Lipitor   Lab Results  Component Value Date   CHOL 147 03/07/2016   HDL 31.90 (L) 03/07/2016   LDLCALC 83 10/13/2014   LDLDIRECT 80.0 03/07/2016   TRIG 251.0 (H) 03/07/2016   CHOLHDL 5 03/07/2016      CKD: His creatinine has been Consistently high and Followed by nephrologist   Lab Results  Component Value Date   CREATININE 2.22 (H) 03/07/2016   He has been told to have CHF In the past and is taking Lasix 80--40  daily along with potassium    Examination:   BP 110/60   Pulse 81   Ht 5' 9" (1.753 m)   Wt 285 lb (129.3 kg)   SpO2 97%   BMI 42.09 kg/m   Body mass index is 42.09 kg/m.     ASSESSMENT/ PLAN:   Diabetes type 2 with obesity and poor control See history of present illness for detailed discussion of current diabetes management, blood sugar patterns and  problems identified Her blood sugar readings are still very labile and inconsistent A1c needs to be rechecked today He appears to be having higher readings recently at most times partly from having respiratory infection even without any steroids Most likely needs a higher basal rate since most fasting readings and overnight readings appear to be high Not clear if he is having higher readings after his evening bolus as he often does not check these readings  Recommendations:  He will try to check his blood sugars more consistently 3 times a day  He will need to add another 2 units bolus for his evening meal and more if postprandial readings are high  He will use the 30 unit basal instead of 20, he does have some samples and if his blood sugars do not get low he will call for prescription for the 30 unit.  Check sugars consistently at suppertime and take correction doses for high readings at that time  Check A1c today  Also consider stopping the insulin pump and using the U-500 in injectable form  ORTHOSTATIC hypotension: improved    CKD: Followed by nephrologist   Patient Instructions  30 unit pump  Bolus 4 click at supper  Check sugar 3-4x daily, add sugar checks 2 hrs after eating  Click 30-40 min before eating    Counseling time on subjects discussed above is over 50% of today's 25 minute visit    KUMAR,AJAY 06/20/2016, 4:11 PM         

## 2016-06-21 NOTE — Progress Notes (Signed)
Please let patient know that the A1c is higher at 13, needs to check sugars more frequently and let us know how blood sugars are doing with the 30 unit basal

## 2016-07-13 ENCOUNTER — Telehealth: Payer: Self-pay | Admitting: Nutrition

## 2016-07-13 NOTE — Telephone Encounter (Signed)
Pt. Phoned [facility].  No answer, and no answering machine.

## 2016-08-04 ENCOUNTER — Ambulatory Visit: Payer: Medicaid Other | Admitting: Endocrinology

## 2016-08-24 ENCOUNTER — Other Ambulatory Visit: Payer: Self-pay | Admitting: Endocrinology

## 2016-08-31 ENCOUNTER — Other Ambulatory Visit: Payer: Self-pay | Admitting: Endocrinology

## 2016-09-09 ENCOUNTER — Other Ambulatory Visit: Payer: Self-pay | Admitting: Endocrinology

## 2016-09-09 ENCOUNTER — Other Ambulatory Visit: Payer: Self-pay

## 2016-09-09 ENCOUNTER — Telehealth: Payer: Self-pay | Admitting: Endocrinology

## 2016-09-09 MED ORDER — EXENATIDE ER 2 MG/0.85ML ~~LOC~~ AUIJ: 2.0000 mg | AUTO-INJECTOR | SUBCUTANEOUS | 1 refills | Status: DC

## 2016-09-09 MED ORDER — EXENATIDE ER 2 MG/0.85ML ~~LOC~~ AUIJ: 2.0000 mg | AUTO-INJECTOR | SUBCUTANEOUS | 2 refills | Status: DC

## 2016-09-09 NOTE — Telephone Encounter (Signed)
Patient has been called in [facility].  Review of chart indicates that he has renal failure. Will need to cancel this prescription and get prior authorization for Victoza stating contraindication to [facility]

## 2016-09-12 NOTE — Telephone Encounter (Signed)
Spoke with [facility] cancelled the bydureon rx per note, pharmacist states that there is an old victoza rx on file it just needs to be PA'd please.

## 2016-09-23 NOTE — Telephone Encounter (Signed)
Is being processed by [facility] should know for sure on Monday on the status

## 2016-09-30 ENCOUNTER — Other Ambulatory Visit: Payer: Self-pay

## 2016-09-30 MED ORDER — LIRAGLUTIDE 18 MG/3ML ~~LOC~~ SOPN: PEN_INJECTOR | SUBCUTANEOUS | 3 refills | Status: DC

## 2016-10-07 ENCOUNTER — Other Ambulatory Visit: Payer: Self-pay

## 2016-10-13 ENCOUNTER — Telehealth: Payer: Self-pay | Admitting: Endocrinology

## 2016-10-13 NOTE — Telephone Encounter (Signed)
pcp wants the pt to be on novolin r and latus can you please advise

## 2016-10-13 NOTE — Telephone Encounter (Signed)
He is overdue for his office visit, need to have him come in for review.  Also he is supposed to get Victoza authorized

## 2016-11-10 ENCOUNTER — Ambulatory Visit (INDEPENDENT_AMBULATORY_CARE_PROVIDER_SITE_OTHER): Payer: Medicaid Other | Admitting: Endocrinology

## 2016-11-10 ENCOUNTER — Encounter: Payer: Self-pay | Admitting: Endocrinology

## 2016-11-10 VITALS — BP 128/72 | HR 76 | Ht 69.0 in | Wt 285.0 lb

## 2016-11-10 DIAGNOSIS — Z794 Long term (current) use of insulin: Secondary | ICD-10-CM

## 2016-11-10 DIAGNOSIS — E1165 Type 2 diabetes mellitus with hyperglycemia: Secondary | ICD-10-CM | POA: Diagnosis not present

## 2016-11-10 LAB — POCT GLYCOSYLATED HEMOGLOBIN (HGB A1C): Hemoglobin A1C: 14.3

## 2016-11-10 MED ORDER — V-GO 30 KIT: PACK | 3 refills | Status: DC

## 2016-11-10 NOTE — Patient Instructions (Signed)
4 clicks for lunch and 6 clicks for dinner, must click 31-74  Min before meals  Sugar before meal is [phone] give 1 more click  [phone] give 2 clicks.  > 580  Do 4 clicks > 638 4 clicks Change pump every 24 hrs

## 2016-11-10 NOTE — Progress Notes (Signed)
Patient ID: Ethan Campbell, male   DOB: Dec 27, 1960, 56 y.o.   MRN: [id number]   Reason for Appointment: Diabetes follow-up   History of Present Illness   Diagnosis: Type 2 DIABETES MELITUS, date of diagnosis: 2000    Previous history: he has been on insulin for several years with consistently poor control Has been requiring large doses of insulin for his diabetes but A1c has been persistently high Blood sugars did not improve significantly even with trying Byetta and Victoza In 2014 he was switched from NovoLog to U-500 insulin but not clear if he has had improvement in control except with fasting readings His prior A1c was 14.0 in 5/14 and he had educational discussions with diabetes educator and dietitian in 6/14   Recent history:  Insulin regimen:  Humulin R U-500 with V-go pump, 30 units basal. Boluses 3 per meal   Non-insulin hypoglycemic drugs: Victoza 1.8 mg daily   He was started on the V-go pump in 2/17 because of marked variability in blood sugars and overall high readings persistently On his follow-up in 8/17 he was having relatively low sugars waking up and he was switched back to the 20 unit V-go pump  A1c has been progressively higher and now 14%, previously as low as 10.4  Current management, blood sugar patterns and problems identified:  He is now coming back after several months  His sugars are still labile and mostly high  FASTING blood sugars are mostly high and may be better only he takes her blood sugar and does a correction late at night.  However did have sugar of 51 on 1 morning without any testing the night before  He has only sporadic readings later in the day before or after lunch without any pattern but blood sugars probably are still mostly high  BEDTIME readings are consistently high but has only 3 readings recently  He is trying to eat relatively low fat meals especially in the evening but will have lunch meat for his  lunch  Sometimes will bolus after eating  He thinks he is changing his infusion pump every morning around the same time  Although he says he is using the 30 unit pump recently he has been getting the 20 unit supply from his pharmacy  Although he thinks he is taking his Victoza consistently this had to be reauthorized recently as it is not covered  HYPOGLYCEMIA: Sporadic, has only one low sugar documented recently at about 10 AM  Mealtimes: 1 pm, 6 pm  Proper timing of medications in relation to meals: Yes, usually 30 minutes before eating .          Monitors blood glucose:  1-2 times a day.    Glucometer:  Accu-Chek     Blood Glucose readings from download:  Mean values apply above for all meters except median for One Touch  PRE-MEAL Fasting Lunch Dinner Bedtime Overall  Glucose range:  51-560  198, 347  163  246-600    Mean/median: 250    398  326     Physical activity: exercise: Unable to do Any now   Last dietitian visit: 6/14 and last CDE visit in 4/16  Wt Readings from Last 3 Encounters:  11/10/16 285 lb (129.3 kg)  06/20/16 285 lb (129.3 kg)  04/19/16 (!) 304 lb 6.4 oz (138.1 kg)    LABS:   Lab Results  Component Value Date   HGBA1C 13.2 (H) 06/20/2016   HGBA1C 10.4 (H) 03/07/2016  HGBA1C 11.4 Repeated and verified X2. (H) 11/20/2015   Lab Results  Component Value Date   MICROALBUR 0.7 03/07/2016   LDLCALC 83 10/13/2014   CREATININE 2.36 (H) 06/20/2016     other active problems discussed in review of systems   Allergies as of 11/10/2016   No Known Allergies     Medication List       Accurate as of 11/10/16  2:15 PM. Always use your most recent med list.          amLODipine 10 MG tablet Commonly known as:  NORVASC Take 10 mg by mouth daily.   atorvastatin 80 MG tablet Commonly known as:  LIPITOR TAKE 1 TABLET DAILY.   benazepril 20 MG tablet Commonly known as:  LOTENSIN Take 20 mg by mouth daily.   ciprofloxacin 500 MG tablet Commonly  known as:  CIPRO Take 500 mg by mouth 2 (two) times daily.   enoxaparin 150 MG/ML injection Commonly known as:  LOVENOX Inject 140 mg into the skin.   fenofibrate 145 MG tablet Commonly known as:  TRICOR Take 145 mg by mouth daily.   FLUoxetine 20 MG capsule Commonly known as:  PROZAC Take 20 mg by mouth daily.   furosemide 80 MG tablet Commonly known as:  LASIX Take by mouth 2 (two) times daily. Takes one tablet in the morning and one-half tablet in the evening   glucose blood test strip Commonly known as:  ACCU-CHEK AVIVA PLUS TEST UP TO 4 TIMES DAILY Dx code E11.65   HUMULIN R 500 UNIT/ML injection Generic drug:  insulin regular human CONCENTRATED USE AS DIRECTED, UP TO 35 UNITS 3 TIMES A DAY.   HYDROcodone-acetaminophen 10-325 MG tablet Commonly known as:  NORCO Take 1 tablet by mouth every 6 (six) hours as needed for moderate pain or severe pain. Reported on 01/22/2016   Insulin Syringe-Needle U-100 27G X 1/2" 1 ML Misc Use to fill V-go pump daily   liraglutide 18 MG/3ML Sopn Commonly known as:  VICTOZA USE 1.8 MG SUB-Q EACH DAY.   potassium chloride 10 MEQ tablet Commonly known as:  K-DUR TAKE 2 TABLETS BY MOUTH ONCE DAILY.   tamsulosin 0.4 MG Caps capsule Commonly known as:  FLOMAX Take 0.4 mg by mouth.   tiZANidine 4 MG tablet Commonly known as:  ZANAFLEX Take 4 mg by mouth 2 (two) times daily.   V-GO 30 Kit Use one per day   Vitamin D (Ergocalciferol) 50000 units Caps capsule Commonly known as:  DRISDOL TAKE 1 CAPSULE TWICE A WEEK   warfarin 7.5 MG tablet Commonly known as:  COUMADIN Take 10 mg by mouth.       Allergies: No Known Allergies  Past Medical History:  Diagnosis Date  . Essential hypertension   . History of stroke   . Hyperlipidemia   . Insomnia   . Obesity   . Precordial pain June 2011   Nuclear stress; no ischemia; EF 60%  . Proteinuria   . Sleep apnea    Dr. Brandon Melnick  . Type 2 diabetes mellitus ([place])   . Venous  insufficiency     Past Surgical History:  Procedure Laterality Date  . RESECTION BONE TUMOR FEMUR  1980's   Left femur, treated at [facility] with bone graft  . TOTAL KNEE ARTHROPLASTY Left 2013    Family History  Problem Relation Age of Onset  . Heart attack Mother 70  . Breast cancer Mother   . Stroke Mother   . Heart attack  Father 8  . Breast cancer Sister   . Colon cancer Sister     Social History:  reports that he has quit smoking. His smoking use included Cigarettes. He has never used smokeless tobacco. He reports that he does not drink alcohol or use drugs.  Review of Systems:  Hypothyroid, treated by PCP, on 75ug daily And followed by PCP   Lab Results  Component Value Date   TSH 1.77 04/19/2016     He still has a weak spells with sometimes blackouts Has had a defibrillator implanted also   Lipids: triglycerides have been relatively high despite taking fenofibrate. LDL is below 100, taking Lipitor   Lab Results  Component Value Date   CHOL 147 03/07/2016   HDL 31.90 (L) 03/07/2016   LDLCALC 83 10/13/2014   LDLDIRECT 80.0 03/07/2016   TRIG 251.0 (H) 03/07/2016   CHOLHDL 5 03/07/2016      CKD: His creatinine has been Consistently high and Followed by nephrologist, Appears relatively stable   Lab Results  Component Value Date   CREATININE 2.36 (H) 06/20/2016   He has been told to have CHF In the past and is taking Lasix 80--40  daily along with potassium    Examination:   BP 128/72   Pulse 76   Ht '5\' 9"'  (1.753 m)   Wt 285 lb (129.3 kg)   BMI 42.09 kg/m   Body mass index is 42.09 kg/m.     ASSESSMENT/ PLAN:   Diabetes type 2 with obesity and poor control See history of present illness for detailed discussion of current diabetes management, blood sugar patterns and problems identified  Her blood sugar readings are still very Poorly controlled and overall worse This is despite taking higher doses of basal insulin with the 30 unit pump and  continuing the U-500 insulin  He is not monitoring blood sugars enough to know his blood sugar patterns but appears to be needing much more insulin overall Most likely needs more boluses for meals since fasting blood sugars can be rarely low without extra boluses at night. Also although he thinks he is using the 30 unit pump not sure if he is using some 20 units pumps   Recommendations:  He will try to check his blood sugars more consistently 3 times a day  He will need to add extra boluses when blood sugars are high before meals, needs to do this before every meal and will try to do this 30-60 minutes before eating  Discussed need to check more readings at night also to see if his postprandial readings are controlled  Consistently low fat diet  Take the boluses before eating consistently   CKD: Followed by nephrologist   Patient Instructions  4 clicks for lunch and 6 clicks for dinner, must click 41-58  Min before meals  Sugar before meal is 309-407 give 1 more click  680-881 give 2 clicks.  > 103  Do 4 clicks > 159 4 clicks Change pump every 24 hrs     Counseling time on subjects discussed above is over 50% of today's 25 minute visit    Ethan Campbell 11/10/2016, 2:15 PM

## 2016-11-10 NOTE — Addendum Note (Signed)
Addended by: Nile Riggs on: 11/10/2016 02:31 PM   Modules accepted: Orders

## 2016-12-20 ENCOUNTER — Other Ambulatory Visit: Payer: Medicaid Other

## 2016-12-20 ENCOUNTER — Other Ambulatory Visit: Payer: Self-pay

## 2016-12-21 ENCOUNTER — Other Ambulatory Visit: Payer: Medicaid Other

## 2016-12-22 ENCOUNTER — Telehealth: Payer: Self-pay

## 2016-12-22 ENCOUNTER — Other Ambulatory Visit: Payer: Self-pay | Admitting: Endocrinology

## 2016-12-22 ENCOUNTER — Encounter: Payer: Self-pay | Admitting: Endocrinology

## 2016-12-22 ENCOUNTER — Ambulatory Visit (INDEPENDENT_AMBULATORY_CARE_PROVIDER_SITE_OTHER): Payer: Medicaid Other | Admitting: Endocrinology

## 2016-12-22 VITALS — BP 130/75 | HR 89 | Ht 69.0 in | Wt 283.8 lb

## 2016-12-22 DIAGNOSIS — Z794 Long term (current) use of insulin: Secondary | ICD-10-CM | POA: Diagnosis not present

## 2016-12-22 DIAGNOSIS — E1165 Type 2 diabetes mellitus with hyperglycemia: Secondary | ICD-10-CM | POA: Diagnosis not present

## 2016-12-22 DIAGNOSIS — E063 Autoimmune thyroiditis: Secondary | ICD-10-CM

## 2016-12-22 DIAGNOSIS — E782 Mixed hyperlipidemia: Secondary | ICD-10-CM

## 2016-12-22 LAB — BASIC METABOLIC PANEL
BUN: 31 mg/dL — AB (ref 6–23)
CALCIUM: 9.2 mg/dL (ref 8.4–10.5)
CHLORIDE: 94 meq/L — AB (ref 96–112)
CO2: 29 meq/L (ref 19–32)
CREATININE: 2.94 mg/dL — AB (ref 0.40–1.50)
GFR: 23.69 mL/min — ABNORMAL LOW (ref >60.00)
Glucose, Bld: 565 mg/dL (ref 70–99)
Potassium: 4.1 mEq/L (ref 3.5–5.1)
Sodium: 129 mEq/L — ABNORMAL LOW (ref 135–145)

## 2016-12-22 NOTE — Telephone Encounter (Signed)
Lab called and had a critical lab:  Glucose 565  Wanted you aware. Thank you!

## 2016-12-22 NOTE — Progress Notes (Signed)
Patient ID: Ethan Campbell, male   DOB: [birth year], 56 y.o.   MRN: [id number]   Reason for Appointment: Diabetes follow-up   History of Present Illness   Diagnosis: Type 2 DIABETES MELITUS, date of diagnosis: 2000    Previous history: he has been on insulin for several years with consistently poor control Has been requiring large doses of insulin for his diabetes but A1c has been persistently high Blood sugars did not improve significantly even with trying Byetta and Victoza In 2014 he was switched from NovoLog to U-500 insulin but not clear if he has had improvement in control except with fasting readings His prior A1c was 14.0 in 5/14 and he had educational discussions with diabetes educator and dietitian in 6/14   Recent history:  Insulin regimen:  Humulin R U-500 with V-go pump, 30 units basal. Boluses 3 clicks per meal   Non-insulin hypoglycemic drugs: Victoza 1.8 mg daily   He was started on the V-go pump in 2/17 because of marked variability in blood sugars and overall high readings persistently On his follow-up in 8/17 he was having relatively low sugars waking up and he was switched back to the 20 unit V-go pump  A1c has been progressively higher and last was 14 Victoza was restarted on his last visit  Current management, blood sugar patterns and problems identified:  His sugars are still labile although on an average better  Despite reminders he is checking his blood sugars infrequently and only on an average once a day  FASTING blood sugars are now mostly variable and occasionally low and unclear why  He does think that he changes his V-go pump but the same time every morning  LOWEST reading was 37 at 10 AM before breakfast last weekend  He says that he does not eat breakfast now and is outside being active  Despite this however his blood sugars are mostly averaging well over 300 in the afternoons until suppertime  Blood sugars later at night are  variable and not as high  He has occasionally woken up with a low sugar at 4 AM  Does not take insulin based on what he is eating or his pre-meal blood sugar at suppertime  Although he thinks he is trying to eat healthy had a very high fat breakfast today at a restaurant and he says his pump did not allow him to click for the meal  Hypoglycemia has been documented 4 times in the last month mostly before noon and once at 11 PM  Mealtimes: 1 pm, 6 pm  Proper timing of medications in relation to meals: Yes, usually 30 minutes before eating .          Monitors blood glucose:  1-2 times a day.    Glucometer:  Accu-Chek     Blood Glucose readings from download:  Mean values apply above for all meters except median for One Touch  PRE-MEAL Fasting Midday  Dinner Bedtime Overall  Glucose range:  37-434  78-5 43   2 23-600   53-3 45    Mean/median:     259+/-170     Physical activity: exercise:He is trying to mow the lawns   Last dietitian visit: 6/14 and last CDE visit in 4/16  Wt Readings from Last 3 Encounters:  12/22/16 283 lb 12.8 oz (128.7 kg)  11/10/16 285 lb (129.3 kg)  06/20/16 285 lb (129.3 kg)    LABS:   Lab Results  Component Value Date  HGBA1C 14.3 11/10/2016   HGBA1C 13.2 (H) 06/20/2016   HGBA1C 10.4 (H) 03/07/2016   Lab Results  Component Value Date   MICROALBUR 0.7 03/07/2016   LDLCALC 83 10/13/2014   CREATININE 2.94 (H) 12/22/2016     other active problems discussed in review of systems   Allergies as of 12/22/2016   No Known Allergies     Medication List       Accurate as of 12/22/16  9:31 PM. Always use your most recent med list.          amLODipine 10 MG tablet Commonly known as:  NORVASC Take 10 mg by mouth daily.   atorvastatin 80 MG tablet Commonly known as:  LIPITOR TAKE 1 TABLET DAILY.   benazepril 20 MG tablet Commonly known as:  LOTENSIN Take 20 mg by mouth daily.   ciprofloxacin 500 MG tablet Commonly known as:  CIPRO Take  500 mg by mouth 2 (two) times daily.   fenofibrate 145 MG tablet Commonly known as:  TRICOR Take 145 mg by mouth daily.   FLUoxetine 20 MG capsule Commonly known as:  PROZAC Take 20 mg by mouth daily.   furosemide 80 MG tablet Commonly known as:  LASIX Take by mouth 2 (two) times daily. Takes one tablet in the morning and one-half tablet in the evening   glucose blood test strip Commonly known as:  ACCU-CHEK AVIVA PLUS TEST UP TO 4 TIMES DAILY Dx code E11.65   HUMULIN R 500 UNIT/ML injection Generic drug:  insulin regular human CONCENTRATED USE AS DIRECTED, UP TO 35 UNITS 3 TIMES A DAY.   HYDROcodone-acetaminophen 10-325 MG tablet Commonly known as:  NORCO Take 1 tablet by mouth every 6 (six) hours as needed for moderate pain or severe pain. Reported on 01/22/2016   Insulin Syringe-Needle U-100 27G X 1/2" 1 ML Misc Use to fill V-go pump daily   levothyroxine 75 MCG tablet Commonly known as:  SYNTHROID, LEVOTHROID Take by mouth.   liraglutide 18 MG/3ML Sopn Commonly known as:  VICTOZA USE 1.8 MG SUB-Q EACH DAY.   potassium chloride 10 MEQ tablet Commonly known as:  K-DUR TAKE 2 TABLETS BY MOUTH ONCE DAILY.   tamsulosin 0.4 MG Caps capsule Commonly known as:  FLOMAX Take 0.4 mg by mouth.   tiZANidine 4 MG tablet Commonly known as:  ZANAFLEX Take 4 mg by mouth 2 (two) times daily.   V-GO 30 Kit Use one per day   Vitamin D (Ergocalciferol) 50000 units Caps capsule Commonly known as:  DRISDOL TAKE 1 CAPSULE TWICE A WEEK   warfarin 10 MG tablet Commonly known as:  COUMADIN Take by mouth.   warfarin 7.5 MG tablet Commonly known as:  COUMADIN Take 10 mg by mouth.       Allergies: No Known Allergies  Past Medical History:  Diagnosis Date  . Essential hypertension   . History of stroke   . Hyperlipidemia   . Insomnia   . Obesity   . Precordial pain June 2011   Nuclear stress; no ischemia; EF 60%  . Proteinuria   . Sleep apnea    Dr. Brandon Melnick  .  Type 2 diabetes mellitus ([place])   . Venous insufficiency     Past Surgical History:  Procedure Laterality Date  . RESECTION BONE TUMOR FEMUR  1980's   Left femur, [facility] with [facility] graft  . TOTAL KNEE ARTHROPLASTY Left 2013    Family History  Problem Relation Age of Onset  .  Heart attack Mother 55  . Breast cancer Mother   . Stroke Mother   . Heart attack Father 62  . Breast cancer Sister   . Colon cancer Sister     Social History:  reports that he has quit smoking. His smoking use included Cigarettes. He has never used smokeless tobacco. He reports that he does not drink alcohol or use drugs.  Review of Systems:  Hypothyroid, treated by PCP, on 75ug daily And followed by PCP   Lab Results  Component Value Date   TSH 1.77 04/19/2016     He still has a weak spells with sometimes blackouts, about once or twice a month including recently when he hit his head His PCP has put him on midodrine However he is still on antihypertensive drugs  Has had a defibrillator implanted also, does not have a follow-up with cardiologist  Lipids: triglycerides have been relatively high despite taking fenofibrate. LDL is below 100, taking Lipitor   Lab Results  Component Value Date   CHOL 147 03/07/2016   HDL 31.90 (L) 03/07/2016   LDLCALC 83 10/13/2014   LDLDIRECT 80.0 03/07/2016   TRIG 251.0 (H) 03/07/2016   CHOLHDL 5 03/07/2016      CKD: His creatinine has been Consistently high and Followed by nephrologist,He was told that he is in stage IV kidney disease   Lab Results  Component Value Date   CREATININE 2.94 (H) 12/22/2016   He has been told to have CHF In the past and is taking Lasix 80 In the morning and 40 the evening   Examination:   BP (!) 143/85   Pulse 89   Ht '5\' 9"'  (1.753 m)   Wt 283 lb 12.8 oz (128.7 kg)   SpO2 96%   BMI 41.91 kg/m   Body mass index is 41.91 kg/m.   Standing blood pressure 130/75  ASSESSMENT/ PLAN:   Diabetes type 2 with  obesity and poor control See history of present illness for detailed discussion of current diabetes management, blood sugar patterns and problems identified  Her blood sugar readings are still very labile although on an average better He is still not checking his blood sugars as directed Most of his high readings are in the afternoon and may be because he does not take his bolus in the morning despite not eating and being active Blood sugars after supper are relatively better but overnight blood sugars are quite variable as above  His A1c has been typically very high  He appears to be a good candidate for a full fledged insulin pump His C-peptide in 2015 was 0.9 and he should be able to qualify   Recommendations:  He will try to check his blood sugars more consistently 3 times a day  He will need to regularly have breakfast in the morning and bolus  For now he can reduce to boluses to 2 clicks but it more for higher readings  Discussed timing and targets of blood sugars  If he still has low fasting readings will switch him to the 20 units pump  Will get C-peptide if needed but will proceed with trying to order his pump from [employer]   CKD: Followed by nephrologist, need more regular reports from them  History of syncope: Etiology unclear, no orthostasis present  Patient Instructions  Take 2 clicks in am and at supper  Check sugar 4 x daily        Counseling time on subjects discussed above is over 50%  of today's 25 minute visit    Kayliah Tindol 12/22/2016, 9:31 PM

## 2016-12-22 NOTE — Telephone Encounter (Signed)
This was expected

## 2016-12-22 NOTE — Patient Instructions (Addendum)
Take 2 clicks in am and at supper  Check sugar 4 x daily

## 2016-12-23 LAB — FRUCTOSAMINE: FRUCTOSAMINE: 494 umol/L — AB (ref 0–285)

## 2016-12-28 ENCOUNTER — Other Ambulatory Visit: Payer: Self-pay

## 2016-12-28 MED ORDER — V-GO 30 KIT: PACK | 3 refills | Status: DC

## 2017-01-12 ENCOUNTER — Encounter: Payer: Self-pay | Admitting: Dietician

## 2017-01-12 ENCOUNTER — Encounter: Payer: Medicaid Other | Attending: Endocrinology | Admitting: Dietician

## 2017-01-12 DIAGNOSIS — E1122 Type 2 diabetes mellitus with diabetic chronic kidney disease: Secondary | ICD-10-CM | POA: Diagnosis not present

## 2017-01-12 DIAGNOSIS — E1165 Type 2 diabetes mellitus with hyperglycemia: Secondary | ICD-10-CM

## 2017-01-12 DIAGNOSIS — Z713 Dietary counseling and surveillance: Secondary | ICD-10-CM | POA: Insufficient documentation

## 2017-01-12 DIAGNOSIS — I130 Hypertensive heart and chronic kidney disease with heart failure and stage 1 through stage 4 chronic kidney disease, or unspecified chronic kidney disease: Secondary | ICD-10-CM | POA: Diagnosis present

## 2017-01-12 DIAGNOSIS — N184 Chronic kidney disease, stage 4 (severe): Secondary | ICD-10-CM | POA: Insufficient documentation

## 2017-01-12 DIAGNOSIS — I509 Heart failure, unspecified: Secondary | ICD-10-CM | POA: Diagnosis not present

## 2017-01-12 DIAGNOSIS — E118 Type 2 diabetes mellitus with unspecified complications: Secondary | ICD-10-CM

## 2017-01-12 DIAGNOSIS — IMO0002 Reserved for concepts with insufficient information to code with codable children: Secondary | ICD-10-CM

## 2017-01-12 DIAGNOSIS — Z794 Long term (current) use of insulin: Secondary | ICD-10-CM

## 2017-01-12 NOTE — Patient Instructions (Signed)
Avoid dark soda. Increase your non-starchy vegetable intake. Avoid added salt, rinse canned vegetables.  Be cautious about eating out because of the salt content. Keep an active lifestyle.  Consider eating a small breakfast. Aim for 4 carbohydrate servings (60 grams) per meal. Continue to take your medication as prescribed. Continue to check your blood sugar as recommended.

## 2017-01-12 NOTE — Progress Notes (Signed)
Diabetes Self-Management Education  Visit Type: (P) First/Initial  Appt. Start Time: 1315 Appt. End Time: 1430  01/12/2017  Mr. Ethan Campbell, identified by name and date of birth, is a 56 y.o. male with a diagnosis of Diabetes: (P) Type 2. His last C-peptide was 0.9% in 2015 and MD is currently working to get him an insulin pump.  He is currently using a VGo 30 with Humilin R Z001 and uses 3 clicks per meal.  Other hx includes OSA and just got Bi-pap, CHF per patient on lasix, HDL, HTN, and stage IV CKD.  Weight hx: Patient reported that his weight was 600 lbs in 1980.  He lost by walking.  He would walk 15 miles to work and back each day. Weight 2 years ago was 386 lbs continued to lose with walking (and uncontrolled blood sugar). Today:  284 lbs.  Patient lives with his girlfriend.  He is on disability and makes extra money by cleaning land currently.  He gets $15 in food stamps per month and visits a food bank and gets free meals at times.  ASSESSMENT  Height 5\' 9"  (1.753 m), weight 284 lb (128.8 kg). Body mass index is 41.94 kg/m.      Diabetes Self-Management Education - 01/12/17 1343      Visit Information   Visit Type (P)  First/Initial     Initial Visit   Diabetes Type (P)  Type 2   Are you currently following a meal plan? (P)  No   Are you taking your medications as prescribed? (P)  Yes   Date Diagnosed (P)  2000     Health Coping   How would you rate your overall health? (P)  Fair     Psychosocial Assessment   Patient Belief/Attitude about Diabetes (P)  Motivated to manage diabetes   Self-care barriers (P)  None   Self-management support (P)  Doctor's office;Friends   Other persons present (P)  Patient   Patient Concerns (P)  Nutrition/Meal planning;Glycemic Control;Weight Control   Special Needs (P)  None   Preferred Learning Style (P)  No preference indicated   Learning Readiness (P)  Ready   How often do you need to have someone help you when you read  instructions, pamphlets, or other written materials from your doctor or pharmacy? (P)  1 - Never   What is the last grade level you completed in school? (P)  9th grade     Pre-Education Assessment   Patient understands the diabetes disease and treatment process. (P)  Needs Review   Patient understands incorporating nutritional management into lifestyle. (P)  Needs Review   Patient undertands incorporating physical activity into lifestyle. (P)  Needs Review   Patient understands using medications safely. (P)  Needs Review   Patient understands monitoring blood glucose, interpreting and using results (P)  Needs Review   Patient understands prevention, detection, and treatment of acute complications. (P)  Needs Review   Patient understands prevention, detection, and treatment of chronic complications. (P)  Needs Review   Patient understands how to develop strategies to address psychosocial issues. (P)  Needs Review   Patient understands how to develop strategies to promote health/change behavior. (P)  Needs Review     Complications   Last HgB A1C per patient/outside source (P)  14 %  12/05/16   How often do you check your blood sugar? (P)  3-4 times/day   Fasting Blood glucose range (mg/dL) (P)  <70;70-129;130-179;180-200;>200  36 this am  Postprandial Blood glucose range (mg/dL) (P)  >200   Number of hypoglycemic episodes per month (P)  4   Can you tell when your blood sugar is low? (P)  Yes   What do you do if your blood sugar is low? (P)  3 glucose tabes then eats when sugar normal   Number of hyperglycemic episodes per week (P)  14   Can you tell when your blood sugar is high? (P)  Yes   What do you do if your blood sugar is high? (P)  rest   Have you had a dilated eye exam in the past 12 months? (P)  Yes   Have you had a dental exam in the past 12 months? (P)  No   Are you checking your feet? (P)  Yes   How many days per week are you checking your feet? (P)  7     Dietary Intake    Breakfast (P)  Usually SKIPS but ate a peanut butter and jelly sandwich when blood sugar dropped to 36.   Snack (morning) (P)  none   Lunch (P)  bologna or ham and cheese on white OR grilled sandwich chips  2   Snack (afternoon) (P)  none   Dinner (P)  baked chicken or fish or fried chicken, non starchy vegetables.   Snack (evening) (P)  crackers with peanut butter but skipped last night because he was not hungry   Beverage(s) (P)  diet coke, water, 2% milk, pedialite, coffee (black), plain green tea     Exercise   Exercise Type (P)  Moderate (swimming / aerobic walking)  clearing land for someone   How many days per week to you exercise? (P)  4  but stopped recently due to increased seizures   How many minutes per day do you exercise? (P)  120   Total minutes per week of exercise (P)  480     Patient Education   Previous Diabetes Education (P)  Yes (please comment)  about V-go and others over several years     Subsequent Visit   Since your last visit have you continued or begun to take your medications as prescribed? (P)  Yes   Since your last visit have you experienced any weight changes? (P)  Loss   Since your last visit, are you checking your blood glucose at least once a day? (P)  Yes      Individualized Plan for Diabetes Self-Management Training:   Learning Objective:  Patient will have a greater understanding of diabetes self-management. Patient education plan is to attend individual and/or group sessions per assessed needs and concerns.  Discussed carbohydrate counting and consistency. Plan:   Patient Instructions  Avoid dark soda. Increase your non-starchy vegetable intake. Avoid added salt, rinse canned vegetables.  Be cautious about eating out because of the salt content. Keep an active lifestyle.  Consider eating a small breakfast. Aim for 4 carbohydrate servings (60 grams) per meal. Continue to take your medication as prescribed. Continue to check your blood  sugar as recommended.   Expected Outcomes:   patient showed interest in learning.  Positive outcome expected.  Education material provided: Meal plan card, My Plate and Snack sheet  If problems or questions, patient to contact team via:  Phone  Future DSME appointment:  prn

## 2017-02-03 ENCOUNTER — Encounter: Payer: Self-pay | Admitting: Endocrinology

## 2017-02-03 ENCOUNTER — Ambulatory Visit (INDEPENDENT_AMBULATORY_CARE_PROVIDER_SITE_OTHER): Payer: Medicaid Other | Admitting: Endocrinology

## 2017-02-03 VITALS — BP 130/76 | HR 86 | Ht 69.0 in | Wt 284.0 lb

## 2017-02-03 DIAGNOSIS — E1165 Type 2 diabetes mellitus with hyperglycemia: Secondary | ICD-10-CM | POA: Diagnosis not present

## 2017-02-03 DIAGNOSIS — Z794 Long term (current) use of insulin: Secondary | ICD-10-CM | POA: Diagnosis not present

## 2017-02-03 NOTE — Progress Notes (Signed)
Patient ID: Ethan Campbell, male   DOB: 01-28-1961, 56 y.o.   MRN: [id number]   Reason for Appointment: Diabetes follow-up   History of Present Illness   Diagnosis: Type 2 DIABETES MELITUS, date of diagnosis: 2000    Previous history: he has been on insulin for several years with consistently poor control Has been requiring large doses of insulin for his diabetes but A1c has been persistently high Blood sugars did not improve significantly even with trying Byetta and Victoza In 2014 he was switched from NovoLog to U-500 insulin but not clear if he has had improvement in control except with fasting readings His prior A1c was 14.0 in 5/14 and he had educational discussions with diabetes educator and dietitian in 6/14   Recent history:  Insulin regimen:  Humulin R U-500 with V-go pump, 30 units basal. Boluses 3-4 clicks per meal   Non-insulin hypoglycemic drugs: Victoza 1.8 mg daily   He was started on the V-go pump in 2/17 because of marked variability in blood sugars and overall high readings persistently On his follow-up in 8/17 he was having relatively low sugars waking up and he was switched back to the 20 unit V-go pump  A1c has been progressively higher and last was 14.3  Current management, blood sugar patterns and problems identified:  He has been advised to check his sugars consistently 3-4 times a day but he is still doing them only once or twice a day on an average  He was also told to not take extra boluses more than about 4 units at bedtime but he is taking as much as 8 units at bedtime which is causing periodic overnight hypoglycemia  HIGHEST blood sugars are later at night and averaging 356 this may be 4-5 hours after eating  His sugars are still labile at most times especially in the morning hours; on an average blood sugars are slightly higher with a little less variability   Despite reminders he is checking his blood sugars infrequently and only on an  average once a day  FASTING blood sugars have been as low as 38 but also once over 500  He has not been as active recently because of his falls and near-syncopal episode  Although he thinks he is trying to take his boluses 30 minutes before eating not clear if he is doing this consistently  He has been seen by the dietitian but is still eating high-fat meals like bologna sandwiches; he had this today and took only 4 units and postprandial reading is well over 300 despite pre-meal reading being 185  Has been unable to get paperwork through with the insulin pump company and not clear why; also has not been able to get repeat C-peptide level done because his fasting blood sugar has been 270 on his last couple of visits  Mealtimes: 1 pm, 6 pm  Proper timing of medications in relation to meals: Yes, usually 30 minutes before eating .          Monitors blood glucose:  1-2 times a day.    Glucometer:  Accu-Chek     Blood Glucose readings from download:  Mean values apply above for all meters except median for One Touch  PRE-MEAL Fasting Lunch Dinner Bedtime Overall  Glucose range: 38-500+   177-500+  55-500+    Mean/median: 225   252  356  270+/-155     Physical activity: exercise:none   Last dietitian visit: 6/14 and last CDE visit  in 4/16  Wt Readings from Last 3 Encounters:  02/03/17 284 lb (128.8 kg)  01/12/17 284 lb (128.8 kg)  12/22/16 283 lb 12.8 oz (128.7 kg)    LABS:   Lab Results  Component Value Date   HGBA1C 14.3 11/10/2016   HGBA1C 13.2 (H) 06/20/2016   HGBA1C 10.4 (H) 03/07/2016   Lab Results  Component Value Date   MICROALBUR 0.7 03/07/2016   LDLCALC 83 10/13/2014   CREATININE 2.94 (H) 12/22/2016     other active problems discussed in review of systems   Allergies as of 02/03/2017   No Known Allergies     Medication List       Accurate as of 02/03/17 11:59 PM. Always use your most recent med list.          amLODipine 10 MG tablet Commonly known  as:  NORVASC Take 10 mg by mouth daily.   atorvastatin 80 MG tablet Commonly known as:  LIPITOR TAKE 1 TABLET DAILY.   benazepril 20 MG tablet Commonly known as:  LOTENSIN Take 20 mg by mouth daily.   fenofibrate 145 MG tablet Commonly known as:  TRICOR Take 145 mg by mouth daily.   FLUoxetine 20 MG capsule Commonly known as:  PROZAC Take 20 mg by mouth daily.   furosemide 80 MG tablet Commonly known as:  LASIX Take by mouth 2 (two) times daily. Takes one tablet in the morning and one-half tablet in the evening   glucose blood test strip Commonly known as:  ACCU-CHEK AVIVA PLUS TEST UP TO 4 TIMES DAILY Dx code E11.65   HUMULIN R 500 UNIT/ML injection Generic drug:  insulin regular human CONCENTRATED USE AS DIRECTED, UP TO 35 UNITS 3 TIMES A DAY.   HYDROcodone-acetaminophen 10-325 MG tablet Commonly known as:  NORCO Take 1 tablet by mouth every 6 (six) hours as needed for moderate pain or severe pain. Reported on 01/22/2016   Insulin Syringe-Needle U-100 27G X 1/2" 1 ML Misc Use to fill V-go pump daily   levothyroxine 75 MCG tablet Commonly known as:  SYNTHROID, LEVOTHROID Take by mouth.   liraglutide 18 MG/3ML Sopn Commonly known as:  VICTOZA USE 1.8 MG SUB-Q EACH DAY.   meclizine 25 MG tablet Commonly known as:  ANTIVERT Take 25 mg by mouth 3 (three) times daily as needed for dizziness.   tamsulosin 0.4 MG Caps capsule Commonly known as:  FLOMAX Take 0.4 mg by mouth.   tiZANidine 4 MG tablet Commonly known as:  ZANAFLEX Take 4 mg by mouth 2 (two) times daily.   V-GO 30 Kit Use one per day   warfarin 10 MG tablet Commonly known as:  COUMADIN Take by mouth.   warfarin 7.5 MG tablet Commonly known as:  COUMADIN Take 10 mg by mouth.       Allergies: No Known Allergies  Past Medical History:  Diagnosis Date  . Essential hypertension   . History of stroke   . Hyperlipidemia   . Insomnia   . Obesity   . Precordial pain June 2011   Nuclear  stress; no ischemia; EF 60%  . Proteinuria   . Sleep apnea    Dr. Brandon Melnick  . Type 2 diabetes mellitus ([place])   . Venous insufficiency     Past Surgical History:  Procedure Laterality Date  . RESECTION BONE TUMOR FEMUR  1980's   Left femur, treated at [facility] with bone graft  . TOTAL KNEE ARTHROPLASTY Left 2013    Family History  Problem Relation Age of Onset  . Heart attack Mother 31  . Breast cancer Mother   . Stroke Mother   . Heart attack Father 66  . Breast cancer Sister   . Colon cancer Sister     Social History:  reports that he has quit smoking. His smoking use included Cigarettes. He has never used smokeless tobacco. He reports that he does not drink alcohol or use drugs.  Review of Systems:  Hypothyroid, treated by PCP, on 75ug daily And followed by PCP   Lab Results  Component Value Date   TSH 1.77 04/19/2016   He says his blood pressure has been dropping low on standing but he is continued on amlodipine and benazepril Also apparently taking midodrine from various physicians who are following his syncopal episodes   Lipids: triglycerides have been relatively high   Followed by PCP   Lab Results  Component Value Date   CHOL 147 03/07/2016   HDL 31.90 (L) 03/07/2016   LDLCALC 83 10/13/2014   LDLDIRECT 80.0 03/07/2016   TRIG 251.0 (H) 03/07/2016   CHOLHDL 5 03/07/2016      CKD: His creatinine has been In the 2-3 range No records from nephrologist available   Lab Results  Component Value Date   CREATININE 2.94 (H) 12/22/2016   He has been told to have CHF In the past and is taking Lasix 80 In the morning and 40 the evening   Examination:   BP 130/76   Pulse 86   Ht 5' 9" (1.753 m)   Wt 284 lb (128.8 kg)   SpO2 97%   BMI 41.94 kg/m   Body mass index is 41.94 kg/m.     ASSESSMENT/ PLAN:   Diabetes type 2 with obesity and poor control See history of present illness for detailed discussion of current diabetes management, blood sugar  patterns and problems identified  Her blood sugar readings are still very labile although on an average better He is still not checking his blood sugars as directed Most of his high readings are in the afternoon and may be because he does not take his bolus in the morning despite not eating and being active Blood sugars after supper are relatively better but overnight blood sugars are quite variable as above  His A1c has been typically very high  He appears to be a good candidate for a full fledged insulin pump His C-peptide in 2015 was 0.9 and he should be able to qualify   Recommendations:  He will try to check his blood sugars 2-3 times a day at least before meals and some at bedtime  He will need to increase his boluses to at least 3 clicks at breakfast and 4 clicks if eating higher fat meal her blood sugars high  He will increase his suppertime boluses 2 between 8-10 units in additional click for high readings before eating  He will also not take more than 4 units bolus for high reading late at night for postprandial hyperglycemia  Start eating low fat meals and avoid high-fat meats, pizza and high-fat dairy products  Discussed timing and targets of blood sugars  If he still has low fasting readings will switch him to the 20 units pump  Will get C-peptide done at his local lab but he will make sure if sugars under 200 when he goes   CKD: Followed by nephrologist  History of syncope: Etiology unclear, no orthostasis documented here and is being managed by PCP and  other physicians with midodrine now  Patient Instructions  Click 4-5 times at supper and no more than 2 times at nite for hi sugars  Click 1-6X at breakfast  Low fat meats     Counseling time on subjects discussed in assessment and plan sections is over 50% of today's 25 minute visit     KUMAR,AJAY 02/05/2017, 3:40 PM   Note: This office note was prepared with Estate agent.  Any transcriptional errors that result from this process are unintentional.

## 2017-02-03 NOTE — Patient Instructions (Addendum)
Click 4-5 times at supper and no more than 2 times at nite for hi sugars  Click 3-2G at breakfast  Low fat meats

## 2017-02-20 ENCOUNTER — Encounter: Payer: Self-pay | Admitting: Endocrinology

## 2017-02-20 ENCOUNTER — Telehealth: Payer: Self-pay | Admitting: Endocrinology

## 2017-02-20 NOTE — Telephone Encounter (Signed)
Waiting to see if Dr. Dwyane Dee wants to change to another medication

## 2017-02-20 NOTE — Telephone Encounter (Signed)
[employer] calling to get PA for  liraglutide (VICTOZA) 18 MG/3ML SOPN  Patient's key is [id number]

## 2017-02-22 NOTE — Telephone Encounter (Signed)
[employer] aware that Dr. Dwyane Dee has to advise.

## 2017-02-27 NOTE — Telephone Encounter (Signed)
PA had previously been done.  Please check with the pharmacy to see if they are having issues with coverage.  His insurance prefers Bydureon but this is contraindicated because of renal failure and this has been documented with them before in case PA as needed.  Thanks

## 2017-02-27 NOTE — Telephone Encounter (Signed)
Would you like to change from the Victoza or do the PA? Thanks!

## 2017-02-28 NOTE — Telephone Encounter (Signed)
I called the pharmacy and they stated that a PA was needed for medication because it was not covered by his insurance.

## 2017-03-01 NOTE — Telephone Encounter (Signed)
Please start the PA. His insurance prefers Bydureon but this is contraindicated because of renal failure and this has been documented with them before .  Thanks

## 2017-03-05 NOTE — Telephone Encounter (Signed)
Please check status.

## 2017-03-06 ENCOUNTER — Other Ambulatory Visit: Payer: Self-pay

## 2017-03-06 MED ORDER — LIRAGLUTIDE 18 MG/3ML ~~LOC~~ SOPN: PEN_INJECTOR | SUBCUTANEOUS | 3 refills | Status: DC

## 2017-03-06 NOTE — Telephone Encounter (Signed)
PA was approved for Victoza PA # is [id number] approved until 03/01/18- called and left a vm making patient aware of this and requested he call me back if he had any further questions

## 2017-03-22 ENCOUNTER — Other Ambulatory Visit: Payer: Self-pay | Admitting: Endocrinology

## 2017-03-22 DIAGNOSIS — E1165 Type 2 diabetes mellitus with hyperglycemia: Secondary | ICD-10-CM

## 2017-03-22 DIAGNOSIS — Z794 Long term (current) use of insulin: Secondary | ICD-10-CM

## 2017-04-06 ENCOUNTER — Encounter: Payer: Self-pay | Admitting: Endocrinology

## 2017-04-06 ENCOUNTER — Ambulatory Visit (INDEPENDENT_AMBULATORY_CARE_PROVIDER_SITE_OTHER): Payer: Medicaid Other | Admitting: Endocrinology

## 2017-04-06 VITALS — BP 122/64 | HR 78 | Ht 69.0 in | Wt 290.0 lb

## 2017-04-06 DIAGNOSIS — E1165 Type 2 diabetes mellitus with hyperglycemia: Secondary | ICD-10-CM

## 2017-04-06 DIAGNOSIS — I10 Essential (primary) hypertension: Secondary | ICD-10-CM

## 2017-04-06 DIAGNOSIS — Z794 Long term (current) use of insulin: Secondary | ICD-10-CM | POA: Diagnosis not present

## 2017-04-06 DIAGNOSIS — E1122 Type 2 diabetes mellitus with diabetic chronic kidney disease: Secondary | ICD-10-CM

## 2017-04-06 DIAGNOSIS — N184 Chronic kidney disease, stage 4 (severe): Secondary | ICD-10-CM | POA: Diagnosis not present

## 2017-04-06 LAB — POCT GLYCOSYLATED HEMOGLOBIN (HGB A1C): Hemoglobin A1C: 12.2

## 2017-04-06 NOTE — Patient Instructions (Addendum)
5 click at dinner even with normal sugars  Only 1 click at bedtime  Low fat meals  Take 1/2 of 10mg  amlodipine

## 2017-04-06 NOTE — Progress Notes (Signed)
Patient ID: Ethan Campbell, male   DOB: Jun 22, 1961, 56 y.o.   MRN: [id number]   Reason for Appointment: Diabetes follow-up   History of Present Illness   Diagnosis: Type 2 DIABETES MELITUS, date of diagnosis: 2000    Previous history: he has been on insulin for several years with consistently poor control Has been requiring large doses of insulin for his diabetes but A1c has been persistently high Blood sugars did not improve significantly even with trying Byetta and Victoza In 2014 he was switched from NovoLog to U-500 insulin but not clear if he has had improvement in control except with fasting readings His prior A1c was 14.0 in 5/14 and he had educational discussions with diabetes educator and dietitian in 6/14   Recent history:  Insulin regimen:  Humulin R U-500 with V-go pump, 30 units basal. Boluses 3 clicks per meal   Non-insulin hypoglycemic drugs: Victoza 1.8 mg daily   He was started on the V-go pump in 2/17 because of marked variability in blood sugars and overall high readings persistently On his follow-up in 8/17 he was having relatively low sugars waking up and he was switched back to the 20 unit V-go pump  A1c has been usually very significantly high although relatively better at 12.2 today   Current management, blood sugar patterns and problems identified:  He was instructed to take larger doses of insulin for his evening meal but is still taking only 3 clicks with this pump  He now says that he is eating mostly one meal a day in the evening and usually no meals during the morning or lunchtime  He has been seen by the dietitian and advised to moderate fats and carbohydrates but still eating high-fat meals like bologna sandwiches  HIGHEST blood sugars are later at night and almost always significantly high  He will take about 2 clicks at bedtime regardless of the blood sugar and usually morning sugars are better with this  However has sporadically  LOW blood sugars midday or afternoon  Still not checking his blood sugars and only about once a day on an average  Has been unable to get get repeat C-peptide level done because his fasting blood sugar has been high or his lab has not gone the testing  He was able to get Victoza again after prior authorization attempts  Mealtimes: 1 pm, 6 pm  Proper timing of medications in relation to meals: Yes, usually 30 minutes before eating .          Monitors blood glucose:  1-2 times a day.    Glucometer:  Accu-Chek     Blood Glucose readings from download:  Mean values apply above for all meters except median for One Touch  PRE-MEAL Fasting Lunch Dinner Bedtime Overall  Glucose range:  70-582   67-410  20 9-600 + 59-600   Mean/median:     286    PC supper 1 87-600   Physical activity: exercise:none   Last dietitian visit: 6/18 and last CDE visit in 4/16  Wt Readings from Last 3 Encounters:  04/06/17 290 lb (131.5 kg)  02/03/17 284 lb (128.8 kg)  01/12/17 284 lb (128.8 kg)    LABS:   Lab Results  Component Value Date   HGBA1C 12.2 04/06/2017   HGBA1C 14.3 11/10/2016   HGBA1C 13.2 (H) 06/20/2016   Lab Results  Component Value Date   MICROALBUR 0.7 03/07/2016   LDLCALC 83 10/13/2014   CREATININE 2.94 (H)  12/22/2016     other active problems discussed in review of systems   Allergies as of 04/06/2017   No Known Allergies     Medication List       Accurate as of 04/06/17  8:31 PM. Always use your most recent med list.          amLODipine 10 MG tablet Commonly known as:  NORVASC Take 10 mg by mouth daily.   atorvastatin 80 MG tablet Commonly known as:  LIPITOR TAKE 1 TABLET DAILY.   benazepril 20 MG tablet Commonly known as:  LOTENSIN Take 20 mg by mouth daily.   fenofibrate 145 MG tablet Commonly known as:  TRICOR Take 145 mg by mouth daily.   FLUoxetine 20 MG capsule Commonly known as:  PROZAC Take 20 mg by mouth daily.   furosemide 80 MG  tablet Commonly known as:  LASIX Take by mouth 2 (two) times daily. Takes one tablet in the morning and one-half tablet in the evening   glucose blood test strip Commonly known as:  ACCU-CHEK AVIVA PLUS TEST UP TO 4 TIMES DAILY Dx code E11.65   HUMULIN R 500 UNIT/ML injection Generic drug:  insulin regular human CONCENTRATED USE AS DIRECTED, UP TO 35 UNITS 3 TIMES A DAY.   HYDROcodone-acetaminophen 10-325 MG tablet Commonly known as:  NORCO Take 1 tablet by mouth every 6 (six) hours as needed for moderate pain or severe pain. Reported on 01/22/2016   Insulin Syringe-Needle U-100 27G X 1/2" 1 ML Misc Use to fill V-go pump daily   levothyroxine 75 MCG tablet Commonly known as:  SYNTHROID, LEVOTHROID Take by mouth.   liraglutide 18 MG/3ML Sopn Commonly known as:  VICTOZA USE 1.8 MG SUB-Q EACH DAY.   meclizine 25 MG tablet Commonly known as:  ANTIVERT Take 25 mg by mouth 3 (three) times daily as needed for dizziness.   tamsulosin 0.4 MG Caps capsule Commonly known as:  FLOMAX Take 0.4 mg by mouth.   tiZANidine 4 MG tablet Commonly known as:  ZANAFLEX Take 4 mg by mouth 2 (two) times daily.   V-GO 30 Kit Use one per day   warfarin 10 MG tablet Commonly known as:  COUMADIN Take by mouth.   warfarin 7.5 MG tablet Commonly known as:  COUMADIN Take 10 mg by mouth.            Discharge Care Instructions        Start     Ordered   04/06/17 0000  POCT glycosylated hemoglobin (Hb A1C)     04/06/17 1610      Allergies: No Known Allergies  Past Medical History:  Diagnosis Date  . Essential hypertension   . History of stroke   . Hyperlipidemia   . Insomnia   . Obesity   . Precordial pain June 2011   Nuclear stress; no ischemia; EF 60%  . Proteinuria   . Sleep apnea    Dr. Brandon Melnick  . Type 2 diabetes mellitus ([place])   . Venous insufficiency     Past Surgical History:  Procedure Laterality Date  . RESECTION BONE TUMOR FEMUR  1980's   Left femur,  [facility] at [facility] with bone graft  . TOTAL KNEE ARTHROPLASTY Left 2013    Family History  Problem Relation Age of Onset  . Heart attack Mother 14  . Breast cancer Mother   . Stroke Mother   . Heart attack Father 61  . Breast cancer Sister   . Colon cancer  Sister     Social History:  reports that he has quit smoking. His smoking use included Cigarettes. He has never used smokeless tobacco. He reports that he does not drink alcohol or use drugs.  Review of Systems:  Hypothyroid, treated by PCP, on 75ug daily And followed by PCP   Lab Results  Component Value Date   TSH 1.77 04/19/2016   He says his blood pressure has been dropping low on standing but he is continued on amlodipine and benazepril Also apparently taking midodrine from various physicians who are following his syncopal episodes   Lipids: triglycerides have been relatively high   Followed by PCP   Lab Results  Component Value Date   CHOL 147 03/07/2016   HDL 31.90 (L) 03/07/2016   LDLCALC 83 10/13/2014   LDLDIRECT 80.0 03/07/2016   TRIG 251.0 (H) 03/07/2016   CHOLHDL 5 03/07/2016      CKD: His creatinine has been In the 2-3 range No records from nephrologist available   Lab Results  Component Value Date   CREATININE 2.94 (H) 12/22/2016   He has been told to have CHF In the past and is taking Lasix 80 In the morning and 40 the evening   Examination:   BP 122/64   Pulse 78   Ht '5\' 9"'  (1.753 m)   Wt 290 lb (131.5 kg)   SpO2 96%   BMI 42.83 kg/m   Body mass index is 42.83 kg/m.     ASSESSMENT/ PLAN:   Diabetes type 2 with obesity and poor control See history of present illness for detailed discussion of current diabetes management, blood sugar patterns and problems identified  Her blood sugar readings are still Mostly high and only occasionally better in the morning especially with taking boluses at bedtime Recent average 286 with infrequent monitoring A1c: Better at 12.2  Not able to  cover his evening meal adequately Does not take more insulin even with significant postprandial hyperglycemia after his main meal in the evening Currently not very active also Not clear if he is benefiting from Victoza   Recommendations:  He will need to bolus at least 5 clicks for his evening meal since blood sugars are consistently high related evening and this may also help overnight blood sugars  Most likely may not need any boluses at bedtime at most 1 unit  Cut back on high-fat foods and fatty meats  He will need to take correction doses the morning if his blood sugars are high  Also cover all mealsand snacks with boluses  Will get C-peptide done at his local lab again to enable [place] to get his insulin pump   CKD: Followed by nephrologist and stable  History of syncope: Etiology unclear, no orthostasis confirm He feels lightheaded sometimes and not clear why his blood pressure medications have not been reduced For now will reduce his amlodipine to half a tablet especially since he has a tendency to edema and today blood pressure is low normal    Patient Instructions  5 click at dinner even with normal sugars  Only 1 click at bedtime  Low fat meals  Take 1/2 of 5m amlodipine   Counseling time on subjects discussed in assessment and plan sections is over 50% of today's 25 minute visit     Christien Berthelot 04/06/2017, 8:31 PM   Note: This office note was prepared with [employer] agent Any transcriptional errors that result from this process are unintentional.

## 2017-04-26 ENCOUNTER — Other Ambulatory Visit: Payer: Medicaid Other

## 2017-04-26 ENCOUNTER — Other Ambulatory Visit: Payer: Self-pay

## 2017-04-26 DIAGNOSIS — Z794 Long term (current) use of insulin: Secondary | ICD-10-CM

## 2017-04-26 DIAGNOSIS — E1165 Type 2 diabetes mellitus with hyperglycemia: Secondary | ICD-10-CM

## 2017-05-02 ENCOUNTER — Other Ambulatory Visit: Payer: Self-pay

## 2017-06-02 ENCOUNTER — Other Ambulatory Visit: Payer: Medicaid Other

## 2017-06-02 DIAGNOSIS — Z794 Long term (current) use of insulin: Secondary | ICD-10-CM

## 2017-06-02 DIAGNOSIS — E1165 Type 2 diabetes mellitus with hyperglycemia: Secondary | ICD-10-CM

## 2017-06-03 LAB — BASIC METABOLIC PANEL
BUN/Creatinine Ratio: 10 (ref 9–20)
BUN: 21 mg/dL (ref 6–24)
CALCIUM: 9.2 mg/dL (ref 8.7–10.2)
CHLORIDE: 97 mmol/L (ref 96–106)
CO2: 24 mmol/L (ref 20–29)
Creatinine, Ser: 2.02 mg/dL — ABNORMAL HIGH (ref 0.76–1.27)
GFR calc Af Amer: 41 mL/min/{1.73_m2} — ABNORMAL LOW (ref >59)
GFR, EST NON AFRICAN AMERICAN: 36 mL/min/{1.73_m2} — AB (ref >59)
GLUCOSE: 346 mg/dL — AB (ref 65–99)
Potassium: 4.1 mmol/L (ref 3.5–5.2)
SODIUM: 138 mmol/L (ref 134–144)

## 2017-06-03 LAB — HEMOGLOBIN A1C
ESTIMATED AVERAGE GLUCOSE: 341 mg/dL
HEMOGLOBIN A1C: 13.5 % — AB (ref 4.8–5.6)

## 2017-06-03 LAB — C-PEPTIDE: C PEPTIDE: 4.4 ng/mL (ref 1.1–4.4)

## 2017-06-07 ENCOUNTER — Encounter: Payer: Self-pay | Admitting: Endocrinology

## 2017-06-07 ENCOUNTER — Ambulatory Visit: Payer: Medicaid Other | Admitting: Endocrinology

## 2017-06-07 VITALS — BP 134/84 | HR 73 | Ht 69.0 in | Wt 295.6 lb

## 2017-06-07 DIAGNOSIS — Z794 Long term (current) use of insulin: Secondary | ICD-10-CM

## 2017-06-07 DIAGNOSIS — E1165 Type 2 diabetes mellitus with hyperglycemia: Secondary | ICD-10-CM

## 2017-06-07 NOTE — Patient Instructions (Signed)
If sugar is > 300 then check again 2 hrs later  Always check sugar at mealtimes and bedtime

## 2017-06-07 NOTE — Progress Notes (Signed)
Patient ID: Ethan Campbell, male   DOB: 05-14-1961, 56 y.o.   MRN: [id number]   Reason for Appointment: Diabetes follow-up   History of Present Illness   Diagnosis: Type 2 DIABETES MELITUS, date of diagnosis: 2000    Previous history: he has been on insulin for several years with consistently poor control Has been requiring large doses of insulin for his diabetes but A1c has been persistently high Blood sugars did not improve significantly even with trying Byetta and Victoza In 2014 he was switched from NovoLog to U-500 insulin but not clear if he has had improvement in control except with fasting readings His prior A1c was 14.0 in 5/14 and he had educational discussions with diabetes educator and dietitian in 6/14   Recent history:  Insulin regimen:  Humulin R U-500 with V-go pump, 30 units basal. Boluses 3 clicks lunch --5 clicks dinner  Non-insulin hypoglycemic drugs: Victoza 1.8 mg daily   He was started on the V-go pump in 2/17 because of marked variability in blood sugars and overall high readings persistently  A1c has been usually very significantly high and now 13.5   Current management, blood sugar patterns and problems identified:  He was instructed to take larger doses of insulin for his evening meal but is still having some high readings after evening meal  However he did have a relatively good reading of 145 last month at bedtime  Because of inadequate glucose monitoring difficult to identify any blood sugar patterns; his blood sugars are variable at all times of the day  He is forgetful checking blood sugars  Most of his blood sugars are around 10 AM or before his first meal and then sporadically later in the day  He does try to take extra 2 clicks for blood sugars over 382 and 4 clicks if they are over 400 but not clear if this is adequate as he does not usually do a follow-up reading  He says he is trying to eat healthy meals and more vegetables  and no fried food, his weight is trending higher however  Does not exercise for various reasons  Mealtimes: 12 pm, 6 pm  Proper timing of medications in relation to meals: Yes, usually 30 minutes before eating .          Monitors blood glucose:  1-2 times a day.    Glucometer:  Accu-Chek     Blood Glucose readings from download:  Mean values apply above for all meters except median for One Touch  PRE-MEAL Fasting Lunch Dinner Bedtime Overall  Glucose range:  43-457   61   59-481   1 45-477    Mean/median: 250     260    POST-MEAL PC Breakfast PC Lunch PC Dinner  Glucose range:  385, 338    Mean/median:        Physical activity: exercise:none   Last dietitian visit: 6/18 and last CDE visit in 4/16  Wt Readings from Last 3 Encounters:  06/07/17 295 lb 9.6 oz (134.1 kg)  04/06/17 290 lb (131.5 kg)  02/03/17 284 lb (128.8 kg)    LABS:   Lab Results  Component Value Date   HGBA1C 13.5 (H) 06/02/2017   HGBA1C 12.2 04/06/2017   HGBA1C 14.3 11/10/2016   Lab Results  Component Value Date   MICROALBUR 0.7 03/07/2016   LDLCALC 83 10/13/2014   CREATININE 2.02 (H) 04/26/2017     other active problems discussed in review of systems  Allergies as of 06/07/2017   No Known Allergies     Medication List        Accurate as of 06/07/17  4:47 PM. Always use your most recent med list.          amLODipine 10 MG tablet Commonly known as:  NORVASC Take 10 mg by mouth daily.   atorvastatin 80 MG tablet Commonly known as:  LIPITOR TAKE 1 TABLET DAILY.   benazepril 20 MG tablet Commonly known as:  LOTENSIN Take 20 mg by mouth daily.   fenofibrate 145 MG tablet Commonly known as:  TRICOR Take 145 mg by mouth daily.   FLUoxetine 20 MG capsule Commonly known as:  PROZAC Take 20 mg by mouth daily.   furosemide 80 MG tablet Commonly known as:  LASIX Take by mouth 2 (two) times daily. Takes one tablet in the morning and one-half tablet in the evening   glucose  blood test strip Commonly known as:  ACCU-CHEK AVIVA PLUS TEST UP TO 4 TIMES DAILY Dx code E11.65   HUMULIN R 500 UNIT/ML injection Generic drug:  insulin regular human CONCENTRATED USE AS DIRECTED, UP TO 35 UNITS 3 TIMES A DAY.   HYDROcodone-acetaminophen 10-325 MG tablet Commonly known as:  NORCO Take 1 tablet by mouth every 6 (six) hours as needed for moderate pain or severe pain. Reported on 01/22/2016   Insulin Syringe-Needle U-100 27G X 1/2" 1 ML Misc Use to fill V-go pump daily   levothyroxine 75 MCG tablet Commonly known as:  SYNTHROID, LEVOTHROID Take by mouth.   liraglutide 18 MG/3ML Sopn Commonly known as:  VICTOZA USE 1.8 MG SUB-Q EACH DAY.   meclizine 25 MG tablet Commonly known as:  ANTIVERT Take 25 mg by mouth 3 (three) times daily as needed for dizziness.   tamsulosin 0.4 MG Caps capsule Commonly known as:  FLOMAX Take 0.4 mg by mouth.   tiZANidine 4 MG tablet Commonly known as:  ZANAFLEX Take 4 mg by mouth 2 (two) times daily.   V-GO 30 Kit Use one per day   warfarin 10 MG tablet Commonly known as:  COUMADIN Take by mouth.   warfarin 7.5 MG tablet Commonly known as:  COUMADIN Take 10 mg by mouth.       Allergies: No Known Allergies  Past Medical History:  Diagnosis Date  . Essential hypertension   . History of stroke   . Hyperlipidemia   . Insomnia   . Obesity   . Precordial pain June 2011   Nuclear stress; no ischemia; EF 60%  . Proteinuria   . Sleep apnea    Dr. Brandon Melnick  . Type 2 diabetes mellitus ([place])   . Venous insufficiency     Past Surgical History:  Procedure Laterality Date  . RESECTION BONE TUMOR FEMUR  1980's   Left femur, [facility] at [facility] with bone graft  . TOTAL KNEE ARTHROPLASTY Left 2013    Family History  Problem Relation Age of Onset  . Heart attack Mother 7  . Breast cancer Mother   . Stroke Mother   . Heart attack Father 20  . Breast cancer Sister   . Colon cancer Sister     Social History:  reports  that he has quit smoking. His smoking use included cigarettes. he has never used smokeless tobacco. He reports that he does not drink alcohol or use drugs.  Review of Systems:  Hypothyroid, treated by PCP, on 75ug daily And followed by PCP   Lab Results  Component Value Date   TSH 1.77 04/19/2016   He says his blood pressure has been dropping low on standing but he is continued on amlodipine and benazepril Also apparently taking midodrine from various physicians who are following his syncopal episodes   Lipids: triglycerides have been relatively high   Followed by PCP   Lab Results  Component Value Date   CHOL 147 03/07/2016   HDL 31.90 (L) 03/07/2016   LDLCALC 83 10/13/2014   LDLDIRECT 80.0 03/07/2016   TRIG 251.0 (H) 03/07/2016   CHOLHDL 5 03/07/2016      CKD: His creatinine has been In the 2-3 range No records from nephrologist available   Lab Results  Component Value Date   CREATININE 2.02 (H) 04/26/2017   He has been told to have CHF In the past and is taking Lasix 80 In the morning and 40 the evening   Examination:   BP 134/84   Pulse 73   Ht '5\' 9"'  (1.753 m)   Wt 295 lb 9.6 oz (134.1 kg)   SpO2 98%   BMI 43.65 kg/m   Body mass index is 43.65 kg/m.     ASSESSMENT/ PLAN:   Diabetes type 2 with obesity and poor control See history of present illness for detailed discussion of current diabetes management, blood sugar patterns and problems identified  Her blood sugar readings are poorly controlled A1c is 13.5 and has been consistently very high Blood sugar records and patterns were reviewed and discussed with patient  With his inconsistent blood sugar patterns difficult to adjust his insulin regimen Also he has checked only inconsistent times for his blood sugar and on an average less than once a day He may be getting an adequate insulin coverage for his evening meal but this is difficult to assert and Also since his fasting readings are quite  variable but cannot change his basal delivery weight at this time  Also with his C-peptide level of 4.4 currently is not eligible for another insulin pump He has seen the dietitian this year and and is reportedly doing well with his diet and avoiding high-fat foods Not clear if he is benefiting from [place]   Discussed with the patient advocate for his blood sugar control would be to monitor his blood sugars very regularly and at least twice a day, preferably 3 times and he thinks he can ask his girlfriend to help him remember this    Patient Instructions  If sugar is > 300 then check again 2 hrs later  Always check sugar at mealtimes and bedtime    Counseling time on subjects discussed in assessment and plan sections is over 50% of today's 25 minute visit   Ethan Campbell 06/07/2017, 4:47 PM   Note: This office note was prepared with Dragon voice recognition system technology. Any transcriptional errors that result from this process are unintentional.

## 2017-06-08 ENCOUNTER — Telehealth: Payer: Self-pay | Admitting: Endocrinology

## 2017-06-08 NOTE — Telephone Encounter (Signed)
New Message  Ethan Campbell verbalized from [employer] she sent paperwork twice last month and have not heard back.  Ethan Campbell verbalized she will resend paperwork and to be on the lookout for the paperwork, sent to fax [phone].  Please f/u

## 2017-06-13 NOTE — Telephone Encounter (Signed)
Has anyone see this paperwork?

## 2017-06-13 NOTE — Telephone Encounter (Signed)
He is not going to get [employer] pump

## 2017-08-01 ENCOUNTER — Other Ambulatory Visit: Payer: Self-pay | Admitting: Endocrinology

## 2017-08-01 DIAGNOSIS — E1165 Type 2 diabetes mellitus with hyperglycemia: Secondary | ICD-10-CM

## 2017-08-03 ENCOUNTER — Other Ambulatory Visit: Payer: Medicaid Other

## 2017-08-03 NOTE — Addendum Note (Signed)
Addended by: Kaylyn Lim I on: 08/03/2017 01:56 PM   Modules accepted: Orders

## 2017-08-03 NOTE — Addendum Note (Signed)
Addended by: Kaylyn Lim I on: 08/03/2017 01:55 PM   Modules accepted: Orders

## 2017-08-07 ENCOUNTER — Ambulatory Visit: Payer: Medicaid Other | Admitting: Endocrinology

## 2017-08-07 ENCOUNTER — Other Ambulatory Visit: Payer: Self-pay

## 2017-08-07 ENCOUNTER — Encounter: Payer: Self-pay | Admitting: Endocrinology

## 2017-08-07 VITALS — BP 106/62 | HR 95 | Ht 69.0 in | Wt 280.0 lb

## 2017-08-07 DIAGNOSIS — Z794 Long term (current) use of insulin: Secondary | ICD-10-CM

## 2017-08-07 DIAGNOSIS — E1165 Type 2 diabetes mellitus with hyperglycemia: Secondary | ICD-10-CM | POA: Diagnosis not present

## 2017-08-07 MED ORDER — V-GO 40 KIT: PACK | 3 refills | Status: DC

## 2017-08-07 NOTE — Patient Instructions (Signed)
Check 3x daily  Take 4 clicks before Bfst and 7-8 at supper plus 1 EXTRA click each 374 points over 200

## 2017-08-07 NOTE — Progress Notes (Signed)
Patient ID: Ethan Campbell, male   DOB: [birth year], 57 y.o.   MRN: [id number]   Reason for Appointment: Diabetes follow-up   History of Present Illness   Diagnosis: Type 2 DIABETES MELITUS, date of diagnosis: 2000    Previous history: he has been on insulin for several years with consistently poor control Has been requiring large doses of insulin for his diabetes but A1c has been persistently high Blood sugars did not improve significantly even with trying Byetta and Victoza In 2014 he was switched from NovoLog to U-500 insulin but not clear if he has had improvement in control except with fasting readings His prior A1c was 14.0 in 5/14 and he had educational discussions with diabetes educator and dietitian in 6/14   Recent history:  Insulin regimen:  Humulin R U-500 with V-go pump, 30 units basal. Boluses 3 clicks lunch --6 clicks dinner  Non-insulin hypoglycemic drugs: Victoza 1.8 mg daily   He was started on the V-go pump in 2/17 because of marked variability in blood sugars and overall high readings persistently  A1c has been persistently very significantly high, last 13.5 Fructosamine on 08/04/17 was 591  Current management, blood sugar patterns and problems identified:  He was advised to check his blood sugars multiple times a day but is still taking blood sugars only once a day recently and on some days none at all  His blood sugars are much higher compared to his last visit and he does not know why  He has also lost significant weight  He says he is trying to eat low fat meals and except for today usually no fast food  Does not complain of excessive thirst and is drinking sugar-free drinks  Does not exercise and is complaining about more fatigue now  FASTING blood sugars are fairly consistently high with only a rare blood sugar below 300; he did have a reading of 60 but preceded by a very high reading at midnight the night before  Blood sugars are being  checked very sporadically at night after supper and these all appear to be over 500  He says he is trying to take the bolus clicks with his meals consistently as before and has gone up to taking 6 clicks at suppertime and takes up to 7 clicks for high sugars at night  Usually changing his pump consistently in the morning  He was checked for a C-peptide level and this was not low, not able to get an insulin pump at this time  Mealtimes: 12 pm, 6 pm  Proper timing of medications in relation to meals: Yes, usually 30 minutes before eating .          Monitors blood glucose:  1-2 times a day.    Glucometer:  Accu-Chek     Blood Glucose readings from download:  Mean values apply above for all meters except median for One Touch  PRE-MEAL Fasting 2-4 PM  Dinner Bedtime Overall  Glucose range: 60-600+  167-461   503-600+    Mean/median: 350     359      Physical activity: exercise:none   Last dietitian visit: 6/18 and last CDE visit in 4/16  Wt Readings from Last 3 Encounters:  08/07/17 280 lb (127 kg)  06/07/17 295 lb 9.6 oz (134.1 kg)  04/06/17 290 lb (131.5 kg)    LABS:   Lab Results  Component Value Date   HGBA1C 13.5 (H) 06/02/2017   HGBA1C 12.2 04/06/2017  HGBA1C 14.3 11/10/2016   Lab Results  Component Value Date   MICROALBUR 0.7 03/07/2016   LDLCALC 83 10/13/2014   CREATININE 2.02 (H) 04/26/2017    Lab Results  Component Value Date   FRUCTOSAMINE 494 (H) 12/22/2016   FRUCTOSAMINE 471 (H) 11/14/2013   FRUCTOSAMINE 387 (H) 07/10/2013      other active problems discussed in review of systems   Allergies as of 08/07/2017   No Known Allergies     Medication List        Accurate as of 08/07/17  3:54 PM. Always use your most recent med list.          amLODipine 10 MG tablet Commonly known as:  NORVASC Take 10 mg by mouth daily.   aspirin EC 81 MG tablet Take by mouth.   atorvastatin 80 MG tablet Commonly known as:  LIPITOR TAKE 1 TABLET DAILY.     benazepril 20 MG tablet Commonly known as:  LOTENSIN Take 20 mg by mouth daily.   CALCIUM 1000 + D 1000-800 MG-UNIT Tabs Generic drug:  Calcium Carb-Cholecalciferol Take by mouth.   DULoxetine 60 MG capsule Commonly known as:  CYMBALTA Take by mouth.   fenofibrate 145 MG tablet Commonly known as:  TRICOR Take 145 mg by mouth daily.   FLUoxetine 20 MG capsule Commonly known as:  PROZAC Take 20 mg by mouth daily.   furosemide 80 MG tablet Commonly known as:  LASIX Take by mouth 2 (two) times daily. Takes one tablet in the morning and one-half tablet in the evening   gabapentin 300 MG capsule Commonly known as:  NEURONTIN Take by mouth.   glucose blood test strip Commonly known as:  ACCU-CHEK AVIVA PLUS TEST UP TO 4 TIMES DAILY Dx code E11.65   HUMULIN R 500 UNIT/ML injection Generic drug:  insulin regular human CONCENTRATED USE AS DIRECTED, UP TO 35 UNITS 3 TIMES A DAY.   HYDROcodone-acetaminophen 10-325 MG tablet Commonly known as:  NORCO Take 1 tablet by mouth every 6 (six) hours as needed for moderate pain or severe pain. Reported on 01/22/2016   Insulin Syringe-Needle U-100 27G X 1/2" 1 ML Misc Use to fill V-go pump daily   levothyroxine 75 MCG tablet Commonly known as:  SYNTHROID, LEVOTHROID Take by mouth.   liraglutide 18 MG/3ML Sopn Commonly known as:  VICTOZA USE 1.8 MG SUB-Q EACH DAY.   meclizine 25 MG tablet Commonly known as:  ANTIVERT Take 25 mg by mouth 3 (three) times daily as needed for dizziness.   midodrine 5 MG tablet Commonly known as:  PROAMATINE Take by mouth.   potassium chloride 10 MEQ tablet Commonly known as:  K-DUR Take by mouth.   tamsulosin 0.4 MG Caps capsule Commonly known as:  FLOMAX Take 0.4 mg by mouth.   tiZANidine 4 MG tablet Commonly known as:  ZANAFLEX Take 4 mg by mouth 2 (two) times daily.   V-GO 40 Kit USE 1 POD DAILY WITH INSULIN   warfarin 10 MG tablet Commonly known as:  COUMADIN Take by mouth.    warfarin 7.5 MG tablet Commonly known as:  COUMADIN Take 10 mg by mouth.       Allergies: No Known Allergies  Past Medical History:  Diagnosis Date  . Essential hypertension   . History of stroke   . Hyperlipidemia   . Insomnia   . Obesity   . Precordial pain June 2011   Nuclear stress; no ischemia; EF 60%  . Proteinuria   .  Sleep apnea    Dr. Brandon Melnick  . Type 2 diabetes mellitus ([place])   . Venous insufficiency     Past Surgical History:  Procedure Laterality Date  . RESECTION BONE TUMOR FEMUR  1980's   Left femur, [facility] at [facility] with bone graft  . TOTAL KNEE ARTHROPLASTY Left 2013    Family History  Problem Relation Age of Onset  . Heart attack Mother 5  . Breast cancer Mother   . Stroke Mother   . Heart attack Father 81  . Breast cancer Sister   . Colon cancer Sister     Social History:  reports that he has quit smoking. His smoking use included cigarettes. he has never used smokeless tobacco. He reports that he does not drink alcohol or use drugs.  Review of Systems:  Hypothyroid, treated by PCP, reportedly on  levothyroxine 75ug daily And followed by PCP, no recent labs available   Lab Results  Component Value Date   TSH 1.77 04/19/2016   He says he has had frequent syncopal spells recently   He is still taking midodrine from various physicians who are following his syncopal episodes   Lipids: triglycerides on labs now are down to 161 with LDL 92  Followed by PCP   Lab Results  Component Value Date   CHOL 147 03/07/2016   HDL 31.90 (L) 03/07/2016   LDLCALC 83 10/13/2014   LDLDIRECT 80.0 03/07/2016   TRIG 251.0 (H) 03/07/2016   CHOLHDL 5 03/07/2016      CKD: His creatinine has been In the 2-3 range No records from nephrologist available Creatinine now is 2.8   Lab Results  Component Value Date   CREATININE 2.02 (H) 04/26/2017   He has been told to have CHF In the past and is taking Lasix    Examination:   BP 106/62   Pulse  95   Ht _0  (1.753 m)   Wt 280 lb (127 kg)   SpO2 98%   BMI 41.35 kg/m   Body mass index is 41.35 kg/m.     ASSESSMENT/ PLAN:   Diabetes type 2 with obesity and poor control See history of present illness for detailed discussion of current diabetes management, blood sugar patterns and problems identified  Her blood sugar readings are higher than usual and not clear why A1c is 13.5 on the last measurement and fructosamine is significantly high at 591 He has progressively higher readings from morning until night despite trying to increase his boluses and using the U-500 insulin Compliance appears to be fairly good with his changing the pump, insulin bolusing and diet Has lost weight possibly from high sugars Not clear if he is benefiting from [facility]  Will have him try the 40 unit pump for now and given a sample to try He will need to increase his boluses by 2-4 units at suppertime for now and given him correction doses for high readings, to use 2 units for every 100 mg over 100  Emphasized the need to check blood sugars 3-4 times a day Follow-up in one month   Patient Instructions  Check 3x daily  Take 4 clicks before Bfst and 7-8 at supper plus 1 EXTRA click each 580 points over 200    Counseling time on subjects discussed in assessment and plan sections is over 50% of today's 25 minute visit   Elayne Snare 08/07/2017, 3:54 PM   Note: This office note was prepared with Dragon voice recognition system technology. Any transcriptional  errors that result from this process are unintentional.

## 2017-08-09 ENCOUNTER — Other Ambulatory Visit: Payer: Self-pay | Admitting: Endocrinology

## 2017-09-06 ENCOUNTER — Ambulatory Visit: Payer: Medicaid Other | Admitting: Endocrinology

## 2017-09-11 ENCOUNTER — Ambulatory Visit: Payer: Medicaid Other | Admitting: Endocrinology

## 2017-09-11 ENCOUNTER — Encounter: Payer: Self-pay | Admitting: Endocrinology

## 2017-09-11 VITALS — BP 118/81 | HR 75 | Temp 98.0°F | Resp 16 | Ht 68.5 in | Wt 288.0 lb

## 2017-09-11 DIAGNOSIS — E1165 Type 2 diabetes mellitus with hyperglycemia: Secondary | ICD-10-CM

## 2017-09-11 DIAGNOSIS — N183 Chronic kidney disease, stage 3 unspecified: Secondary | ICD-10-CM

## 2017-09-11 LAB — POCT GLYCOSYLATED HEMOGLOBIN (HGB A1C): HEMOGLOBIN A1C: 13.4

## 2017-09-11 NOTE — Patient Instructions (Addendum)
Check blood sugars on waking up daily   Also check blood sugars about 2 hours after a meal and do this after different meals by rotation  Recommended blood sugar levels on waking up is 90-130 and about 2 hours after meal is 130-180  Please bring your blood sugar monitor to each visit, thank you

## 2017-09-11 NOTE — Progress Notes (Signed)
Patient ID: Ethan Campbell, male   DOB: 05-Mar-1961, 57 y.o.   MRN: [id number]   Reason for Appointment: Diabetes follow-up   History of Present Illness   Diagnosis: Type 2 DIABETES MELITUS, date of diagnosis: 2000    Previous history: he has been on insulin for several years with consistently poor control Has been requiring large doses of insulin for his diabetes but A1c has been persistently high Blood sugars did not improve significantly even with trying Byetta and Victoza In 2014 he was switched from NovoLog to U-500 insulin but not clear if he has had improvement in control except with fasting readings His prior A1c was 14.0 in 5/14 and he had educational discussions with diabetes educator and dietitian in 6/14   Recent history:  Insulin regimen:  Humulin R U-500 with V-go pump, 30 units basal. Boluses 3-5 clicks lunch 4-6 clicks dinner  Non-insulin hypoglycemic drugs: Victoza 1.8 mg daily   He was started on the V-go pump in 2/17 because of marked variability in blood sugars and overall high readings persistently  A1c has been persistently very significantly high, last 13.5 gained about the same at 13.4  Fructosamine on 08/04/17 was 591  Current management, blood sugar patterns and problems identified:  AVERAGE blood sugar is much better compared to the last visit  He was told to try a 40 unit basal pump but he only used the sample and did not get the prescription; however his average blood sugars are much better than the last visit even with continuing the 30 unit pump  He still has very labile blood sugars variability at all times weekly in the morning  However most of his high readings seem to be in the afternoons, before or after lunch  He has variable times of checking blood sugars and not clear which readings are before or after eating  Told to take more readings in the evening before and after supper but he is usually not checking these  Although he is  trying to adjust his mealtime doses based on what he is eating this is quite inconsistent and last evening even with eating only sauted fish and beans he took 5 clicks and his sugar was low normal this morning  HYPOGLYCEMIA has been sporadic and mostly around 8-10 AM 3 episodes and couple of times around suppertime  Usually changing his pump consistently in the morning  He was checked for a C-peptide level and this was not low, not able to get an insulin pump at this time  Mealtimes: 12 pm, 6 pm  Proper timing of medications in relation to meals: Yes, usually 30 minutes before eating .          Monitors blood glucose:  1-2 times a day.    Glucometer:  Accu-Chek     Blood Glucose readings from download:  Mean values apply above for all meters except median for One Touch  PRE-MEAL Fasting Lunch Dinner Bedtime Overall  Glucose range:  48-414   53-335    Mean/median:  240    230  241+/-144   POST-MEAL PC Breakfast PC Lunch PC Dinner  Glucose range:   303   Mean/median:   54-153     Previous blood sugars: Mean values apply above for all meters except median for One Touch  PRE-MEAL Fasting 2-4 PM  Dinner Bedtime Overall  Glucose range: 60-600+  167-461   503-600+    Mean/median: 350     359  Physical activity: exercise:none   Last dietitian visit: 6/18 and last CDE visit in 4/16  Wt Readings from Last 3 Encounters:  09/11/17 288 lb (130.6 kg)  08/07/17 280 lb (127 kg)  06/07/17 295 lb 9.6 oz (134.1 kg)    LABS:   Lab Results  Component Value Date   HGBA1C 13.4 09/11/2017   HGBA1C 13.5 (H) 06/02/2017   HGBA1C 12.2 04/06/2017   Lab Results  Component Value Date   MICROALBUR 0.7 03/07/2016   LDLCALC 83 10/13/2014   CREATININE 2.02 (H) 04/26/2017    Lab Results  Component Value Date   FRUCTOSAMINE 494 (H) 12/22/2016   FRUCTOSAMINE 471 (H) 11/14/2013   FRUCTOSAMINE 387 (H) 07/10/2013      other active problems discussed in review of systems   Allergies  as of 09/11/2017   No Known Allergies     Medication List        Accurate as of 09/11/17  2:28 PM. Always use your most recent med list.          amLODipine 10 MG tablet Commonly known as:  NORVASC Take 10 mg by mouth daily.   aspirin EC 81 MG tablet Take by mouth.   atorvastatin 80 MG tablet Commonly known as:  LIPITOR TAKE 1 TABLET DAILY.   benazepril 20 MG tablet Commonly known as:  LOTENSIN Take 20 mg by mouth daily.   CALCIUM 1000 + D 1000-800 MG-UNIT Tabs Generic drug:  Calcium Carb-Cholecalciferol Take by mouth.   DULoxetine 60 MG capsule Commonly known as:  CYMBALTA Take by mouth.   fenofibrate 145 MG tablet Commonly known as:  TRICOR Take 145 mg by mouth daily.   FLUoxetine 20 MG capsule Commonly known as:  PROZAC Take 20 mg by mouth daily.   furosemide 80 MG tablet Commonly known as:  LASIX Take by mouth 2 (two) times daily. Takes one tablet in the morning and one-half tablet in the evening   gabapentin 300 MG capsule Commonly known as:  NEURONTIN Take by mouth.   glucose blood test strip Commonly known as:  ACCU-CHEK AVIVA PLUS TEST UP TO 4 TIMES DAILY Dx code E11.65   HUMULIN R 500 UNIT/ML injection Generic drug:  insulin regular human CONCENTRATED USE AS DIRECTED, INJECT UP TO 30 UNITS 3 TIMES A DAY.   HYDROcodone-acetaminophen 10-325 MG tablet Commonly known as:  NORCO Take 1 tablet by mouth every 6 (six) hours as needed for moderate pain or severe pain. Reported on 01/22/2016   Insulin Syringe-Needle U-100 27G X 1/2" 1 ML Misc Use to fill V-go pump daily   levothyroxine 75 MCG tablet Commonly known as:  SYNTHROID, LEVOTHROID Take by mouth.   liraglutide 18 MG/3ML Sopn Commonly known as:  VICTOZA USE 1.8 MG SUB-Q EACH DAY.   meclizine 25 MG tablet Commonly known as:  ANTIVERT Take 25 mg by mouth 3 (three) times daily as needed for dizziness.   midodrine 5 MG tablet Commonly known as:  PROAMATINE Take by mouth.   potassium  chloride 10 MEQ tablet Commonly known as:  K-DUR Take by mouth.   tamsulosin 0.4 MG Caps capsule Commonly known as:  FLOMAX Take 0.4 mg by mouth.   tiZANidine 4 MG tablet Commonly known as:  ZANAFLEX Take 4 mg by mouth 2 (two) times daily.   V-GO 40 Kit USE 1 POD DAILY WITH INSULIN   warfarin 10 MG tablet Commonly known as:  COUMADIN Take by mouth.   warfarin 7.5 MG tablet Commonly known  as:  COUMADIN Take 10 mg by mouth.       Allergies: No Known Allergies  Past Medical History:  Diagnosis Date  . Essential hypertension   . History of stroke   . Hyperlipidemia   . Insomnia   . Obesity   . Precordial pain June 2011   Nuclear stress; no ischemia; EF 60%  . Proteinuria   . Sleep apnea    Dr. Brandon Melnick  . Type 2 diabetes mellitus ([place])   . Venous insufficiency     Past Surgical History:  Procedure Laterality Date  . RESECTION BONE TUMOR FEMUR  1980's   Left femur, [facility] at [facility] with bone graft  . TOTAL KNEE ARTHROPLASTY Left 2013    Family History  Problem Relation Age of Onset  . Heart attack Mother 70  . Breast cancer Mother   . Stroke Mother   . Heart attack Father 42  . Breast cancer Sister   . Colon cancer Sister     Social History:  reports that he has quit smoking. His smoking use included cigarettes. he has never used smokeless tobacco. He reports that he does not drink alcohol or use drugs.  Review of Systems:  Hypothyroid, treated by PCP, reportedly on  levothyroxine 75ug daily And followed by PCP He is supposed to have more labs done through him but reports are not available for previous results   Lab Results  Component Value Date   TSH 1.77 04/19/2016   He says he has had continued syncopal spells recently   He is still taking midodrine from various physicians who are following his syncopal episodes  Lipids: triglycerides on labs on the last measurement are down to 161 with LDL 92  Followed by PCP   Lab Results  Component  Value Date   CHOL 147 03/07/2016   HDL 31.90 (L) 03/07/2016   LDLCALC 83 10/13/2014   LDLDIRECT 80.0 03/07/2016   TRIG 251.0 (H) 03/07/2016   CHOLHDL 5 03/07/2016      CKD: His creatinine has been In the 2-3 range Creatinine recently has been better    Lab Results  Component Value Date   CREATININE 2.02 (H) 04/26/2017   He has been told to have CHF In the past and is still taking Lasix    Examination:   BP 118/81 (BP Location: Left Arm, Patient Position: Sitting, Cuff Size: Large)   Pulse 75   Temp 98 F (36.7 C) (Oral)   Resp 16   Ht 5' 8.5" (1.74 m)   Wt 288 lb (130.6 kg)   SpO2 95%   BMI 43.15 kg/m   Body mass index is 43.15 kg/m.     ASSESSMENT/ PLAN:   Diabetes type 2 with obesity and poor control See history of present illness for detailed discussion of current diabetes management, blood sugar patterns and problems identified  His A1c is still 13.4  Although his blood sugars on an average with better, now averaging about 240 compared to 350 on the last visit for the last month he still has very labile and mostly high readings His treatment is limited by unexpected hypoglycemia both early morning and sometimes late afternoon Also blood sugars after supper not consistently high  Since his fasting readings may occasionally below will not increase his recent rate to 40 units He will continue 30 unit basal for now Will need to check if he can get the Omnipod pump  Not clear if he is benefiting from [place] which he  is continuing  Discussed his day-to-day management of the boluses based on what he is eating and explained to him that he needs to look at the number of carbohydrates he is getting and given examples of carbohydrates Also to reduce the dose if he is eating less carbohydrates and more protein such as what he did last night He does need to check his blood sugars more consistently before and after supper to help him adjust his doses and discussed blood  sugar targets at various times    There are no Patient Instructions on file for this visit.   Counseling time on subjects discussed in assessment and plan sections is over 50% of today's 25 minute visit   Elayne Snare 09/11/2017, 2:28 PM   Note: This office note was prepared with Dragon voice recognition system technology. Any transcriptional errors that result from this process are unintentional.

## 2017-11-05 ENCOUNTER — Other Ambulatory Visit: Payer: Self-pay | Admitting: Endocrinology

## 2017-11-05 DIAGNOSIS — E063 Autoimmune thyroiditis: Secondary | ICD-10-CM

## 2017-11-05 DIAGNOSIS — E1165 Type 2 diabetes mellitus with hyperglycemia: Secondary | ICD-10-CM

## 2017-11-06 ENCOUNTER — Other Ambulatory Visit (INDEPENDENT_AMBULATORY_CARE_PROVIDER_SITE_OTHER): Payer: Medicaid Other

## 2017-11-06 DIAGNOSIS — E1165 Type 2 diabetes mellitus with hyperglycemia: Secondary | ICD-10-CM | POA: Diagnosis not present

## 2017-11-06 DIAGNOSIS — E063 Autoimmune thyroiditis: Secondary | ICD-10-CM | POA: Diagnosis not present

## 2017-11-06 LAB — LIPID PANEL
CHOLESTEROL: 126 mg/dL (ref 0–200)
HDL: 34.4 mg/dL — AB (ref >39.00)
LDL Cholesterol: 61 mg/dL (ref 0–99)
NONHDL: 91.6
Total CHOL/HDL Ratio: 4
Triglycerides: 155 mg/dL — ABNORMAL HIGH (ref 0.0–149.0)
VLDL: 31 mg/dL (ref 0.0–40.0)

## 2017-11-06 LAB — BASIC METABOLIC PANEL
BUN: 37 mg/dL — AB (ref 6–23)
CO2: 29 mEq/L (ref 19–32)
CREATININE: 2.51 mg/dL — AB (ref 0.40–1.50)
Calcium: 9 mg/dL (ref 8.4–10.5)
Chloride: 89 mEq/L — ABNORMAL LOW (ref 96–112)
GFR: 28.35 mL/min — AB (ref >60.00)
Glucose, Bld: 585 mg/dL (ref 70–99)
POTASSIUM: 3.8 meq/L (ref 3.5–5.1)
Sodium: 125 mEq/L — ABNORMAL LOW (ref 135–145)

## 2017-11-06 LAB — MICROALBUMIN / CREATININE URINE RATIO
Creatinine,U: 33.1 mg/dL
MICROALB/CREAT RATIO: 4.9 mg/g (ref 0.0–30.0)
Microalb, Ur: 1.6 mg/dL (ref 0.0–1.9)

## 2017-11-06 LAB — TSH: TSH: 4.58 u[IU]/mL — ABNORMAL HIGH (ref 0.35–4.50)

## 2017-11-06 LAB — T4, FREE: FREE T4: 0.74 ng/dL (ref 0.60–1.60)

## 2017-11-07 LAB — FRUCTOSAMINE: Fructosamine: 512 umol/L — ABNORMAL HIGH (ref 0–285)

## 2017-11-09 ENCOUNTER — Ambulatory Visit (INDEPENDENT_AMBULATORY_CARE_PROVIDER_SITE_OTHER): Payer: Medicaid Other | Admitting: Endocrinology

## 2017-11-09 ENCOUNTER — Encounter: Payer: Self-pay | Admitting: Endocrinology

## 2017-11-09 VITALS — BP 96/58 | HR 71 | Ht 68.5 in | Wt 285.6 lb

## 2017-11-09 DIAGNOSIS — I951 Orthostatic hypotension: Secondary | ICD-10-CM | POA: Diagnosis not present

## 2017-11-09 DIAGNOSIS — E039 Hypothyroidism, unspecified: Secondary | ICD-10-CM

## 2017-11-09 DIAGNOSIS — E1165 Type 2 diabetes mellitus with hyperglycemia: Secondary | ICD-10-CM

## 2017-11-09 DIAGNOSIS — Z794 Long term (current) use of insulin: Secondary | ICD-10-CM | POA: Diagnosis not present

## 2017-11-09 MED ORDER — LEVOTHYROXINE SODIUM 88 MCG PO TABS: 88.0000 ug | ORAL_TABLET | Freq: Every day | ORAL | 3 refills | Status: DC

## 2017-11-09 MED ORDER — V-GO 40 KIT: 1.0000 | PACK | Freq: Every day | 3 refills | Status: DC

## 2017-11-09 NOTE — Progress Notes (Signed)
Patient ID: Ethan Campbell, male   DOB: 1961/04/04, 57 y.o.   MRN: [id number]   Reason for Appointment: Diabetes follow-up   History of Present Illness   Diagnosis: Type 2 DIABETES MELITUS, date of diagnosis: 2000    Previous history: he has been on insulin for several years with consistently poor control Has been requiring large doses of insulin for his diabetes but A1c has been persistently high Blood sugars did not improve significantly even with trying Byetta and Victoza In 2014 he was switched from NovoLog to U-500 insulin but not clear if he has had improvement in control except with fasting readings His prior A1c was 14.0 in 5/14 and he had educational discussions with diabetes educator and dietitian in 6/14   Recent history:  Insulin regimen:  Humulin R U-500 with V-go pump, 30 units basal. Boluses 4 clicks lunch 5 clicks dinner  Non-insulin hypoglycemic drugs: Victoza 1.8 mg daily   He was started on the V-go pump in 2/17 because of marked variability in blood sugars and overall high readings persistently  A1c has been persistently very significantly high, last 13.4 in 2/19  Fructosamine was 512  Current management, blood sugar patterns and problems identified:  Although his blood sugars were relatively better at home on an average on his last visit they are generally much higher  All his fasting readings are very consistently high now even though on his last visit in the morning they were as low as 48  Despite reminders he does not MONITOR his blood sugars much and mostly before his first meal which is at variable times  He is erratically monitoring his blood sugars in the afternoons or sometimes at night at night   On his last visit he was told to check his sugar consistently before his evening meal and make adjustments on his bolus based on the blood sugar  He is not doing this and has only a couple of readings before supper which are  variable  However blood sugars are consistently high after evening meal also  This is despite his saying that he is frequently just eating a small meal like a chicken noodle soup with crackers  Is AVERAGE blood sugar is much better compared to the last visit  Since he is not checking enough readings at bedtime he is not doing correction boluses at night also  HYPOGLYCEMIA has not been recorded recently with only one glucose of 61 late afternoon  He is not exercising  Usually changing his pump consistently in the morning  He was approved for the Omnipod insulin pump but he did not sign the approval for the controller and send it back   Mealtimes: 12 pm, 6 pm  Proper timing of medications in relation to meals: Yes, usually 30 minutes before eating .          Monitors blood glucose:  1-2 times a day.    Glucometer:  Accu-Chek     Blood Glucose readings from download:   Mean values apply above for all meters except median for One Touch  PRE-MEAL Fasting Lunch Dinner  PCS/hs Overall  Glucose range:  152-455   61-500+  294-500+   Mean/median:  305    370  321+/-109    Previous readings   PRE-MEAL Fasting Lunch Dinner Bedtime Overall  Glucose range:  48-414   53-335    Mean/median:  240    230  241+/-144   POST-MEAL PC Breakfast PC Lunch PC  Dinner  Glucose range:   303   Mean/median:   54-153    Physical activity: exercise:none   Last dietitian visit: 6/18 and last CDE visit in 4/16  Wt Readings from Last 3 Encounters:  11/09/17 285 lb 9.6 oz (129.5 kg)  09/11/17 288 lb (130.6 kg)  08/07/17 280 lb (127 kg)    LABS:   Lab Results  Component Value Date   HGBA1C 13.4 09/11/2017   HGBA1C 13.5 (H) 06/02/2017   HGBA1C 12.2 04/06/2017   Lab Results  Component Value Date   MICROALBUR 1.6 11/06/2017   LDLCALC 61 11/06/2017   CREATININE 2.51 (H) 11/06/2017    Lab Results  Component Value Date   FRUCTOSAMINE 512 (H) 11/06/2017   FRUCTOSAMINE 494 (H) 12/22/2016    FRUCTOSAMINE 471 (H) 11/14/2013      other active problems discussed in review of systems   Allergies as of 11/09/2017   No Known Allergies     Medication List        Accurate as of 11/09/17  8:50 PM. Always use your most recent med list.          amLODipine 10 MG tablet Commonly known as:  NORVASC Take 10 mg by mouth daily.   aspirin EC 81 MG tablet Take by mouth.   atorvastatin 80 MG tablet Commonly known as:  LIPITOR TAKE 1 TABLET DAILY.   benazepril 20 MG tablet Commonly known as:  LOTENSIN Take 20 mg by mouth daily.   CALCIUM 1000 + D 1000-800 MG-UNIT Tabs Generic drug:  Calcium Carb-Cholecalciferol Take by mouth.   DULoxetine 60 MG capsule Commonly known as:  CYMBALTA Take by mouth.   fenofibrate 145 MG tablet Commonly known as:  TRICOR Take 145 mg by mouth daily.   FLUoxetine 20 MG capsule Commonly known as:  PROZAC Take 20 mg by mouth daily.   furosemide 80 MG tablet Commonly known as:  LASIX Take by mouth 2 (two) times daily. Takes one tablet in the morning and one-half tablet in the evening   gabapentin 300 MG capsule Commonly known as:  NEURONTIN Take by mouth.   glucose blood test strip Commonly known as:  ACCU-CHEK AVIVA PLUS TEST UP TO 4 TIMES DAILY Dx code E11.65   HUMULIN R 500 UNIT/ML injection Generic drug:  insulin regular human CONCENTRATED USE AS DIRECTED, INJECT UP TO 30 UNITS 3 TIMES A DAY.   Insulin Syringe-Needle U-100 27G X 1/2" 1 ML Misc Use to fill V-go pump daily   levothyroxine 75 MCG tablet Commonly known as:  SYNTHROID, LEVOTHROID Take by mouth.   liraglutide 18 MG/3ML Sopn Commonly known as:  VICTOZA USE 1.8 MG SUB-Q EACH DAY.   meclizine 25 MG tablet Commonly known as:  ANTIVERT Take 25 mg by mouth 3 (three) times daily as needed for dizziness.   midodrine 5 MG tablet Commonly known as:  PROAMATINE Take by mouth.   potassium chloride 10 MEQ tablet Commonly known as:  K-DUR Take by mouth.     tamsulosin 0.4 MG Caps capsule Commonly known as:  FLOMAX Take 0.4 mg by mouth.   tiZANidine 4 MG tablet Commonly known as:  ZANAFLEX Take 4 mg by mouth 2 (two) times daily.   V-GO 40 Kit 1 each by Does not apply route daily. Use 1 disposable insulin pump daily.   warfarin 10 MG tablet Commonly known as:  COUMADIN Take by mouth.   warfarin 7.5 MG tablet Commonly known as:  COUMADIN Take 10 mg  by mouth.       Allergies: No Known Allergies  Past Medical History:  Diagnosis Date  . Essential hypertension   . History of stroke   . Hyperlipidemia   . Insomnia   . Obesity   . Precordial pain June 2011   Nuclear stress; no ischemia; EF 60%  . Proteinuria   . Sleep apnea    Dr. Brandon Melnick  . Type 2 diabetes mellitus ([place])   . Venous insufficiency     Past Surgical History:  Procedure Laterality Date  . RESECTION BONE TUMOR FEMUR  1980's   Left femur, treated at [facility] with bone graft  . TOTAL KNEE ARTHROPLASTY Left 2013    Family History  Problem Relation Age of Onset  . Heart attack Mother 69  . Breast cancer Mother   . Stroke Mother   . Heart attack Father 36  . Breast cancer Sister   . Colon cancer Sister     Social History:  reports that he has quit smoking. His smoking use included cigarettes. He has never used smokeless tobacco. He reports that he does not drink alcohol or use drugs.  Review of Systems:  Hypothyroid, treated by PCP, reportedly on  levothyroxine 75ug daily And followed by PCP Recent TSH is relatively high   Lab Results  Component Value Date   TSH 4.58 (H) 11/06/2017   He says he has had syncopal spells, not always associated with low blood pressure Neurologist indicated that he may have vertebral artery flow issues   He is still taking midodrine from various physicians for orthostatic hypotension who are following his syncopal episodes  Lipids: triglycerides on labs on the last measurement are down to 161 with LDL 92  Followed by  PCP   Lab Results  Component Value Date   CHOL 126 11/06/2017   HDL 34.40 (L) 11/06/2017   LDLCALC 61 11/06/2017   LDLDIRECT 80.0 03/07/2016   TRIG 155.0 (H) 11/06/2017   CHOLHDL 4 11/06/2017      CKD: His creatinine has been In the 2-3 range Creatinine recently has been better    Lab Results  Component Value Date   CREATININE 2.51 (H) 11/06/2017   He has been told to have CHF In the past and is still taking Lasix    Examination:   BP (!) 96/58 (BP Location: Left Arm, Patient Position: Standing, Cuff Size: Large)   Pulse 71   Ht 5' 8.5" (1.74 m)   Wt 285 lb 9.6 oz (129.5 kg)   SpO2 96%   BMI 42.79 kg/m   Body mass index is 42.79 kg/m.     ASSESSMENT/ PLAN:   Diabetes type 2 with obesity and poor control See history of present illness for detailed discussion of current diabetes management, blood sugar patterns and problems identified  He is on a V-go pump using 30 units basal and U-500 insulin  His fructosamine is over 500 indicating consistently poor control  As discussed above he appears to be requiring more insulin with persistently high blood sugars and significantly insulin resistant Not able to take Actos, metformin or SGLT2 drugs because of various reasons including renal insufficiency  For now we will have him increase his basal rate using a 40 unit V-go pump, sample given and he will switch to the new prescription Emphasized the need to check blood sugars frequently and consistently especially before and after supper, he can try to have a family member reminded to do this Will increase his bolus  doses by at least 2 units for his meals for now Also needs to add extra for higher blood sugars pre-meal and at least 1-2 clicks for high bedtime readings  Have contacted the [place] representative today and will try to get him started on the pump since he is not eligible for the [employer] pump Discussed how this works and reviewed with the patient brochure with  him  ORTHOSTATIC hypotension: Blood pressure is relatively low standing up and would recommend that he check with his prescribing physician for increasing the dose of midodrine  HYPOTHYROIDISM: His TSH is high and he will be switched to the 88 mcg of levothyroxine   Patient Instructions  5 clicks am and 6 at supper  1-3 more clicks for sugars > 771  Must check before supper   Counseling time on subjects discussed in assessment and plan sections is over 50% of today's 25 minute visit      Elayne Snare 11/09/2017, 8:50 PM   Note: This office note was prepared with Dragon voice recognition system technology. Any transcriptional errors that result from this process are unintentional.

## 2017-11-09 NOTE — Patient Instructions (Signed)
5 clicks am and 6 at supper  1-3 more clicks for sugars > 855  Must check before supper

## 2017-11-24 ENCOUNTER — Other Ambulatory Visit: Payer: Self-pay | Admitting: Endocrinology

## 2017-11-28 ENCOUNTER — Telehealth: Payer: Self-pay | Admitting: Endocrinology

## 2017-11-28 NOTE — Telephone Encounter (Signed)
Patient stated that [employer] stated that they no longer cover the Vgo, patient will have to use the Omni pod, it will have to go through [place]. Phone # [phone]

## 2017-11-29 NOTE — Telephone Encounter (Signed)
Patient stated that [employer] called him and stated that his insurance would not pay for the V-GO as well as the Omnipod, so he needs to cancel the [place]. Patient stated that he has already gotten the Omnipod and the meter in the mail. Patient was told that the diabetic educator will contact him to set up a time to meet with him for training.

## 2017-11-29 NOTE — Telephone Encounter (Signed)
Okay to switch?  

## 2017-11-29 NOTE — Telephone Encounter (Signed)
Has he obtained the Omnipod pump already? He will need to be scheduled with Vaughan Basta for training if he has received this

## 2017-12-05 NOTE — Telephone Encounter (Signed)
Pump start scheduled with me for 01/12/18, and the rest of the week with Dr. Dwyane Dee

## 2017-12-11 ENCOUNTER — Telehealth: Payer: Self-pay | Admitting: Endocrinology

## 2017-12-11 NOTE — Telephone Encounter (Signed)
We had to cancel patients appt with you and pump starts with kumar due to Dr Dwyane Dee having to be out 5/23 and 5/24 due to an emergency   Thanks!

## 2017-12-12 ENCOUNTER — Encounter: Payer: Medicaid Other | Admitting: Nutrition

## 2017-12-13 ENCOUNTER — Ambulatory Visit: Payer: Medicaid Other | Admitting: Endocrinology

## 2017-12-13 NOTE — Telephone Encounter (Signed)
Pump start is resceduled for 5/28, and then with Dr. Dwyane Dee the rest of the week

## 2017-12-14 ENCOUNTER — Ambulatory Visit: Payer: Medicaid Other | Admitting: Endocrinology

## 2017-12-15 ENCOUNTER — Ambulatory Visit: Payer: Medicaid Other | Admitting: Endocrinology

## 2017-12-20 ENCOUNTER — Ambulatory Visit: Payer: Medicaid Other | Admitting: Endocrinology

## 2017-12-21 ENCOUNTER — Ambulatory Visit: Payer: Medicaid Other | Admitting: Endocrinology

## 2017-12-22 ENCOUNTER — Ambulatory Visit: Payer: Medicaid Other | Admitting: Endocrinology

## 2018-01-01 ENCOUNTER — Ambulatory Visit: Payer: Medicaid Other | Admitting: Endocrinology

## 2018-01-15 ENCOUNTER — Ambulatory Visit: Payer: Medicaid Other | Admitting: Endocrinology

## 2018-02-05 ENCOUNTER — Encounter: Payer: Medicaid Other | Attending: Endocrinology | Admitting: Nutrition

## 2018-02-05 DIAGNOSIS — E1165 Type 2 diabetes mellitus with hyperglycemia: Secondary | ICD-10-CM

## 2018-02-05 DIAGNOSIS — Z029 Encounter for administrative examinations, unspecified: Secondary | ICD-10-CM | POA: Insufficient documentation

## 2018-02-06 ENCOUNTER — Encounter: Payer: Medicaid Other | Admitting: Nutrition

## 2018-02-06 ENCOUNTER — Ambulatory Visit: Payer: Medicaid Other | Admitting: Endocrinology

## 2018-02-06 DIAGNOSIS — E1165 Type 2 diabetes mellitus with hyperglycemia: Secondary | ICD-10-CM

## 2018-02-06 DIAGNOSIS — Z029 Encounter for administrative examinations, unspecified: Secondary | ICD-10-CM | POA: Diagnosis present

## 2018-02-06 NOTE — Patient Instructions (Signed)
Test blood sugars before meals and at bedtime.   Review sheet, or booklet, if there are questions about how to give the bolus.

## 2018-02-06 NOTE — Progress Notes (Signed)
Pt. Was not wearing his V-go.  Said had given his insulin injection acB at 9AM this morning, with no other insulin today.   He was shown how to fill a pod, and filled the pod with u-500R insulin as directed at 3PM.  He did a correction dose, by putting in the reading fo 268, the pump gave him 2.55u of insulin at this time.  We discussed the IOB feature and what this means, and he reported good understanding of this. He re demonstrate how to give a bolus,and written directions were given for this as well.  He was given a log sheet and told to test his blood sugars ac and HS, and to bring the sheet tomorrow, when he sees Dr. Dwyane Dee.  He had no final questions at this time.

## 2018-02-06 NOTE — Patient Instructions (Signed)
Return tomorrow with U-500 R insulin.

## 2018-02-06 NOTE — Progress Notes (Signed)
Patient did not bring his U-500 R insulin.  So, we discussed the difference  Between his V-Go and this OmniPod.  We put in settings per Dr. Ronnie Derby orders:  Basal rate: MN: 1.0, 7AM: 1.2, Noon: 1.4, 9PM: 1.0.  I/C ratio: 10, ISF: 50 timing: 5 hours, target MN: 150, 7AM: 130, with corrections above 130 at 7AM and 150 after MN.  Meal boluses are: 100grams for lunch and 120 grams for supper, with a bedtime snack of 20 grams. (Pt. Says he sometimes eats an apple, or pear before bed).   He was shown how to give a bolus, and re demonstrated the procedure X2 correctly.  He did not want to be shown how to fill the pod, until his visit tomorrow.  He had no final questions. He was given the guide booklet to review tonight, to review how to bolus, fill the pod and change settings.  He agreed to read over the booklet tonight.

## 2018-02-07 ENCOUNTER — Encounter: Payer: Self-pay | Admitting: Endocrinology

## 2018-02-07 ENCOUNTER — Ambulatory Visit: Payer: Medicaid Other | Admitting: Endocrinology

## 2018-02-07 ENCOUNTER — Encounter: Payer: Medicaid Other | Admitting: Nutrition

## 2018-02-07 VITALS — BP 132/84 | HR 83 | Ht 68.5 in | Wt 288.0 lb

## 2018-02-07 DIAGNOSIS — E1165 Type 2 diabetes mellitus with hyperglycemia: Secondary | ICD-10-CM | POA: Diagnosis not present

## 2018-02-07 DIAGNOSIS — Z794 Long term (current) use of insulin: Secondary | ICD-10-CM

## 2018-02-07 DIAGNOSIS — E039 Hypothyroidism, unspecified: Secondary | ICD-10-CM | POA: Diagnosis not present

## 2018-02-07 NOTE — Progress Notes (Signed)
Patient ID: Ethan Campbell, male   DOB: 06-19-1961, 57 y.o.   MRN: [id number]   Reason for Appointment: Diabetes follow-up   History of Present Illness   Diagnosis: Type 2 DIABETES MELITUS, date of diagnosis: 2000    Previous history: he has been on insulin for several years with consistently poor control Has been requiring large doses of insulin for his diabetes but A1c has been persistently high Blood sugars did not improve significantly even with trying Byetta and Victoza In 2014 he was switched from NovoLog to U-500 insulin but not clear if he has had improvement in control except with fasting readings His prior A1c was 14.0 in 5/14 and he had educational discussions with diabetes educator and dietitian in 6/14   Recent history:  Insulin regimen:  Humulin R U-500 with THE POD PUMP  PREVIOUS REGIMEN: V-go pump, 30 units basal. Boluses 4 clicks lunch 5 clicks dinner  Non-insulin hypoglycemic drugs: Victoza 1.8 mg daily   He was started on the OMNIPOD pumps on 02/06/2018 with the following settings  Basal rate: Midnight = 1.0, 7 a.m. = 1.2, 12 noon = 1.4 and 9 PM = 1.2 Preset boluses: 10 units for lunch and 12 units dinner  A1c has been persistently very significantly high, last 13.4 in 2/19   Current management, blood sugar patterns and problems identified:  Although he was recommended the 40 unit V-go basal pump on the last visit he did not find his better with the sample and has continued on the 30 unit pump  However he did not bring his previous meter for download  His glucose was 264 before starting the pump yesterday afternoon, 127 before dinner  Blood sugars about 3 to 4 hours after evening meal was down to 80  However he woke up with feeling cold and shaky at 4 AM when blood sugar was down to 44  After juice his blood sugar was 76 at breakfast and he took 8.9 units at breakfast  At breakfast he had 3 biscuits only and blood sugar about 4 hours later  is 75  He currently has the old controller for his Omnipod pump and does not have the test strips for the freestyle  Currently has no difficulty doing his boluses on his own   Mealtimes: 12 pm, 6 pm  Proper timing of medications in relation to meals: Yes, usually 30 minutes before eating .          Monitors blood glucose:  1-2 times a day.    Glucometer:  Accu-Chek     Blood Glucose readings from download as above:   Blood sugar average 321 when last seen in 10/2017   Physical activity: exercise:none   Last dietitian visit: 6/18 and last CDE visit in 4/16  Wt Readings from Last 3 Encounters:  02/07/18 288 lb (130.6 kg)  11/09/17 285 lb 9.6 oz (129.5 kg)  09/11/17 288 lb (130.6 kg)    LABS:   Lab Results  Component Value Date   HGBA1C 13.4 09/11/2017   HGBA1C 13.5 (H) 06/02/2017   HGBA1C 12.2 04/06/2017   Lab Results  Component Value Date   MICROALBUR 1.6 11/06/2017   LDLCALC 61 11/06/2017   CREATININE 2.51 (H) 11/06/2017    Lab Results  Component Value Date   FRUCTOSAMINE 512 (H) 11/06/2017   FRUCTOSAMINE 494 (H) 12/22/2016   FRUCTOSAMINE 471 (H) 11/14/2013      other active problems discussed in review of systems   Allergies  as of 02/07/2018   No Known Allergies     Medication List        Accurate as of 02/07/18 12:07 PM. Always use your most recent med list.          amLODipine 10 MG tablet Commonly known as:  NORVASC Take 10 mg by mouth daily.   aspirin EC 81 MG tablet Take by mouth.   atorvastatin 80 MG tablet Commonly known as:  LIPITOR TAKE 1 TABLET DAILY.   benazepril 20 MG tablet Commonly known as:  LOTENSIN Take 20 mg by mouth daily.   DULoxetine 60 MG capsule Commonly known as:  CYMBALTA Take by mouth.   fenofibrate 145 MG tablet Commonly known as:  TRICOR Take 145 mg by mouth daily.   FLUoxetine 20 MG capsule Commonly known as:  PROZAC Take 20 mg by mouth daily.   furosemide 80 MG tablet Commonly known as:   LASIX Take by mouth 2 (two) times daily. Takes one tablet in the morning and one-half tablet in the evening   gabapentin 300 MG capsule Commonly known as:  NEURONTIN Take by mouth.   glucose blood test strip Commonly known as:  ACCU-CHEK AVIVA PLUS TEST UP TO 4 TIMES DAILY Dx code E11.65   HUMULIN R 500 UNIT/ML injection Generic drug:  insulin regular human CONCENTRATED USE AS DIRECTED, INJECT UP TO 30 UNITS 3 TIMES A DAY.   Insulin Syringe-Needle U-100 27G X 1/2" 1 ML Misc Use to fill V-go pump daily   levothyroxine 88 MCG tablet Commonly known as:  SYNTHROID, LEVOTHROID Take 1 tablet (88 mcg total) by mouth daily.   liraglutide 18 MG/3ML Sopn Commonly known as:  VICTOZA USE 1.8 MG SUB-Q EACH DAY.   meclizine 25 MG tablet Commonly known as:  ANTIVERT Take 25 mg by mouth 3 (three) times daily as needed for dizziness.   midodrine 5 MG tablet Commonly known as:  PROAMATINE Take by mouth.   potassium chloride 10 MEQ tablet Commonly known as:  K-DUR Take by mouth.   tamsulosin 0.4 MG Caps capsule Commonly known as:  FLOMAX Take 0.4 mg by mouth.   tiZANidine 4 MG tablet Commonly known as:  ZANAFLEX Take 4 mg by mouth 2 (two) times daily.   V-GO 40 Kit 1 each by Does not apply route daily. Use 1 disposable insulin pump daily.   warfarin 10 MG tablet Commonly known as:  COUMADIN Take by mouth.   warfarin 7.5 MG tablet Commonly known as:  COUMADIN Take 10 mg by mouth.       Allergies: No Known Allergies  Past Medical History:  Diagnosis Date  . Essential hypertension   . History of stroke   . Hyperlipidemia   . Insomnia   . Obesity   . Precordial pain June 2011   Nuclear stress; no ischemia; EF 60%  . Proteinuria   . Sleep apnea    Dr. Brandon Melnick  . Type 2 diabetes mellitus ([place])   . Venous insufficiency     Past Surgical History:  Procedure Laterality Date  . RESECTION BONE TUMOR FEMUR  1980's   Left femur, treated at [facility] with bone graft  .  TOTAL KNEE ARTHROPLASTY Left 2013    Family History  Problem Relation Age of Onset  . Heart attack Mother 8  . Breast cancer Mother   . Stroke Mother   . Heart attack Father 54  . Breast cancer Sister   . Colon cancer Sister  Social History:  reports that he has quit smoking. His smoking use included cigarettes. He has never used smokeless tobacco. He reports that he does not drink alcohol or use drugs.  Review of Systems:  Following is a copy of the previous note:  Hypothyroid, treated by PCP, reportedly on  levothyroxine 75ug daily And followed by PCP Recent TSH is relatively high   Lab Results  Component Value Date   TSH 4.58 (H) 11/06/2017   He says he has had syncopal spells, not always associated with low blood pressure Neurologist indicated that he may have vertebral artery flow issues   He is still taking midodrine from various physicians for orthostatic hypotension who are following his syncopal episodes  Lipids: triglycerides on labs on the last measurement are down to 161 with LDL 92  Followed by PCP   Lab Results  Component Value Date   CHOL 126 11/06/2017   HDL 34.40 (L) 11/06/2017   LDLCALC 61 11/06/2017   LDLDIRECT 80.0 03/07/2016   TRIG 155.0 (H) 11/06/2017   CHOLHDL 4 11/06/2017      CKD: His creatinine has been In the 2-3 range Creatinine recently has been as follows:    Lab Results  Component Value Date   CREATININE 2.51 (H) 11/06/2017   He has been told to have CHF In the past and is still taking Lasix    Examination:   BP 132/84   Pulse 83   Ht 5' 8.5" (1.74 m)   Wt 288 lb (130.6 kg)   SpO2 97%   BMI 43.15 kg/m   Body mass index is 43.15 kg/m.     ASSESSMENT/ PLAN:   Diabetes type 2 with obesity and poor control See history of present illness for detailed discussion of current diabetes management, blood sugar patterns and problems identified  He is now starting the OMNIPOD insulin pump with U-500 insulin  Despite  his current basal rate of only 26 units/day his blood sugars are definitely lower than when he was on the V-go pump Also with about the same amount of boluses he is starting to get hypoglycemic including about 4 hours after dinner last night indicating more efficient insulin action  Will need to reduce his basal and bolus settings across-the-board as follows: Basal rate midnight = 0.7, 6 AM = 1.0, 12 noon = 1.1 and 9 PM = 0.8 Boluses for breakfast and lunch 6 units and dinner 8 units Needs to check blood sugars before and after each meal consistently and review again tomorrow Reminded him to have some protein with every meal including breakfast and avoid high fat meal Also reminded him to call if he has any low sugars this evening To bolus 30 minutes before meals Discussed blood sugar targets He will be shown how  to program his PDM by the nurse educator   HYPOTHYROIDISM: Will need follow-up levels, his levothyroxine was changed on the last visit  There are no Patient Instructions on file for this visit.   Counseling time on subjects discussed in assessment and plan sections is over 50% of today's 25 minute visit      Elayne Snare 02/07/2018, 12:07 PM   Note: This office note was prepared with Dragon voice recognition system technology. Any transcriptional errors that result from this process are unintentional.

## 2018-02-08 ENCOUNTER — Telehealth: Payer: Self-pay | Admitting: Endocrinology

## 2018-02-08 NOTE — Telephone Encounter (Signed)
Patient is unable to come in today for Pump start visit. Needing to give lastest blood sugar readings. Please advise

## 2018-02-08 NOTE — Telephone Encounter (Signed)
Noted  

## 2018-02-08 NOTE — Telephone Encounter (Signed)
Pt was called and his blood sugars were 127 before lunch and 117 at breakfast.

## 2018-02-09 ENCOUNTER — Encounter: Payer: Self-pay | Admitting: Endocrinology

## 2018-02-09 ENCOUNTER — Ambulatory Visit: Payer: Medicaid Other | Admitting: Endocrinology

## 2018-02-09 DIAGNOSIS — E1165 Type 2 diabetes mellitus with hyperglycemia: Secondary | ICD-10-CM | POA: Diagnosis not present

## 2018-02-09 DIAGNOSIS — Z794 Long term (current) use of insulin: Secondary | ICD-10-CM

## 2018-02-09 NOTE — Progress Notes (Signed)
Patient ID: Ethan Campbell, male   DOB: 1960/08/03, 57 y.o.   MRN: [id number]   Reason for Appointment: Diabetes follow-up   History of Present Illness   Diagnosis: Type 2 DIABETES MELITUS, date of diagnosis: 2000    Previous history: he has been on insulin for several years with consistently poor control Has been requiring large doses of insulin for his diabetes but A1c has been persistently high Blood sugars did not improve significantly even with trying Byetta and Victoza In 2014 he was switched from NovoLog to U-500 insulin but not clear if he has had improvement in control except with fasting readings His prior A1c was 14.0 in 5/14 and he had educational discussions with diabetes educator and dietitian in 6/14   Recent history:  Insulin regimen:  Humulin R U-500 with Omnipod PUMP  Non-insulin hypoglycemic drugs: Victoza 1.8 mg daily   He was started on the OMNIPOD pumps on 02/06/2018  Current settings: Basal rate: Midnight = 0.7, 6a.m. = 1.0, 12 noon = 1.1 and 9 PM = 0.8  Preset boluses: 6 units for lunch and 8 units dinner  A1c has been persistently very significantly high, last 13.4 in 2/19   Current management, blood sugar patterns and problems identified:  When starting the insulin pump he was starting to have relatively low blood sugars especially postprandially and blood sugars were low normal on Wednesday at his last visit  However he did not check his sugars on Wednesday evening and did not take any bolus  FASTING blood sugar was 117 yesterday and 196 this morning  POSTPRANDIAL reading after breakfast 126 yesterday but he has not had breakfast today  Blood sugar after lunch yesterday was 57 about 4-5 hours later  Today however his blood sugar was 196 on waking up and now in the office was over 300 without any breakfast or meal bolus  He is aware of how to change his pod  Currently has no difficulty doing his boluses on his own and appears to be  familiar with changing his settings   Mealtimes: 12 pm, 6 pm  Proper timing of medications in relation to meals: Yes, usually 30 minutes before eating .          Monitors blood glucose:  1-2 times a day.    Glucometer:  Accu-Chek     Blood Glucose readings from download as above:   Blood sugar average 321 when last seen in 10/2017   Physical activity: exercise:none   Last dietitian visit: 6/18 and last CDE visit in 4/16  Wt Readings from Last 3 Encounters:  02/07/18 288 lb (130.6 kg)  11/09/17 285 lb 9.6 oz (129.5 kg)  09/11/17 288 lb (130.6 kg)    LABS:   Lab Results  Component Value Date   HGBA1C 13.4 09/11/2017   HGBA1C 13.5 (H) 06/02/2017   HGBA1C 12.2 04/06/2017   Lab Results  Component Value Date   MICROALBUR 1.6 11/06/2017   LDLCALC 61 11/06/2017   CREATININE 2.51 (H) 11/06/2017    Lab Results  Component Value Date   FRUCTOSAMINE 512 (H) 11/06/2017   FRUCTOSAMINE 494 (H) 12/22/2016   FRUCTOSAMINE 471 (H) 11/14/2013      other active problems discussed in review of systems   Allergies as of 02/09/2018   No Known Allergies     Medication List        Accurate as of 02/09/18  1:02 PM. Always use your most recent med list.  amLODipine 10 MG tablet Commonly known as:  NORVASC Take 10 mg by mouth daily.   aspirin EC 81 MG tablet Take by mouth.   atorvastatin 80 MG tablet Commonly known as:  LIPITOR TAKE 1 TABLET DAILY.   benazepril 20 MG tablet Commonly known as:  LOTENSIN Take 20 mg by mouth daily.   DULoxetine 60 MG capsule Commonly known as:  CYMBALTA Take by mouth.   fenofibrate 145 MG tablet Commonly known as:  TRICOR Take 145 mg by mouth daily.   FLUoxetine 20 MG capsule Commonly known as:  PROZAC Take 20 mg by mouth daily.   furosemide 80 MG tablet Commonly known as:  LASIX Take by mouth 2 (two) times daily. Takes one tablet in the morning and one-half tablet in the evening   gabapentin 300 MG capsule Commonly  known as:  NEURONTIN Take by mouth.   glucose blood test strip Commonly known as:  ACCU-CHEK AVIVA PLUS TEST UP TO 4 TIMES DAILY Dx code E11.65   HUMULIN R 500 UNIT/ML injection Generic drug:  insulin regular human CONCENTRATED Inject into the skin. USE INSULIN TO FILL UP OMNIPOD EVERY 3 DAYS.   Insulin Syringe-Needle U-100 27G X 1/2" 1 ML Misc Use to fill V-go pump daily   levothyroxine 88 MCG tablet Commonly known as:  SYNTHROID, LEVOTHROID Take 1 tablet (88 mcg total) by mouth daily.   liraglutide 18 MG/3ML Sopn Commonly known as:  VICTOZA USE 1.8 MG SUB-Q EACH DAY.   meclizine 25 MG tablet Commonly known as:  ANTIVERT Take 25 mg by mouth 3 (three) times daily as needed for dizziness.   midodrine 5 MG tablet Commonly known as:  PROAMATINE Take by mouth.   OMNIPOD 5 PACK Misc by Does not apply route.   potassium chloride 10 MEQ tablet Commonly known as:  K-DUR Take by mouth.   tamsulosin 0.4 MG Caps capsule Commonly known as:  FLOMAX Take 0.4 mg by mouth.   tiZANidine 4 MG tablet Commonly known as:  ZANAFLEX Take 4 mg by mouth 2 (two) times daily.   warfarin 10 MG tablet Commonly known as:  COUMADIN Take by mouth.   warfarin 7.5 MG tablet Commonly known as:  COUMADIN Take 10 mg by mouth.       Allergies: No Known Allergies  Past Medical History:  Diagnosis Date  . Essential hypertension   . History of stroke   . Hyperlipidemia   . Insomnia   . Obesity   . Precordial pain June 2011   Nuclear stress; no ischemia; EF 60%  . Proteinuria   . Sleep apnea    Dr. Brandon Melnick  . Type 2 diabetes mellitus ([place])   . Venous insufficiency     Past Surgical History:  Procedure Laterality Date  . RESECTION BONE TUMOR FEMUR  1980's   Left femur, [facility] at [facility] with bone graft  . TOTAL KNEE ARTHROPLASTY Left 2013    Family History  Problem Relation Age of Onset  . Heart attack Mother 21  . Breast cancer Mother   . Stroke Mother   . Heart attack  Father 6  . Breast cancer Sister   . Colon cancer Sister     Social History:  reports that he has quit smoking. His smoking use included cigarettes. He has never used smokeless tobacco. He reports that he does not drink alcohol or use drugs.  Review of Systems:  Following is a copy of the previous note:  Hypothyroid, treated by PCP, reportedly  on  levothyroxine 75ug daily And followed by PCP Recent TSH is relatively high   Lab Results  Component Value Date   TSH 4.58 (H) 11/06/2017   He says he has had syncopal spells, not always associated with low blood pressure Neurologist indicated that he may have vertebral artery flow issues   He is still taking midodrine from various physicians for orthostatic hypotension who are following his syncopal episodes  Lipids: triglycerides on labs on the last measurement are down to 161 with LDL 92  Followed by PCP   Lab Results  Component Value Date   CHOL 126 11/06/2017   HDL 34.40 (L) 11/06/2017   LDLCALC 61 11/06/2017   LDLDIRECT 80.0 03/07/2016   TRIG 155.0 (H) 11/06/2017   CHOLHDL 4 11/06/2017      CKD: His creatinine has been In the 2-3 range Creatinine recently has been as follows:    Lab Results  Component Value Date   CREATININE 2.51 (H) 11/06/2017   He has been told to have CHF In the past and is still taking Lasix    Examination:   There were no vitals taken for this visit.  There is no height or weight on file to calculate BMI.     ASSESSMENT/ PLAN:   Diabetes type 2 with obesity and poor control See history of present illness for detailed discussion of current diabetes management, blood sugar patterns and problems identified  OMNIPOD insulin pump with U-500 insulin  Overall his blood sugars are excellent with fairly good breathing yesterday He did have a low sugar after lunch yesterday possibly from overlapping boluses for breakfast and lunch with a total insulin dose of nearly 12 units Also after  evening meal with only 1 serving of carbohydrate his blood sugar by 1 AM was down to 79  Not clear however why his blood sugar is rising this morning without any meals or boluses and is up to 300 today which is unusual  For now we will reduce his boluses down to 7 units at dinnertime instead of 8 and he will need to go up or down 1 or 2 units based on how much carbohydrate he is eating Also if he is having 2 meals within 6 hours he will need to do less insulin for his second meal  Although the basal rate will be increased from 5 AM until noon up to 1.15 He will call blood sugar readings on Monday  Patient Instructions  Call Monday  Basal 1.1 at 5 am  Bolus 7 at dinner         Elayne Snare 02/09/2018, 1:02 PM   Note: This office note was prepared with Dragon voice recognition system technology. Any transcriptional errors that result from this process are unintentional.

## 2018-02-09 NOTE — Patient Instructions (Signed)
Call Monday  Basal 1.1 at 5 am  Bolus 7 at dinner

## 2018-02-13 NOTE — Patient Instructions (Signed)
REad over manual and call if questions. Call if blood sugar readings go high, or drop low

## 2018-02-13 NOTE — Progress Notes (Signed)
We reviewed all topics on the the checklist, and patient signed off as understanding all topics and had no final questions. Basal rate was changed per Dr. Ronnie Derby order by the patient to Basal rate: MN: 09.7, 6AM: 1.0, Noon: 1.1, 9PM: 0.8.  Presets were also changed:  Bfast: 60 carbs, Lunch: 60 carbs, Supper: 80 carbs.   We reviewed sick day guidelines, high blood sugar protocol, and temp basal rates--when and how to change them.  He had no final questions.

## 2018-02-14 ENCOUNTER — Telehealth: Payer: Self-pay | Admitting: Nutrition

## 2018-02-14 NOTE — Telephone Encounter (Signed)
Since he is not reporting low sugars after dinner he can reduce his 5 AM basal rate down to 1.0

## 2018-02-14 NOTE — Telephone Encounter (Signed)
Patient said that he called [employer], and they told him that his computer was not able to get [place]. Said FBSs are all less than 120.  Today it was 70, because he did not eat much last night.  Said ac meals are between 120-137.  Today acL was 127, and acS are all less than 140.   He was told to call if another low, less than 70.  He agreed to do this.

## 2018-02-19 NOTE — Telephone Encounter (Signed)
Message left on machine of basal reduction needed per Dr. Ronnie Derby order below.  He was told to call the help number, if he is not able to do this on his own.

## 2018-02-21 ENCOUNTER — Ambulatory Visit (INDEPENDENT_AMBULATORY_CARE_PROVIDER_SITE_OTHER): Payer: Medicaid Other | Admitting: Endocrinology

## 2018-02-21 ENCOUNTER — Encounter: Payer: Self-pay | Admitting: Endocrinology

## 2018-02-21 VITALS — BP 60/40 | HR 76 | Ht 68.5 in | Wt 294.6 lb

## 2018-02-21 DIAGNOSIS — I951 Orthostatic hypotension: Secondary | ICD-10-CM

## 2018-02-21 DIAGNOSIS — E1165 Type 2 diabetes mellitus with hyperglycemia: Secondary | ICD-10-CM

## 2018-02-21 DIAGNOSIS — Z794 Long term (current) use of insulin: Secondary | ICD-10-CM | POA: Diagnosis not present

## 2018-02-21 LAB — POCT GLYCOSYLATED HEMOGLOBIN (HGB A1C): HEMOGLOBIN A1C: 10.9 % — AB (ref 4.0–5.6)

## 2018-02-21 NOTE — Patient Instructions (Signed)
Basal rate at 5 AM = 0.9  Check some sugars 2 hrs after meals

## 2018-02-21 NOTE — Progress Notes (Signed)
Patient ID: Ethan Campbell, male   DOB: 1961-05-02, 57 y.o.   MRN: [id number]   Reason for Appointment: Diabetes follow-up   History of Present Illness   Diagnosis: Type 2 DIABETES MELITUS, date of diagnosis: 2000    Previous history: he has been on insulin for several years with consistently poor control Has been requiring large doses of insulin for his diabetes but A1c has been persistently high Blood sugars did not improve significantly even with trying Byetta and Victoza In 2014 he was switched from NovoLog to U-500 insulin but not clear if he has had improvement in control except with fasting readings His prior A1c was 14.0 in 5/14 and he had educational discussions with diabetes educator and dietitian in 6/14   Recent history:  Insulin regimen:  Humulin R U-500 with Omnipod PUMP  Non-insulin hypoglycemic drugs: Victoza 1.8 mg daily   He was started on the OMNIPOD pumps on 02/06/2018  Current settings: Basal rate: Midnight = 0.7,  5a.m. = 1.0, 12 noon = 1.1 and 9 PM = 0.8  Preset boluses: 6 units for lunch and 8 units dinner  A1c has been persistently very significantly high, now improving at 10.9 compared to 13.4 earlier this year   Current management, blood sugar patterns and problems identified:  His blood sugars are again remarkably better with using the Omnipod insulin pump  Even though on his last visit his sugar was high late morning he started having low blood sugars in the late morning after increasing his basal rate up to 1.15 at 5 AM  However even with the basal rate of 1.0 in the morning his blood sugars have been low normal including a reading of 52 yesterday  Mostly not eating breakfast in the morning until about 10-11 AM  However usually not checking blood sugars after meals about 2 hours later as directed  Recently blood sugars around suppertime have been mostly fairly good with only one unusually high reading of 330 possibly from eating a  large lunch which caused blood sugars to be high the next morning also  Recently low sugars documented only yesterday morning  He is doing well with trying to bolus when he is eating, trying to do 30 minutes before the meal  He is also able to bolus without difficulty and is now able to make changes in his basal rates on his own   Mealtimes: 12 pm, 6 pm  Proper timing of medications in relation to meals: Yes, usually 30 minutes before eating .          Monitors blood glucose:  1-2 times a day.    Glucometer:  Accu-Chek     Blood Glucose readings from download: Only recent values entered  Mean values apply above for all meters except median for One Touch  PRE-MEAL Fasting Lunch Dinner Bedtime Overall  Glucose range:  52-188  54-136  82-330  90-286   Mean/median:     150   POST-MEAL PC Breakfast PC Lunch PC Dinner  Glucose range:  67    Mean/median:        Physical activity: exercise:none   Last dietitian visit: 6/18 and last CDE visit in 4/16  Wt Readings from Last 3 Encounters:  02/21/18 294 lb 9.6 oz (133.6 kg)  02/07/18 288 lb (130.6 kg)  11/09/17 285 lb 9.6 oz (129.5 kg)    LABS:   Lab Results  Component Value Date   HGBA1C 10.9 (A) 02/21/2018   HGBA1C  13.4 09/11/2017   HGBA1C 13.5 (H) 06/02/2017   Lab Results  Component Value Date   MICROALBUR 1.6 11/06/2017   LDLCALC 61 11/06/2017   CREATININE 2.51 (H) 11/06/2017    Lab Results  Component Value Date   FRUCTOSAMINE 512 (H) 11/06/2017   FRUCTOSAMINE 494 (H) 12/22/2016   FRUCTOSAMINE 471 (H) 11/14/2013      other active problems discussed in review of systems   Allergies as of 02/21/2018   No Known Allergies     Medication List        Accurate as of 02/21/18  9:00 PM. Always use your most recent med list.          amLODipine 10 MG tablet Commonly known as:  NORVASC Take 10 mg by mouth daily.   aspirin EC 81 MG tablet Take by mouth.   atorvastatin 80 MG tablet Commonly known as:   LIPITOR TAKE 1 TABLET DAILY.   benazepril 20 MG tablet Commonly known as:  LOTENSIN Take 20 mg by mouth daily.   DULoxetine 60 MG capsule Commonly known as:  CYMBALTA Take by mouth.   fenofibrate 145 MG tablet Commonly known as:  TRICOR Take 145 mg by mouth daily.   FLUoxetine 20 MG capsule Commonly known as:  PROZAC Take 20 mg by mouth daily.   furosemide 80 MG tablet Commonly known as:  LASIX Take by mouth 2 (two) times daily. Takes one tablet in the morning and one-half tablet in the evening   gabapentin 300 MG capsule Commonly known as:  NEURONTIN Take by mouth.   glucose blood test strip Commonly known as:  ACCU-CHEK AVIVA PLUS TEST UP TO 4 TIMES DAILY Dx code E11.65   HUMULIN R 500 UNIT/ML injection Generic drug:  insulin regular human CONCENTRATED Inject into the skin. USE INSULIN TO FILL UP OMNIPOD EVERY 3 DAYS.   levothyroxine 88 MCG tablet Commonly known as:  SYNTHROID, LEVOTHROID Take 1 tablet (88 mcg total) by mouth daily.   liraglutide 18 MG/3ML Sopn Commonly known as:  VICTOZA USE 1.8 MG SUB-Q EACH DAY.   meclizine 25 MG tablet Commonly known as:  ANTIVERT Take 25 mg by mouth 3 (three) times daily as needed for dizziness.   midodrine 5 MG tablet Commonly known as:  PROAMATINE Take by mouth.   OMNIPOD 5 PACK Misc by Does not apply route.   potassium chloride 10 MEQ tablet Commonly known as:  K-DUR Take by mouth.   tamsulosin 0.4 MG Caps capsule Commonly known as:  FLOMAX Take 0.4 mg by mouth.   tiZANidine 4 MG tablet Commonly known as:  ZANAFLEX Take 4 mg by mouth 2 (two) times daily.   warfarin 10 MG tablet Commonly known as:  COUMADIN Take by mouth. TAKE 1 TABLET BY MOUTH EVERY DAY.       Allergies: No Known Allergies  Past Medical History:  Diagnosis Date  . Essential hypertension   . History of stroke   . Hyperlipidemia   . Insomnia   . Obesity   . Precordial pain June 2011   Nuclear stress; no ischemia; EF 60%  .  Proteinuria   . Sleep apnea    Dr. Brandon Melnick  . Type 2 diabetes mellitus ([place])   . Venous insufficiency     Past Surgical History:  Procedure Laterality Date  . RESECTION BONE TUMOR FEMUR  1980's   Left femur, treated at [facility] with bone graft  . TOTAL KNEE ARTHROPLASTY Left 2013    Family History  Problem Relation Age of Onset  . Heart attack Mother 23  . Breast cancer Mother   . Stroke Mother   . Heart attack Father 84  . Breast cancer Sister   . Colon cancer Sister     Social History:  reports that he has quit smoking. His smoking use included cigarettes. He has never used smokeless tobacco. He reports that he does not drink alcohol or use drugs.  Review of Systems:     HYPOTHYROIDISM: Treated by PCP, now on  levothyroxine 88 ug daily And followed by PCP Labs from PCP office not available   Lab Results  Component Value Date   TSH 4.58 (H) 11/06/2017   SYNCOPAL episode  She is now being followed by cardiologist Neurologist indicated that he may have vertebral artery flow issues   He is still taking midodrine but he thinks that even with high doses as much as 15 mg 3 times daily he did not have any improvement He is supposed to get elastic stockings but he is not able to get this from his local pharmacy for various reasons He is not having any episodes but he gets lightheaded when he stands up frequently and is not able to drive   Lipids: triglycerides on labs on the last measurement are down to 161 with LDL 92  Followed by PCP   Lab Results  Component Value Date   CHOL 126 11/06/2017   HDL 34.40 (L) 11/06/2017   LDLCALC 61 11/06/2017   LDLDIRECT 80.0 03/07/2016   TRIG 155.0 (H) 11/06/2017   CHOLHDL 4 11/06/2017      CKD: His creatinine has been In the 2-3 range Creatinine recently has been as follows:    Lab Results  Component Value Date   CREATININE 2.51 (H) 11/06/2017   He has been told to have CHF In the past and is still taking Lasix He  thinks that he is having good control with total of 120 mg Lasix daily prescribed by his PCP but cannot take less because of edema    Examination:   BP (!) 60/40   Pulse 76   Ht 5' 8.5" (1.74 m)   Wt 294 lb 9.6 oz (133.6 kg)   SpO2 98%   BMI 44.14 kg/m   Body mass index is 44.14 kg/m.   No ankle edema present  Standing blood pressure 60/40  ASSESSMENT/ PLAN:   Diabetes type 2 with obesity and poor control See history of present illness for detailed discussion of current diabetes management, blood sugar patterns and problems identified  Recently on OMNIPOD insulin pump with U-500 insulin  Overall his blood sugars are much more stable and fairly close to normal with a recent average of 142 in the last few days He still has a tendency to relatively low sugars in the morning hours However not checking enough readings after meals and this was discussed Timing of glucose monitoring and targets at various times discussed Currently not adjusting his bolus esenough when he is eating different size meals   Day-to-day management of his pump, boluses, blood sugar monitoring, blood sugar targets, bolus adjustment and action of insulin pump discussed  His basal rate will be decreased from 5 AM until noon down to 0.9  ORTHOSTATIC hypotension: He still has significant orthostatic changes not benefited by midodrine and is not a candidate for Florinef because of history of edema and CHF This may not work with renal failure also Most likely he may benefit from droxidopa although  this is unlikely to be available on Medicaid, will start prior authorization  Hypothyroidism: Will need repeat thyroid levels  Counseling time on subjects discussed in assessment and plan sections is over 50% of today's 25 minute visit  Patient Instructions  Basal rate at 5 AM = 0.9  Check some sugars 2 hrs after meals         Elayne Snare 02/21/2018, 9:00 PM   Note: This office note was prepared with  Dragon voice recognition system technology. Any transcriptional errors that result from this process are unintentional.

## 2018-02-22 ENCOUNTER — Telehealth: Payer: Self-pay

## 2018-02-22 NOTE — Telephone Encounter (Signed)
I called [employer] support to get assistance completing a prior authorization for the pt to receive Droxidopa. The help line stated that because it is a limited distribution drug, the pt must first be enrolled into the [facility] program. I printed off the [employer] treatment form that must be filled out, signed by both the MD and the patient and then faxed back to [facility] support.  They will review all documentation, contact the pt's insurance on their behalf and see what is needed for the pt to obtain the medication as well as which mail order pharmacy the pt is permitted to use.  If a PA is needed, [employer] support with notify this office and provide [place] with information as to whom to the PA must be submitted.

## 2018-02-28 ENCOUNTER — Telehealth: Payer: Self-pay | Admitting: Endocrinology

## 2018-02-28 ENCOUNTER — Other Ambulatory Visit: Payer: Self-pay

## 2018-02-28 MED ORDER — GLUCOSE BLOOD VI STRP: ORAL_STRIP | 3 refills | Status: DC

## 2018-02-28 NOTE — Telephone Encounter (Signed)
I have sent prescription for 200 strips instead of 100 to [place].

## 2018-02-28 NOTE — Telephone Encounter (Signed)
Patient needs RX for test strips (he used to get 2 bottles per month but he is now getting only 1) sent to [facility] in [place]. He is testing 8-9 times per day. He needs them asap otherwise he won't be able to get any test strips until next Friday and he is completely out.

## 2018-03-12 ENCOUNTER — Telehealth: Payer: Self-pay | Admitting: Endocrinology

## 2018-03-12 NOTE — Telephone Encounter (Signed)
[facility] is calling in regards to forms that they have received on Friday. She stated that some where not complete and others were not signed. They would like a call back to discuss this and see if maybe there was some things that the doctor was not approving or ordering    Please advise Ethan Campbell  [phone]

## 2018-03-14 NOTE — Telephone Encounter (Signed)
Paperwork given back to Dr. Dwyane Dee for him to sign and fill out what was missing

## 2018-03-15 ENCOUNTER — Telehealth: Payer: Self-pay | Admitting: Endocrinology

## 2018-03-15 NOTE — Telephone Encounter (Signed)
[facility] is calling to check the status of paperwork that was faxed over aug 7th. They stated they received some paperwork back and it was incomplete  Would like to know If these were incomplete because it was not approved    Phone-[phone] Fax- [phone]

## 2018-03-22 ENCOUNTER — Telehealth: Payer: Self-pay | Admitting: Emergency Medicine

## 2018-03-22 NOTE — Telephone Encounter (Signed)
[facility] called and wants to know if you received the fax they sent over for the patients diabetic supplies, Compressions, Shoes. Thanks.

## 2018-03-22 NOTE — Telephone Encounter (Signed)
This form is on MD desk awaiting signature

## 2018-03-27 NOTE — Telephone Encounter (Signed)
Received paper work and Per Dr.Kumar will not be completed and signed until patient is scheduled for a foot exam. Called and spoke with patients finance Dillard her to have patient call and schedule a follow up appointment with Dr. Dwyane Dee. Understanding verbalized nothing further needed.

## 2018-03-27 NOTE — Telephone Encounter (Signed)
Placed in sign folder on Dr. Ronnie Derby desk as requested that they be completed today so I can fax them ASAP.

## 2018-03-29 ENCOUNTER — Other Ambulatory Visit: Payer: Self-pay | Admitting: Endocrinology

## 2018-04-04 ENCOUNTER — Telehealth: Payer: Self-pay | Admitting: Emergency Medicine

## 2018-04-04 NOTE — Telephone Encounter (Signed)
[facility] called and wants to know if you have received paper work for Diabetic supplies and diabetic shoes. Please give them a call and let them know if you have received them. Thanks.

## 2018-04-06 ENCOUNTER — Other Ambulatory Visit: Payer: Self-pay | Admitting: Endocrinology

## 2018-04-06 DIAGNOSIS — E1165 Type 2 diabetes mellitus with hyperglycemia: Secondary | ICD-10-CM

## 2018-04-06 DIAGNOSIS — Z794 Long term (current) use of insulin: Secondary | ICD-10-CM

## 2018-04-11 ENCOUNTER — Telehealth: Payer: Self-pay | Admitting: Endocrinology

## 2018-04-11 NOTE — Telephone Encounter (Signed)
[facility] is wanting to check the status of a few forms that were faxed over to our office. It is for the DM supplies, shoes and freestlye libra  Ph- [phone]

## 2018-04-13 NOTE — Telephone Encounter (Signed)
Do you have this paperwork?

## 2018-04-13 NOTE — Telephone Encounter (Signed)
See below

## 2018-04-13 NOTE — Telephone Encounter (Signed)
FYI:   It is testing supplies and the [employer], form is being re-faxed.

## 2018-04-13 NOTE — Telephone Encounter (Signed)
Please find out what diabetic supplies.  If they are ordering Dexcom then I have not agreed to prescribe this.  Have already filled out forms for the shoes

## 2018-04-13 NOTE — Telephone Encounter (Signed)
Duplicate

## 2018-04-23 ENCOUNTER — Ambulatory Visit: Payer: Medicaid Other | Admitting: Endocrinology

## 2018-04-27 ENCOUNTER — Telehealth: Payer: Self-pay

## 2018-04-27 NOTE — Telephone Encounter (Signed)
Re-faxed today by Vicente Males

## 2018-04-27 NOTE — Telephone Encounter (Signed)
Douglas left message with [facility] checking the status of paperwork that was faxed for this patient- please call Nevin Bloodgood at [phone]

## 2018-04-30 ENCOUNTER — Telehealth: Payer: Self-pay | Admitting: Endocrinology

## 2018-04-30 NOTE — Telephone Encounter (Signed)
Patient is stating that Christean Grief. told him to call when he was running low on omni pods.  Please Advise. Ph # [phone]

## 2018-05-01 ENCOUNTER — Telehealth: Payer: Self-pay | Admitting: Endocrinology

## 2018-05-01 ENCOUNTER — Other Ambulatory Visit: Payer: Self-pay

## 2018-05-01 MED ORDER — OMNIPOD CLASSIC PODS (GEN 3) MISC: 1.0000 | 3 refills | Status: DC

## 2018-05-01 NOTE — Telephone Encounter (Signed)
This has been sent

## 2018-05-01 NOTE — Telephone Encounter (Signed)
I have sent in new prescription

## 2018-05-01 NOTE — Telephone Encounter (Signed)
Patient needs Rx for Omnipod Disposable 5 pack sent to [facility] in [place]. Patient has been out.

## 2018-05-08 ENCOUNTER — Telehealth: Payer: Self-pay | Admitting: Endocrinology

## 2018-05-08 NOTE — Telephone Encounter (Signed)
Ethan Campbell with [place] ph# [phone] called re: following up on forms that were sent bto Dr. Dwyane Dee including the following: 1.Diabetic supplies 2. Diabetic shoes, 3. Letter for Medical Necessity for CGM. Please call Ethan Campbell at the ph# listed above.

## 2018-05-10 NOTE — Telephone Encounter (Signed)
Can you see if this has been done

## 2018-05-11 NOTE — Telephone Encounter (Signed)
I gave the paper work to [employer] to verified is been done and faxed.

## 2018-05-14 ENCOUNTER — Telehealth: Payer: Self-pay | Admitting: Endocrinology

## 2018-05-14 NOTE — Telephone Encounter (Signed)
"  Ethan Campbell with [facility] ph# [phone] called re: following up on forms that were sent bto Dr. Dwyane Dee including the following: 1.Diabetic supplies 2. Diabetic shoes, 3. Letter for Medical Necessity for CGM. Please call Ethan Campbell at the ph# listed above."  [facility] has called again stating they did not receive documention. Please fax [phone]

## 2018-05-17 ENCOUNTER — Telehealth: Payer: Self-pay | Admitting: Endocrinology

## 2018-05-17 NOTE — Telephone Encounter (Signed)
Received CMN fax that came from [facility] for Insulin Pump Supplies. Since Dwyane Dee is out of the office, Doctor on Call, Dr. Cruzita Lederer signed paperwork and this has been faxed to [phone]

## 2018-05-29 ENCOUNTER — Telehealth: Payer: Self-pay | Admitting: Endocrinology

## 2018-05-29 NOTE — Telephone Encounter (Signed)
Ethan Campbell from [facility] medical is calling in regards to Patient [name], There is also info missing on paperwork for patients diabetic shoes, DM supplies they are needing pre fill MD orders signed and returned.     [name] Phone- [phone]

## 2018-05-30 NOTE — Telephone Encounter (Signed)
All paperwork has been re-faxed with appropriate changes

## 2018-06-11 ENCOUNTER — Telehealth: Payer: Self-pay | Admitting: Endocrinology

## 2018-06-11 NOTE — Telephone Encounter (Signed)
Wells Guiles from pharmacy called requesting information and/or RX for Diabetic Shoes for this patient

## 2018-06-11 NOTE — Telephone Encounter (Signed)
Has this all been re-faxed?

## 2018-06-14 NOTE — Telephone Encounter (Signed)
DM shoe--[facility] [employer]been sign and faxed on 05/25/18 with conformation...and will refaxed it again [phone]

## 2018-06-15 ENCOUNTER — Telehealth: Payer: Self-pay | Admitting: Endocrinology

## 2018-06-15 ENCOUNTER — Encounter: Payer: Self-pay | Admitting: Endocrinology

## 2018-06-15 ENCOUNTER — Ambulatory Visit: Payer: Medicaid Other | Admitting: Endocrinology

## 2018-06-15 VITALS — BP 102/60 | HR 74 | Ht 69.0 in | Wt 307.0 lb

## 2018-06-15 DIAGNOSIS — E039 Hypothyroidism, unspecified: Secondary | ICD-10-CM | POA: Diagnosis not present

## 2018-06-15 DIAGNOSIS — L03031 Cellulitis of right toe: Secondary | ICD-10-CM

## 2018-06-15 DIAGNOSIS — E1165 Type 2 diabetes mellitus with hyperglycemia: Secondary | ICD-10-CM

## 2018-06-15 DIAGNOSIS — L97509 Non-pressure chronic ulcer of other part of unspecified foot with unspecified severity: Secondary | ICD-10-CM

## 2018-06-15 DIAGNOSIS — E1143 Type 2 diabetes mellitus with diabetic autonomic (poly)neuropathy: Secondary | ICD-10-CM | POA: Diagnosis not present

## 2018-06-15 DIAGNOSIS — Z794 Long term (current) use of insulin: Secondary | ICD-10-CM

## 2018-06-15 DIAGNOSIS — E11621 Type 2 diabetes mellitus with foot ulcer: Secondary | ICD-10-CM | POA: Diagnosis not present

## 2018-06-15 LAB — BASIC METABOLIC PANEL
BUN: 33 mg/dL — ABNORMAL HIGH (ref 6–23)
CO2: 30 meq/L (ref 19–32)
CREATININE: 2 mg/dL — AB (ref 0.40–1.50)
Calcium: 9.2 mg/dL (ref 8.4–10.5)
Chloride: 102 mEq/L (ref 96–112)
GFR: 36.76 mL/min — ABNORMAL LOW (ref >60.00)
GLUCOSE: 132 mg/dL — AB (ref 70–99)
Potassium: 4.2 mEq/L (ref 3.5–5.1)
SODIUM: 138 meq/L (ref 135–145)

## 2018-06-15 LAB — T4, FREE: Free T4: 0.81 ng/dL (ref 0.60–1.60)

## 2018-06-15 LAB — POCT GLYCOSYLATED HEMOGLOBIN (HGB A1C): Hemoglobin A1C: 6.9 % — AB (ref 4.0–5.6)

## 2018-06-15 LAB — TSH: TSH: 5.92 u[IU]/mL — AB (ref 0.35–4.50)

## 2018-06-15 MED ORDER — AMOXICILLIN-POT CLAVULANATE 500-125 MG PO TABS: 1.0000 | ORAL_TABLET | Freq: Two times a day (BID) | ORAL | 0 refills | Status: DC

## 2018-06-15 NOTE — Telephone Encounter (Signed)
Freestyle lite checking 3 times a day

## 2018-06-15 NOTE — Progress Notes (Signed)
Patient ID: Ethan Campbell, male   DOB: Jul 15, 1961, 57 y.o.   MRN: [id number]   Reason for Appointment: Diabetes follow-up   History of Present Illness   Diagnosis: Type 2 DIABETES MELITUS, date of diagnosis: 2000    Previous history: he has been on insulin for several years with consistently poor control Has been requiring large doses of insulin for his diabetes but A1c has been persistently high Blood sugars did not improve significantly even with trying Byetta and Victoza In 2014 he was switched from NovoLog to U-500 insulin but not clear if he has had improvement in control except with fasting readings His prior A1c was 14.0 in 5/14 and he had educational discussions with diabetes educator and dietitian in 6/14   Recent history:  Insulin regimen:  Humulin R U-500 with Omnipod PUMP  Non-insulin hypoglycemic drugs: Victoza 1.8 mg daily   He was started on the OMNIPOD pumps on 02/06/2018  Current settings: Basal rate: Midnight =0.5,   5a.m. = 0.65, 12 noon = 0.9 and 9 PM = 0.8  Preset boluses: 6 units for lunch and 8 units dinner  A1c has previously been persistently very significantly high, now dramatically improved at 6.9 compared to 10.9   Current management, blood sugar patterns and problems identified:  His blood sugars are again overall better with using the Omnipod insulin pump  Although his blood sugar monitoring is somewhat erratic and unable to review all his readings on his Accu-Chek meter his averages about the same  He has not been seen in follow-up for almost 4 months  His blood sugar at any given time of the day is ranging between 101 and 207  His mealtimes are somewhat erratic  Occasionally has high readings from not bolusing when he is not taking his PDM while away from home and eating a meal  However he is still trying to bolus 30 with before eating  Recently hypoglycemia has been minimal  Occasionally may have higher readings after  meals but these are not being monitored enough, most likely from not entering enough carbohydrates or having a relatively higher fat meal  His weight has increased significantly from improved blood sugar  Has only a couple of high readings in the mornings before breakfast but mostly when not being able to replace his OmniPod consistently   Mealtimes: 12 pm, 6 pm  Proper timing of medications in relation to meals: Yes, usually 30 minutes before eating .          Monitors blood glucose:  Up to 4 times a day.    Glucometer:  Accu-Chek  [facility]    Blood Glucose readings from download:    PRE-MEAL Fasting Lunch Dinner Bedtime Overall  Glucose range:  123-190  78-327  75-224    Mean/median:     162   PREVIOUS readings:  Mean values apply above for all meters except median for One Touch  PRE-MEAL Fasting Lunch Dinner Bedtime Overall  Glucose range:  52-188  54-136  82-330  90-286   Mean/median:     150   POST-MEAL PC Breakfast PC Lunch PC Dinner  Glucose range:  67    Mean/median:        Physical activity: exercise:none   Last dietitian visit: 6/18 and last CDE visit in 4/16  Wt Readings from Last 3 Encounters:  06/15/18 (!) 307 lb (139.3 kg)  02/21/18 294 lb 9.6 oz (133.6 kg)  02/07/18 288 lb (130.6 kg)  LABS:   Lab Results  Component Value Date   HGBA1C 6.9 (A) 06/15/2018   HGBA1C 10.9 (A) 02/21/2018   HGBA1C 13.4 09/11/2017   Lab Results  Component Value Date   MICROALBUR 1.6 11/06/2017   LDLCALC 61 11/06/2017   CREATININE 2.00 (H) 06/15/2018    Lab Results  Component Value Date   FRUCTOSAMINE 512 (H) 11/06/2017   FRUCTOSAMINE 494 (H) 12/22/2016   FRUCTOSAMINE 471 (H) 11/14/2013    OTHER active problems are discussed in review of systems   Allergies as of 06/15/2018   No Known Allergies     Medication List        Accurate as of 06/15/18 11:59 PM. Always use your most recent med list.          amLODipine 10 MG tablet Commonly known as:   NORVASC Take 10 mg by mouth daily.   amoxicillin-clavulanate 500-125 MG tablet Commonly known as:  AUGMENTIN Take 1 tablet (500 mg total) by mouth 2 (two) times daily.   aspirin EC 81 MG tablet Take by mouth.   atorvastatin 80 MG tablet Commonly known as:  LIPITOR TAKE 1 TABLET DAILY.   benazepril 20 MG tablet Commonly known as:  LOTENSIN Take 20 mg by mouth daily.   DULoxetine 60 MG capsule Commonly known as:  CYMBALTA Take by mouth.   fenofibrate 145 MG tablet Commonly known as:  TRICOR Take 145 mg by mouth daily.   FLUoxetine 20 MG capsule Commonly known as:  PROZAC Take 20 mg by mouth daily.   furosemide 80 MG tablet Commonly known as:  LASIX Take by mouth 2 (two) times daily. Takes one tablet in the morning and one-half tablet in the evening   gabapentin 300 MG capsule Commonly known as:  NEURONTIN Take by mouth.   glucose blood test strip TEST UP TO 4 TIMES DAILY Dx code E11.65   HUMULIN R 500 UNIT/ML injection Generic drug:  insulin regular human CONCENTRATED Inject into the skin. USE INSULIN TO FILL UP OMNIPOD EVERY 3 DAYS.   levothyroxine 88 MCG tablet Commonly known as:  SYNTHROID, LEVOTHROID Take 1 tablet (88 mcg total) by mouth daily.   liraglutide 18 MG/3ML Sopn Commonly known as:  VICTOZA USE 1.8MG  SUBCUTANEOUSLY DAILY.   meclizine 25 MG tablet Commonly known as:  ANTIVERT Take 25 mg by mouth 3 (three) times daily as needed for dizziness.   midodrine 5 MG tablet Commonly known as:  PROAMATINE Take by mouth.   OMNIPOD 5 PACK Misc 1 Package by Does not apply route every 3 (three) days.   potassium chloride 10 MEQ tablet Commonly known as:  K-DUR Take by mouth.   tamsulosin 0.4 MG Caps capsule Commonly known as:  FLOMAX Take 0.4 mg by mouth.   tiZANidine 4 MG tablet Commonly known as:  ZANAFLEX Take 4 mg by mouth 2 (two) times daily.   warfarin 10 MG tablet Commonly known as:  COUMADIN Take by mouth. TAKE 1 TABLET BY MOUTH EVERY  DAY.       Allergies: No Known Allergies  Past Medical History:  Diagnosis Date  . Essential hypertension   . History of stroke   . Hyperlipidemia   . Insomnia   . Obesity   . Precordial pain June 2011   Nuclear stress; no ischemia; EF 60%  . Proteinuria   . Sleep apnea    Dr. Brandon Melnick  . Type 2 diabetes mellitus ([place])   . Venous insufficiency     Past Surgical  History:  Procedure Laterality Date  . RESECTION BONE TUMOR FEMUR  1980's   Left femur, treated at [facility] with bone graft  . TOTAL KNEE ARTHROPLASTY Left 2013    Family History  Problem Relation Age of Onset  . Heart attack Mother 34  . Breast cancer Mother   . Stroke Mother   . Heart attack Father 7  . Breast cancer Sister   . Colon cancer Sister     Social History:  reports that he has quit smoking. His smoking use included cigarettes. He has never used smokeless tobacco. He reports that he does not drink alcohol or use drugs.  Review of Systems:  FOOT ulcer:  He says he has been working with his PCP to treat the diabetic foot ulcer on the right big toe and also has been to the urgent care center He says that despite taking cephalexin a couple of weeks ago his toe infection is not better and it is still needing dressing Not clear what other antibiotics he has taken for this He has insensate feet Has been recommended diabetic shoes Currently using open shoe on the right foot because of infection  HYPOTHYROIDISM: Treated by PCP, on  levothyroxine 88 ug daily He is followed by PCP Labs from PCP office not available   Lab Results  Component Value Date   TSH 5.92 (H) 06/15/2018   SYNCOPAL episodes:  Neurologist indicated that he may have vertebral artery flow issues  He has not had any syncopal episodes recently With midodrine he has not benefited much His lightheadedness is also somewhat less than before Continues to take antihypertensives with Norvasc from his nephrologist He has also been  recommended elastic stockings which he has not been able to obtain  Lipids: triglycerides on labs on the last measurement are below 200  Followed by PCP   Lab Results  Component Value Date   CHOL 126 11/06/2017   HDL 34.40 (L) 11/06/2017   LDLCALC 61 11/06/2017   LDLDIRECT 80.0 03/07/2016   TRIG 155.0 (H) 11/06/2017   CHOLHDL 4 11/06/2017      CKD: His creatinine has been In the 2-3 range, no reports available from nephrologist Creatinine recently has been as follows:    Lab Results  Component Value Date   CREATININE 2.00 (H) 06/15/2018   He has been told to have CHF In the past and is taking Lasix using 80 mg tablet    Examination:   BP 102/60 (Patient Position: Standing, Cuff Size: Large)   Pulse 74   Ht 5\' 9"  (1.753 m)   Wt (!) 307 lb (139.3 kg)   SpO2 98%   BMI 45.34 kg/m   Body mass index is 45.34 kg/m.   No ankle edema present  Diabetic Foot Exam - Simple   Simple Foot Form Visual Inspection See comments:  Yes Sensation Testing See comments:  Yes Pulse Check See comments:  Yes Comments Absent monofilament sensation in the feet bilaterally He has swelling, redness on the right first toe with some extension to the distal foot       ASSESSMENT/ PLAN:   Diabetes type 2 with obesity, insulin-dependent  See history of present illness for detailed discussion of current diabetes management, blood sugar patterns and problems identified  Currently on OMNIPOD insulin pump with U-500 insulin  His A1c is considerably better at 6.9, previously at least 10% persistently  He has checked his blood sugars on his Accu-Chek meter which could not be downloaded for the  data because of being correct year programmed He is only entering his readings when he is bolusing Although most of his blood sugars are fairly good he has no consistent pattern of high readings Most of his high readings either from missing boluses, occasionally running out of his insulin pump or  underestimating his bolus need for larger meals for more carbohydrate No hypoglycemia overnight Overall still needing a little less basal insulin compared to when he first started on the pump  Today discussed adjusting boluses based on what he is eating, to make sure he is not running out of his insulin pump supplies, taking correction boluses more consistently when blood sugars are high even when not eating He will be prescribed a freestyle test strips that will be compatible with his pump Also will recommend CGM since he will benefit from this because of his labile blood sugars and better evaluation of overnight patterns  No change in settings made  DIABETIC foot ulcer: He has severe neuropathy and persistently nonhealing ulcer with cellulitis We will start him on Augmentin since he has tried cephalexin and with his renal failure is not a candidate for doxycycline or Septra Augmentin will be 500 mg twice daily adjusted for his renal function  Will need urgent referral to orthopedic surgeon for wound care  ORTHOSTATIC hypotension: He has had orthostatic changes not benefited by midodrine and poorly controlled otherwise However his symptoms are better He is not having as much orthostatic drop in blood pressure and this may be related to improvement in autonomic function with better diabetes control  Most likely he may benefit from droxidopa but not clear if this is indicated at this time unless he becomes symptomatic again  CKD: Needs follow-up creatinine level and no previous records are available  Hypothyroidism: Will need repeat thyroid level today  Total visit time for evaluation and management of multiple problems and counseling = 40 minutes  There are no Patient Instructions on file for this visit.        Elayne Snare 06/16/2018, 10:29 AM   Note: This office note was prepared with Dragon voice recognition system technology. Any transcriptional errors that result from this  process are unintentional.

## 2018-06-15 NOTE — Telephone Encounter (Signed)
Please verify what type of strips needed to order for the pt

## 2018-06-18 ENCOUNTER — Other Ambulatory Visit: Payer: Self-pay | Admitting: Endocrinology

## 2018-06-18 DIAGNOSIS — E1165 Type 2 diabetes mellitus with hyperglycemia: Secondary | ICD-10-CM

## 2018-06-18 DIAGNOSIS — Z794 Long term (current) use of insulin: Secondary | ICD-10-CM

## 2018-06-18 MED ORDER — GLUCOSE BLOOD VI STRP: ORAL_STRIP | 12 refills | Status: DC

## 2018-06-18 NOTE — Telephone Encounter (Signed)
Rx freestyle lite test strip--with instructions by Dr. Milagros Reap to [facility]

## 2018-06-19 ENCOUNTER — Ambulatory Visit (INDEPENDENT_AMBULATORY_CARE_PROVIDER_SITE_OTHER): Payer: Medicaid Other

## 2018-06-19 ENCOUNTER — Ambulatory Visit (INDEPENDENT_AMBULATORY_CARE_PROVIDER_SITE_OTHER): Payer: Self-pay | Admitting: Physician Assistant

## 2018-06-19 ENCOUNTER — Telehealth: Payer: Self-pay | Admitting: Endocrinology

## 2018-06-19 ENCOUNTER — Ambulatory Visit (INDEPENDENT_AMBULATORY_CARE_PROVIDER_SITE_OTHER): Payer: Medicaid Other | Admitting: Orthopedic Surgery

## 2018-06-19 ENCOUNTER — Encounter (INDEPENDENT_AMBULATORY_CARE_PROVIDER_SITE_OTHER): Payer: Self-pay | Admitting: Orthopedic Surgery

## 2018-06-19 ENCOUNTER — Encounter (HOSPITAL_COMMUNITY): Payer: Self-pay | Admitting: *Deleted

## 2018-06-19 ENCOUNTER — Other Ambulatory Visit: Payer: Self-pay

## 2018-06-19 VITALS — Ht 69.0 in | Wt 307.0 lb

## 2018-06-19 DIAGNOSIS — M869 Osteomyelitis, unspecified: Secondary | ICD-10-CM

## 2018-06-19 DIAGNOSIS — M79674 Pain in right toe(s): Secondary | ICD-10-CM

## 2018-06-19 DIAGNOSIS — Z6841 Body Mass Index (BMI) 40.0 and over, adult: Secondary | ICD-10-CM | POA: Diagnosis not present

## 2018-06-19 DIAGNOSIS — E1142 Type 2 diabetes mellitus with diabetic polyneuropathy: Secondary | ICD-10-CM

## 2018-06-19 NOTE — Telephone Encounter (Signed)
Patient came in and stated he wanted to let Dr. Dwyane Dee know the foot doctor he went to see will be amputating his big toe.

## 2018-06-19 NOTE — Telephone Encounter (Signed)
FYI

## 2018-06-19 NOTE — Telephone Encounter (Signed)
Called [facility] to have CMN faxed to this office again and be filled out.

## 2018-06-19 NOTE — Telephone Encounter (Signed)
[facility] ph# [phone] re: status of forms -Letter of Medical Necessity for CGM and Physician Order for Diabetic Shoes-Medicaid Certificate of Medical Necessity for Diabetic Shoes (forms were faxed to [place] on 06/15/18). Please call the above ph# to give status of the above forms.

## 2018-06-19 NOTE — Telephone Encounter (Signed)
Surgeon's progress notes have been noted, patient has osteomyelitis on x-ray

## 2018-06-19 NOTE — Progress Notes (Signed)
Office Visit Note   Patient: Ethan Campbell           Date of Birth: 1961/03/21           MRN: [id number] Visit Date: 06/19/2018              Requested by: Elayne Snare, MD [address] [place], [place] [address] PCP: Patient, No Pcp Per  Chief Complaint  Patient presents with  . Right Foot - Pain      HPI: Patient is a 57 year old gentleman with a long history of uncontrolled type 2 diabetes.  Patient states that his hemoglobin A1c has been as high as 15 he states is currently around 10.  Patient states he is undergone several years of debridement for the ulcer to the great toe he states that he has had cellulitis swelling ulceration and draining over the past 6 months.  Patient is undergone good conservative therapy with oral antibiotics postoperative shoe.  Patient denies a history of tobacco use.  Assessment & Plan: Visit Diagnoses:  1. Great toe pain, right   2. Body mass index 45.0-49.9, adult ([place])   3. Morbid obesity ([place])   4. Diabetic polyneuropathy associated with type 2 diabetes mellitus ([place])   5. Osteomyelitis of great toe of right foot ([place])     Plan: Discussed with the patient that at a minimum we have been amputate the great toe through the MTP joint.  Will determine if there is enough soft tissue for wound healing.  May need resection of the metatarsal head however with patient size of the like to preserve his metatarsal head.  Risks and benefits were discussed the importance of strict nonweightbearing postoperatively was discussed.  Risk of the wound not healing risk for higher level amputation.  Patient states he understands and wishes to proceed at this time.  Will hold on his Coumadin tonight we will recheck his INR prior to surgery.  Follow-Up Instructions: No follow-ups on file.   Ortho Exam  Patient is alert, oriented, no adenopathy, well-dressed, normal affect, normal respiratory effort. Examination patient has a good dorsalis pedis pulse  he does have sausage digit swelling and cellulitis of the great toe.  Patient has ulceration which extends from the plantar aspect of the great toe and extends to the dorsal aspect of the great toe with through and through ulceration.  There is destruction of the proximal phalanx and tuft of the great toe.  Patient has no ascending cellulitis.  Patient does have a pacemaker he is on Coumadin 10 mg a day and states that he has not had his INR level checked recently.  Imaging: No results found. No images are attached to the encounter.  Labs: Lab Results  Component Value Date   HGBA1C 6.9 (A) 06/15/2018   HGBA1C 10.9 (A) 02/21/2018   HGBA1C 13.4 09/11/2017     Lab Results  Component Value Date   ALBUMIN 3.9 09/30/2015   ALBUMIN 4.1 09/03/2015   ALBUMIN 4.0 04/02/2014    Body mass index is 45.34 kg/m.  Orders:  Orders Placed This Encounter  Procedures  . XR Toe Great Right   No orders of the defined types were placed in this encounter.    Procedures: No procedures performed  Clinical Data: No additional findings.  ROS:  All other systems negative, except as noted in the HPI. Review of Systems  Objective: Vital Signs: Ht 5\' 9"  (1.753 m)   Wt (!) 307 lb (139.3 kg)  BMI 45.34 kg/m   Specialty Comments:  No specialty comments available.  PMFS History: Patient Active Problem List   Diagnosis Date Noted  . Osteomyelitis of great toe of right foot ([place]) 06/19/2018  . Diabetic polyneuropathy associated with type 2 diabetes mellitus ([place]) 06/19/2018  . Body mass index 45.0-49.9, adult ([place]) 06/19/2018  . Seizure disorder ([place]) 08/27/2015  . Somnolence, daytime 08/27/2015  . Orthostatic hypotension 11/27/2014  . HLD (hyperlipidemia) 11/27/2014  . Syncope and collapse 08/20/2014  . Diabetic autonomic neuropathy associated with type 2 diabetes mellitus ([place]) 05/01/2014  . Dizzy spells 03/17/2014  . Chronic kidney disease, stage III (moderate) ([place]) 08/22/2013  .  Type II diabetes mellitus, uncontrolled ([place]) 12/03/2009  . HYPERLIPIDEMIA 12/03/2009  . Morbid obesity ([place]) 12/03/2009  . OBSTRUCTIVE SLEEP APNEA 12/03/2009  . ESSENTIAL HYPERTENSION, BENIGN 12/03/2009  . EDEMA 12/03/2009  . PRECORDIAL PAIN 12/03/2009  . PROTEINURIA 12/03/2009   Past Medical History:  Diagnosis Date  . Essential hypertension   . History of stroke   . Hyperlipidemia   . Insomnia   . Obesity   . Precordial pain June 2011   Nuclear stress; no ischemia; EF 60%  . Proteinuria   . Sleep apnea    Dr. Brandon Melnick  . Type 2 diabetes mellitus ([place])   . Venous insufficiency     Family History  Problem Relation Age of Onset  . Heart attack Mother 65  . Breast cancer Mother   . Stroke Mother   . Heart attack Father 26  . Breast cancer Sister   . Colon cancer Sister     Past Surgical History:  Procedure Laterality Date  . RESECTION BONE TUMOR FEMUR  1980's   Left femur, treated at [facility] with bone graft  . TOTAL KNEE ARTHROPLASTY Left 2013   Social History   Occupational History  . Occupation: Disabled  Tobacco Use  . Smoking status: Former Smoker    Types: Cigarettes  . Smokeless tobacco: Never Used  . Tobacco comment: 11/27/14 "quit smoking years ago"  Substance and Sexual Activity  . Alcohol use: No    Alcohol/week: 0.0 standard drinks  . Drug use: No  . Sexual activity: Never

## 2018-06-19 NOTE — Progress Notes (Signed)
Spoke with pt for pre-op call. Pt has hx of sick sinus syndrome and has a pacemaker. Pt denies chest pain or sob. Pt's cardiologist is in [place]. Pt is a type 2 diabetic. Pt's last A1C was 6.9 on 06/15/18. Pt states his fasting blood sugar is usually between 112-115. Pt has an Omnipod pump and uses Humulin R concentrated U500 insulin. I spoke with Dr. Ermalene Postin, Anesthesiologist as to whether pt needed to decrease his basal rate at midnight like we tell patients with pumps that use regular insulin. Dr. Ermalene Postin states for pt not to change his basal rate. I gave this information to pt (via his wife).  Instructed pt to check his blood sugar when he gets up in the AM and every 2 hours until he leaves for the hospital. If blood sugar is 70 or below, treat with 1/2 cup of clear juice (apple or cranberry) and recheck blood sugar 15 minutes after drinking juice. If blood sugar continues to be 70 or below, call the Short Stay department and ask to speak to a nurse. Pt voiced understanding.  Have faxed the Periop Rx for ICD/Pacer to the [facility] in [place]. Have requested most recent EKG and stress test results from [facility]

## 2018-06-20 ENCOUNTER — Ambulatory Visit (HOSPITAL_COMMUNITY): Payer: Medicaid Other | Admitting: Anesthesiology

## 2018-06-20 ENCOUNTER — Encounter (HOSPITAL_COMMUNITY): Payer: Self-pay

## 2018-06-20 ENCOUNTER — Ambulatory Visit (HOSPITAL_COMMUNITY)
Admission: RE | Admit: 2018-06-20 | Discharge: 2018-06-20 | Disposition: A | Discharge status: Home / Self Care | Payer: Medicaid Other | Source: Ambulatory Visit | Attending: Orthopedic Surgery | Admitting: Orthopedic Surgery

## 2018-06-20 ENCOUNTER — Encounter (HOSPITAL_COMMUNITY)
Admission: RE | Disposition: A | Discharge status: Home / Self Care | Payer: Self-pay | Source: Ambulatory Visit | Attending: Orthopedic Surgery

## 2018-06-20 DIAGNOSIS — G4730 Sleep apnea, unspecified: Secondary | ICD-10-CM | POA: Diagnosis not present

## 2018-06-20 DIAGNOSIS — G4700 Insomnia, unspecified: Secondary | ICD-10-CM | POA: Diagnosis not present

## 2018-06-20 DIAGNOSIS — F329 Major depressive disorder, single episode, unspecified: Secondary | ICD-10-CM | POA: Diagnosis not present

## 2018-06-20 DIAGNOSIS — I872 Venous insufficiency (chronic) (peripheral): Secondary | ICD-10-CM | POA: Diagnosis not present

## 2018-06-20 DIAGNOSIS — Z7901 Long term (current) use of anticoagulants: Secondary | ICD-10-CM | POA: Diagnosis not present

## 2018-06-20 DIAGNOSIS — Z950 Presence of cardiac pacemaker: Secondary | ICD-10-CM | POA: Insufficient documentation

## 2018-06-20 DIAGNOSIS — M1990 Unspecified osteoarthritis, unspecified site: Secondary | ICD-10-CM | POA: Insufficient documentation

## 2018-06-20 DIAGNOSIS — R55 Syncope and collapse: Secondary | ICD-10-CM | POA: Diagnosis not present

## 2018-06-20 DIAGNOSIS — E785 Hyperlipidemia, unspecified: Secondary | ICD-10-CM | POA: Insufficient documentation

## 2018-06-20 DIAGNOSIS — I129 Hypertensive chronic kidney disease with stage 1 through stage 4 chronic kidney disease, or unspecified chronic kidney disease: Secondary | ICD-10-CM | POA: Diagnosis not present

## 2018-06-20 DIAGNOSIS — E039 Hypothyroidism, unspecified: Secondary | ICD-10-CM | POA: Diagnosis not present

## 2018-06-20 DIAGNOSIS — Z87891 Personal history of nicotine dependence: Secondary | ICD-10-CM | POA: Diagnosis not present

## 2018-06-20 DIAGNOSIS — L97519 Non-pressure chronic ulcer of other part of right foot with unspecified severity: Secondary | ICD-10-CM | POA: Diagnosis not present

## 2018-06-20 DIAGNOSIS — E1142 Type 2 diabetes mellitus with diabetic polyneuropathy: Secondary | ICD-10-CM | POA: Diagnosis not present

## 2018-06-20 DIAGNOSIS — G40909 Epilepsy, unspecified, not intractable, without status epilepticus: Secondary | ICD-10-CM | POA: Insufficient documentation

## 2018-06-20 DIAGNOSIS — M869 Osteomyelitis, unspecified: Secondary | ICD-10-CM | POA: Diagnosis not present

## 2018-06-20 DIAGNOSIS — I495 Sick sinus syndrome: Secondary | ICD-10-CM | POA: Diagnosis not present

## 2018-06-20 DIAGNOSIS — E11621 Type 2 diabetes mellitus with foot ulcer: Secondary | ICD-10-CM | POA: Insufficient documentation

## 2018-06-20 DIAGNOSIS — Z85830 Personal history of malignant neoplasm of bone: Secondary | ICD-10-CM | POA: Insufficient documentation

## 2018-06-20 DIAGNOSIS — E1122 Type 2 diabetes mellitus with diabetic chronic kidney disease: Secondary | ICD-10-CM | POA: Insufficient documentation

## 2018-06-20 DIAGNOSIS — N183 Chronic kidney disease, stage 3 (moderate): Secondary | ICD-10-CM | POA: Diagnosis not present

## 2018-06-20 DIAGNOSIS — L03031 Cellulitis of right toe: Secondary | ICD-10-CM | POA: Diagnosis present

## 2018-06-20 DIAGNOSIS — Z8673 Personal history of transient ischemic attack (TIA), and cerebral infarction without residual deficits: Secondary | ICD-10-CM | POA: Insufficient documentation

## 2018-06-20 DIAGNOSIS — Z6841 Body Mass Index (BMI) 40.0 and over, adult: Secondary | ICD-10-CM | POA: Insufficient documentation

## 2018-06-20 HISTORY — DX: Epilepsy, unspecified, not intractable, without status epilepticus: G40.909

## 2018-06-20 HISTORY — DX: Syncope and collapse: R55

## 2018-06-20 HISTORY — DX: Acute embolism and thrombosis of unspecified veins of left upper extremity: I82.602

## 2018-06-20 HISTORY — DX: Major depressive disorder, single episode, unspecified: F32.9

## 2018-06-20 HISTORY — PX: AMPUTATION: SHX166

## 2018-06-20 HISTORY — DX: Unspecified osteoarthritis, unspecified site: M19.90

## 2018-06-20 HISTORY — DX: Chronic kidney disease, unspecified: N18.9

## 2018-06-20 HISTORY — DX: Depression, unspecified: F32.A

## 2018-06-20 HISTORY — DX: Hypothyroidism, unspecified: E03.9

## 2018-06-20 HISTORY — DX: Malignant (primary) neoplasm, unspecified: C80.1

## 2018-06-20 HISTORY — DX: Presence of cardiac pacemaker: Z95.0

## 2018-06-20 HISTORY — DX: Sick sinus syndrome: I49.5

## 2018-06-20 LAB — GLUCOSE, CAPILLARY
GLUCOSE-CAPILLARY: 83 mg/dL (ref 70–99)
GLUCOSE-CAPILLARY: 62 mg/dL — AB (ref 70–99)
GLUCOSE-CAPILLARY: 77 mg/dL (ref 70–99)
Glucose-Capillary: 64 mg/dL — ABNORMAL LOW (ref 70–99)

## 2018-06-20 LAB — PROTIME-INR
INR: 2.36
Prothrombin Time: 25.5 seconds — ABNORMAL HIGH (ref 11.4–15.2)

## 2018-06-20 LAB — CBC
HCT: 38.7 % — ABNORMAL LOW (ref 39.0–52.0)
Hemoglobin: 11.7 g/dL — ABNORMAL LOW (ref 13.0–17.0)
MCH: 27 pg (ref 26.0–34.0)
MCHC: 30.2 g/dL (ref 30.0–36.0)
MCV: 89.4 fL (ref 80.0–100.0)
Platelets: 423 10*3/uL — ABNORMAL HIGH (ref 150–400)
RBC: 4.33 MIL/uL (ref 4.22–5.81)
RDW: 14 % (ref 11.5–15.5)
WBC: 6.3 10*3/uL (ref 4.0–10.5)
nRBC: 0 % (ref 0.0–0.2)

## 2018-06-20 SURGERY — AMPUTATION DIGIT
Anesthesia: General | Site: Toe | Laterality: Right

## 2018-06-20 MED ORDER — CEFAZOLIN SODIUM 1 G IJ SOLR
INTRAMUSCULAR | Status: AC
  Filled 2018-06-20: qty 10

## 2018-06-20 MED ORDER — ONDANSETRON HCL 4 MG/2ML IJ SOLN
INTRAMUSCULAR | Status: AC
  Filled 2018-06-20: qty 2

## 2018-06-20 MED ORDER — SODIUM CHLORIDE (PF) 0.9 % IJ SOLN
INTRAMUSCULAR | Status: AC
  Filled 2018-06-20: qty 10

## 2018-06-20 MED ORDER — EPHEDRINE 5 MG/ML INJ
INTRAVENOUS | Status: AC
  Filled 2018-06-20: qty 10

## 2018-06-20 MED ORDER — ACETAMINOPHEN 160 MG/5ML PO SOLN: 325.0000 mg | ORAL | Status: DC | PRN

## 2018-06-20 MED ORDER — SODIUM CHLORIDE 0.9 % IV SOLN
INTRAVENOUS | Status: DC | PRN
  Administered 2018-06-20: 30 ug/min via INTRAVENOUS

## 2018-06-20 MED ORDER — PROPOFOL 10 MG/ML IV BOLUS
INTRAVENOUS | Status: AC
  Filled 2018-06-20: qty 20

## 2018-06-20 MED ORDER — ACETAMINOPHEN 325 MG PO TABS: 325.0000 mg | ORAL_TABLET | ORAL | Status: DC | PRN

## 2018-06-20 MED ORDER — CEFAZOLIN SODIUM-DEXTROSE 2-4 GM/100ML-% IV SOLN
INTRAVENOUS | Status: AC
  Filled 2018-06-20: qty 100

## 2018-06-20 MED ORDER — HYDROCODONE-ACETAMINOPHEN 5-325 MG PO TABS: 1.0000 | ORAL_TABLET | ORAL | 0 refills | Status: DC | PRN

## 2018-06-20 MED ORDER — SUCCINYLCHOLINE CHLORIDE 200 MG/10ML IV SOSY
PREFILLED_SYRINGE | INTRAVENOUS | Status: AC
  Filled 2018-06-20: qty 10

## 2018-06-20 MED ORDER — MEPERIDINE HCL 50 MG/ML IJ SOLN: 6.2500 mg | INTRAMUSCULAR | Status: DC | PRN

## 2018-06-20 MED ORDER — CHLORHEXIDINE GLUCONATE 4 % EX LIQD: 60.0000 mL | Freq: Once | CUTANEOUS | Status: DC

## 2018-06-20 MED ORDER — EPHEDRINE SULFATE 50 MG/ML IJ SOLN
INTRAMUSCULAR | Status: DC | PRN
  Administered 2018-06-20 (×2): 5 mg via INTRAVENOUS
  Administered 2018-06-20: 10 mg via INTRAVENOUS

## 2018-06-20 MED ORDER — FENTANYL CITRATE (PF) 250 MCG/5ML IJ SOLN
INTRAMUSCULAR | Status: AC
  Filled 2018-06-20: qty 5

## 2018-06-20 MED ORDER — CEFAZOLIN SODIUM-DEXTROSE 2-4 GM/100ML-% IV SOLN: 2.0000 g | INTRAVENOUS | Status: DC

## 2018-06-20 MED ORDER — CEFAZOLIN SODIUM-DEXTROSE 1-4 GM/50ML-% IV SOLN
INTRAVENOUS | Status: DC | PRN
  Administered 2018-06-20: 3 g via INTRAVENOUS

## 2018-06-20 MED ORDER — LACTATED RINGERS IV SOLN
INTRAVENOUS | Status: DC | PRN
  Administered 2018-06-20: 09:00:00 via INTRAVENOUS

## 2018-06-20 MED ORDER — GLYCOPYRROLATE PF 0.2 MG/ML IJ SOSY
PREFILLED_SYRINGE | INTRAMUSCULAR | Status: AC
  Filled 2018-06-20: qty 1

## 2018-06-20 MED ORDER — PHENYLEPHRINE 40 MCG/ML (10ML) SYRINGE FOR IV PUSH (FOR BLOOD PRESSURE SUPPORT)
PREFILLED_SYRINGE | INTRAVENOUS | Status: AC
  Filled 2018-06-20: qty 10

## 2018-06-20 MED ORDER — OXYCODONE HCL 5 MG/5ML PO SOLN: 5.0000 mg | Freq: Once | ORAL | Status: DC | PRN

## 2018-06-20 MED ORDER — ARTIFICIAL TEARS OPHTHALMIC OINT
TOPICAL_OINTMENT | OPHTHALMIC | Status: AC
  Filled 2018-06-20: qty 3.5

## 2018-06-20 MED ORDER — LIDOCAINE 2% (20 MG/ML) 5 ML SYRINGE
INTRAMUSCULAR | Status: AC
  Filled 2018-06-20: qty 5

## 2018-06-20 MED ORDER — ONDANSETRON HCL 4 MG/2ML IJ SOLN: 4.0000 mg | Freq: Once | INTRAMUSCULAR | Status: DC | PRN

## 2018-06-20 MED ORDER — MIDAZOLAM HCL 2 MG/2ML IJ SOLN
INTRAMUSCULAR | Status: AC
  Filled 2018-06-20: qty 2

## 2018-06-20 MED ORDER — ONDANSETRON HCL 4 MG/2ML IJ SOLN
INTRAMUSCULAR | Status: DC | PRN
  Administered 2018-06-20: 4 mg via INTRAVENOUS

## 2018-06-20 MED ORDER — 0.9 % SODIUM CHLORIDE (POUR BTL) OPTIME
TOPICAL | Status: DC | PRN
  Administered 2018-06-20: 1000 mL

## 2018-06-20 MED ORDER — OXYCODONE HCL 5 MG PO TABS: 5.0000 mg | ORAL_TABLET | Freq: Once | ORAL | Status: DC | PRN

## 2018-06-20 MED ORDER — FENTANYL CITRATE (PF) 100 MCG/2ML IJ SOLN: 25.0000 ug | INTRAMUSCULAR | Status: DC | PRN

## 2018-06-20 MED ORDER — PROPOFOL 10 MG/ML IV BOLUS
INTRAVENOUS | Status: DC | PRN
  Administered 2018-06-20: 200 mg via INTRAVENOUS

## 2018-06-20 MED ORDER — SODIUM CHLORIDE 0.9 % IV SOLN
INTRAVENOUS | Status: DC
  Administered 2018-06-20: 10:00:00 via INTRAVENOUS

## 2018-06-20 MED ORDER — GLYCOPYRROLATE 0.2 MG/ML IJ SOLN
INTRAMUSCULAR | Status: DC | PRN
  Administered 2018-06-20: 0.1 mg via INTRAVENOUS

## 2018-06-20 MED ORDER — MIDAZOLAM HCL 5 MG/5ML IJ SOLN
INTRAMUSCULAR | Status: DC | PRN
  Administered 2018-06-20: 2 mg via INTRAVENOUS

## 2018-06-20 MED ORDER — LIDOCAINE 2% (20 MG/ML) 5 ML SYRINGE
INTRAMUSCULAR | Status: DC | PRN
  Administered 2018-06-20: 100 mg via INTRAVENOUS

## 2018-06-20 SURGICAL SUPPLY — 30 items
BLADE HEX COATED 2.75 (ELECTRODE) ×1 IMPLANT
BLADE SURG 21 STRL SS (BLADE) ×2 IMPLANT
BNDG CMPR 9X4 STRL LF SNTH (GAUZE/BANDAGES/DRESSINGS)
BNDG COHESIVE 4X5 TAN STRL (GAUZE/BANDAGES/DRESSINGS) ×2 IMPLANT
BNDG ESMARK 4X9 LF (GAUZE/BANDAGES/DRESSINGS) IMPLANT
BNDG GAUZE ELAST 4 BULKY (GAUZE/BANDAGES/DRESSINGS) ×2 IMPLANT
COVER SURGICAL LIGHT HANDLE (MISCELLANEOUS) ×4 IMPLANT
COVER WAND RF STERILE (DRAPES) ×2 IMPLANT
DRAPE U-SHAPE 47X51 STRL (DRAPES) ×2 IMPLANT
DRSG ADAPTIC 3X8 NADH LF (GAUZE/BANDAGES/DRESSINGS) ×1 IMPLANT
DRSG PAD ABDOMINAL 8X10 ST (GAUZE/BANDAGES/DRESSINGS) ×2 IMPLANT
DURAPREP 26ML APPLICATOR (WOUND CARE) ×2 IMPLANT
ELECT REM PT RETURN 9FT ADLT (ELECTROSURGICAL) ×2
ELECTRODE REM PT RTRN 9FT ADLT (ELECTROSURGICAL) ×1 IMPLANT
GAUZE SPONGE 4X4 12PLY STRL (GAUZE/BANDAGES/DRESSINGS) ×1 IMPLANT
GLOVE BIOGEL PI IND STRL 9 (GLOVE) ×1 IMPLANT
GLOVE BIOGEL PI INDICATOR 9 (GLOVE) ×1
GLOVE SURG ORTHO 9.0 STRL STRW (GLOVE) ×2 IMPLANT
GOWN STRL REUS W/ TWL XL LVL3 (GOWN DISPOSABLE) ×2 IMPLANT
GOWN STRL REUS W/TWL XL LVL3 (GOWN DISPOSABLE) ×4
KIT BASIN OR (CUSTOM PROCEDURE TRAY) ×2 IMPLANT
KIT TURNOVER KIT B (KITS) ×2 IMPLANT
MANIFOLD NEPTUNE II (INSTRUMENTS) ×1 IMPLANT
NEEDLE 22X1 1/2 (OR ONLY) (NEEDLE) IMPLANT
NS IRRIG 1000ML POUR BTL (IV SOLUTION) ×2 IMPLANT
PACK ORTHO EXTREMITY (CUSTOM PROCEDURE TRAY) ×2 IMPLANT
PAD ARMBOARD 7.5X6 YLW CONV (MISCELLANEOUS) ×4 IMPLANT
SUT ETHILON 2 0 PSLX (SUTURE) ×2 IMPLANT
SYR CONTROL 10ML LL (SYRINGE) IMPLANT
TOWEL OR 17X26 10 PK STRL BLUE (TOWEL DISPOSABLE) ×2 IMPLANT

## 2018-06-20 NOTE — Progress Notes (Signed)
Notified by lab that PT/INR was hemolyzed. Order re-entered STAT and re-drawn by lab. Dr. Luane School made aware.    Jacqlyn Larsen, RN

## 2018-06-20 NOTE — Anesthesia Procedure Notes (Addendum)
Procedure Name: LMA Insertion Date/Time: 06/20/2018 12:00 PM Performed by: Cleda Daub, CRNA Pre-anesthesia Checklist: Patient identified, Emergency Drugs available, Suction available and Patient being monitored Patient Re-evaluated:Patient Re-evaluated prior to induction Oxygen Delivery Method: Circle system utilized Preoxygenation: Pre-oxygenation with 100% oxygen Induction Type: IV induction LMA: LMA inserted LMA Size: 5.0 Number of attempts: 1 Placement Confirmation: positive ETCO2 Tube secured with: Tape Dental Injury: Teeth and Oropharynx as per pre-operative assessment

## 2018-06-20 NOTE — H&P (Signed)
Ethan Campbell is an 57 y.o. male.   Chief Complaint: Chronic ulceration drainage swelling and cellulitis right great toe HPI: Patient is a 57 year old gentleman with a long history of uncontrolled type 2 diabetes.  Patient states that his hemoglobin A1c has been as high as 15 he states is currently around 10.  Patient states he is undergone several years of debridement for the ulcer to the great toe he states that he has had cellulitis swelling ulceration and draining over the past 6 months.  Patient is undergone good conservative therapy with oral antibiotics postoperative shoe.  Patient denies a history of tobacco use.   Past Medical History:  Diagnosis Date  . Arm vein blood clot, left    on Coumadin  . Arthritis   . Cancer ([place])    Bone cancer 1987 (in knee Large cell tumor)  . Chronic kidney disease    stage 3  . Depression   . Essential hypertension   . History of stroke   . Hyperlipidemia   . Hypothyroidism   . Insomnia   . Obesity   . Precordial pain June 2011   Nuclear stress; no ischemia; EF 60%  . Presence of permanent cardiac pacemaker   . Proteinuria   . Seizure disorder ([place])   . Sick sinus syndrome ([place])   . Sleep apnea    Dr. Brandon Melnick, uses bipap  . Syncope   . Type 2 diabetes mellitus ([place])   . Venous insufficiency     Past Surgical History:  Procedure Laterality Date  . COLONOSCOPY    . RESECTION BONE TUMOR FEMUR  1980's   Left femur, treated at [facility] with bone graft  . TOTAL KNEE ARTHROPLASTY Left 2013    Family History  Problem Relation Age of Onset  . Heart attack Mother 36  . Breast cancer Mother   . Stroke Mother   . Heart attack Father 88  . Breast cancer Sister   . Colon cancer Sister    Social History:  reports that he has quit smoking. His smoking use included cigarettes. He has never used smokeless tobacco. He reports that he does not drink alcohol or use drugs.  Allergies: No Known Allergies  No medications prior to admission.     No results found for this or any previous visit (from the past 48 hour(s)). Xr Toe Great Right  Result Date: 06/19/2018 3 view radiographs of the right foot shows destructive chronic lytic changes of the tuft of the great toe as well as chronic destructive changes of the proximal phalanx of the great toe consistent with chronic osteomyelitis there is soft tissue swelling of the great toe there is calcification of the arteries with peripheral vascular disease.   Review of Systems  All other systems reviewed and are negative.   There were no vitals taken for this visit. Physical Exam  Plan: Discussed with the patient that at a minimum we have been amputate the great toe through the MTP joint.  Will determine if there is enough soft tissue for wound healing.  May need resection of the metatarsal head however with patient size of the like to preserve his metatarsal head.  Risks and benefits were discussed the importance of strict nonweightbearing postoperatively was discussed.  Risk of the wound not healing risk for higher level amputation.  Patient states he understands and wishes to proceed at this time.  Will hold on his Coumadin tonight we will recheck his INR prior to surgery.  Assessment/Plan  1. Great toe pain, right   2. Body mass index 45.0-49.9, adult ([place])   3. Morbid obesity ([place])   4. Diabetic polyneuropathy associated with type 2 diabetes mellitus ([place])   5. Osteomyelitis of great toe of right foot ([place])     Plan: Discussed with the patient that at a minimum we have been amputate the great toe through the MTP joint.  Will determine if there is enough soft tissue for wound healing.  May need resection of the metatarsal head however with patient size of the like to preserve his metatarsal head.  Risks and benefits were discussed the importance of strict nonweightbearing postoperatively was discussed.  Risk of the wound not healing risk for higher level amputation.  Patient  states he understands and wishes to proceed at this time.  Will hold on his Coumadin tonight we will recheck his INR prior to surgery.    Newt Minion, MD 06/20/2018, 6:40 AM

## 2018-06-20 NOTE — Progress Notes (Signed)
Orthopedic Tech Progress Note Patient Details:  Ethan Campbell August 15, 1960 [id number]  Ortho Devices Type of Ortho Device: Crutches Ortho Device/Splint Interventions: Ordered, Application, Adjustment   Post Interventions Patient Tolerated: Well Instructions Provided: Care of device, Adjustment of device   Karolee Stamps 06/20/2018, 3:08 PM

## 2018-06-20 NOTE — Progress Notes (Signed)
Hypoglycemic Event  CBG: 64  Treatment: 15 GM carbohydrate snack  Symptoms: None  Follow-up CBG: Time:1205 CBG Result:62  Possible Reasons for Event: Inadequate meal intake  Comments/MD notified:Dr Gordy Councilman

## 2018-06-20 NOTE — Anesthesia Postprocedure Evaluation (Signed)
Anesthesia Post Note  Patient: Ethan Campbell  Procedure(s) Performed: RIGHT GREAT TOE AMPUTATION (Right Toe)     Anesthesia Post Evaluation  Last Vitals:  Vitals:   06/20/18 0903 06/20/18 1230  BP: (!) 170/55   Pulse: 83   Resp: 20   Temp: 36.6 C (!) 36.4 C  SpO2: 99%     Last Pain:  Vitals:   06/20/18 1230  PainSc: Asleep                 Arieon Corcoran

## 2018-06-20 NOTE — Op Note (Signed)
06/20/2018  12:17 PM  PATIENT:  [name]    PRE-OPERATIVE DIAGNOSIS:  Chronic Osteomyelitis Right Great Toe  POST-OPERATIVE DIAGNOSIS:  Same  PROCEDURE:  RIGHT GREAT TOE AMPUTATION  SURGEON:  Newt Minion, MD  PHYSICIAN ASSISTANT:None ANESTHESIA:   General  PREOPERATIVE INDICATIONS:  Ethan Campbell is a  57 y.o. male with a diagnosis of Chronic Osteomyelitis Right Great Toe who failed conservative measures and elected for surgical management.    The risks benefits and alternatives were discussed with the patient preoperatively including but not limited to the risks of infection, bleeding, nerve injury, cardiopulmonary complications, the need for revision surgery, among others, and the patient was willing to proceed.  OPERATIVE IMPLANTS: None  @ENCIMAGES @  OPERATIVE FINDINGS: Complete destruction of the proximal phalanx from chronic osteomyelitis no abscess at the MTP joint  OPERATIVE PROCEDURE: Patient was brought the operating room and underwent a general anesthetic.  After adequate levels anesthesia were obtained patient's right lower extremity was prepped using DuraPrep draped into a sterile field a timeout was called.  A racquet incision was made just distal to the MTP joint.  With retraction the proximal phalanx completely broken to secondary to the chronic osteomyelitis.  Patient had infection extended down to the sesamoids and the sesamoids were resected.  The wound was irrigated with normal saline electrocautery was used for hemostasis the toe was amputated through the MTP joint.  The metatarsal head was uninvolved with infection there was no infection at the MTP joint.  After irrigation the incision was closed using 2-0 nylon.  A sterile compressive dressing was applied patient was extubated taken the PACU in stable condition.   DISCHARGE PLANNING:  Antibiotic duration: Continue oral antibiotics  Weightbearing: Touchdown weightbearing on the right  Pain  medication: Prescription for Vicodin  Dressing care/ Wound VAC: Dry dressing change at follow-up  Ambulatory devices: Walker or crutches  Discharge to: Home.  Follow-up: In the office 1 week post operative.

## 2018-06-20 NOTE — Progress Notes (Signed)
Hypoglycemic Event  CBG: 62  Treatment: 15 GM carbohydrate snack  Symptoms: None  Follow-up CBG: Time:1327      CBG Result:77  Possible Reasons for Event: Inadequate meal intake      Signe Colt

## 2018-06-20 NOTE — Anesthesia Preprocedure Evaluation (Addendum)
Anesthesia Evaluation  Patient identified by MRN, date of birth, ID band Patient awake    Reviewed: Allergy & Precautions, H&P , NPO status , Patient's Chart, lab work & pertinent test results, reviewed documented beta blocker date and time   Airway Mallampati: I  TM Distance: >3 FB Neck ROM: full    Dental no notable dental hx. (+) Teeth Intact, Poor Dentition,    Pulmonary sleep apnea and Continuous Positive Airway Pressure Ventilation , former smoker,    Pulmonary exam normal breath sounds clear to auscultation       Cardiovascular Exercise Tolerance: Good hypertension, Pt. on medications + dysrhythmias + pacemaker  Rhythm:regular Rate:Normal     Neuro/Psych Seizures -,  PSYCHIATRIC DISORDERS Depression TIA Neuromuscular disease CVA    GI/Hepatic negative GI ROS, Neg liver ROS,   Endo/Other  diabetes, Type 2, Insulin DependentHypothyroidism   Renal/GU negative Renal ROS  negative genitourinary   Musculoskeletal   Abdominal   Peds  Hematology negative hematology ROS (+)   Anesthesia Other Findings   Reproductive/Obstetrics negative OB ROS                            Anesthesia Physical Anesthesia Plan  ASA: III  Anesthesia Plan: General   Post-op Pain Management:    Induction: Intravenous  PONV Risk Score and Plan: 2 and Ondansetron, Treatment may vary due to age or medical condition and Midazolam  Airway Management Planned: LMA and Oral ETT  Additional Equipment:   Intra-op Plan:   Post-operative Plan: Extubation in OR  Informed Consent: I have reviewed the patients History and Physical, chart, labs and discussed the procedure including the risks, benefits and alternatives for the proposed anesthesia with the patient or authorized representative who has indicated his/her understanding and acceptance.   Dental Advisory Given  Plan Discussed with: CRNA, Anesthesiologist and  Surgeon  Anesthesia Plan Comments: ( )       Anesthesia Quick Evaluation

## 2018-06-20 NOTE — Addendum Note (Signed)
Addendum  created 06/20/18 1415 by Cleda Daub, CRNA   Intraprocedure Blocks edited, Sign clinical note

## 2018-06-20 NOTE — Transfer of Care (Signed)
Immediate Anesthesia Transfer of Care Note  Patient: [name]  Procedure(s) Performed: RIGHT GREAT TOE AMPUTATION (Right Toe)  Patient Location: PACU  Anesthesia Type:General  Level of Consciousness: drowsy  Airway & Oxygen Therapy: Patient Spontanous Breathing and Patient connected to face mask oxygen  Post-op Assessment: Report given to RN and Post -op Vital signs reviewed and stable  Post vital signs: Reviewed and stable  Last Vitals:  Vitals Value Taken Time  BP 100/46 06/20/2018 12:29 PM  Temp    Pulse 70 06/20/2018 12:31 PM  Resp 14 06/20/2018 12:31 PM  SpO2 100 % 06/20/2018 12:31 PM  Vitals shown include unvalidated device data.  Last Pain:  Vitals:   06/20/18 0919  PainSc: 0-No pain      Patients Stated Pain Goal: 2 (49/44/96 [id number])  Complications: No apparent anesthesia complications

## 2018-06-21 ENCOUNTER — Encounter (HOSPITAL_COMMUNITY): Payer: Self-pay | Admitting: Orthopedic Surgery

## 2018-06-22 ENCOUNTER — Other Ambulatory Visit: Payer: Self-pay

## 2018-06-22 MED ORDER — LEVOTHYROXINE SODIUM 100 MCG PO TABS: ORAL_TABLET | ORAL | 2 refills | Status: DC

## 2018-06-26 NOTE — Telephone Encounter (Signed)
[facility] calling back they have received the notes for this patient but they are still pending the letter of medical necessity that must be written by the MD for the CGM  Please fax to # [phone]

## 2018-06-27 ENCOUNTER — Ambulatory Visit (INDEPENDENT_AMBULATORY_CARE_PROVIDER_SITE_OTHER): Payer: Medicaid Other | Admitting: Physician Assistant

## 2018-06-27 ENCOUNTER — Encounter (INDEPENDENT_AMBULATORY_CARE_PROVIDER_SITE_OTHER): Payer: Self-pay | Admitting: Family

## 2018-06-27 VITALS — Ht 69.0 in | Wt 307.0 lb

## 2018-06-27 DIAGNOSIS — Z794 Long term (current) use of insulin: Secondary | ICD-10-CM

## 2018-06-27 DIAGNOSIS — E1142 Type 2 diabetes mellitus with diabetic polyneuropathy: Secondary | ICD-10-CM

## 2018-06-27 DIAGNOSIS — I87323 Chronic venous hypertension (idiopathic) with inflammation of bilateral lower extremity: Secondary | ICD-10-CM

## 2018-06-27 DIAGNOSIS — Z89411 Acquired absence of right great toe: Secondary | ICD-10-CM

## 2018-06-27 NOTE — Progress Notes (Signed)
Office Visit Note   Patient: Ethan Campbell           Date of Birth: Sep 04, 1960           MRN: [id number] Visit Date: 06/27/2018              Requested by: No referring provider defined for this encounter. PCP: Patient, No Pcp Per  Chief Complaint  Patient presents with  . Right Foot - Routine Post Op    Right great toe amp      HPI: The patient is a 57 year old male here with his wife for follow-up following a right great toe amputation on 06/20/2018.  He has diabetic insensate polyneuropathy of bilateral lower extremities.  He reports he has been doing well with no pain postoperatively.  He is ambulating nonweightbearing with a postop shoe and crutches as he finds this easier to use than his walker.  Assessment & Plan: Visit Diagnoses:  1. Acquired absence of right great toe ([place])   2. Type 2 diabetes mellitus with diabetic polyneuropathy, with long-term current use of insulin ([place])   3. Chronic venous hypertension (idiopathic) with inflammation of bilateral lower extremity     Plan: Operative dressings were removed.  Sutures remain intact.  Counseled patient to wash the foot with Dial soap and water in the shower and then apply 4 x 4 gauze and Ace wrapping.  He should continue nonweightbearing is much as possible in his postop shoe ambulating with crutches.  Also advised patient to begin protein supplementation with weight protein, start vitamin supplementation with multivitamin, vitamin C 500 mg twice daily and zinc supplement 1 tablet twice daily as well as probiotic supplementation to aid in wound healing.  Also recommended the patient elevate the foot higher than the level of his heart as much as possible during the day.  The patient will follow-up in 1 week.  Follow-Up Instructions: Return in about 1 week (around 07/04/2018).   Ortho Exam  Patient is alert, oriented, no adenopathy, well-dressed, normal affect, normal respiratory effort. Operative dressings were removed  from the right great toe.  The sutures are intact and under moderate tension.  There is moderate edema of the foot and right lower extremity as well as edema of the left lower extremity.  Has varicose veins which are prominent.  He has good pedal pulses.  There are no signs of cellulitis or infection currently.  There is no drainage.  Imaging: No results found. No images are attached to the encounter.  Labs: Lab Results  Component Value Date   HGBA1C 6.9 (A) 06/15/2018   HGBA1C 10.9 (A) 02/21/2018   HGBA1C 13.4 09/11/2017     Lab Results  Component Value Date   ALBUMIN 3.9 09/30/2015   ALBUMIN 4.1 09/03/2015   ALBUMIN 4.0 04/02/2014    Body mass index is 45.34 kg/m.  Orders:  No orders of the defined types were placed in this encounter.  No orders of the defined types were placed in this encounter.    Procedures: No procedures performed  Clinical Data: No additional findings.  ROS:  All other systems negative, except as noted in the HPI. Review of Systems  Objective: Vital Signs: Ht 5\' 9"  (1.753 m)   Wt (!) 307 lb (139.3 kg)   BMI 45.34 kg/m   Specialty Comments:  No specialty comments available.  PMFS History: Patient Active Problem List   Diagnosis Date Noted  . Osteomyelitis of great toe of right foot ([place]) 06/19/2018  .  Diabetic polyneuropathy associated with type 2 diabetes mellitus ([place]) 06/19/2018  . Body mass index 45.0-49.9, adult ([place]) 06/19/2018  . Seizure disorder ([place]) 08/27/2015  . Somnolence, daytime 08/27/2015  . Orthostatic hypotension 11/27/2014  . HLD (hyperlipidemia) 11/27/2014  . Syncope and collapse 08/20/2014  . Diabetic autonomic neuropathy associated with type 2 diabetes mellitus ([place]) 05/01/2014  . Dizzy spells 03/17/2014  . Chronic kidney disease, stage III (moderate) ([place]) 08/22/2013  . Type II diabetes mellitus, uncontrolled ([place]) 12/03/2009  . HYPERLIPIDEMIA 12/03/2009  . Morbid obesity ([place]) 12/03/2009  . OBSTRUCTIVE  SLEEP APNEA 12/03/2009  . ESSENTIAL HYPERTENSION, BENIGN 12/03/2009  . EDEMA 12/03/2009  . PRECORDIAL PAIN 12/03/2009  . PROTEINURIA 12/03/2009   Past Medical History:  Diagnosis Date  . Arm vein blood clot, left    on Coumadin  . Arthritis   . Cancer ([place])    Bone cancer 1987 (in knee Large cell tumor)  . Chronic kidney disease    stage 3  . Depression   . Essential hypertension   . History of stroke   . Hyperlipidemia   . Hypothyroidism   . Insomnia   . Obesity   . Precordial pain June 2011   Nuclear stress; no ischemia; EF 60%  . Presence of permanent cardiac pacemaker   . Proteinuria   . Seizure disorder ([place])   . Sick sinus syndrome ([place])   . Sleep apnea    Dr. Brandon Melnick, uses bipap  . Syncope   . Type 2 diabetes mellitus ([place])   . Venous insufficiency     Family History  Problem Relation Age of Onset  . Heart attack Mother 68  . Breast cancer Mother   . Stroke Mother   . Heart attack Father 29  . Breast cancer Sister   . Colon cancer Sister     Past Surgical History:  Procedure Laterality Date  . AMPUTATION Right 06/20/2018   Procedure: RIGHT GREAT TOE AMPUTATION;  Surgeon: Newt Minion, MD;  Location: [place] [facility];  Service: Orthopedics;  Laterality: Right;  . COLONOSCOPY    . RESECTION BONE TUMOR FEMUR  1980's   Left femur, treated at [facility] with bone graft  . TOTAL KNEE ARTHROPLASTY Left 2013   Social History   Occupational History  . Occupation: Disabled  Tobacco Use  . Smoking status: Former Smoker    Types: Cigarettes  . Smokeless tobacco: Never Used  . Tobacco comment: 11/27/14 "quit smoking years ago"  Substance and Sexual Activity  . Alcohol use: No    Alcohol/week: 0.0 standard drinks  . Drug use: No  . Sexual activity: Never

## 2018-07-04 ENCOUNTER — Encounter: Payer: Self-pay | Admitting: Endocrinology

## 2018-07-04 NOTE — Telephone Encounter (Signed)
Letter is ready.  Diabetic shoes form will not be filled out until orthopedic surgeon recommends this

## 2018-07-05 ENCOUNTER — Encounter (INDEPENDENT_AMBULATORY_CARE_PROVIDER_SITE_OTHER): Payer: Self-pay | Admitting: Physician Assistant

## 2018-07-05 ENCOUNTER — Ambulatory Visit (INDEPENDENT_AMBULATORY_CARE_PROVIDER_SITE_OTHER): Payer: Medicaid Other | Admitting: Orthopedic Surgery

## 2018-07-05 VITALS — Ht 69.0 in | Wt 307.0 lb

## 2018-07-05 DIAGNOSIS — Z89411 Acquired absence of right great toe: Secondary | ICD-10-CM

## 2018-07-05 DIAGNOSIS — Z794 Long term (current) use of insulin: Secondary | ICD-10-CM

## 2018-07-05 DIAGNOSIS — E1142 Type 2 diabetes mellitus with diabetic polyneuropathy: Secondary | ICD-10-CM

## 2018-07-05 MED ORDER — DOXYCYCLINE HYCLATE 100 MG PO TABS: 100.0000 mg | ORAL_TABLET | Freq: Two times a day (BID) | ORAL | 0 refills | Status: DC

## 2018-07-07 ENCOUNTER — Encounter (INDEPENDENT_AMBULATORY_CARE_PROVIDER_SITE_OTHER): Payer: Self-pay | Admitting: Orthopedic Surgery

## 2018-07-07 NOTE — Progress Notes (Signed)
Office Visit Note   Patient: Ethan Campbell           Date of Birth: October 07, 1960           MRN: [id number] Visit Date: 07/05/2018              Requested by: No referring provider defined for this encounter. PCP: Patient, No Pcp Per  Chief Complaint  Patient presents with  . Right Foot - Routine Post Op    06/20/18 right GT amp      HPI: Patient is a 57 year old gentleman who presents 2 weeks status post right great toe amputation.  Patient states that he is nonweightbearing describes a small amount of bloody drainage he is in a postoperative shoe using crutches.  Assessment & Plan: Visit Diagnoses:  1. Acquired absence of right great toe ([place])   2. Type 2 diabetes mellitus with diabetic polyneuropathy, with long-term current use of insulin ([place])     Plan: Patient does have serosanguineous drainage concern for healing difficulties.  We will place him on doxycycline.  Iodosorb dressing was applied.  Patient will start Dial soap cleansing dry dressing change daily.  Follow-Up Instructions: Return in about 1 week (around 07/12/2018).   Ortho Exam  Patient is alert, oriented, no adenopathy, well-dressed, normal affect, normal respiratory effort. Examination there is some slight wound dehiscence at the MTP amputation.  There is no gangrenous changes no ascending cellulitis there is serosanguineous drainage.  Imaging: No results found. No images are attached to the encounter.  Labs: Lab Results  Component Value Date   HGBA1C 6.9 (A) 06/15/2018   HGBA1C 10.9 (A) 02/21/2018   HGBA1C 13.4 09/11/2017     Lab Results  Component Value Date   ALBUMIN 3.9 09/30/2015   ALBUMIN 4.1 09/03/2015   ALBUMIN 4.0 04/02/2014    Body mass index is 45.34 kg/m.  Orders:  No orders of the defined types were placed in this encounter.  Meds ordered this encounter  Medications  . doxycycline (VIBRA-TABS) 100 MG tablet    Sig: Take 1 tablet (100 mg total) by mouth 2 (two) times  daily.    Dispense:  60 tablet    Refill:  0     Procedures: No procedures performed  Clinical Data: No additional findings.  ROS:  All other systems negative, except as noted in the HPI. Review of Systems  Objective: Vital Signs: Ht 5\' 9"  (1.753 m)   Wt (!) 307 lb (139.3 kg)   BMI 45.34 kg/m   Specialty Comments:  No specialty comments available.  PMFS History: Patient Active Problem List   Diagnosis Date Noted  . Osteomyelitis of great toe of right foot ([place]) 06/19/2018  . Diabetic polyneuropathy associated with type 2 diabetes mellitus ([place]) 06/19/2018  . Body mass index 45.0-49.9, adult ([place]) 06/19/2018  . Seizure disorder ([place]) 08/27/2015  . Somnolence, daytime 08/27/2015  . Orthostatic hypotension 11/27/2014  . HLD (hyperlipidemia) 11/27/2014  . Syncope and collapse 08/20/2014  . Diabetic autonomic neuropathy associated with type 2 diabetes mellitus ([place]) 05/01/2014  . Dizzy spells 03/17/2014  . Chronic kidney disease, stage III (moderate) ([place]) 08/22/2013  . Type II diabetes mellitus, uncontrolled ([place]) 12/03/2009  . HYPERLIPIDEMIA 12/03/2009  . Morbid obesity ([place]) 12/03/2009  . OBSTRUCTIVE SLEEP APNEA 12/03/2009  . ESSENTIAL HYPERTENSION, BENIGN 12/03/2009  . EDEMA 12/03/2009  . PRECORDIAL PAIN 12/03/2009  . PROTEINURIA 12/03/2009   Past Medical History:  Diagnosis Date  . Arm vein blood clot, left  on Coumadin  . Arthritis   . Cancer ([place])    Bone cancer 1987 (in knee Large cell tumor)  . Chronic kidney disease    stage 3  . Depression   . Essential hypertension   . History of stroke   . Hyperlipidemia   . Hypothyroidism   . Insomnia   . Obesity   . Precordial pain June 2011   Nuclear stress; no ischemia; EF 60%  . Presence of permanent cardiac pacemaker   . Proteinuria   . Seizure disorder ([place])   . Sick sinus syndrome ([place])   . Sleep apnea    Dr. Brandon Melnick, uses bipap  . Syncope   . Type 2 diabetes mellitus ([place])   . Venous  insufficiency     Family History  Problem Relation Age of Onset  . Heart attack Mother 70  . Breast cancer Mother   . Stroke Mother   . Heart attack Father 64  . Breast cancer Sister   . Colon cancer Sister     Past Surgical History:  Procedure Laterality Date  . AMPUTATION Right 06/20/2018   Procedure: RIGHT GREAT TOE AMPUTATION;  Surgeon: Newt Minion, MD;  Location: [place];  Service: Orthopedics;  Laterality: Right;  . COLONOSCOPY    . RESECTION BONE TUMOR FEMUR  1980's   Left femur, [facility] at [facility] with bone graft  . TOTAL KNEE ARTHROPLASTY Left 2013   Social History   Occupational History  . Occupation: Disabled  Tobacco Use  . Smoking status: Former Smoker    Types: Cigarettes  . Smokeless tobacco: Never Used  . Tobacco comment: 11/27/14 "quit smoking years ago"  Substance and Sexual Activity  . Alcohol use: No    Alcohol/week: 0.0 standard drinks  . Drug use: No  . Sexual activity: Never

## 2018-07-10 ENCOUNTER — Encounter (INDEPENDENT_AMBULATORY_CARE_PROVIDER_SITE_OTHER): Payer: Self-pay | Admitting: Orthopedic Surgery

## 2018-07-10 ENCOUNTER — Ambulatory Visit (INDEPENDENT_AMBULATORY_CARE_PROVIDER_SITE_OTHER): Payer: Medicaid Other | Admitting: Physician Assistant

## 2018-07-10 VITALS — Ht 69.0 in | Wt 307.0 lb

## 2018-07-10 DIAGNOSIS — Z794 Long term (current) use of insulin: Secondary | ICD-10-CM

## 2018-07-10 DIAGNOSIS — I87323 Chronic venous hypertension (idiopathic) with inflammation of bilateral lower extremity: Secondary | ICD-10-CM

## 2018-07-10 DIAGNOSIS — Z89411 Acquired absence of right great toe: Secondary | ICD-10-CM

## 2018-07-10 DIAGNOSIS — E1142 Type 2 diabetes mellitus with diabetic polyneuropathy: Secondary | ICD-10-CM

## 2018-07-10 MED ORDER — SILVER SULFADIAZINE 1 % EX CREA: 1.0000 "application " | TOPICAL_CREAM | Freq: Every day | CUTANEOUS | 0 refills | Status: DC

## 2018-07-10 NOTE — Progress Notes (Signed)
Office Visit Note   Patient: Ethan Campbell           Date of Birth: 11-Nov-1960           MRN: [id number] Visit Date: 07/10/2018              Requested by: No referring provider defined for this encounter. PCP: Patient, No Pcp Per  Chief Complaint  Patient presents with  . Right Foot - Routine Post Op      HPI: The patient is a 57 yo gentleman who is here with his family for post operative follow up following right great toe amputation on 06/20/18. He started on Doxycycline over the past week. He reports he is trying to keep the leg elevated and trying to weight bear through the right heel. He has developed a new blister over the right lateral foot as well which is draining clear fluid. The amputation site has had some minimal drainage and they report the used a toothbrush to clean the area.   Assessment & Plan: Visit Diagnoses:  1. Acquired absence of right great toe ([place])   2. Type 2 diabetes mellitus with diabetic polyneuropathy, with long-term current use of insulin ([place])   3. Chronic venous hypertension (idiopathic) with inflammation of bilateral lower extremity     Plan: Sutures harvested this visit, and applied silvadene to the operative site and to the blister over the right lateral foot and they will do this daily. Continue on Doxycycline 100 mg BID. Elevate and off load the right foot as much as possible.  Follow up later this week.   Follow-Up Instructions: Return in about 2 days (around 07/12/2018).   Ortho Exam  Patient is alert, oriented, no adenopathy, well-dressed, normal affect, normal respiratory effort. The right great toe amputation sight has some slight drainage, erythema and edema over the operative site.The sutures were removed from the operative site. There is peri incisional maceration and skin was easily debrided from the area.   There is a new blister which has opened and has pink tissue over the wound bed.   Imaging: No results found. No images  are attached to the encounter.  Labs: Lab Results  Component Value Date   HGBA1C 6.9 (A) 06/15/2018   HGBA1C 10.9 (A) 02/21/2018   HGBA1C 13.4 09/11/2017     Lab Results  Component Value Date   ALBUMIN 3.9 09/30/2015   ALBUMIN 4.1 09/03/2015   ALBUMIN 4.0 04/02/2014    Body mass index is 45.34 kg/m.  Orders:  No orders of the defined types were placed in this encounter.  Meds ordered this encounter  Medications  . silver sulfADIAZINE (SILVADENE) 1 % cream    Sig: Apply 1 application topically daily. Apply to right foot daily    Dispense:  50 g    Refill:  0     Procedures: No procedures performed  Clinical Data: No additional findings.  ROS:  All other systems negative, except as noted in the HPI. Review of Systems  Objective: Vital Signs: Ht 5\' 9"  (1.753 m)   Wt (!) 307 lb (139.3 kg)   BMI 45.34 kg/m   Specialty Comments:  No specialty comments available.  PMFS History: Patient Active Problem List   Diagnosis Date Noted  . Osteomyelitis of great toe of right foot ([place]) 06/19/2018  . Diabetic polyneuropathy associated with type 2 diabetes mellitus ([place]) 06/19/2018  . Body mass index 45.0-49.9, adult ([place]) 06/19/2018  . Seizure disorder ([place]) 08/27/2015  .  Somnolence, daytime 08/27/2015  . Orthostatic hypotension 11/27/2014  . HLD (hyperlipidemia) 11/27/2014  . Syncope and collapse 08/20/2014  . Diabetic autonomic neuropathy associated with type 2 diabetes mellitus ([place]) 05/01/2014  . Dizzy spells 03/17/2014  . Chronic kidney disease, stage III (moderate) ([place]) 08/22/2013  . Type II diabetes mellitus, uncontrolled ([place]) 12/03/2009  . HYPERLIPIDEMIA 12/03/2009  . Morbid obesity ([place]) 12/03/2009  . OBSTRUCTIVE SLEEP APNEA 12/03/2009  . ESSENTIAL HYPERTENSION, BENIGN 12/03/2009  . EDEMA 12/03/2009  . PRECORDIAL PAIN 12/03/2009  . PROTEINURIA 12/03/2009   Past Medical History:  Diagnosis Date  . Arm vein blood clot, left    on Coumadin  .  Arthritis   . Cancer ([place])    Bone cancer 1987 (in knee Large cell tumor)  . Chronic kidney disease    stage 3  . Depression   . Essential hypertension   . History of stroke   . Hyperlipidemia   . Hypothyroidism   . Insomnia   . Obesity   . Precordial pain June 2011   Nuclear stress; no ischemia; EF 60%  . Presence of permanent cardiac pacemaker   . Proteinuria   . Seizure disorder ([place])   . Sick sinus syndrome ([place])   . Sleep apnea    Dr. Brandon Melnick, uses bipap  . Syncope   . Type 2 diabetes mellitus ([place])   . Venous insufficiency     Family History  Problem Relation Age of Onset  . Heart attack Mother 41  . Breast cancer Mother   . Stroke Mother   . Heart attack Father 26  . Breast cancer Sister   . Colon cancer Sister     Past Surgical History:  Procedure Laterality Date  . AMPUTATION Right 06/20/2018   Procedure: RIGHT GREAT TOE AMPUTATION;  Surgeon: Newt Minion, MD;  Location: [place];  Service: Orthopedics;  Laterality: Right;  . COLONOSCOPY    . RESECTION BONE TUMOR FEMUR  1980's   Left femur, treated at [facility] with bone graft  . TOTAL KNEE ARTHROPLASTY Left 2013   Social History   Occupational History  . Occupation: Disabled  Tobacco Use  . Smoking status: Former Smoker    Types: Cigarettes  . Smokeless tobacco: Never Used  . Tobacco comment: 11/27/14 "quit smoking years ago"  Substance and Sexual Activity  . Alcohol use: No    Alcohol/week: 0.0 standard drinks  . Drug use: No  . Sexual activity: Never

## 2018-07-12 ENCOUNTER — Ambulatory Visit (INDEPENDENT_AMBULATORY_CARE_PROVIDER_SITE_OTHER): Payer: Medicaid Other | Admitting: Physician Assistant

## 2018-07-12 ENCOUNTER — Encounter (INDEPENDENT_AMBULATORY_CARE_PROVIDER_SITE_OTHER): Payer: Self-pay | Admitting: Physician Assistant

## 2018-07-12 VITALS — Ht 69.0 in | Wt 307.0 lb

## 2018-07-12 DIAGNOSIS — I87323 Chronic venous hypertension (idiopathic) with inflammation of bilateral lower extremity: Secondary | ICD-10-CM

## 2018-07-12 DIAGNOSIS — Z89411 Acquired absence of right great toe: Secondary | ICD-10-CM

## 2018-07-12 DIAGNOSIS — E1142 Type 2 diabetes mellitus with diabetic polyneuropathy: Secondary | ICD-10-CM

## 2018-07-12 DIAGNOSIS — Z794 Long term (current) use of insulin: Secondary | ICD-10-CM

## 2018-07-13 ENCOUNTER — Encounter (INDEPENDENT_AMBULATORY_CARE_PROVIDER_SITE_OTHER): Payer: Self-pay | Admitting: Physician Assistant

## 2018-07-13 NOTE — Progress Notes (Signed)
Office Visit Note   Patient: Ethan Campbell           Date of Birth: [birth year]           MRN: [id number] Visit Date: 07/12/2018              Requested by: No referring provider defined for this encounter. PCP: Patient, No Pcp Per  Chief Complaint  Patient presents with  . Right Foot - Routine Post Op    Right great Toe amp      HPI: Patient is a 57 year old gentleman who presents 3 weeks status post right great toe amputation.  He is weightbearing with crutches he states he has a little bit of drainage he denies any pain.  Assessment & Plan: Visit Diagnoses:  1. Right great toe amputee ([place])   2. Type 2 diabetes mellitus with diabetic polyneuropathy, with long-term current use of insulin ([place])   3. Chronic venous hypertension (idiopathic) with inflammation of bilateral lower extremity     Plan: Discussed the importance of nonweightbearing continue his antibiotics daily dressing changes reevaluate in 2 weeks.  Discussed that if there is further wound breakdown with exposed bone we would need to consider revision surgery for first ray amputation.  Follow-Up Instructions: Return in about 2 weeks (around 07/26/2018).   Ortho Exam  Patient is alert, oriented, no adenopathy, well-dressed, normal affect, normal respiratory effort. Examination patient has a good pulse there is no ascending cellulitis.  There is some mild wound dehiscence with no exposed bone.  Imaging: No results found. No images are attached to the encounter.  Labs: Lab Results  Component Value Date   HGBA1C 6.9 (A) 06/15/2018   HGBA1C 10.9 (A) 02/21/2018   HGBA1C 13.4 09/11/2017     Lab Results  Component Value Date   ALBUMIN 3.9 09/30/2015   ALBUMIN 4.1 09/03/2015   ALBUMIN 4.0 04/02/2014    Body mass index is 45.34 kg/m.  Orders:  No orders of the defined types were placed in this encounter.  No orders of the defined types were placed in this encounter.    Procedures: No procedures  performed  Clinical Data: No additional findings.  ROS:  All other systems negative, except as noted in the HPI. Review of Systems  Objective: Vital Signs: Ht 5\' 9"  (1.753 m)   Wt (!) 307 lb (139.3 kg)   BMI 45.34 kg/m   Specialty Comments:  No specialty comments available.  PMFS History: Patient Active Problem List   Diagnosis Date Noted  . Osteomyelitis of great toe of right foot ([place]) 06/19/2018  . Diabetic polyneuropathy associated with type 2 diabetes mellitus ([place]) 06/19/2018  . Body mass index 45.0-49.9, adult ([place]) 06/19/2018  . Seizure disorder ([place]) 08/27/2015  . Somnolence, daytime 08/27/2015  . Orthostatic hypotension 11/27/2014  . HLD (hyperlipidemia) 11/27/2014  . Syncope and collapse 08/20/2014  . Diabetic autonomic neuropathy associated with type 2 diabetes mellitus ([place]) 05/01/2014  . Dizzy spells 03/17/2014  . Chronic kidney disease, stage III (moderate) ([place]) 08/22/2013  . Type II diabetes mellitus, uncontrolled ([place]) 12/03/2009  . HYPERLIPIDEMIA 12/03/2009  . Morbid obesity ([place]) 12/03/2009  . OBSTRUCTIVE SLEEP APNEA 12/03/2009  . ESSENTIAL HYPERTENSION, BENIGN 12/03/2009  . EDEMA 12/03/2009  . PRECORDIAL PAIN 12/03/2009  . PROTEINURIA 12/03/2009   Past Medical History:  Diagnosis Date  . Arm vein blood clot, left    on Coumadin  . Arthritis   . Cancer ([place])    Bone cancer 1987 (in knee  Large cell tumor)  . Chronic kidney disease    stage 3  . Depression   . Essential hypertension   . History of stroke   . Hyperlipidemia   . Hypothyroidism   . Insomnia   . Obesity   . Precordial pain June 2011   Nuclear stress; no ischemia; EF 60%  . Presence of permanent cardiac pacemaker   . Proteinuria   . Seizure disorder ([place])   . Sick sinus syndrome ([place])   . Sleep apnea    Dr. Brandon Melnick, uses bipap  . Syncope   . Type 2 diabetes mellitus ([place])   . Venous insufficiency     Family History  Problem Relation Age of Onset  . Heart attack  Mother 64  . Breast cancer Mother   . Stroke Mother   . Heart attack Father 13  . Breast cancer Sister   . Colon cancer Sister     Past Surgical History:  Procedure Laterality Date  . AMPUTATION Right 06/20/2018   Procedure: RIGHT GREAT TOE AMPUTATION;  Surgeon: Newt Minion, MD;  Location: [place];  Service: Orthopedics;  Laterality: Right;  . COLONOSCOPY    . RESECTION BONE TUMOR FEMUR  1980's   Left femur, treated at [facility] with bone graft  . TOTAL KNEE ARTHROPLASTY Left 2013   Social History   Occupational History  . Occupation: Disabled  Tobacco Use  . Smoking status: Former Smoker    Types: Cigarettes  . Smokeless tobacco: Never Used  . Tobacco comment: 11/27/14 "quit smoking years ago"  Substance and Sexual Activity  . Alcohol use: No    Alcohol/week: 0.0 standard drinks  . Drug use: No  . Sexual activity: Never

## 2018-07-19 ENCOUNTER — Encounter (INDEPENDENT_AMBULATORY_CARE_PROVIDER_SITE_OTHER): Payer: Self-pay | Admitting: Physician Assistant

## 2018-07-19 ENCOUNTER — Ambulatory Visit (INDEPENDENT_AMBULATORY_CARE_PROVIDER_SITE_OTHER): Payer: Medicaid Other | Admitting: Physician Assistant

## 2018-07-19 DIAGNOSIS — Z89411 Acquired absence of right great toe: Secondary | ICD-10-CM

## 2018-07-19 DIAGNOSIS — E1142 Type 2 diabetes mellitus with diabetic polyneuropathy: Secondary | ICD-10-CM

## 2018-07-19 DIAGNOSIS — Z794 Long term (current) use of insulin: Secondary | ICD-10-CM

## 2018-07-19 DIAGNOSIS — I87323 Chronic venous hypertension (idiopathic) with inflammation of bilateral lower extremity: Secondary | ICD-10-CM

## 2018-07-19 DIAGNOSIS — Z6841 Body Mass Index (BMI) 40.0 and over, adult: Secondary | ICD-10-CM

## 2018-07-19 NOTE — Progress Notes (Signed)
Office Visit Note   Patient: Ethan Campbell           Date of Birth: 04/23/1961           MRN: [id number] Visit Date: 07/19/2018              Requested by: No referring provider defined for this encounter. PCP: Patient, No Pcp Per  Chief Complaint  Patient presents with  . Right Foot - Follow-up      HPI: The patient is a 57 yo gentleman here with his wife who is seen for post operative follow up following a right great toe amputation on 06/20/18. They report that a large amount of foul smelling brown drainage came out of a large blister that formed over the plantar and medial foot about 2 days ago. Following this episode, they report the foot has been looking much better. He is on Doxycycline and using some silvadene cream to the sites. He reports no pain over the foot.   Assessment & Plan: Visit Diagnoses:  1. Right great toe amputee ([place])   2. Type 2 diabetes mellitus with diabetic polyneuropathy, with long-term current use of insulin ([place])   3. Chronic venous hypertension (idiopathic) with inflammation of bilateral lower extremity   4. Body mass index 45.0-49.9, adult ([facility])     Plan: Continue Doxycycline 100 mg BID and silvadene cream to the open areas daily after cleaning with Dial soap and water. Continue to off load and elevate as much as possible. Follow up in 1 week.   Follow-Up Instructions: Return in about 1 week (around 07/26/2018).   Ortho Exam  Patient is alert, oriented, no adenopathy, well-dressed, normal affect, normal respiratory effort. The right great toe amputation site has a small pin hole opening with minimal serous drainage expressed. There is medial blister which has opened and there is dried erythematous tissue present. Less erythema and edema over the foot overall.   Imaging: No results found. No images are attached to the encounter.  Labs: Lab Results  Component Value Date   HGBA1C 6.9 (A) 06/15/2018   HGBA1C 10.9 (A) 02/21/2018   HGBA1C  13.4 09/11/2017     Lab Results  Component Value Date   ALBUMIN 3.9 09/30/2015   ALBUMIN 4.1 09/03/2015   ALBUMIN 4.0 04/02/2014    There is no height or weight on file to calculate BMI.  Orders:  No orders of the defined types were placed in this encounter.  No orders of the defined types were placed in this encounter.    Procedures: No procedures performed  Clinical Data: No additional findings.  ROS:  All other systems negative, except as noted in the HPI. Review of Systems  Objective: Vital Signs: There were no vitals taken for this visit.  Specialty Comments:  No specialty comments available.  PMFS History: Patient Active Problem List   Diagnosis Date Noted  . Osteomyelitis of great toe of right foot ([place]) 06/19/2018  . Diabetic polyneuropathy associated with type 2 diabetes mellitus ([place]) 06/19/2018  . Body mass index 45.0-49.9, adult ([place]) 06/19/2018  . Seizure disorder ([place]) 08/27/2015  . Somnolence, daytime 08/27/2015  . Orthostatic hypotension 11/27/2014  . HLD (hyperlipidemia) 11/27/2014  . Syncope and collapse 08/20/2014  . Diabetic autonomic neuropathy associated with type 2 diabetes mellitus ([place]) 05/01/2014  . Dizzy spells 03/17/2014  . Chronic kidney disease, stage III (moderate) ([place]) 08/22/2013  . Type II diabetes mellitus, uncontrolled ([place]) 12/03/2009  . HYPERLIPIDEMIA 12/03/2009  . Morbid  obesity ([place]) 12/03/2009  . OBSTRUCTIVE SLEEP APNEA 12/03/2009  . ESSENTIAL HYPERTENSION, BENIGN 12/03/2009  . EDEMA 12/03/2009  . PRECORDIAL PAIN 12/03/2009  . PROTEINURIA 12/03/2009   Past Medical History:  Diagnosis Date  . Arm vein blood clot, left    on Coumadin  . Arthritis   . Cancer ([place])    Bone cancer 1987 (in knee Large cell tumor)  . Chronic kidney disease    stage 3  . Depression   . Essential hypertension   . History of stroke   . Hyperlipidemia   . Hypothyroidism   . Insomnia   . Obesity   . Precordial pain June 2011    Nuclear stress; no ischemia; EF 60%  . Presence of permanent cardiac pacemaker   . Proteinuria   . Seizure disorder ([place])   . Sick sinus syndrome ([place])   . Sleep apnea    Dr. Brandon Melnick, uses bipap  . Syncope   . Type 2 diabetes mellitus ([place])   . Venous insufficiency     Family History  Problem Relation Age of Onset  . Heart attack Mother 5  . Breast cancer Mother   . Stroke Mother   . Heart attack Father 75  . Breast cancer Sister   . Colon cancer Sister     Past Surgical History:  Procedure Laterality Date  . AMPUTATION Right 06/20/2018   Procedure: RIGHT GREAT TOE AMPUTATION;  Surgeon: Newt Minion, MD;  Location: [place];  Service: Orthopedics;  Laterality: Right;  . COLONOSCOPY    . RESECTION BONE TUMOR FEMUR  1980's   Left femur, [facility] at [facility] with bone graft  . TOTAL KNEE ARTHROPLASTY Left 2013   Social History   Occupational History  . Occupation: Disabled  Tobacco Use  . Smoking status: Former Smoker    Types: Cigarettes  . Smokeless tobacco: Never Used  . Tobacco comment: 11/27/14 "quit smoking years ago"  Substance and Sexual Activity  . Alcohol use: No    Alcohol/week: 0.0 standard drinks  . Drug use: No  . Sexual activity: Never

## 2018-07-26 ENCOUNTER — Ambulatory Visit (INDEPENDENT_AMBULATORY_CARE_PROVIDER_SITE_OTHER): Payer: Medicaid Other | Admitting: Orthopedic Surgery

## 2018-07-26 ENCOUNTER — Encounter (INDEPENDENT_AMBULATORY_CARE_PROVIDER_SITE_OTHER): Payer: Self-pay | Admitting: Physician Assistant

## 2018-07-26 VITALS — Ht 69.0 in | Wt 307.0 lb

## 2018-07-26 DIAGNOSIS — Z89411 Acquired absence of right great toe: Secondary | ICD-10-CM

## 2018-07-26 MED ORDER — DOXYCYCLINE HYCLATE 100 MG PO TABS: 100.0000 mg | ORAL_TABLET | Freq: Two times a day (BID) | ORAL | 0 refills | Status: DC

## 2018-07-31 ENCOUNTER — Encounter (INDEPENDENT_AMBULATORY_CARE_PROVIDER_SITE_OTHER): Payer: Self-pay | Admitting: Orthopedic Surgery

## 2018-07-31 NOTE — Progress Notes (Signed)
Office Visit Note   Patient: Ethan Campbell           Date of Birth: Apr 26, 1961           MRN: [id number] Visit Date: 07/26/2018              Requested by: No referring provider defined for this encounter. PCP: Patient, No Pcp Per  Chief Complaint  Patient presents with  . Right Foot - Routine Post Op      HPI: Patient is a 58 year old gentleman who presents status post right great toe amputation he is weightbearing as tolerated with crutches patient states he has increased edema and redness and some drainage denies any pain denies any numbness.  Assessment & Plan: Visit Diagnoses:  1. Right great toe amputee [facility])     Plan: Prescription provided for doxycycline.  Recommended Dial soap cleansing minimize weightbearing.  Follow-Up Instructions: Return in about 1 week (around 08/02/2018).   Ortho Exam  Patient is alert, oriented, no adenopathy, well-dressed, normal affect, normal respiratory effort. Examination patient has multiple areas of abrasion and bruising over the lateral aspect of the right foot there is swelling and granulation tissue at the great toe amputation site there is a wound that is 10 mm in diameter 1 mm deep there is no tenderness to palpation there is no cellulitis no drainage.  I am concerned with increased swelling the bruising over the lateral aspect of his foot from too much pressure over the lateral aspect of his foot.  Risk of wound breakdown risk of infection.  Imaging: No results found. No images are attached to the encounter.  Labs: Lab Results  Component Value Date   HGBA1C 6.9 (A) 06/15/2018   HGBA1C 10.9 (A) 02/21/2018   HGBA1C 13.4 09/11/2017     Lab Results  Component Value Date   ALBUMIN 3.9 09/30/2015   ALBUMIN 4.1 09/03/2015   ALBUMIN 4.0 04/02/2014    Body mass index is 45.34 kg/m.  Orders:  No orders of the defined types were placed in this encounter.  Meds ordered this encounter  Medications  . doxycycline  (VIBRA-TABS) 100 MG tablet    Sig: Take 1 tablet (100 mg total) by mouth 2 (two) times daily.    Dispense:  60 tablet    Refill:  0     Procedures: No procedures performed  Clinical Data: No additional findings.  ROS:  All other systems negative, except as noted in the HPI. Review of Systems  Objective: Vital Signs: Ht 5\' 9"  (1.753 m)   Wt (!) 307 lb (139.3 kg)   BMI 45.34 kg/m   Specialty Comments:  No specialty comments available.  PMFS History: Patient Active Problem List   Diagnosis Date Noted  . Osteomyelitis of great toe of right foot ([place]) 06/19/2018  . Diabetic polyneuropathy associated with type 2 diabetes mellitus ([place]) 06/19/2018  . Body mass index 45.0-49.9, adult ([place]) 06/19/2018  . Seizure disorder ([place]) 08/27/2015  . Somnolence, daytime 08/27/2015  . Orthostatic hypotension 11/27/2014  . HLD (hyperlipidemia) 11/27/2014  . Syncope and collapse 08/20/2014  . Diabetic autonomic neuropathy associated with type 2 diabetes mellitus ([place]) 05/01/2014  . Dizzy spells 03/17/2014  . Chronic kidney disease, stage III (moderate) ([place]) 08/22/2013  . Type II diabetes mellitus, uncontrolled ([place]) 12/03/2009  . HYPERLIPIDEMIA 12/03/2009  . Morbid obesity ([place]) 12/03/2009  . OBSTRUCTIVE SLEEP APNEA 12/03/2009  . ESSENTIAL HYPERTENSION, BENIGN 12/03/2009  . EDEMA 12/03/2009  . PRECORDIAL PAIN 12/03/2009  .  PROTEINURIA 12/03/2009   Past Medical History:  Diagnosis Date  . Arm vein blood clot, left    on Coumadin  . Arthritis   . Cancer ([place])    Bone cancer 1987 (in knee Large cell tumor)  . Chronic kidney disease    stage 3  . Depression   . Essential hypertension   . History of stroke   . Hyperlipidemia   . Hypothyroidism   . Insomnia   . Obesity   . Precordial pain June 2011   Nuclear stress; no ischemia; EF 60%  . Presence of permanent cardiac pacemaker   . Proteinuria   . Seizure disorder ([place])   . Sick sinus syndrome ([place])   . Sleep apnea    Dr.  Brandon Melnick, uses bipap  . Syncope   . Type 2 diabetes mellitus ([place])   . Venous insufficiency     Family History  Problem Relation Age of Onset  . Heart attack Mother 5  . Breast cancer Mother   . Stroke Mother   . Heart attack Father 70  . Breast cancer Sister   . Colon cancer Sister     Past Surgical History:  Procedure Laterality Date  . AMPUTATION Right 06/20/2018   Procedure: RIGHT GREAT TOE AMPUTATION;  Surgeon: Newt Minion, MD;  Location: [place];  Service: Orthopedics;  Laterality: Right;  . COLONOSCOPY    . RESECTION BONE TUMOR FEMUR  1980's   Left femur, [facility] at [facility] with bone graft  . TOTAL KNEE ARTHROPLASTY Left 2013   Social History   Occupational History  . Occupation: Disabled  Tobacco Use  . Smoking status: Former Smoker    Types: Cigarettes  . Smokeless tobacco: Never Used  . Tobacco comment: 11/27/14 "quit smoking years ago"  Substance and Sexual Activity  . Alcohol use: No    Alcohol/week: 0.0 standard drinks  . Drug use: No  . Sexual activity: Never

## 2018-08-02 ENCOUNTER — Encounter (INDEPENDENT_AMBULATORY_CARE_PROVIDER_SITE_OTHER): Payer: Self-pay | Admitting: Physician Assistant

## 2018-08-02 ENCOUNTER — Ambulatory Visit (INDEPENDENT_AMBULATORY_CARE_PROVIDER_SITE_OTHER): Payer: Medicaid Other | Admitting: Physician Assistant

## 2018-08-02 DIAGNOSIS — Z89411 Acquired absence of right great toe: Secondary | ICD-10-CM

## 2018-08-02 DIAGNOSIS — Z6841 Body Mass Index (BMI) 40.0 and over, adult: Secondary | ICD-10-CM

## 2018-08-02 DIAGNOSIS — I87323 Chronic venous hypertension (idiopathic) with inflammation of bilateral lower extremity: Secondary | ICD-10-CM

## 2018-08-02 DIAGNOSIS — Z794 Long term (current) use of insulin: Secondary | ICD-10-CM

## 2018-08-02 DIAGNOSIS — E1142 Type 2 diabetes mellitus with diabetic polyneuropathy: Secondary | ICD-10-CM

## 2018-08-02 NOTE — Progress Notes (Signed)
Office Visit Note   Patient: Ethan Campbell           Date of Birth: 03-21-1961           MRN: [id number] Visit Date: 08/02/2018              Requested by: No referring provider defined for this encounter. PCP: Patient, No Pcp Per  Chief Complaint  Patient presents with  . Right Foot - Follow-up      HPI: The patient is a 58 yo gentleman who is seen for post operative follow up following right great toe amputation on 06/20/2018. He has had less drainage from the area. He reports no odor. He has been using iodosorb to the open area. He is minimizing weight bearing as much as possible. He is taking Doxycycline.   Assessment & Plan: Visit Diagnoses:  1. Right great toe amputee ([place])   2. Type 2 diabetes mellitus with diabetic polyneuropathy, with long-term current use of insulin ([place])   3. Chronic venous hypertension (idiopathic) with inflammation of bilateral lower extremity   4. Body mass index 45.0-49.9, adult (HCC)     Plan: Continue to wash area daily with Dial soap and water and apply iodosorb to the area and then gauze and Ace wrap. Continue non weight bearing and elevate as much as possible. Continue Doxycycline.  Follow up in 1 week.   Follow-Up Instructions: Return in about 1 week (around 08/09/2018).   Ortho Exam  Patient is alert, oriented, no adenopathy, well-dressed, normal affect, normal respiratory effort. The right foot great toe amputation site is open with ~ 8 mm diameter wound with fat layer and scant serosanguinous drainage without odor. Mild erythema of the area, mild edema. palpable pedal pulse. Healing superficial blisters over the dorsum of the right foot. Less ecchymosis over the foot.   Imaging: No results found. No images are attached to the encounter.  Labs: Lab Results  Component Value Date   HGBA1C 6.9 (A) 06/15/2018   HGBA1C 10.9 (A) 02/21/2018   HGBA1C 13.4 09/11/2017     Lab Results  Component Value Date   ALBUMIN 3.9 09/30/2015     ALBUMIN 4.1 09/03/2015   ALBUMIN 4.0 04/02/2014    There is no height or weight on file to calculate BMI.  Orders:  No orders of the defined types were placed in this encounter.  No orders of the defined types were placed in this encounter.    Procedures: No procedures performed  Clinical Data: No additional findings.  ROS:  All other systems negative, except as noted in the HPI. Review of Systems  Objective: Vital Signs: There were no vitals taken for this visit.  Specialty Comments:  No specialty comments available.  PMFS History: Patient Active Problem List   Diagnosis Date Noted  . Osteomyelitis of great toe of right foot ([place]) 06/19/2018  . Diabetic polyneuropathy associated with type 2 diabetes mellitus ([place]) 06/19/2018  . Body mass index 45.0-49.9, adult ([place]) 06/19/2018  . Seizure disorder ([place]) 08/27/2015  . Somnolence, daytime 08/27/2015  . Orthostatic hypotension 11/27/2014  . HLD (hyperlipidemia) 11/27/2014  . Syncope and collapse 08/20/2014  . Diabetic autonomic neuropathy associated with type 2 diabetes mellitus ([place]) 05/01/2014  . Dizzy spells 03/17/2014  . Chronic kidney disease, stage III (moderate) ([place]) 08/22/2013  . Type II diabetes mellitus, uncontrolled ([place]) 12/03/2009  . HYPERLIPIDEMIA 12/03/2009  . Morbid obesity ([place]) 12/03/2009  . OBSTRUCTIVE SLEEP APNEA 12/03/2009  . ESSENTIAL HYPERTENSION, BENIGN 12/03/2009  .  EDEMA 12/03/2009  . PRECORDIAL PAIN 12/03/2009  . PROTEINURIA 12/03/2009   Past Medical History:  Diagnosis Date  . Arm vein blood clot, left    on Coumadin  . Arthritis   . Cancer ([place])    Bone cancer 1987 (in knee Large cell tumor)  . Chronic kidney disease    stage 3  . Depression   . Essential hypertension   . History of stroke   . Hyperlipidemia   . Hypothyroidism   . Insomnia   . Obesity   . Precordial pain June 2011   Nuclear stress; no ischemia; EF 60%  . Presence of permanent cardiac pacemaker   .  Proteinuria   . Seizure disorder ([place])   . Sick sinus syndrome ([place])   . Sleep apnea    Dr. Brandon Melnick, uses bipap  . Syncope   . Type 2 diabetes mellitus ([place])   . Venous insufficiency     Family History  Problem Relation Age of Onset  . Heart attack Mother 71  . Breast cancer Mother   . Stroke Mother   . Heart attack Father 62  . Breast cancer Sister   . Colon cancer Sister     Past Surgical History:  Procedure Laterality Date  . AMPUTATION Right 06/20/2018   Procedure: RIGHT GREAT TOE AMPUTATION;  Surgeon: Newt Minion, MD;  Location: [place];  Service: Orthopedics;  Laterality: Right;  . COLONOSCOPY    . RESECTION BONE TUMOR FEMUR  1980's   Left femur, [facility] at [facility] with bone graft  . TOTAL KNEE ARTHROPLASTY Left 2013   Social History   Occupational History  . Occupation: Disabled  Tobacco Use  . Smoking status: Former Smoker    Types: Cigarettes  . Smokeless tobacco: Never Used  . Tobacco comment: 11/27/14 "quit smoking years ago"  Substance and Sexual Activity  . Alcohol use: No    Alcohol/week: 0.0 standard drinks  . Drug use: No  . Sexual activity: Never

## 2018-08-09 ENCOUNTER — Encounter (INDEPENDENT_AMBULATORY_CARE_PROVIDER_SITE_OTHER): Payer: Self-pay | Admitting: Physician Assistant

## 2018-08-09 ENCOUNTER — Ambulatory Visit (INDEPENDENT_AMBULATORY_CARE_PROVIDER_SITE_OTHER): Payer: Medicaid Other | Admitting: Orthopedic Surgery

## 2018-08-09 VITALS — Ht 69.0 in | Wt 307.0 lb

## 2018-08-09 DIAGNOSIS — Z89411 Acquired absence of right great toe: Secondary | ICD-10-CM

## 2018-08-09 DIAGNOSIS — I87323 Chronic venous hypertension (idiopathic) with inflammation of bilateral lower extremity: Secondary | ICD-10-CM

## 2018-08-09 DIAGNOSIS — Z794 Long term (current) use of insulin: Secondary | ICD-10-CM

## 2018-08-09 DIAGNOSIS — E1142 Type 2 diabetes mellitus with diabetic polyneuropathy: Secondary | ICD-10-CM

## 2018-08-10 ENCOUNTER — Encounter (INDEPENDENT_AMBULATORY_CARE_PROVIDER_SITE_OTHER): Payer: Self-pay | Admitting: Orthopedic Surgery

## 2018-08-10 NOTE — Progress Notes (Signed)
Office Visit Note   Patient: Ethan Campbell           Date of Birth: Jun 12, 1961           MRN: [id number] Visit Date: 08/09/2018              Requested by: No referring provider defined for this encounter. PCP: Patient, No Pcp Per  Chief Complaint  Patient presents with  . Right Foot - Follow-up    RT GRT TOE      HPI: Patient is a 58 year old gentleman who presents in follow-up status post great toe amputation with patient's venous insufficiency he has had increased swelling and drainage and cellulitis he is currently on doxycycline.  Assessment & Plan: Visit Diagnoses:  1. Right great toe amputee ([place])   2. Type 2 diabetes mellitus with diabetic polyneuropathy, with long-term current use of insulin ([place])   3. Chronic venous hypertension (idiopathic) with inflammation of bilateral lower extremity     Plan: Patient will continue with the doxycycline continue with elevation and compression dressing changes daily.  Follow-Up Instructions: Return in about 2 weeks (around 08/23/2018).   Ortho Exam  Patient is alert, oriented, no adenopathy, well-dressed, normal affect, normal respiratory effort. Examination patient has decreased redness and swelling there is no drainage there is a small wound that is 5 mm in diameter 1 mm deep.  He has a strong dorsalis pedis pulse.  No tenderness to palpation no clinical signs of infection.  Imaging: No results found. No images are attached to the encounter.  Labs: Lab Results  Component Value Date   HGBA1C 6.9 (A) 06/15/2018   HGBA1C 10.9 (A) 02/21/2018   HGBA1C 13.4 09/11/2017     Lab Results  Component Value Date   ALBUMIN 3.9 09/30/2015   ALBUMIN 4.1 09/03/2015   ALBUMIN 4.0 04/02/2014    Body mass index is 45.34 kg/m.  Orders:  No orders of the defined types were placed in this encounter.  No orders of the defined types were placed in this encounter.    Procedures: No procedures performed  Clinical Data: No  additional findings.  ROS:  All other systems negative, except as noted in the HPI. Review of Systems  Objective: Vital Signs: Ht 5\' 9"  (1.753 m)   Wt (!) 307 lb (139.3 kg)   BMI 45.34 kg/m   Specialty Comments:  No specialty comments available.  PMFS History: Patient Active Problem List   Diagnosis Date Noted  . Osteomyelitis of great toe of right foot ([place]) 06/19/2018  . Diabetic polyneuropathy associated with type 2 diabetes mellitus ([place]) 06/19/2018  . Body mass index 45.0-49.9, adult ([place]) 06/19/2018  . Seizure disorder ([place]) 08/27/2015  . Somnolence, daytime 08/27/2015  . Orthostatic hypotension 11/27/2014  . HLD (hyperlipidemia) 11/27/2014  . Syncope and collapse 08/20/2014  . Diabetic autonomic neuropathy associated with type 2 diabetes mellitus ([place]) 05/01/2014  . Dizzy spells 03/17/2014  . Chronic kidney disease, stage III (moderate) ([place]) 08/22/2013  . Type II diabetes mellitus, uncontrolled ([place]) 12/03/2009  . HYPERLIPIDEMIA 12/03/2009  . Morbid obesity ([place]) 12/03/2009  . OBSTRUCTIVE SLEEP APNEA 12/03/2009  . ESSENTIAL HYPERTENSION, BENIGN 12/03/2009  . EDEMA 12/03/2009  . PRECORDIAL PAIN 12/03/2009  . PROTEINURIA 12/03/2009   Past Medical History:  Diagnosis Date  . Arm vein blood clot, left    on Coumadin  . Arthritis   . Cancer ([place])    Bone cancer 1987 (in knee Large cell tumor)  . Chronic kidney disease  stage 3  . Depression   . Essential hypertension   . History of stroke   . Hyperlipidemia   . Hypothyroidism   . Insomnia   . Obesity   . Precordial pain June 2011   Nuclear stress; no ischemia; EF 60%  . Presence of permanent cardiac pacemaker   . Proteinuria   . Seizure disorder ([place])   . Sick sinus syndrome ([place])   . Sleep apnea    Dr. Brandon Melnick, uses bipap  . Syncope   . Type 2 diabetes mellitus ([place])   . Venous insufficiency     Family History  Problem Relation Age of Onset  . Heart attack Mother 68  . Breast cancer Mother     . Stroke Mother   . Heart attack Father 34  . Breast cancer Sister   . Colon cancer Sister     Past Surgical History:  Procedure Laterality Date  . AMPUTATION Right 06/20/2018   Procedure: RIGHT GREAT TOE AMPUTATION;  Surgeon: Newt Minion, MD;  Location: [place];  Service: Orthopedics;  Laterality: Right;  . COLONOSCOPY    . RESECTION BONE TUMOR FEMUR  1980's   Left femur, [facility] at [facility] with bone graft  . TOTAL KNEE ARTHROPLASTY Left 2013   Social History   Occupational History  . Occupation: Disabled  Tobacco Use  . Smoking status: Former Smoker    Types: Cigarettes  . Smokeless tobacco: Never Used  . Tobacco comment: 11/27/14 "quit smoking years ago"  Substance and Sexual Activity  . Alcohol use: No    Alcohol/week: 0.0 standard drinks  . Drug use: No  . Sexual activity: Never

## 2018-08-15 ENCOUNTER — Ambulatory Visit (INDEPENDENT_AMBULATORY_CARE_PROVIDER_SITE_OTHER): Payer: Medicaid Other | Admitting: Endocrinology

## 2018-08-15 ENCOUNTER — Encounter: Payer: Self-pay | Admitting: Endocrinology

## 2018-08-15 VITALS — BP 85/45 | HR 86 | Ht 69.0 in | Wt 299.0 lb

## 2018-08-15 DIAGNOSIS — E1122 Type 2 diabetes mellitus with diabetic chronic kidney disease: Secondary | ICD-10-CM

## 2018-08-15 DIAGNOSIS — E039 Hypothyroidism, unspecified: Secondary | ICD-10-CM

## 2018-08-15 DIAGNOSIS — N184 Chronic kidney disease, stage 4 (severe): Secondary | ICD-10-CM

## 2018-08-15 DIAGNOSIS — Z794 Long term (current) use of insulin: Secondary | ICD-10-CM | POA: Diagnosis not present

## 2018-08-15 DIAGNOSIS — G903 Multi-system degeneration of the autonomic nervous system: Secondary | ICD-10-CM | POA: Diagnosis not present

## 2018-08-15 DIAGNOSIS — E1165 Type 2 diabetes mellitus with hyperglycemia: Secondary | ICD-10-CM

## 2018-08-15 LAB — GLUCOSE, RANDOM: Glucose, Bld: 160 mg/dL — ABNORMAL HIGH (ref 70–99)

## 2018-08-15 LAB — T4, FREE: Free T4: 0.89 ng/dL (ref 0.60–1.60)

## 2018-08-15 LAB — TSH: TSH: 4.35 u[IU]/mL (ref 0.35–4.50)

## 2018-08-15 NOTE — Progress Notes (Signed)
Patient ID: Ethan Campbell, male   DOB: 1961/03/23, 58 y.o.   MRN: [id number]   Reason for Appointment: follow-up   History of Present Illness   Diagnosis: Type 2 DIABETES MELITUS, date of diagnosis: 2000    Previous history: he has been on insulin for several years with consistently poor control Has been requiring large doses of insulin for his diabetes but A1c has been persistently high Blood sugars did not improve significantly even with trying Byetta and Victoza In 2014 he was switched from NovoLog to U-500 insulin but not clear if he has had improvement in control except with fasting readings His prior A1c was 14.0 in 5/14 and he had educational discussions with diabetes educator and dietitian in 6/14   Recent history:  Insulin regimen:  Humulin R U-500 with Omnipod PUMP  Non-insulin hypoglycemic drugs: Victoza 1.8 mg daily   He was started on the OMNIPOD pumps on 02/06/2018  Current settings: Basal rate: Midnight =0.5,   5a.m. = 0.65, 12 noon = 0.9 and 9 PM = 0.8  Preset boluses: 6 units for lunch and 8 units dinner  A1c was recently 6.9 compared to 10.9   Current management, blood sugar patterns and problems identified:  Since he had his toe amputation he has not been bearing addition to his diabetes management as much  With this he has been bolusing infrequently and mostly only in the evening when the blood sugar goes up  However the last 3 to 4 days his blood sugars appear to be relatively lower throughout the day and he has woken up with readings of 40 and 48 this week around 7:30 AM  Otherwise he still has periodic hypoglycemia in the mornings and rarely in the afternoon  He is usually doing fairly well with trying to replace his pod and he needs to although recently he did not do so for a few hours  Frequently not entering blood sugars in his pump and mostly using his Accu-Chek  He has received the freestyle libre sensor but has not been able to  started on his own  Appears to have lost a little weight   Mealtimes: 12 pm, 6 pm  Proper timing of medications in relation to meals: Yes, usually 30 minutes before eating .          Monitors blood glucose:  2-  4 times a day.    Glucometer:  Accu-Chek  Aviva    Blood Glucose readings from download:    PRE-MEAL Fasting Lunch Dinner  overnight Overall  Glucose range:  40-187  70-278  114-346  53-226   Mean/median:  120  142  198  171  156+/-84   Around 10-11 PM: 96-361   Previous readings:  PRE-MEAL Fasting Lunch Dinner Bedtime Overall  Glucose range:  123-190  78-327  75-224    Mean/median:     162     Physical activity: exercise:none   Last dietitian visit: 6/18 and last CDE visit in 4/16  Wt Readings from Last 3 Encounters:  08/15/18 299 lb (135.6 kg)  08/09/18 (!) 307 lb (139.3 kg)  07/26/18 (!) 307 lb (139.3 kg)    LABS:   Lab Results  Component Value Date   HGBA1C 6.9 (A) 06/15/2018   HGBA1C 10.9 (A) 02/21/2018   HGBA1C 13.4 09/11/2017   Lab Results  Component Value Date   MICROALBUR 1.6 11/06/2017   LDLCALC 61 11/06/2017   CREATININE 2.00 (H) 06/15/2018    Lab  Results  Component Value Date   FRUCTOSAMINE 512 (H) 11/06/2017   FRUCTOSAMINE 494 (H) 12/22/2016   FRUCTOSAMINE 471 (H) 11/14/2013    OTHER active problems are discussed in review of systems   Allergies as of 08/15/2018   No Known Allergies     Medication List       Accurate as of August 15, 2018  3:16 PM. Always use your most recent med list.        amLODipine 10 MG tablet Commonly known as:  NORVASC Take 10 mg by mouth daily.   amoxicillin-clavulanate 500-125 MG tablet Commonly known as:  AUGMENTIN Take 1 tablet (500 mg total) by mouth 2 (two) times daily.   aspirin EC 81 MG tablet Take by mouth.   atorvastatin 80 MG tablet Commonly known as:  LIPITOR TAKE 1 TABLET DAILY.   benazepril 20 MG tablet Commonly known as:  LOTENSIN Take 20 mg by mouth daily.     doxycycline 100 MG tablet Commonly known as:  VIBRA-TABS Take 1 tablet (100 mg total) by mouth 2 (two) times daily.   doxycycline 100 MG tablet Commonly known as:  VIBRA-TABS Take 1 tablet (100 mg total) by mouth 2 (two) times daily.   DULoxetine 60 MG capsule Commonly known as:  CYMBALTA Take by mouth.   fenofibrate 145 MG tablet Commonly known as:  TRICOR Take 145 mg by mouth daily.   ferrous sulfate 325 (65 FE) MG tablet Take 325 mg by mouth 2 (two) times daily. TAKE 1 TABLET BY MOUTH TWICE DAILY.   FLUoxetine 20 MG capsule Commonly known as:  PROZAC Take 20 mg by mouth daily.   furosemide 80 MG tablet Commonly known as:  LASIX Take by mouth 2 (two) times daily. Takes one tablet in the morning and one-half tablet in the evening   gabapentin 300 MG capsule Commonly known as:  NEURONTIN Take by mouth.   glucose blood test strip Commonly known as:  ACCU-CHEK AVIVA PLUS TEST UP TO 4 TIMES DAILY Dx code E11.65   glucose blood test strip Commonly known as:  FREESTYLE LITE Check sugar 3 times daily   HUMULIN R 500 UNIT/ML injection Generic drug:  insulin regular human CONCENTRATED Inject into the skin. USE INSULIN TO FILL UP OMNIPOD EVERY 3 DAYS.   HYDROcodone-acetaminophen 5-325 MG tablet Commonly known as:  NORCO/VICODIN Take 1 tablet by mouth every 4 (four) hours as needed for moderate pain.   levothyroxine 100 MCG tablet Commonly known as:  SYNTHROID, LEVOTHROID Take 1 tablet daily before breakfast   liraglutide 18 MG/3ML Sopn Commonly known as:  VICTOZA USE 1.8MG  SUBCUTANEOUSLY DAILY.   meclizine 25 MG tablet Commonly known as:  ANTIVERT Take 25 mg by mouth 3 (three) times daily as needed for dizziness.   midodrine 5 MG tablet Commonly known as:  PROAMATINE Take by mouth.   OMNIPOD 5 PACK Misc 1 Package by Does not apply route every 3 (three) days.   potassium chloride 10 MEQ tablet Commonly known as:  K-DUR Take by mouth.   silver sulfADIAZINE  1 % cream Commonly known as:  SILVADENE Apply 1 application topically daily. Apply to right foot daily   tamsulosin 0.4 MG Caps capsule Commonly known as:  FLOMAX Take 0.4 mg by mouth.   tiZANidine 4 MG tablet Commonly known as:  ZANAFLEX Take 4 mg by mouth 2 (two) times daily.   warfarin 10 MG tablet Commonly known as:  COUMADIN Take by mouth. TAKE 1 TABLET BY MOUTH EVERY  DAY.       Allergies: No Known Allergies  Past Medical History:  Diagnosis Date  . Arm vein blood clot, left    on Coumadin  . Arthritis   . Cancer ([place])    Bone cancer 1987 (in knee Large cell tumor)  . Chronic kidney disease    stage 3  . Depression   . Essential hypertension   . History of stroke   . Hyperlipidemia   . Hypothyroidism   . Insomnia   . Obesity   . Precordial pain June 2011   Nuclear stress; no ischemia; EF 60%  . Presence of permanent cardiac pacemaker   . Proteinuria   . Seizure disorder ([place])   . Sick sinus syndrome ([place])   . Sleep apnea    Dr. Brandon Melnick, uses bipap  . Syncope   . Type 2 diabetes mellitus ([place])   . Venous insufficiency     Past Surgical History:  Procedure Laterality Date  . AMPUTATION Right 06/20/2018   Procedure: RIGHT GREAT TOE AMPUTATION;  Surgeon: Newt Minion, MD;  Location: [place];  Service: Orthopedics;  Laterality: Right;  . COLONOSCOPY    . RESECTION BONE TUMOR FEMUR  1980's   Left femur, treated at [facility] with bone graft  . TOTAL KNEE ARTHROPLASTY Left 2013    Family History  Problem Relation Age of Onset  . Heart attack Mother 29  . Breast cancer Mother   . Stroke Mother   . Heart attack Father 75  . Breast cancer Sister   . Colon cancer Sister     Social History:  reports that he has quit smoking. His smoking use included cigarettes. He has never used smokeless tobacco. He reports that he does not drink alcohol or use drugs.  Review of Systems:  Right big toe amputation: This resulted from osteomyelitis Still working with  arthritic surgeon for healing and wearing a boot  HYPOTHYROIDISM:   on  levothyroxine 100 ug daily The dose was increased in 11/19 when TSH was high on 88 mcg He feels fairly good overall   Lab Results  Component Value Date   TSH 5.92 (H) 06/15/2018   SYNCOPAL episodes:  Neurologist indicated that he may have vertebral artery flow issues  With midodrine he has not benefited much His lightheadedness is back again and he has had a few episodes where he has fallen getting lightheaded including in the bathroom early morning Continues to take antihypertensives including 10 mg Norvasc from his nephrologist who saw him yesterday He has also been recommended elastic stockings which he has not been able to obtain Apparently his blood pressure at home is about 976 systolic  Lipids: triglycerides on labs on the last measurement are below 200  Followed by PCP   Lab Results  Component Value Date   CHOL 126 11/06/2017   HDL 34.40 (L) 11/06/2017   LDLCALC 61 11/06/2017   LDLDIRECT 80.0 03/07/2016   TRIG 155.0 (H) 11/06/2017   CHOLHDL 4 11/06/2017      CKD: His creatinine has been In the 2-3 range, no reports available from nephrologist Creatinine recently has been as follows: His GFR reportedly was down to 28 from nephrologist   Lab Results  Component Value Date   CREATININE 2.00 (H) 06/15/2018   He has been told to have CHF In the past and is taking Lasix using 80 mg tablet    Examination:   BP (!) 110/56 (BP Location: Left Arm, Patient Position: Sitting, Cuff  Size: Large)   Pulse 86   Ht 5\' 9"  (1.753 m)   Wt 299 lb (135.6 kg)   SpO2 98%   BMI 44.15 kg/m   Body mass index is 44.15 kg/m.     ASSESSMENT/ PLAN:   Diabetes type 2 with obesity, insulin-dependent  See history of present illness for detailed discussion of current diabetes management, blood sugar patterns and problems identified  Currently on OMNIPOD insulin pump with U-500 insulin  His A1c is recently  better at 6.9, previously at least 10% persistently  He has checked his blood sugars on his Accu-Chek but not as often as before Also not clear why he is starting to get hypoglycemia frequently in the mornings now even with the basal rate of only 0.5 overnight  Total basal insulin recently is only about 16 units/day on the U-500 insulin However he still has high blood sugars when he does not bolus when he is supposed to  Currently he is wanting to start the freestyle libre which he has been able to get but does not know how to use it This will be started today  Emphasized the need to bolus for every meal and carbohydrate intake In the meantime he will reduce his basal rate at midnight down to 0.4 and at 9 PM down to 0.75 To call if he is continuing to have low blood sugars   ORTHOSTATIC hypotension: He has had orthostatic changes likely from diabetic autonomic neuropathy He is having a relatively low blood pressure partly from taking Lasix and also antihypertensive Currently sitting blood pressure is also low he will stop his amlodipine for now  Most likely he may benefit from droxidopa but unlikely this this would be approved for his indication   Hypothyroidism: Will need repeat thyroid level today with increasing his dose on the last visit    There are no Patient Instructions on file for this visit.        Elayne Snare 08/15/2018, 3:16 PM   Note: This office note was prepared with Dragon voice recognition system technology. Any transcriptional errors that result from this process are unintentional.

## 2018-08-15 NOTE — Patient Instructions (Addendum)
Stop Amlodipine 

## 2018-08-16 ENCOUNTER — Ambulatory Visit (INDEPENDENT_AMBULATORY_CARE_PROVIDER_SITE_OTHER): Payer: Medicaid Other | Admitting: Orthopedic Surgery

## 2018-08-16 ENCOUNTER — Encounter (INDEPENDENT_AMBULATORY_CARE_PROVIDER_SITE_OTHER): Payer: Self-pay | Admitting: Orthopedic Surgery

## 2018-08-16 VITALS — Ht 69.0 in | Wt 299.0 lb

## 2018-08-16 DIAGNOSIS — Z9889 Other specified postprocedural states: Secondary | ICD-10-CM

## 2018-08-16 DIAGNOSIS — Z89411 Acquired absence of right great toe: Secondary | ICD-10-CM

## 2018-08-16 DIAGNOSIS — Z792 Long term (current) use of antibiotics: Secondary | ICD-10-CM

## 2018-08-16 DIAGNOSIS — L97519 Non-pressure chronic ulcer of other part of right foot with unspecified severity: Secondary | ICD-10-CM

## 2018-08-16 LAB — FRUCTOSAMINE: Fructosamine: 250 umol/L (ref 0–285)

## 2018-08-16 MED ORDER — DOXYCYCLINE HYCLATE 100 MG PO TABS: 100.0000 mg | ORAL_TABLET | Freq: Two times a day (BID) | ORAL | 0 refills | Status: DC

## 2018-08-17 ENCOUNTER — Encounter (INDEPENDENT_AMBULATORY_CARE_PROVIDER_SITE_OTHER): Payer: Self-pay | Admitting: Orthopedic Surgery

## 2018-08-17 NOTE — Progress Notes (Signed)
Office Visit Note   Patient: Ethan Campbell           Date of Birth: 1960-08-19           MRN: [id number] Visit Date: 08/16/2018              Requested by: No referring provider defined for this encounter. PCP: Patient, No Pcp Per  Chief Complaint  Patient presents with  . Right Foot - Routine Post Op    06/20/18 right GT amputation       HPI: Patient is a 58 year old gentleman who is 2 months status post right great toe amputation.  Patient states he still has some redness and maceration and swelling.  Patient is weightbearing with crutches in a postoperative shoe.  Patient has ischemic changes on the lateral side of his foot.  Patient states he fell again in the bathroom.  Assessment & Plan: Visit Diagnoses:  1. Right great toe amputee ([place])     Plan: Recommended minimize weightbearing float the foot so there is no pressure on the lateral aspect of his foot prescription was called in for doxycycline due to the persistent ulceration over the great toe amputation site.  Follow-Up Instructions: Return in about 2 weeks (around 08/30/2018).   Ortho Exam  Patient is alert, oriented, no adenopathy, well-dressed, normal affect, normal respiratory effort. Examination patient has a large eschar over the lateral aspect of the right foot.  Patient states that he is gotten these a lot.  Ulcer is 3 x 5 cm with a hard eschar no cellulitis no drainage.  Examination the amputation site there is a small amount of clear drainage the redness has decreased the swelling has decreased.  Imaging: No results found. No images are attached to the encounter.  Labs: Lab Results  Component Value Date   HGBA1C 6.9 (A) 06/15/2018   HGBA1C 10.9 (A) 02/21/2018   HGBA1C 13.4 09/11/2017     Lab Results  Component Value Date   ALBUMIN 3.9 09/30/2015   ALBUMIN 4.1 09/03/2015   ALBUMIN 4.0 04/02/2014    Body mass index is 44.15 kg/m.  Orders:  No orders of the defined types were placed in  this encounter.  Meds ordered this encounter  Medications  . doxycycline (VIBRA-TABS) 100 MG tablet    Sig: Take 1 tablet (100 mg total) by mouth 2 (two) times daily.    Dispense:  60 tablet    Refill:  0     Procedures: No procedures performed  Clinical Data: No additional findings.  ROS:  All other systems negative, except as noted in the HPI. Review of Systems  Objective: Vital Signs: Ht 5\' 9"  (1.753 m)   Wt 299 lb (135.6 kg)   BMI 44.15 kg/m   Specialty Comments:  No specialty comments available.  PMFS History: Patient Active Problem List   Diagnosis Date Noted  . Osteomyelitis of great toe of right foot ([place]) 06/19/2018  . Diabetic polyneuropathy associated with type 2 diabetes mellitus ([place]) 06/19/2018  . Body mass index 45.0-49.9, adult ([place]) 06/19/2018  . Seizure disorder ([place]) 08/27/2015  . Somnolence, daytime 08/27/2015  . Orthostatic hypotension 11/27/2014  . HLD (hyperlipidemia) 11/27/2014  . Syncope and collapse 08/20/2014  . Diabetic autonomic neuropathy associated with type 2 diabetes mellitus ([place]) 05/01/2014  . Dizzy spells 03/17/2014  . Chronic kidney disease, stage III (moderate) ([place]) 08/22/2013  . Type II diabetes mellitus, uncontrolled ([place]) 12/03/2009  . HYPERLIPIDEMIA 12/03/2009  . Morbid obesity ([place]) 12/03/2009  .  OBSTRUCTIVE SLEEP APNEA 12/03/2009  . ESSENTIAL HYPERTENSION, BENIGN 12/03/2009  . EDEMA 12/03/2009  . PRECORDIAL PAIN 12/03/2009  . PROTEINURIA 12/03/2009   Past Medical History:  Diagnosis Date  . Arm vein blood clot, left    on Coumadin  . Arthritis   . Cancer ([place])    Bone cancer 1987 (in knee Large cell tumor)  . Chronic kidney disease    stage 3  . Depression   . Essential hypertension   . History of stroke   . Hyperlipidemia   . Hypothyroidism   . Insomnia   . Obesity   . Precordial pain June 2011   Nuclear stress; no ischemia; EF 60%  . Presence of permanent cardiac pacemaker   . Proteinuria   .  Seizure disorder ([place])   . Sick sinus syndrome ([place])   . Sleep apnea    Dr. Brandon Melnick, uses bipap  . Syncope   . Type 2 diabetes mellitus ([place])   . Venous insufficiency     Family History  Problem Relation Age of Onset  . Heart attack Mother 36  . Breast cancer Mother   . Stroke Mother   . Heart attack Father 95  . Breast cancer Sister   . Colon cancer Sister     Past Surgical History:  Procedure Laterality Date  . AMPUTATION Right 06/20/2018   Procedure: RIGHT GREAT TOE AMPUTATION;  Surgeon: Newt Minion, MD;  Location: [place];  Service: Orthopedics;  Laterality: Right;  . COLONOSCOPY    . RESECTION BONE TUMOR FEMUR  1980's   Left femur, treated at [facility] with bone graft  . TOTAL KNEE ARTHROPLASTY Left 2013   Social History   Occupational History  . Occupation: Disabled  Tobacco Use  . Smoking status: Former Smoker    Types: Cigarettes  . Smokeless tobacco: Never Used  . Tobacco comment: 11/27/14 "quit smoking years ago"  Substance and Sexual Activity  . Alcohol use: No    Alcohol/week: 0.0 standard drinks  . Drug use: No  . Sexual activity: Never

## 2018-08-21 ENCOUNTER — Other Ambulatory Visit: Payer: Self-pay | Admitting: Endocrinology

## 2018-08-23 ENCOUNTER — Ambulatory Visit (INDEPENDENT_AMBULATORY_CARE_PROVIDER_SITE_OTHER): Payer: Medicaid Other | Admitting: Orthopedic Surgery

## 2018-08-24 ENCOUNTER — Telehealth: Payer: Self-pay | Admitting: Endocrinology

## 2018-08-24 NOTE — Telephone Encounter (Signed)
[facility] is calling in regards to paperwork they have faxed over to our office (CMN) for the patients DM shoes. They would like a call back to give an update or have them refax the information   PHONE- [phone]

## 2018-08-27 NOTE — Telephone Encounter (Signed)
Called [facility] and they will be refaxing this paperwork.

## 2018-08-30 ENCOUNTER — Ambulatory Visit (INDEPENDENT_AMBULATORY_CARE_PROVIDER_SITE_OTHER): Payer: Medicaid Other | Admitting: Physician Assistant

## 2018-09-06 ENCOUNTER — Ambulatory Visit (INDEPENDENT_AMBULATORY_CARE_PROVIDER_SITE_OTHER): Payer: Medicaid Other | Admitting: Physician Assistant

## 2018-09-10 ENCOUNTER — Ambulatory Visit (INDEPENDENT_AMBULATORY_CARE_PROVIDER_SITE_OTHER): Payer: Medicaid Other | Admitting: Physician Assistant

## 2018-09-10 ENCOUNTER — Encounter (INDEPENDENT_AMBULATORY_CARE_PROVIDER_SITE_OTHER): Payer: Self-pay | Admitting: Physician Assistant

## 2018-09-10 VITALS — Ht 69.0 in | Wt 299.0 lb

## 2018-09-10 DIAGNOSIS — E1142 Type 2 diabetes mellitus with diabetic polyneuropathy: Secondary | ICD-10-CM

## 2018-09-10 DIAGNOSIS — I87323 Chronic venous hypertension (idiopathic) with inflammation of bilateral lower extremity: Secondary | ICD-10-CM

## 2018-09-10 DIAGNOSIS — Z794 Long term (current) use of insulin: Secondary | ICD-10-CM

## 2018-09-10 DIAGNOSIS — Z89411 Acquired absence of right great toe: Secondary | ICD-10-CM

## 2018-09-10 DIAGNOSIS — Z6841 Body Mass Index (BMI) 40.0 and over, adult: Secondary | ICD-10-CM

## 2018-09-10 MED ORDER — SULFAMETHOXAZOLE-TRIMETHOPRIM 800-160 MG PO TABS: 1.0000 | ORAL_TABLET | Freq: Two times a day (BID) | ORAL | 0 refills | Status: DC

## 2018-09-10 MED ORDER — SILVER SULFADIAZINE 1 % EX CREA: 1.0000 "application " | TOPICAL_CREAM | Freq: Every day | CUTANEOUS | 0 refills | Status: DC

## 2018-09-10 NOTE — Progress Notes (Signed)
Office Visit Note   Patient: Ethan Campbell           Date of Birth: [birth year]           MRN: [id number] Visit Date: 09/10/2018              Requested by: No referring provider defined for this encounter. PCP: Patient, No Pcp Per  Chief Complaint  Patient presents with  . Right Foot - Follow-up      HPI: The patient is a 59 year old male who is seen for follow-up of a right great toe amputation on 06/20/2018.  He is on doxycycline 100 mg twice daily and trying to minimize his weightbearing as much as possible.  He reports moderate drainage and some bleeding with the drainage.  He does continue to have swelling of the foot.  He is not having pain.  Assessment & Plan: Visit Diagnoses:  1. Right great toe amputee ([place])   2. Type 2 diabetes mellitus with diabetic polyneuropathy, with long-term current use of insulin ([place])   3. Chronic venous hypertension (idiopathic) with inflammation of bilateral lower extremity   4. Body mass index 45.0-49.9, adult ([place])     Plan: We will begin Silvadene dressing changes to the wound bed.  His antibiotic was switched to Septra DS 1 p.o. twice daily.  Recommended continued minimization of weightbearing as much as possible and elevation of the foot is much as possible  Follow-Up Instructions: Return in about 2 weeks (around 09/24/2018).   Ortho Exam  Patient is alert, oriented, no adenopathy, well-dressed, normal affect, normal respiratory effort. The right great toe amputation site has some minimal serous sanguinous drainage with periwound erythema and some erythema residually into the midfoot.  There is a large dark eschar over the lateral foot which is still 3.5 cm with scant drainage from the wound borders.  He has palpable pedal pulses.  Imaging: No results found.    Labs: Lab Results  Component Value Date   HGBA1C 6.9 (A) 06/15/2018   HGBA1C 10.9 (A) 02/21/2018   HGBA1C 13.4 09/11/2017     Lab Results  Component Value Date   ALBUMIN 3.9 09/30/2015   ALBUMIN 4.1 09/03/2015   ALBUMIN 4.0 04/02/2014    Body mass index is 44.15 kg/m.  Orders:  No orders of the defined types were placed in this encounter.  Meds ordered this encounter  Medications  . silver sulfADIAZINE (SILVADENE) 1 % cream    Sig: Apply 1 application topically daily. Apply to right foot daily    Dispense:  50 g    Refill:  0  . sulfamethoxazole-trimethoprim (BACTRIM DS,SEPTRA DS) 800-160 MG tablet    Sig: Take 1 tablet by mouth 2 (two) times daily.    Dispense:  20 tablet    Refill:  0     Procedures: No procedures performed  Clinical Data: No additional findings.  ROS:  All other systems negative, except as noted in the HPI. Review of Systems  Objective: Vital Signs: Ht 5\' 9"  (1.753 m)   Wt 299 lb (135.6 kg)   BMI 44.15 kg/m   Specialty Comments:  No specialty comments available.  PMFS History: Patient Active Problem List   Diagnosis Date Noted  . Osteomyelitis of great toe of right foot ([place]) 06/19/2018  . Diabetic polyneuropathy associated with type 2 diabetes mellitus ([place]) 06/19/2018  . Body mass index 45.0-49.9, adult ([place]) 06/19/2018  . Seizure disorder ([place]) 08/27/2015  . Somnolence, daytime 08/27/2015  .  Orthostatic hypotension 11/27/2014  . HLD (hyperlipidemia) 11/27/2014  . Syncope and collapse 08/20/2014  . Diabetic autonomic neuropathy associated with type 2 diabetes mellitus ([place]) 05/01/2014  . Dizzy spells 03/17/2014  . Chronic kidney disease, stage III (moderate) ([place]) 08/22/2013  . Type II diabetes mellitus, uncontrolled ([place]) 12/03/2009  . HYPERLIPIDEMIA 12/03/2009  . Morbid obesity ([place]) 12/03/2009  . OBSTRUCTIVE SLEEP APNEA 12/03/2009  . ESSENTIAL HYPERTENSION, BENIGN 12/03/2009  . EDEMA 12/03/2009  . PRECORDIAL PAIN 12/03/2009  . PROTEINURIA 12/03/2009   Past Medical History:  Diagnosis Date  . Arm vein blood clot, left    on Coumadin  . Arthritis   . Cancer ([place])    Bone cancer  1987 (in knee Large cell tumor)  . Chronic kidney disease    stage 3  . Depression   . Essential hypertension   . History of stroke   . Hyperlipidemia   . Hypothyroidism   . Insomnia   . Obesity   . Precordial pain June 2011   Nuclear stress; no ischemia; EF 60%  . Presence of permanent cardiac pacemaker   . Proteinuria   . Seizure disorder ([place])   . Sick sinus syndrome ([place])   . Sleep apnea    Dr. Brandon Melnick, uses bipap  . Syncope   . Type 2 diabetes mellitus ([place])   . Venous insufficiency     Family History  Problem Relation Age of Onset  . Heart attack Mother 1  . Breast cancer Mother   . Stroke Mother   . Heart attack Father 37  . Breast cancer Sister   . Colon cancer Sister     Past Surgical History:  Procedure Laterality Date  . AMPUTATION Right 06/20/2018   Procedure: RIGHT GREAT TOE AMPUTATION;  Surgeon: Newt Minion, MD;  Location: [place];  Service: Orthopedics;  Laterality: Right;  . COLONOSCOPY    . RESECTION BONE TUMOR FEMUR  1980's   Left femur, [facility] at [facility] with bone graft  . TOTAL KNEE ARTHROPLASTY Left 2013   Social History   Occupational History  . Occupation: Disabled  Tobacco Use  . Smoking status: Former Smoker    Types: Cigarettes  . Smokeless tobacco: Never Used  . Tobacco comment: 11/27/14 "quit smoking years ago"  Substance and Sexual Activity  . Alcohol use: No    Alcohol/week: 0.0 standard drinks  . Drug use: No  . Sexual activity: Never

## 2018-09-12 ENCOUNTER — Encounter (INDEPENDENT_AMBULATORY_CARE_PROVIDER_SITE_OTHER): Payer: Self-pay | Admitting: Physician Assistant

## 2018-09-24 ENCOUNTER — Ambulatory Visit (INDEPENDENT_AMBULATORY_CARE_PROVIDER_SITE_OTHER): Payer: Self-pay | Admitting: Physician Assistant

## 2018-09-24 ENCOUNTER — Ambulatory Visit (INDEPENDENT_AMBULATORY_CARE_PROVIDER_SITE_OTHER): Payer: Medicaid Other | Admitting: Physician Assistant

## 2018-09-24 ENCOUNTER — Encounter (INDEPENDENT_AMBULATORY_CARE_PROVIDER_SITE_OTHER): Payer: Self-pay | Admitting: Physician Assistant

## 2018-09-24 VITALS — Ht 69.0 in | Wt 299.0 lb

## 2018-09-24 DIAGNOSIS — L97513 Non-pressure chronic ulcer of other part of right foot with necrosis of muscle: Secondary | ICD-10-CM

## 2018-09-24 DIAGNOSIS — Z794 Long term (current) use of insulin: Secondary | ICD-10-CM

## 2018-09-24 DIAGNOSIS — E1142 Type 2 diabetes mellitus with diabetic polyneuropathy: Secondary | ICD-10-CM

## 2018-09-24 DIAGNOSIS — Z89411 Acquired absence of right great toe: Secondary | ICD-10-CM

## 2018-09-24 DIAGNOSIS — I87323 Chronic venous hypertension (idiopathic) with inflammation of bilateral lower extremity: Secondary | ICD-10-CM

## 2018-09-24 NOTE — Progress Notes (Signed)
Office Visit Note   Patient: Ethan Campbell           Date of Birth: 13-Dec-1960           MRN: [id number] Visit Date: 09/24/2018              Requested by: No referring provider defined for this encounter. PCP: Patient, No Pcp Per  Chief Complaint  Patient presents with  . Right Foot - Follow-up    06/20/18 Right great toe amp      HPI: The patient is a 58 year old gentleman who is seen for postoperative follow-up following a right great toe amputation on 06/20/2018 and development of a new ulcer over the lateral right foot.  He has been on Silvadene dressing changes for the past couple of weeks and Septra DS 1 p.o. twice daily.  He has had serosanguineous drainage from the area and some odor.  He has developed dark adherent eschar over the lateral foot wound.  Assessment & Plan: Visit Diagnoses:  1. Right great toe amputee ([place])   2. Foot ulcer with necrosis of muscle, right ([place])   3. Type 2 diabetes mellitus with diabetic polyneuropathy, with long-term current use of insulin ([place])   4. Chronic venous hypertension (idiopathic) with inflammation of bilateral lower extremity     Plan: The dark eschar over the lateral foot was debrided with a #10 blade knife but does track to the bone.  Dr. Sharol Given discussed with the patient further surgery with amputation of the right fifth ray in order to heal the area and the patient is in agreement.  We will plan for surgery later this week and he will follow-up next week.  Follow-Up Instructions: Return in about 10 days (around 10/04/2018).   Ortho Exam  Patient is alert, oriented, no adenopathy, well-dressed, normal affect, normal respiratory effort. The right great toe amputation site has nearly healed with a small residual 1 cm area of hyper granulation and scant serous drainage.  There is edema of the foot.  The lateral foot has a large dark adherent eschar present and this was debrided with a #10 blade knife to bleeding tissue however  all the muscle was necrotic and there is no bone exposed.  We will plan for amputation of the right fifth ray.  Imaging: No results found. No images are attached to the encounter.  Labs: Lab Results  Component Value Date   HGBA1C 6.9 (A) 06/15/2018   HGBA1C 10.9 (A) 02/21/2018   HGBA1C 13.4 09/11/2017     Lab Results  Component Value Date   ALBUMIN 3.9 09/30/2015   ALBUMIN 4.1 09/03/2015   ALBUMIN 4.0 04/02/2014    Body mass index is 44.15 kg/m.  Orders:  No orders of the defined types were placed in this encounter.  No orders of the defined types were placed in this encounter.    Procedures: No procedures performed  Clinical Data: No additional findings.  ROS:  All other systems negative, except as noted in the HPI. Review of Systems  Objective: Vital Signs: Ht 5\' 9"  (1.753 m)   Wt 299 lb (135.6 kg)   BMI 44.15 kg/m   Specialty Comments:  No specialty comments available.  PMFS History: Patient Active Problem List   Diagnosis Date Noted  . Osteomyelitis of great toe of right foot ([place]) 06/19/2018  . Diabetic polyneuropathy associated with type 2 diabetes mellitus ([place]) 06/19/2018  . Body mass index 45.0-49.9, adult ([place]) 06/19/2018  . Seizure disorder ([place])  08/27/2015  . Somnolence, daytime 08/27/2015  . Orthostatic hypotension 11/27/2014  . HLD (hyperlipidemia) 11/27/2014  . Syncope and collapse 08/20/2014  . Diabetic autonomic neuropathy associated with type 2 diabetes mellitus ([place]) 05/01/2014  . Dizzy spells 03/17/2014  . Chronic kidney disease, stage III (moderate) ([place]) 08/22/2013  . Type II diabetes mellitus, uncontrolled ([place]) 12/03/2009  . HYPERLIPIDEMIA 12/03/2009  . Morbid obesity ([place]) 12/03/2009  . OBSTRUCTIVE SLEEP APNEA 12/03/2009  . ESSENTIAL HYPERTENSION, BENIGN 12/03/2009  . EDEMA 12/03/2009  . PRECORDIAL PAIN 12/03/2009  . PROTEINURIA 12/03/2009   Past Medical History:  Diagnosis Date  . Arm vein blood clot, left    on  Coumadin  . Arthritis   . Cancer ([place])    Bone cancer 1987 (in knee Large cell tumor)  . Chronic kidney disease    stage 3  . Depression   . Essential hypertension   . History of stroke   . Hyperlipidemia   . Hypothyroidism   . Insomnia   . Obesity   . Precordial pain June 2011   Nuclear stress; no ischemia; EF 60%  . Presence of permanent cardiac pacemaker   . Proteinuria   . Seizure disorder ([place])   . Sick sinus syndrome ([place])   . Sleep apnea    Dr. Brandon Melnick, uses bipap  . Syncope   . Type 2 diabetes mellitus ([place])   . Venous insufficiency     Family History  Problem Relation Age of Onset  . Heart attack Mother 52  . Breast cancer Mother   . Stroke Mother   . Heart attack Father 50  . Breast cancer Sister   . Colon cancer Sister     Past Surgical History:  Procedure Laterality Date  . AMPUTATION Right 06/20/2018   Procedure: RIGHT GREAT TOE AMPUTATION;  Surgeon: Newt Minion, MD;  Location: [place];  Service: Orthopedics;  Laterality: Right;  . COLONOSCOPY    . RESECTION BONE TUMOR FEMUR  1980's   Left femur, treated at [facility] with bone graft  . TOTAL KNEE ARTHROPLASTY Left 2013   Social History   Occupational History  . Occupation: Disabled  Tobacco Use  . Smoking status: Former Smoker    Types: Cigarettes  . Smokeless tobacco: Never Used  . Tobacco comment: 11/27/14 "quit smoking years ago"  Substance and Sexual Activity  . Alcohol use: No    Alcohol/week: 0.0 standard drinks  . Drug use: No  . Sexual activity: Never

## 2018-09-25 ENCOUNTER — Encounter (HOSPITAL_COMMUNITY): Payer: Self-pay | Admitting: *Deleted

## 2018-09-25 ENCOUNTER — Other Ambulatory Visit: Payer: Self-pay

## 2018-09-25 ENCOUNTER — Telehealth (INDEPENDENT_AMBULATORY_CARE_PROVIDER_SITE_OTHER): Payer: Self-pay | Admitting: Orthopedic Surgery

## 2018-09-25 MED ORDER — DEXTROSE 5 % IV SOLN
3.0000 g | INTRAVENOUS | Status: AC
  Administered 2018-09-26: 3 g via INTRAVENOUS
  Filled 2018-09-25: qty 3

## 2018-09-25 NOTE — Telephone Encounter (Signed)
Pt called asking if he can get a rx for a medical knee board.

## 2018-09-25 NOTE — Progress Notes (Signed)
Spoke with pt for pre-op call. Pt has hx of Sick Sinus Syndrome and has a pacemaker. Pt denies any other cardiac history. Pt sees Phineas Inches Ritsche, NP for cardiology. Pt is on Coumadin but states Dr. Sharol Given did not instruct him to stop it before surgery.  Pt is a type 2 diabetic. Last A1C was 6.9 on 06/15/18. Pt has an Omnipod Insulin Pump and usues Humulin R 500 units/ml. Instructed pt to call Dr. Ronnie Derby office and ask him if pt needs to reduce the basal rate at midnight tonight for surgery tomorrow. Instructed him to follow whatever instructions Dr. Dwyane Dee gives him. Pt has a device that he uses that stays on his arm and it constantly checks his blood sugar. He states his fasting blood sugar is usually between 140-150. Did instruct him to to watch is blood sugar in the AM and if his blood sugar drops below 70 to treat it with a 1/2 cup of clear juice (apple or cranberry) and recheck his blood sugar 15 minutes after drinking the juice. Pt voiced understanding.  Notified Lauren, Diabetes Coordinator of pt's surgery time, type of surgery and his arrival time.

## 2018-09-26 ENCOUNTER — Other Ambulatory Visit: Payer: Self-pay

## 2018-09-26 ENCOUNTER — Ambulatory Visit (HOSPITAL_COMMUNITY): Payer: Medicaid Other | Admitting: Certified Registered Nurse Anesthetist

## 2018-09-26 ENCOUNTER — Encounter (HOSPITAL_COMMUNITY): Payer: Self-pay | Admitting: Anesthesiology

## 2018-09-26 ENCOUNTER — Encounter (HOSPITAL_COMMUNITY)
Admission: RE | Disposition: A | Discharge status: Home / Self Care | Payer: Self-pay | Source: Ambulatory Visit | Attending: Orthopedic Surgery

## 2018-09-26 ENCOUNTER — Observation Stay (HOSPITAL_COMMUNITY)
Admission: RE | Admit: 2018-09-26 | Discharge: 2018-09-27 | Disposition: A | Discharge status: Home / Self Care | Payer: Medicaid Other | Source: Ambulatory Visit | Attending: Orthopedic Surgery | Admitting: Orthopedic Surgery

## 2018-09-26 DIAGNOSIS — Z6841 Body Mass Index (BMI) 40.0 and over, adult: Secondary | ICD-10-CM | POA: Diagnosis not present

## 2018-09-26 DIAGNOSIS — E785 Hyperlipidemia, unspecified: Secondary | ICD-10-CM | POA: Diagnosis not present

## 2018-09-26 DIAGNOSIS — Z85830 Personal history of malignant neoplasm of bone: Secondary | ICD-10-CM | POA: Diagnosis not present

## 2018-09-26 DIAGNOSIS — Z79899 Other long term (current) drug therapy: Secondary | ICD-10-CM | POA: Insufficient documentation

## 2018-09-26 DIAGNOSIS — I129 Hypertensive chronic kidney disease with stage 1 through stage 4 chronic kidney disease, or unspecified chronic kidney disease: Secondary | ICD-10-CM | POA: Insufficient documentation

## 2018-09-26 DIAGNOSIS — I495 Sick sinus syndrome: Secondary | ICD-10-CM | POA: Insufficient documentation

## 2018-09-26 DIAGNOSIS — Z96652 Presence of left artificial knee joint: Secondary | ICD-10-CM | POA: Insufficient documentation

## 2018-09-26 DIAGNOSIS — Z89411 Acquired absence of right great toe: Secondary | ICD-10-CM | POA: Insufficient documentation

## 2018-09-26 DIAGNOSIS — E11621 Type 2 diabetes mellitus with foot ulcer: Secondary | ICD-10-CM | POA: Diagnosis not present

## 2018-09-26 DIAGNOSIS — E039 Hypothyroidism, unspecified: Secondary | ICD-10-CM | POA: Insufficient documentation

## 2018-09-26 DIAGNOSIS — L02611 Cutaneous abscess of right foot: Secondary | ICD-10-CM | POA: Diagnosis not present

## 2018-09-26 DIAGNOSIS — E1122 Type 2 diabetes mellitus with diabetic chronic kidney disease: Secondary | ICD-10-CM | POA: Diagnosis not present

## 2018-09-26 DIAGNOSIS — F329 Major depressive disorder, single episode, unspecified: Secondary | ICD-10-CM | POA: Insufficient documentation

## 2018-09-26 DIAGNOSIS — D649 Anemia, unspecified: Secondary | ICD-10-CM | POA: Insufficient documentation

## 2018-09-26 DIAGNOSIS — Z8249 Family history of ischemic heart disease and other diseases of the circulatory system: Secondary | ICD-10-CM | POA: Insufficient documentation

## 2018-09-26 DIAGNOSIS — G4700 Insomnia, unspecified: Secondary | ICD-10-CM | POA: Diagnosis not present

## 2018-09-26 DIAGNOSIS — G4730 Sleep apnea, unspecified: Secondary | ICD-10-CM | POA: Diagnosis not present

## 2018-09-26 DIAGNOSIS — M1990 Unspecified osteoarthritis, unspecified site: Secondary | ICD-10-CM | POA: Insufficient documentation

## 2018-09-26 DIAGNOSIS — G40909 Epilepsy, unspecified, not intractable, without status epilepticus: Secondary | ICD-10-CM | POA: Diagnosis not present

## 2018-09-26 DIAGNOSIS — E669 Obesity, unspecified: Secondary | ICD-10-CM | POA: Diagnosis not present

## 2018-09-26 DIAGNOSIS — I872 Venous insufficiency (chronic) (peripheral): Secondary | ICD-10-CM | POA: Diagnosis not present

## 2018-09-26 DIAGNOSIS — M86271 Subacute osteomyelitis, right ankle and foot: Secondary | ICD-10-CM | POA: Diagnosis not present

## 2018-09-26 DIAGNOSIS — Z87891 Personal history of nicotine dependence: Secondary | ICD-10-CM | POA: Insufficient documentation

## 2018-09-26 DIAGNOSIS — L97519 Non-pressure chronic ulcer of other part of right foot with unspecified severity: Secondary | ICD-10-CM | POA: Insufficient documentation

## 2018-09-26 DIAGNOSIS — M869 Osteomyelitis, unspecified: Secondary | ICD-10-CM | POA: Diagnosis present

## 2018-09-26 DIAGNOSIS — Z8673 Personal history of transient ischemic attack (TIA), and cerebral infarction without residual deficits: Secondary | ICD-10-CM | POA: Diagnosis not present

## 2018-09-26 DIAGNOSIS — Z7982 Long term (current) use of aspirin: Secondary | ICD-10-CM | POA: Insufficient documentation

## 2018-09-26 DIAGNOSIS — Z950 Presence of cardiac pacemaker: Secondary | ICD-10-CM | POA: Insufficient documentation

## 2018-09-26 DIAGNOSIS — Z800 Family history of malignant neoplasm of digestive organs: Secondary | ICD-10-CM | POA: Insufficient documentation

## 2018-09-26 DIAGNOSIS — E1142 Type 2 diabetes mellitus with diabetic polyneuropathy: Secondary | ICD-10-CM | POA: Diagnosis not present

## 2018-09-26 DIAGNOSIS — Z823 Family history of stroke: Secondary | ICD-10-CM | POA: Insufficient documentation

## 2018-09-26 DIAGNOSIS — S98131A Complete traumatic amputation of one right lesser toe, initial encounter: Secondary | ICD-10-CM | POA: Diagnosis present

## 2018-09-26 DIAGNOSIS — Z803 Family history of malignant neoplasm of breast: Secondary | ICD-10-CM | POA: Insufficient documentation

## 2018-09-26 DIAGNOSIS — M868X7 Other osteomyelitis, ankle and foot: Secondary | ICD-10-CM | POA: Insufficient documentation

## 2018-09-26 DIAGNOSIS — N189 Chronic kidney disease, unspecified: Secondary | ICD-10-CM | POA: Diagnosis not present

## 2018-09-26 DIAGNOSIS — Z794 Long term (current) use of insulin: Secondary | ICD-10-CM | POA: Insufficient documentation

## 2018-09-26 HISTORY — PX: AMPUTATION: SHX166

## 2018-09-26 LAB — PROTIME-INR
INR: 2.3 — ABNORMAL HIGH (ref 0.8–1.2)
Prothrombin Time: 25 seconds — ABNORMAL HIGH (ref 11.4–15.2)

## 2018-09-26 LAB — BASIC METABOLIC PANEL
Anion gap: 8 (ref 5–15)
BUN: 25 mg/dL — ABNORMAL HIGH (ref 6–20)
CO2: 25 mmol/L (ref 22–32)
Calcium: 8.7 mg/dL — ABNORMAL LOW (ref 8.9–10.3)
Chloride: 105 mmol/L (ref 98–111)
Creatinine, Ser: 2.36 mg/dL — ABNORMAL HIGH (ref 0.61–1.24)
GFR calc Af Amer: 34 mL/min — ABNORMAL LOW (ref >60)
GFR calc non Af Amer: 29 mL/min — ABNORMAL LOW (ref >60)
Glucose, Bld: 140 mg/dL — ABNORMAL HIGH (ref 70–99)
Potassium: 4 mmol/L (ref 3.5–5.1)
Sodium: 138 mmol/L (ref 135–145)

## 2018-09-26 LAB — CBC
HCT: 33.4 % — ABNORMAL LOW (ref 39.0–52.0)
Hemoglobin: 10 g/dL — ABNORMAL LOW (ref 13.0–17.0)
MCH: 27.2 pg (ref 26.0–34.0)
MCHC: 29.9 g/dL — ABNORMAL LOW (ref 30.0–36.0)
MCV: 90.8 fL (ref 80.0–100.0)
Platelets: 442 10*3/uL — ABNORMAL HIGH (ref 150–400)
RBC: 3.68 MIL/uL — ABNORMAL LOW (ref 4.22–5.81)
RDW: 15.2 % (ref 11.5–15.5)
WBC: 7.9 10*3/uL (ref 4.0–10.5)
nRBC: 0 % (ref 0.0–0.2)

## 2018-09-26 LAB — GLUCOSE, CAPILLARY
Glucose-Capillary: 131 mg/dL — ABNORMAL HIGH (ref 70–99)
Glucose-Capillary: 177 mg/dL — ABNORMAL HIGH (ref 70–99)
Glucose-Capillary: 176 mg/dL — ABNORMAL HIGH (ref 70–99)
Glucose-Capillary: 236 mg/dL — ABNORMAL HIGH (ref 70–99)
Glucose-Capillary: 160 mg/dL — ABNORMAL HIGH (ref 70–99)

## 2018-09-26 SURGERY — AMPUTATION, FOOT, RAY
Anesthesia: Monitor Anesthesia Care | Site: Foot | Laterality: Right

## 2018-09-26 MED ORDER — LEVOTHYROXINE SODIUM 100 MCG PO TABS
100.0000 ug | ORAL_TABLET | Freq: Every day | ORAL | Status: DC
  Administered 2018-09-27: 100 ug via ORAL
  Filled 2018-09-26 (×2): qty 1

## 2018-09-26 MED ORDER — MAGNESIUM CITRATE PO SOLN: 1.0000 | Freq: Once | ORAL | Status: DC | PRN

## 2018-09-26 MED ORDER — ONDANSETRON HCL 4 MG/2ML IJ SOLN: 4.0000 mg | Freq: Four times a day (QID) | INTRAMUSCULAR | Status: DC | PRN

## 2018-09-26 MED ORDER — LIDOCAINE 2% (20 MG/ML) 5 ML SYRINGE
INTRAMUSCULAR | Status: DC | PRN
  Administered 2018-09-26: 20 mg via INTRAVENOUS

## 2018-09-26 MED ORDER — BUPIVACAINE-EPINEPHRINE (PF) 0.5% -1:200000 IJ SOLN
INTRAMUSCULAR | Status: DC | PRN
  Administered 2018-09-26: 15 mL via PERINEURAL
  Administered 2018-09-26: 25 mL via PERINEURAL

## 2018-09-26 MED ORDER — MECLIZINE HCL 25 MG PO TABS
25.0000 mg | ORAL_TABLET | Freq: Three times a day (TID) | ORAL | Status: DC | PRN
  Administered 2018-09-27: 25 mg via ORAL
  Filled 2018-09-26 (×2): qty 1

## 2018-09-26 MED ORDER — FENTANYL CITRATE (PF) 100 MCG/2ML IJ SOLN: 25.0000 ug | INTRAMUSCULAR | Status: DC | PRN

## 2018-09-26 MED ORDER — SODIUM CHLORIDE 0.9 % IV SOLN
INTRAVENOUS | Status: DC
  Administered 2018-09-26: 15:00:00 via INTRAVENOUS

## 2018-09-26 MED ORDER — TAMSULOSIN HCL 0.4 MG PO CAPS
0.4000 mg | ORAL_CAPSULE | Freq: Every day | ORAL | Status: DC
  Administered 2018-09-27: 0.4 mg via ORAL
  Filled 2018-09-26: qty 1

## 2018-09-26 MED ORDER — INSULIN GLARGINE 100 UNIT/ML ~~LOC~~ SOLN
40.0000 [IU] | Freq: Every day | SUBCUTANEOUS | Status: DC
  Administered 2018-09-26: 40 [IU] via SUBCUTANEOUS
  Filled 2018-09-26 (×2): qty 0.4

## 2018-09-26 MED ORDER — FUROSEMIDE 40 MG PO TABS
40.0000 mg | ORAL_TABLET | Freq: Every evening | ORAL | Status: DC
  Administered 2018-09-26: 40 mg via ORAL
  Filled 2018-09-26: qty 1

## 2018-09-26 MED ORDER — PHENYLEPHRINE 40 MCG/ML (10ML) SYRINGE FOR IV PUSH (FOR BLOOD PRESSURE SUPPORT)
PREFILLED_SYRINGE | INTRAVENOUS | Status: DC | PRN
  Administered 2018-09-26 (×2): 80 ug via INTRAVENOUS
  Administered 2018-09-26: 120 ug via INTRAVENOUS
  Administered 2018-09-26 (×5): 80 ug via INTRAVENOUS

## 2018-09-26 MED ORDER — BENAZEPRIL HCL 20 MG PO TABS
20.0000 mg | ORAL_TABLET | Freq: Every day | ORAL | Status: DC
  Administered 2018-09-27: 20 mg via ORAL
  Filled 2018-09-26: qty 1

## 2018-09-26 MED ORDER — DOCUSATE SODIUM 100 MG PO CAPS
100.0000 mg | ORAL_CAPSULE | Freq: Two times a day (BID) | ORAL | Status: DC
  Administered 2018-09-26 – 2018-09-27 (×3): 100 mg via ORAL
  Filled 2018-09-26 (×3): qty 1

## 2018-09-26 MED ORDER — WARFARIN SODIUM 7.5 MG PO TABS
7.5000 mg | ORAL_TABLET | Freq: Once | ORAL | Status: AC
  Administered 2018-09-26: 7.5 mg via ORAL
  Filled 2018-09-26: qty 1

## 2018-09-26 MED ORDER — CHLORHEXIDINE GLUCONATE 4 % EX LIQD: 60.0000 mL | Freq: Once | CUTANEOUS | Status: DC

## 2018-09-26 MED ORDER — MIDAZOLAM HCL 5 MG/5ML IJ SOLN
INTRAMUSCULAR | Status: DC | PRN
  Administered 2018-09-26: 2 mg via INTRAVENOUS

## 2018-09-26 MED ORDER — PROPOFOL 500 MG/50ML IV EMUL
INTRAVENOUS | Status: DC | PRN
  Administered 2018-09-26: 75 ug/kg/min via INTRAVENOUS

## 2018-09-26 MED ORDER — METHOCARBAMOL 500 MG PO TABS
500.0000 mg | ORAL_TABLET | Freq: Four times a day (QID) | ORAL | Status: DC | PRN
  Filled 2018-09-26: qty 1

## 2018-09-26 MED ORDER — EPHEDRINE SULFATE-NACL 50-0.9 MG/10ML-% IV SOSY
PREFILLED_SYRINGE | INTRAVENOUS | Status: DC | PRN
  Administered 2018-09-26 (×2): 10 mg via INTRAVENOUS
  Administered 2018-09-26: 5 mg via INTRAVENOUS

## 2018-09-26 MED ORDER — INSULIN ASPART 100 UNIT/ML ~~LOC~~ SOLN
0.0000 [IU] | Freq: Three times a day (TID) | SUBCUTANEOUS | Status: DC
  Administered 2018-09-26: 5 [IU] via SUBCUTANEOUS
  Administered 2018-09-27: 3 [IU] via SUBCUTANEOUS
  Administered 2018-09-27: 8 [IU] via SUBCUTANEOUS

## 2018-09-26 MED ORDER — MIDODRINE HCL 5 MG PO TABS
5.0000 mg | ORAL_TABLET | Freq: Three times a day (TID) | ORAL | Status: DC
  Administered 2018-09-26 – 2018-09-27 (×3): 5 mg via ORAL
  Filled 2018-09-26 (×3): qty 1

## 2018-09-26 MED ORDER — ATORVASTATIN CALCIUM 80 MG PO TABS
80.0000 mg | ORAL_TABLET | Freq: Every day | ORAL | Status: DC
  Administered 2018-09-26 – 2018-09-27 (×2): 80 mg via ORAL
  Filled 2018-09-26 (×2): qty 1

## 2018-09-26 MED ORDER — CEFAZOLIN SODIUM-DEXTROSE 2-4 GM/100ML-% IV SOLN
2.0000 g | Freq: Four times a day (QID) | INTRAVENOUS | Status: AC
  Administered 2018-09-26 – 2018-09-27 (×3): 2 g via INTRAVENOUS
  Filled 2018-09-26 (×3): qty 100

## 2018-09-26 MED ORDER — MEPERIDINE HCL 50 MG/ML IJ SOLN: 6.2500 mg | INTRAMUSCULAR | Status: DC | PRN

## 2018-09-26 MED ORDER — DEXAMETHASONE SODIUM PHOSPHATE 10 MG/ML IJ SOLN
INTRAMUSCULAR | Status: AC
  Filled 2018-09-26: qty 1

## 2018-09-26 MED ORDER — INSULIN ASPART 100 UNIT/ML ~~LOC~~ SOLN
4.0000 [IU] | Freq: Three times a day (TID) | SUBCUTANEOUS | Status: DC
  Administered 2018-09-26 – 2018-09-27 (×3): 4 [IU] via SUBCUTANEOUS

## 2018-09-26 MED ORDER — PHENYLEPHRINE 40 MCG/ML (10ML) SYRINGE FOR IV PUSH (FOR BLOOD PRESSURE SUPPORT)
PREFILLED_SYRINGE | INTRAVENOUS | Status: AC
  Filled 2018-09-26: qty 10

## 2018-09-26 MED ORDER — DULOXETINE HCL 60 MG PO CPEP
60.0000 mg | ORAL_CAPSULE | Freq: Every day | ORAL | Status: DC
  Administered 2018-09-27: 60 mg via ORAL
  Filled 2018-09-26: qty 1

## 2018-09-26 MED ORDER — MIDAZOLAM HCL 2 MG/2ML IJ SOLN
1.0000 mg | Freq: Once | INTRAMUSCULAR | Status: AC
  Administered 2018-09-26: 1 mg via INTRAVENOUS

## 2018-09-26 MED ORDER — FUROSEMIDE 40 MG PO TABS
80.0000 mg | ORAL_TABLET | Freq: Every morning | ORAL | Status: DC
  Administered 2018-09-27: 80 mg via ORAL
  Filled 2018-09-26: qty 2

## 2018-09-26 MED ORDER — FLUOXETINE HCL 20 MG PO CAPS
20.0000 mg | ORAL_CAPSULE | Freq: Every day | ORAL | Status: DC
  Administered 2018-09-27: 20 mg via ORAL
  Filled 2018-09-26: qty 1

## 2018-09-26 MED ORDER — LIRAGLUTIDE 18 MG/3ML ~~LOC~~ SOPN: 1.8000 mg | PEN_INJECTOR | SUBCUTANEOUS | Status: DC

## 2018-09-26 MED ORDER — FENOFIBRATE 54 MG PO TABS
54.0000 mg | ORAL_TABLET | Freq: Every day | ORAL | Status: DC
  Administered 2018-09-27: 54 mg via ORAL
  Filled 2018-09-26: qty 1

## 2018-09-26 MED ORDER — EPHEDRINE 5 MG/ML INJ
INTRAVENOUS | Status: AC
  Filled 2018-09-26: qty 10

## 2018-09-26 MED ORDER — METOCLOPRAMIDE HCL 5 MG PO TABS: 5.0000 mg | ORAL_TABLET | Freq: Three times a day (TID) | ORAL | Status: DC | PRN

## 2018-09-26 MED ORDER — FENTANYL CITRATE (PF) 100 MCG/2ML IJ SOLN
50.0000 ug | Freq: Once | INTRAMUSCULAR | Status: AC
  Administered 2018-09-26: 50 ug via INTRAVENOUS

## 2018-09-26 MED ORDER — 0.9 % SODIUM CHLORIDE (POUR BTL) OPTIME
TOPICAL | Status: DC | PRN
  Administered 2018-09-26: 1000 mL

## 2018-09-26 MED ORDER — FENTANYL CITRATE (PF) 250 MCG/5ML IJ SOLN
INTRAMUSCULAR | Status: AC
  Filled 2018-09-26: qty 5

## 2018-09-26 MED ORDER — BUPIVACAINE HCL (PF) 0.25 % IJ SOLN
INTRAMUSCULAR | Status: AC
  Filled 2018-09-26: qty 30

## 2018-09-26 MED ORDER — BISACODYL 10 MG RE SUPP: 10.0000 mg | Freq: Every day | RECTAL | Status: DC | PRN

## 2018-09-26 MED ORDER — METHOCARBAMOL 1000 MG/10ML IJ SOLN
500.0000 mg | Freq: Four times a day (QID) | INTRAVENOUS | Status: DC | PRN
  Filled 2018-09-26: qty 5

## 2018-09-26 MED ORDER — ONDANSETRON HCL 4 MG/2ML IJ SOLN
INTRAMUSCULAR | Status: AC
  Filled 2018-09-26: qty 2

## 2018-09-26 MED ORDER — ONDANSETRON HCL 4 MG/2ML IJ SOLN: 4.0000 mg | Freq: Once | INTRAMUSCULAR | Status: DC | PRN

## 2018-09-26 MED ORDER — FENTANYL CITRATE (PF) 100 MCG/2ML IJ SOLN
INTRAMUSCULAR | Status: AC
  Administered 2018-09-26: 50 ug via INTRAVENOUS
  Filled 2018-09-26: qty 2

## 2018-09-26 MED ORDER — METOCLOPRAMIDE HCL 5 MG/ML IJ SOLN: 5.0000 mg | Freq: Three times a day (TID) | INTRAMUSCULAR | Status: DC | PRN

## 2018-09-26 MED ORDER — GABAPENTIN 300 MG PO CAPS
300.0000 mg | ORAL_CAPSULE | Freq: Two times a day (BID) | ORAL | Status: DC
  Administered 2018-09-26 – 2018-09-27 (×2): 300 mg via ORAL
  Filled 2018-09-26 (×2): qty 1

## 2018-09-26 MED ORDER — LIDOCAINE 2% (20 MG/ML) 5 ML SYRINGE
INTRAMUSCULAR | Status: AC
  Filled 2018-09-26: qty 5

## 2018-09-26 MED ORDER — ONDANSETRON HCL 4 MG PO TABS: 4.0000 mg | ORAL_TABLET | Freq: Four times a day (QID) | ORAL | Status: DC | PRN

## 2018-09-26 MED ORDER — FENTANYL CITRATE (PF) 100 MCG/2ML IJ SOLN
INTRAMUSCULAR | Status: DC | PRN
  Administered 2018-09-26: 50 ug via INTRAVENOUS

## 2018-09-26 MED ORDER — SODIUM CHLORIDE 0.9 % IV SOLN
INTRAVENOUS | Status: DC
  Administered 2018-09-26: 11:00:00 via INTRAVENOUS

## 2018-09-26 MED ORDER — WARFARIN - PHARMACIST DOSING INPATIENT: Freq: Every day | Status: DC

## 2018-09-26 MED ORDER — TIZANIDINE HCL 4 MG PO TABS
4.0000 mg | ORAL_TABLET | Freq: Two times a day (BID) | ORAL | Status: DC
  Administered 2018-09-26 – 2018-09-27 (×2): 4 mg via ORAL
  Filled 2018-09-26 (×2): qty 1

## 2018-09-26 MED ORDER — MIDAZOLAM HCL 2 MG/2ML IJ SOLN
INTRAMUSCULAR | Status: AC
  Administered 2018-09-26: 1 mg via INTRAVENOUS
  Filled 2018-09-26: qty 2

## 2018-09-26 MED ORDER — POLYETHYLENE GLYCOL 3350 17 G PO PACK: 17.0000 g | PACK | Freq: Every day | ORAL | Status: DC | PRN

## 2018-09-26 MED ORDER — MIDAZOLAM HCL 2 MG/2ML IJ SOLN
INTRAMUSCULAR | Status: AC
  Filled 2018-09-26: qty 2

## 2018-09-26 SURGICAL SUPPLY — 35 items
BLADE SAW SGTL MED 73X18.5 STR (BLADE) ×1 IMPLANT
BLADE SURG 21 STRL SS (BLADE) ×2 IMPLANT
BNDG COHESIVE 4X5 TAN STRL (GAUZE/BANDAGES/DRESSINGS) ×3 IMPLANT
BNDG GAUZE ELAST 4 BULKY (GAUZE/BANDAGES/DRESSINGS) ×2 IMPLANT
COVER SURGICAL LIGHT HANDLE (MISCELLANEOUS) ×4 IMPLANT
COVER WAND RF STERILE (DRAPES) ×2 IMPLANT
DRAPE INCISE IOBAN 66X45 STRL (DRAPES) ×2 IMPLANT
DRAPE U-SHAPE 47X51 STRL (DRAPES) ×4 IMPLANT
DRESSING PREVENA PLUS CUSTOM (GAUZE/BANDAGES/DRESSINGS) IMPLANT
DRSG ADAPTIC 3X8 NADH LF (GAUZE/BANDAGES/DRESSINGS) ×2 IMPLANT
DRSG PAD ABDOMINAL 8X10 ST (GAUZE/BANDAGES/DRESSINGS) ×4 IMPLANT
DRSG PREVENA PLUS CUSTOM (GAUZE/BANDAGES/DRESSINGS) ×2
DURAPREP 26ML APPLICATOR (WOUND CARE) ×2 IMPLANT
ELECT REM PT RETURN 9FT ADLT (ELECTROSURGICAL) ×2
ELECTRODE REM PT RTRN 9FT ADLT (ELECTROSURGICAL) ×1 IMPLANT
GAUZE SPONGE 4X4 12PLY STRL (GAUZE/BANDAGES/DRESSINGS) ×2 IMPLANT
GLOVE BIOGEL PI IND STRL 8 (GLOVE) IMPLANT
GLOVE BIOGEL PI IND STRL 9 (GLOVE) ×1 IMPLANT
GLOVE BIOGEL PI INDICATOR 8 (GLOVE) ×1
GLOVE BIOGEL PI INDICATOR 9 (GLOVE) ×1
GLOVE SURG ORTHO 9.0 STRL STRW (GLOVE) ×2 IMPLANT
GOWN STRL REUS W/ TWL XL LVL3 (GOWN DISPOSABLE) ×2 IMPLANT
GOWN STRL REUS W/TWL XL LVL3 (GOWN DISPOSABLE) ×4
KIT BASIN OR (CUSTOM PROCEDURE TRAY) ×2 IMPLANT
KIT DRSG PREVENA PLUS 7DAY 125 (MISCELLANEOUS) ×1 IMPLANT
KIT TURNOVER KIT B (KITS) ×2 IMPLANT
NS IRRIG 1000ML POUR BTL (IV SOLUTION) ×2 IMPLANT
PACK ORTHO EXTREMITY (CUSTOM PROCEDURE TRAY) ×2 IMPLANT
PAD ARMBOARD 7.5X6 YLW CONV (MISCELLANEOUS) ×4 IMPLANT
SPONGE LAP 18X18 RF (DISPOSABLE) ×1 IMPLANT
STOCKINETTE IMPERVIOUS LG (DRAPES) IMPLANT
SUT ETHILON 2 0 PSLX (SUTURE) ×4 IMPLANT
TOWEL OR 17X26 10 PK STRL BLUE (TOWEL DISPOSABLE) ×2 IMPLANT
TUBE CONNECTING 12X1/4 (SUCTIONS) ×2 IMPLANT
YANKAUER SUCT BULB TIP NO VENT (SUCTIONS) ×2 IMPLANT

## 2018-09-26 NOTE — Anesthesia Preprocedure Evaluation (Addendum)
Anesthesia Evaluation  Patient identified by MRN, date of birth, ID band Patient awake    Reviewed: Allergy & Precautions, NPO status , Patient's Chart, lab work & pertinent test results  Airway Mallampati: I  TM Distance: >3 FB Neck ROM: Full    Dental  (+) Teeth Intact, Dental Advisory Given, Missing,    Pulmonary sleep apnea and Continuous Positive Airway Pressure Ventilation , former smoker,    Pulmonary exam normal breath sounds clear to auscultation       Cardiovascular hypertension, Pt. on medications + Peripheral Vascular Disease  Normal cardiovascular exam+ pacemaker  Rhythm:Regular Rate:Normal  Hx/o sick sinus syndrome S/P PPM insertion   Neuro/Psych Seizures -, Well Controlled,  PSYCHIATRIC DISORDERS Depression Diabetic peripheral neuropathy  Neuromuscular disease    GI/Hepatic negative GI ROS, Neg liver ROS,   Endo/Other  diabetes, Well Controlled, Type 2, Insulin Dependent, Oral Hypoglycemic AgentsHypothyroidism Morbid obesityHyperlipidemia Insulin pump  Renal/GU Renal InsufficiencyRenal disease  negative genitourinary   Musculoskeletal  (+) Arthritis , Osteomyelitis right 5th toe/metatarsal Non healing ulcer right 5th toe/metatarsal Hx/o large cell tumor left femur S/P resection   Abdominal (+) + obese,   Peds  Hematology  (+) anemia , Coumadin therapy- last dose   Anesthesia Other Findings   Reproductive/Obstetrics                          Anesthesia Physical Anesthesia Plan  ASA: III  Anesthesia Plan: MAC and Regional   Post-op Pain Management:    Induction:   PONV Risk Score and Plan: 1 and Ondansetron and Treatment may vary due to age or medical condition  Airway Management Planned: Natural Airway, Nasal Cannula and Simple Face Mask  Additional Equipment:   Intra-op Plan:   Post-operative Plan:   Informed Consent: I have reviewed the patients History and  Physical, chart, labs and discussed the procedure including the risks, benefits and alternatives for the proposed anesthesia with the patient or authorized representative who has indicated his/her understanding and acceptance.     Dental advisory given  Plan Discussed with: CRNA and Surgeon  Anesthesia Plan Comments:        Anesthesia Quick Evaluation

## 2018-09-26 NOTE — Progress Notes (Signed)
Ginger, RN entered pts room to hang his Ancef when she turned around to leave the room she noticed pts sheets were bloody. Pulled back the sheet and saw the pts bed was soaked in blood and called Prentiss Bells, Therapist, sports. I then notified Dr. Sharol Given and he gave me a verbal order to take down the wvac dressing and to place dry dressing on pts foot. Wvac removed and dry dressing placed pt tolerated well.

## 2018-09-26 NOTE — Transfer of Care (Signed)
Immediate Anesthesia Transfer of Care Note  Patient: Ethan Campbell  Procedure(s) Performed: RIGHT FOOT 5TH RAY AMPUTATION (Right Foot)  Patient Location: PACU  Anesthesia Type:MAC combined with regional for post-op pain  Level of Consciousness: awake, alert , oriented and patient cooperative  Airway & Oxygen Therapy: Patient Spontanous Breathing and Patient connected to nasal cannula oxygen  Post-op Assessment: Report given to RN and Post -op Vital signs reviewed and stable  Post vital signs: Reviewed and stable  Last Vitals:  Vitals Value Taken Time  BP 99/52 09/26/2018 12:32 PM  Temp 36.5 C 09/26/2018 12:32 PM  Pulse 83 09/26/2018 12:33 PM  Resp 10 09/26/2018 12:33 PM  SpO2 97 % 09/26/2018 12:33 PM  Vitals shown include unvalidated device data.  Last Pain:  Vitals:   09/26/18 0949  TempSrc:   PainSc: 0-No pain         Complications: No apparent anesthesia complications

## 2018-09-26 NOTE — Progress Notes (Signed)
Ethan Campbell (girlfriend) in waiting area, given pt. Belongings per pt.

## 2018-09-26 NOTE — Op Note (Signed)
09/26/2018  12:41 PM  PATIENT:  Ethan Campbell    PRE-OPERATIVE DIAGNOSIS:  Osteomyelitis and Ulcer Right 5th Metatarsal, and osteomyelitis first metatarsal head.  POST-OPERATIVE DIAGNOSIS:  Same  PROCEDURE:  RIGHT FOOT 5TH RAY AMPUTATION Right foot first ray amputation, Local tissue rearrangement for wound closure 4 x 10 cm.  SURGEON:  Newt Minion, MD  PHYSICIAN ASSISTANT:None ANESTHESIA:   General  PREOPERATIVE INDICATIONS:  Ethan Campbell is a  58 y.o. male with a diagnosis of Osteomyelitis and Ulcer Right 5th Metatarsal who failed conservative measures and elected for surgical management.    The risks benefits and alternatives were discussed with the patient preoperatively including but not limited to the risks of infection, bleeding, nerve injury, cardiopulmonary complications, the need for revision surgery, among others, and the patient was willing to proceed.  OPERATIVE IMPLANTS: Praveena plus wound VAC  @ENCIMAGES @  OPERATIVE FINDINGS: Abscess around the first metatarsal head as well as necrotic muscle skin fascia and infected bone around the fifth metatarsal.  OPERATIVE PROCEDURE: Patient was brought the operating room after undergoing a regional block.  The right lower extremity was then prepped using ChloraPrep draped into a sterile field a timeout was called.  Elliptical incision was made around the ulcerative tissue around the first ray the first ray was resected through the midshaft.  Affected tissue skin and soft tissue was resected.  The wound was irrigated with normal saline.  This left a wound that was 5 x 4 cm and local tissue rearrangement was used to close the wound.  Attention was then focused on the fifth ray.  A elliptical incision was made around the ulcerative necrotic tissue the fifth ray was resected.  All wounds were irrigated electrocautery was used for hemostasis.  Local tissue rearrangement was used to close the wound that was 7 x 4 cm.  A Praveena  wound VAC was then used to cover both wounds this was connected Ioban was covered the drapes this had a good suction fit patient was taken the PACU in stable condition.   DISCHARGE PLANNING:  Antibiotic duration: Preoperative antibiotics  Weightbearing: Nonweightbearing on the right  Pain medication: Opioid pathway  Dressing care/ Wound VAC: Continue wound VAC for 1 week  Ambulatory devices: Walker nonweightbearing on the right  Discharge to: Anticipate discharge to home.  Follow-up: In the office 1 week post operative.

## 2018-09-26 NOTE — Progress Notes (Signed)
Pt post-op shoe was soiled by blood when pt wvac backed up. Paged ortho tech phone to get another one delivered no call back will pass along to night shift RN to notify 3/5 day shift RN to follow up.

## 2018-09-26 NOTE — Anesthesia Procedure Notes (Signed)
Anesthesia Regional Block: Adductor canal block   Pre-Anesthetic Checklist: ,, timeout performed, Correct Patient, Correct Site, Correct Laterality, Correct Procedure, Correct Position, site marked, Risks and benefits discussed,  Surgical consent,  Pre-op evaluation,  At surgeon's request and post-op pain management  Laterality: Right  Prep: chloraprep       Needles:  Injection technique: Single-shot  Needle Type: Echogenic Stimulator Needle     Needle Length: 9cm  Needle Gauge: 21   Needle insertion depth: 8 cm   Additional Needles:   Procedures:,,,, ultrasound used (permanent image in chart),,,,  Narrative:  Start time: 09/26/2018 10:21 AM End time: 09/26/2018 10:55 AM Injection made incrementally with aspirations every 5 mL.  Performed by: Personally  Anesthesiologist: Josephine Igo, MD  Additional Notes: Timeout performed. Patient sedated. Relevant anatomy ID'd using [place]. Incremental 2-78ml injection of LA with frequent aspiration. Patient tolerated procedure well.        Right Adductor Canal Block

## 2018-09-26 NOTE — Anesthesia Postprocedure Evaluation (Signed)
Anesthesia Post Note  Patient: Ethan Campbell  Procedure(s) Performed: RIGHT FOOT 5TH RAY AMPUTATION (Right Foot)     Patient location during evaluation: PACU Anesthesia Type: Regional and MAC Level of consciousness: awake and alert and oriented Pain management: pain level controlled Vital Signs Assessment: post-procedure vital signs reviewed and stable Respiratory status: spontaneous breathing and nonlabored ventilation Cardiovascular status: stable and blood pressure returned to baseline Postop Assessment: no apparent nausea or vomiting Anesthetic complications: no    Last Vitals:  Vitals:   09/26/18 1300 09/26/18 1315  BP: 117/62 116/61  Pulse: 82 80  Resp: 12 15  Temp:    SpO2: 97% 95%    Last Pain:  Vitals:   09/26/18 1300  TempSrc:   PainSc: 0-No pain                 Sondra Blixt A.

## 2018-09-26 NOTE — Progress Notes (Signed)
Dr. Royce Macadamia made aware patient on insulin pump and has pacemaker, no new orders given. Will continue to monitor.

## 2018-09-26 NOTE — Progress Notes (Signed)
Patient is on NIV at this time tolerating it well. Settings modified to patient comfort. Patient was unaware of home settings.

## 2018-09-26 NOTE — Anesthesia Procedure Notes (Signed)
Anesthesia Regional Block: Popliteal block   Pre-Anesthetic Checklist: ,, timeout performed, Correct Patient, Correct Site, Correct Laterality, Correct Procedure, Correct Position, site marked, Risks and benefits discussed,  Surgical consent,  Pre-op evaluation,  At surgeon's request and post-op pain management  Laterality: Right  Prep: chloraprep       Needles:  Injection technique: Single-shot  Needle Type: Echogenic Stimulator Needle     Needle Length: 9cm  Needle Gauge: 21   Needle insertion depth: 7 cm   Additional Needles:   Procedures:,,,, ultrasound used (permanent image in chart),,,,  Narrative:  Start time: 09/26/2018 10:46 AM End time: 09/26/2018 10:50 AM Injection made incrementally with aspirations every 5 mL.  Performed by: Personally  Anesthesiologist: Josephine Igo, MD  Additional Notes: Timeout performed. Patient sedated. Relevant anatomy ID'd using [place]. Incremental 2-67ml injection of LA with frequent aspiration. Patient tolerated procedure well.        Right Popliteal Block

## 2018-09-26 NOTE — Anesthesia Procedure Notes (Signed)
Procedure Name: MAC Date/Time: 09/26/2018 11:44 AM Performed by: Colin Benton, CRNA Pre-anesthesia Checklist: Patient identified, Emergency Drugs available, Suction available and Patient being monitored Patient Re-evaluated:Patient Re-evaluated prior to induction Oxygen Delivery Method: Nasal cannula Induction Type: IV induction Placement Confirmation: positive ETCO2 Dental Injury: Teeth and Oropharynx as per pre-operative assessment

## 2018-09-26 NOTE — Telephone Encounter (Signed)
Called pt and lm on vm to advise I am happy to write order for whatever it is that he needs but I do not know what this is. To call back and let me know specifically what this equipment is and where he wants to obtain it and I will do what I can to help him.

## 2018-09-26 NOTE — Progress Notes (Signed)
ANTICOAGULATION CONSULT NOTE - Initial Consult  Pharmacy Consult for warfarin Indication: atrial fibrillation  No Known Allergies  Patient Measurements: Height: 5\' 9"  (175.3 cm) Weight: (!) 310 lb (140.6 kg) IBW/kg (Calculated) : 70.7 Heparin Dosing Weight: 104 kg  Vital Signs: Temp: 98 F (36.7 C) (03/04 1425) Temp Source: Oral (03/04 1425) BP: 139/70 (03/04 1425) Pulse Rate: 87 (03/04 1425)  Labs: Recent Labs    09/26/18 0948 09/26/18 1033  HGB 10.0*  --   HCT 33.4*  --   PLT 442*  --   LABPROT  --  25.0*  INR  --  2.3*  CREATININE 2.36*  --     Estimated Creatinine Clearance: 48.2 mL/min (A) (by C-G formula based on SCr of 2.36 mg/dL (H)).   Medical History: Past Medical History:  Diagnosis Date  . Arm vein blood clot, left    on Coumadin  . Arthritis   . Cancer ([place])    Bone cancer 1987 (in knee Large cell tumor)  . Chronic kidney disease    stage 3  . Depression   . Essential hypertension   . History of stroke   . Hyperlipidemia   . Hypothyroidism   . Insomnia   . Obesity   . Precordial pain June 2011   Nuclear stress; no ischemia; EF 60%  . Presence of permanent cardiac pacemaker   . Proteinuria   . Seizure disorder ([place])   . Sick sinus syndrome ([place])   . Sleep apnea    Dr. Brandon Melnick, uses bipap  . Syncope   . Type 2 diabetes mellitus ([place])   . Venous insufficiency     Medications:  Scheduled:  . atorvastatin  80 mg Oral Daily  . [START ON 09/27/2018] benazepril  20 mg Oral Daily  . docusate sodium  100 mg Oral BID  . [START ON 09/27/2018] DULoxetine  60 mg Oral Daily  . [START ON 09/27/2018] fenofibrate  54 mg Oral Daily  . [START ON 09/27/2018] FLUoxetine  20 mg Oral Daily  . furosemide  40 mg Oral QPM  . [START ON 09/27/2018] furosemide  80 mg Oral q morning - 10a  . gabapentin  300 mg Oral BID  . insulin aspart  0-15 Units Subcutaneous TID WC  . insulin aspart  4 Units Subcutaneous TID WC  . [START ON 09/27/2018] levothyroxine  100 mcg Oral  QAC breakfast  . midodrine  5 mg Oral TID WC  . [START ON 09/27/2018] tamsulosin  0.4 mg Oral Daily  . tiZANidine  4 mg Oral BID    Assessment: 12 yom who presented with development of new ulcer on lateral R foot following R great toe amputation on 05/2018. Now underwent R foot first ray amputation on 3/4.   On warfarin PTA for hx Afib - last dose on 3/3 PM. PTA dose is 7.5 mg daily except 10 mg on Mon/Fri. INR today is 2.3.  Hgb 10, plt 442. No s/sx of bleeding.   Goal of Therapy:  INR 2-3 Monitor platelets by anticoagulation protocol: Yes   Plan:  Order warfarin 7.5 mg tonight Monitor daily INR, CBC, and for s/sx of bleeding  Antonietta Jewel, PharmD, BCCCP Clinical Pharmacist  Pager: [phone] Phone: [phone] 09/26/2018,2:56 PM

## 2018-09-26 NOTE — Progress Notes (Signed)
Pt has an insulin pump but it is empty and does not have a new pod for the omnipod  RN paged DM coordinator on call and she paged back she is going to look up patient and call me back.

## 2018-09-26 NOTE — Progress Notes (Signed)
Orthopedic Tech Progress Note Patient Details:  Ethan Campbell October 14, 1960 [id number]  Ortho Devices Type of Ortho Device: Postop shoe/boot Ortho Device/Splint Location: LRE Ortho Device/Splint Interventions: Application, Adjustment, Ordered   Post Interventions Patient Tolerated: Well Instructions Provided: Care of device, Adjustment of device   Janit Pagan 09/26/2018, 4:47 PM

## 2018-09-26 NOTE — Progress Notes (Signed)
Inpatient Diabetes Program Recommendations  AACE/ADA: New Consensus Statement on Inpatient Glycemic Control (2015)  Target Ranges:  Prepandial:   less than 140 mg/dL      Peak postprandial:   less than 180 mg/dL (1-2 hours)      Critically ill patients:  140 - 180 mg/dL   Lab Results  Component Value Date   GLUCAP 236 (H) 09/26/2018   HGBA1C 6.9 (A) 06/15/2018    Review of Glycemic Control  R foot 5th ray amputation 09/26/2018  Diabetes history: DM2 Outpatient Diabetes medications: Omnipod (see settings below) with U-500 insulin, Victoza 1.8 mg QAM,  Current orders for Inpatient glycemic control: Novolog 0-15 units tidwc + 4 units tidwc  Pt does not have pod for OmniPod insulin pump. Pt states he thought he was being discharged home today after surgery. Family lives 1.5 hours away and cannot bring pod.   HgbA1C - 6.9% Weight - 140.6kg  U-500 insulin in Omnipod (5x strength of regular insulin) Basal - 17.55 units Meal coverage - Lunch 6 units, Dinner 5 units  Total - approx 85 units basal and 30-40 units meal coverage insulin  Inpatient Diabetes Program Recommendations:     Lantus 40 units QHS Novolog 0-15 units Q4H Novolog 10 units tidwc for meal coverage insulin if pt eats > 50% meal.  Called RN with recs and RN to page MD.  Thank you. Lorenda Peck, RD, LDN, CDE Inpatient Diabetes Coordinator [phone]

## 2018-09-26 NOTE — H&P (Signed)
Ethan Campbell is an 58 y.o. male.   Chief Complaint: gangrenous ulcer lateral right foot HPI: The patient is a 58 year old gentleman who is seen for postoperative follow-up following a right great toe amputation on 06/20/2018 and development of a new ulcer over the lateral right foot.  He has been on Silvadene dressing changes for the past couple of weeks and Septra DS 1 p.o. twice daily.  He has had serosanguineous drainage from the area and some odor.  He has developed dark adherent eschar over the lateral foot wound.  Past Medical History:  Diagnosis Date  . Arm vein blood clot, left    on Coumadin  . Arthritis   . 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 Chronic kidney disease    stage 3  . Depression   . Essential hypertension   . History of stroke   . Hyperlipidemia   . Hypothyroidism   . Insomnia   . Obesity   . Precordial pain June 2011   Nuclear stress; no ischemia; EF 60%  . Presence of permanent cardiac pacemaker   . Proteinuria   . Seizure disorder ([place])   . Sick sinus syndrome ([place])   . Sleep apnea    Dr. Brandon Melnick, uses bipap  . Syncope   . Type 2 diabetes mellitus ([place])   . Venous insufficiency     Past Surgical History:  Procedure Laterality Date  . AMPUTATION Right 06/20/2018   Procedure: RIGHT GREAT TOE AMPUTATION;  Surgeon: Newt Minion, MD;  Location: [facility];  Service: Orthopedics;  Laterality: Right;  . COLONOSCOPY    . RESECTION BONE TUMOR FEMUR  1980's   Left femur, treated at [facility] with bone graft  . TOTAL KNEE ARTHROPLASTY Left 2013    Family History  Problem Relation Age of Onset  . Heart attack Mother 64  . Breast cancer Mother   . Stroke Mother   . Heart attack Father 85  . Breast cancer Sister   . Colon cancer Sister    Social History:  reports that he has quit smoking. His smoking use included cigarettes. He has never used smokeless tobacco. He reports that he does not drink alcohol or use drugs.  Allergies:  No Known Allergies  No medications prior to admission.    No results found for this or any previous visit (from the past 48 hour(s)). No results found.  Review of Systems  All other systems reviewed and are negative.   There were no vitals taken for this visit. Physical Exam  Patient is alert, oriented, no adenopathy, well-dressed, normal affect, normal respiratory effort. The right great toe amputation site has nearly healed with a small residual 1 cm area of hyper granulation and scant serous drainage.  There is edema of the foot.  The lateral foot has a large dark adherent eschar present and this was debrided with a #10 blade knife to bleeding tissue however all the muscle was necrotic and there is no bone exposed.  We will plan for amputation of the right fifth ray.  Assessment/Plan 1. Right great toe amputee (Ethan Campbell)   2. Foot ulcer with necrosis of muscle, right ([place])   3. Type 2 diabetes mellitus with diabetic polyneuropathy, with long-term current use of insulin ([place])   4. Chronic venous hypertension (idiopathic) with inflammation of bilateral lower extremity     Plan: The dark eschar over the lateral foot was debrided with a #10 blade knife  but does track to the bone. discussed with the patient further surgery with amputation of the right fifth ray in order to heal the area and the patient is in agreement.  We will plan for surgery later this week and he will follow-up next week.   Newt Minion, MD 09/26/2018, 6:40 AM

## 2018-09-27 ENCOUNTER — Encounter (HOSPITAL_COMMUNITY): Payer: Self-pay | Admitting: Orthopedic Surgery

## 2018-09-27 ENCOUNTER — Other Ambulatory Visit (INDEPENDENT_AMBULATORY_CARE_PROVIDER_SITE_OTHER): Payer: Self-pay

## 2018-09-27 ENCOUNTER — Telehealth (INDEPENDENT_AMBULATORY_CARE_PROVIDER_SITE_OTHER): Payer: Self-pay

## 2018-09-27 DIAGNOSIS — E11621 Type 2 diabetes mellitus with foot ulcer: Secondary | ICD-10-CM | POA: Diagnosis not present

## 2018-09-27 LAB — PROTIME-INR
INR: 2.8 — ABNORMAL HIGH (ref 0.8–1.2)
Prothrombin Time: 29 seconds — ABNORMAL HIGH (ref 11.4–15.2)

## 2018-09-27 LAB — GLUCOSE, CAPILLARY
Glucose-Capillary: 181 mg/dL — ABNORMAL HIGH (ref 70–99)
Glucose-Capillary: 272 mg/dL — ABNORMAL HIGH (ref 70–99)

## 2018-09-27 LAB — CBC
HCT: 27.4 % — ABNORMAL LOW (ref 39.0–52.0)
Hemoglobin: 8.5 g/dL — ABNORMAL LOW (ref 13.0–17.0)
MCH: 27.8 pg (ref 26.0–34.0)
MCHC: 31 g/dL (ref 30.0–36.0)
MCV: 89.5 fL (ref 80.0–100.0)
PLATELETS: 389 10*3/uL (ref 150–400)
RBC: 3.06 MIL/uL — ABNORMAL LOW (ref 4.22–5.81)
RDW: 15.5 % (ref 11.5–15.5)
WBC: 8.6 10*3/uL (ref 4.0–10.5)
nRBC: 0 % (ref 0.0–0.2)

## 2018-09-27 NOTE — Evaluation (Signed)
Physical Therapy Evaluation Patient Details Name: Ethan Campbell MRN: [id number] DOB: 1961-04-17 Today's Date: 09/27/2018   History of Present Illness  Pt is a 58 y.o. male s/p R 5th ray amputation.   Clinical Impression  PT eval complete. Pt demonstrated modified independence with bed mobility. Supervision provided for transfers and ambulation 150 feet with RW. Pt able to maintain TDWB RLE. No reports of dizziness with mobility. Plan is for d/c home today. Pt will have 24-hour assist from his live-in girlfriend. His home has a ramped entrance and he has all needed DME. PT signing off.    Follow Up Recommendations No PT follow up;Supervision for mobility/OOB    Equipment Recommendations  None recommended by PT    Recommendations for Other Services       Precautions / Restrictions Restrictions RLE Weight Bearing: Touchdown weight bearing      Mobility  Bed Mobility Overal bed mobility: Modified Independent             General bed mobility comments: increased time  Transfers Overall transfer level: Needs assistance Equipment used: Rolling walker (2 wheeled) Transfers: Sit to/from Stand Sit to Stand: Supervision         General transfer comment: cues for hand placement  Ambulation/Gait Ambulation/Gait assistance: Supervision Gait Distance (Feet): 150 Feet Assistive device: Rolling walker (2 wheeled) Gait Pattern/deviations: Step-to pattern;Decreased weight shift to right Gait velocity: decreased Gait velocity interpretation: <1.31 ft/sec, indicative of household ambulator General Gait Details: Pt able to maintain TDWB RLE, supervision for safety  Stairs            Wheelchair Mobility    Modified Rankin (Stroke Patients Only)       Balance Overall balance assessment: Mild deficits observed, not formally tested                                           Pertinent Vitals/Pain Pain Assessment: No/denies pain    Home Living  Family/patient expects to be discharged to:: Private residence Living Arrangements: Spouse/significant other Available Help at Discharge: Family;Available 24 hours/day Type of Home: Mobile home Home Access: Ramped entrance     Home Layout: One level Home Equipment: [place] - 4 wheels;Walker - 2 wheels;Crutches;Bedside commode;Toilet riser;Shower seat      Prior Function Level of Independence: Independent with assistive device(s)         Comments: ambulates with crutches      Hand Dominance        Extremity/Trunk Assessment        Lower Extremity Assessment Lower Extremity Assessment: RLE deficits/detail;LLE deficits/detail RLE Sensation: history of peripheral neuropathy LLE Sensation: history of peripheral neuropathy    Cervical / Trunk Assessment Cervical / Trunk Assessment: Normal  Communication   Communication: No difficulties  Cognition Arousal/Alertness: Awake/alert Behavior During Therapy: WFL for tasks assessed/performed Overall Cognitive Status: Within Functional Limits for tasks assessed                                        General Comments      Exercises     Assessment/Plan    PT Assessment Patent does not need any further PT services  PT Problem List         PT Treatment Interventions      PT Goals (Current  goals can be found in the Care Plan section)  Acute Rehab PT Goals Patient Stated Goal: home today PT Goal Formulation: All assessment and education complete, DC therapy    Frequency     Barriers to discharge        Co-evaluation               AM-PAC PT "6 Clicks" Mobility  Outcome Measure Help needed turning from your back to your side while in a flat bed without using bedrails?: None Help needed moving from lying on your back to sitting on the side of a flat bed without using bedrails?: None Help needed moving to and from a bed to a chair (including a wheelchair)?: None Help needed standing up from a  chair using your arms (e.g., wheelchair or bedside chair)?: None Help needed to walk in hospital room?: A Little Help needed climbing 3-5 steps with a railing? : A Little 6 Click Score: 22    End of Session Equipment Utilized During Treatment: Gait belt Activity Tolerance: Patient tolerated treatment well Patient left: in chair;with call bell/phone within reach Nurse Communication: Mobility status PT Visit Diagnosis: Difficulty in walking, not elsewhere classified (R26.2)    Time: 0131-4388 PT Time Calculation (min) (ACUTE ONLY): 14 min   Charges:   PT Evaluation $PT Eval Low Complexity: 1 Low          Lorrin Goodell, PT  Office # [phone] Pager [phone]   Lorriane Shire 09/27/2018, 9:55 AM

## 2018-09-27 NOTE — Care Management (Signed)
Case manager provided patient with copy of DME order for Knee scooter rental. They will take to [facility]. No further CM needs identified.

## 2018-09-27 NOTE — Telephone Encounter (Signed)
I called and [facility] and they confirmed that they would submit the PA to the insurance company to cover the rental and just needed the last office visit note. I faxed this to [phone] and called to advise pt's wife that this has been done. To call with any questions.

## 2018-09-27 NOTE — Discharge Summary (Signed)
Discharge Diagnoses:  Active Problems:   Subacute osteomyelitis, right ankle and foot (HCC)   Amputated toe, right (HCC)   Surgeries: Procedure(s): RIGHT FOOT 5TH RAY AMPUTATION on 09/26/2018    Consultants:   Discharged Condition: Improved  Hospital Course: Ethan Campbell is an 58 y.o. male who was admitted 09/26/2018 with a chief complaint of ulceration right fifth ray with abscess right great toe, with a final diagnosis of Osteomyelitis and Ulcer Right 5th Metatarsal.  Patient was brought to the operating room on 09/26/2018 and underwent Procedure(s): [place].    Patient was given perioperative antibiotics:  Anti-infectives (From admission, onward)   Start     Dose/Rate Route Frequency Ordered Stop   09/26/18 1800  ceFAZolin (ANCEF) IVPB 2g/100 mL premix     2 g 200 mL/hr over 30 Minutes Intravenous Every 6 hours 09/26/18 1413 09/27/18 0630   09/26/18 1100  ceFAZolin (ANCEF) 3 g in dextrose 5 % 50 mL IVPB     3 g 100 mL/hr over 30 Minutes Intravenous To ShortStay Surgical 09/25/18 1225 09/26/18 1144    .  Patient was given sequential compression devices, early ambulation, and aspirin for DVT prophylaxis.  Recent vital signs:  Patient Vitals for the past 24 hrs:  BP Temp Temp src Pulse Resp SpO2 Height Weight  09/27/18 0448 (!) 148/79 98.4 F (36.9 C) Oral 82 16 - - -  09/27/18 0016 (!) 155/71 98.6 F (37 C) Oral 86 15 97 % - -  09/26/18 2210 - - - 82 16 99 % - -  09/26/18 2023 (!) 152/67 98.5 F (36.9 C) Oral 87 16 97 % - -  09/26/18 1815 (!) 147/64 - - 86 - 96 % - -  09/26/18 1625 132/77 98.3 F (36.8 C) Oral 81 - 98 % - -  09/26/18 1425 139/70 98 F (36.7 C) Oral 87 16 98 % - -  09/26/18 1400 118/60 97.7 F (36.5 C) - 80 12 94 % - -  09/26/18 1345 (!) 119/58 - - 79 12 99 % - -  09/26/18 1330 123/66 - - 82 16 97 % - -  09/26/18 1315 116/61 - - 80 15 95 % - -  09/26/18 1300 117/62 - - 82 12 97 % - -  09/26/18 1245 (!) 112/58 - - 82 10 97 % -  -  09/26/18 1232 (!) 99/52 97.7 F (36.5 C) - 81 13 98 % - -  09/26/18 1100 (!) 161/50 - - 80 11 100 % - -  09/26/18 1055 (!) 163/52 - - 73 10 100 % - -  09/26/18 1050 (!) 157/69 - - 71 11 99 % - -  09/26/18 1045 (!) 168/73 - - 77 17 100 % - -  09/26/18 0942 (!) 159/65 97.9 F (36.6 C) Oral 92 20 100 % 5\' 9"  (1.753 m) (!) 140.6 kg  .  Recent laboratory studies: No results found.  Discharge Medications:   Allergies as of 09/27/2018   No Known Allergies     Medication List    TAKE these medications   aspirin EC 81 MG tablet Take 81 mg by mouth daily.   atorvastatin 80 MG tablet Commonly known as:  LIPITOR TAKE 1 TABLET DAILY.   benazepril 20 MG tablet Commonly known as:  LOTENSIN Take 20 mg by mouth daily.   DULoxetine 60 MG capsule Commonly known as:  CYMBALTA Take 60 mg by mouth daily.   fenofibrate 145 MG tablet  Commonly known as:  TRICOR Take 145 mg by mouth daily.   FLUoxetine 20 MG capsule Commonly known as:  PROZAC Take 20 mg by mouth daily.   furosemide 80 MG tablet Commonly known as:  LASIX Take 40-80 mg by mouth See admin instructions. Takes one tablet in the morning and one-half tablet in the evening   gabapentin 300 MG capsule Commonly known as:  NEURONTIN Take 300 mg by mouth 2 (two) times daily.   glucose blood test strip Commonly known as:  ACCU-CHEK AVIVA PLUS TEST UP TO 4 TIMES DAILY Dx code E11.65   glucose blood test strip Commonly known as:  FREESTYLE LITE Check sugar 3 times daily   HUMULIN R 500 UNIT/ML injection Generic drug:  insulin regular human CONCENTRATED USE AS DIRECTED, INJECT UP TO 30 UNITS 3 TIMES A DAY. What changed:  See the new instructions.   levothyroxine 100 MCG tablet Commonly known as:  SYNTHROID, LEVOTHROID Take 1 tablet daily before breakfast What changed:    how much to take  how to take this  when to take this   liraglutide 18 MG/3ML Sopn Commonly known as:  VICTOZA USE 1.8MG  SUBCUTANEOUSLY  DAILY. What changed:    how much to take  how to take this  when to take this   meclizine 25 MG tablet Commonly known as:  ANTIVERT Take 25 mg by mouth 3 (three) times daily as needed for dizziness.   midodrine 5 MG tablet Commonly known as:  PROAMATINE Take 5 mg by mouth 3 (three) times daily with meals.   OMNIPOD 5 PACK Misc 1 Package by Does not apply route every 3 (three) days.   silver sulfADIAZINE 1 % cream Commonly known as:  SILVADENE Apply 1 application topically daily. Apply to right foot daily   sulfamethoxazole-trimethoprim 800-160 MG tablet Commonly known as:  BACTRIM DS,SEPTRA DS Take 1 tablet by mouth 2 (two) times daily.   tamsulosin 0.4 MG Caps capsule Commonly known as:  FLOMAX Take 0.4 mg by mouth daily.   tiZANidine 4 MG tablet Commonly known as:  ZANAFLEX Take 4 mg by mouth 2 (two) times daily.   warfarin 10 MG tablet Commonly known as:  COUMADIN Take 10 mg by mouth See admin instructions. Mon and Fri   warfarin 7.5 MG tablet Commonly known as:  COUMADIN Take 7.5 mg by mouth See admin instructions. Su Tu W Th Sa       Diagnostic Studies: No results found.  Patient benefited maximally from their hospital stay and there were no complications.     Disposition: Discharge disposition: 01-Home or Self Care      Discharge Instructions    Call MD / Call 911   Complete by:  As directed    If you experience chest pain or shortness of breath, CALL 911 and be transported to the hospital emergency room.  If you develope a fever above 101 F, pus (white drainage) or increased drainage or redness at the wound, or calf pain, call your surgeon's office.   Constipation Prevention   Complete by:  As directed    Drink plenty of fluids.  Prune juice may be helpful.  You may use a stool softener, such as Colace (over the counter) 100 mg twice a day.  Use MiraLax (over the counter) for constipation as needed.   Diet - low sodium heart healthy   Complete  by:  As directed    Increase activity slowly as tolerated   Complete by:  As directed    Post-op shoe   Complete by:  As directed      Follow-up Information    Newt Minion, MD In 1 week.   Specialty:  Orthopedic Surgery Contact information: [place] [address] [phone]            Signed: Newt Minion 09/27/2018, 6:49 AM

## 2018-09-27 NOTE — Progress Notes (Signed)
Patient ID: Ethan Campbell, male   DOB: 1961-03-30, 58 y.o.   MRN: [id number] Secondary to patient's blood thinner he did have bleeding into the wound VAC sponge which clotted.  The VAC dressing was removed.  Patient has no active bleeding in his current dry dressing.  Patient states he has no pain plan for discharge today.

## 2018-09-27 NOTE — Progress Notes (Signed)
Patient and partner given AVS d/c instructions. No further questions from patient. DME order given for patient to get knee scooter. No respiratory distress. AxO x4. No c/o pain. IV removed.

## 2018-09-27 NOTE — Discharge Instructions (Signed)

## 2018-09-27 NOTE — Telephone Encounter (Signed)
Patients wife Hilda Blades called stating that patient had surgery with Dr Sharol Given yesterday and they are needing an rx for a knee scooter.  However, she stated per the pharmacy they use, [facility] in [place], it will need PA through [facility] so they will need OV notes faxed to [facility]. She asked for a return call on her cell phone because she is on the way to pick him up from the hospital  Contact number is [phone]

## 2018-10-01 ENCOUNTER — Telehealth (INDEPENDENT_AMBULATORY_CARE_PROVIDER_SITE_OTHER): Payer: Self-pay | Admitting: Orthopedic Surgery

## 2018-10-01 NOTE — Telephone Encounter (Signed)
I called and sw pt's wife to advise that the letter we faxed to [facility] had a diagnosis on it of a right 5th ray amputation. I advised wife that I would call and sw "heather" who is in charge of the medical equipment at the pharmacy. Pt asked if I would call her back after call was made. I called [phone] and was advised that Nira Conn was at lunch and to try her again later.

## 2018-10-01 NOTE — Telephone Encounter (Signed)
Patient's wife Hilda Blades called advised the form was faxed back to [place] this morning needing a Diag. on it before the knee scooter can be approved. The number to contact Hilda Blades is [phone]

## 2018-10-01 NOTE — Telephone Encounter (Signed)
Called and sw heather advised her to email the needed paperwork to me as we are having difficulty with the fax. Called the pt and lm on vm to advise of this. To call with any questions. Will notify once that paperwork has been completed.

## 2018-10-02 ENCOUNTER — Telehealth (INDEPENDENT_AMBULATORY_CARE_PROVIDER_SITE_OTHER): Payer: Self-pay | Admitting: *Deleted

## 2018-10-02 NOTE — Telephone Encounter (Signed)
I called and sw pt's wife to advise that the medicaid prior auth has been completed and faxed to pharm and to call with any other questions.

## 2018-10-02 NOTE — Telephone Encounter (Signed)
pts wife Hilda Blades called wanting to know if you have heard anything about the PA for the knee board, states pt can not walk at all.   Hilda Blades said you had to leave to run an errand but can leave message on machine, Please call [phone]

## 2018-10-02 NOTE — Telephone Encounter (Signed)
We do have fax.will return once completed

## 2018-10-02 NOTE — Telephone Encounter (Signed)
Do we have any paperwork on this pt at all? I have called the pharm and they state that they have faxed. I have request that the e-mail it to me but I have not received anything. Just trying to check all areas before I call back.

## 2018-10-04 ENCOUNTER — Ambulatory Visit (INDEPENDENT_AMBULATORY_CARE_PROVIDER_SITE_OTHER): Payer: Self-pay | Admitting: Physician Assistant

## 2018-10-04 ENCOUNTER — Encounter (HOSPITAL_COMMUNITY): Payer: Self-pay | Admitting: *Deleted

## 2018-10-04 ENCOUNTER — Encounter (INDEPENDENT_AMBULATORY_CARE_PROVIDER_SITE_OTHER): Payer: Self-pay | Admitting: Physician Assistant

## 2018-10-04 ENCOUNTER — Ambulatory Visit (INDEPENDENT_AMBULATORY_CARE_PROVIDER_SITE_OTHER): Payer: Medicaid Other | Admitting: Physician Assistant

## 2018-10-04 ENCOUNTER — Other Ambulatory Visit: Payer: Self-pay

## 2018-10-04 VITALS — Ht 69.0 in | Wt 310.0 lb

## 2018-10-04 DIAGNOSIS — Z89421 Acquired absence of other right toe(s): Secondary | ICD-10-CM

## 2018-10-04 DIAGNOSIS — Z89411 Acquired absence of right great toe: Secondary | ICD-10-CM

## 2018-10-04 DIAGNOSIS — Z6841 Body Mass Index (BMI) 40.0 and over, adult: Secondary | ICD-10-CM

## 2018-10-04 DIAGNOSIS — I87323 Chronic venous hypertension (idiopathic) with inflammation of bilateral lower extremity: Secondary | ICD-10-CM

## 2018-10-04 DIAGNOSIS — L97312 Non-pressure chronic ulcer of right ankle with fat layer exposed: Secondary | ICD-10-CM

## 2018-10-04 DIAGNOSIS — Z794 Long term (current) use of insulin: Secondary | ICD-10-CM

## 2018-10-04 DIAGNOSIS — E1142 Type 2 diabetes mellitus with diabetic polyneuropathy: Secondary | ICD-10-CM

## 2018-10-04 NOTE — Progress Notes (Signed)
Office Visit Note   Patient: Ethan Campbell           Date of Birth: 31-Aug-1960           MRN: [id number] Visit Date: 10/04/2018              Requested by: Arsenio Katz, NP [place], [place] [address] PCP: Arsenio Katz, NP  Chief Complaint  Patient presents with  . Right Foot - Routine Post Op    09/26/2018 right foot 5th ray amputation       HPI: The patient is a 58 year old gentleman here with his wife and daughter and grandchild for follow-up for a right great toe first ray amputation and fifth ray amputation of the right foot for for gangrene on 09/26/2018.  He has been walking some on the foot and reports some bloody drainage from the incision.  He is chronically anticoagulated on warfarin.  His last hemoglobin A1c was 6.9. He does have a new wound over the ankle area as well.  He reports that he has been feeling very poorly and fatigued and does feel like the foot is causing him to feel much worse.  Assessment & Plan: Visit Diagnoses:  1. History of complete ray amputation of right great toe ([place])   2. History of complete ray amputation of fifth toe of right foot ([place])   3. Ankle ulcer, right, with fat layer exposed ([place])   4. Type 2 diabetes mellitus with diabetic polyneuropathy, with long-term current use of insulin ([place])   5. Chronic venous hypertension (idiopathic) with inflammation of bilateral lower extremity   6. Body mass index 45.0-49.9, adult ([place])     Plan: A lengthy discussion was had with the patient's including the poor healing despite good pedal pulses.  We discussed that if his microcirculation to the foot is very poor and that is why he is having significant difficulty healing.  Treatment options were discussed at length with the patient and the family including trying Trental and nitroglycerin patches to improve the blood flow to the foot versus proceeding with a below the knee amputation.  The patient reports that he would like to proceed with a below  the knee amputation on the right and will set the surgery up for later this week.  Follow-Up Instructions: Return in about 12 days (around 10/16/2018).   Ortho Exam  Patient is alert, oriented, no adenopathy, well-dressed, normal affect, normal respiratory effort. There is dehiscence of the lateral foot incision and new necrosis of the superior incisional wound bed.  There are some sutures that have pulled through.  There is no significant drainage from the area but significant dehiscence.  The medial first ray amputation site is also dehiscing.  There are now gangrenous changes over both of these areas as well as a new dark gangrenous area over the posterior ankle over the Achilles tendon.  There are palpable pulses and he has a triphasic dorsalis pedis pulse by Doppler.  Imaging: No results found. No images are attached to the encounter.  Labs: Lab Results  Component Value Date   HGBA1C 6.9 (A) 06/15/2018   HGBA1C 10.9 (A) 02/21/2018   HGBA1C 13.4 09/11/2017     Lab Results  Component Value Date   ALBUMIN 3.9 09/30/2015   ALBUMIN 4.1 09/03/2015   ALBUMIN 4.0 04/02/2014    Body mass index is 45.78 kg/m.  Orders:  No orders of the defined types were placed in this encounter.  No orders  of the defined types were placed in this encounter.    Procedures: No procedures performed  Clinical Data: No additional findings.  ROS:  All other systems negative, except as noted in the HPI. Review of Systems  Objective: Vital Signs: Ht _0  (1.753 m)   Wt (!) 310 lb (140.6 kg)   BMI 45.78 kg/m   Specialty Comments:  No specialty comments available.  PMFS History: Patient Active Problem List   Diagnosis Date Noted  . Amputated toe, right ([place]) 09/26/2018  . Subacute osteomyelitis, right ankle and foot ([place])   . Osteomyelitis of great toe of right foot ([place]) 06/19/2018  . Diabetic polyneuropathy associated with type 2 diabetes mellitus ([place]) 06/19/2018  . Body mass  index 45.0-49.9, adult ([place]) 06/19/2018  . Seizure disorder ([place]) 08/27/2015  . Somnolence, daytime 08/27/2015  . Orthostatic hypotension 11/27/2014  . HLD (hyperlipidemia) 11/27/2014  . Syncope and collapse 08/20/2014  . Diabetic autonomic neuropathy associated with type 2 diabetes mellitus ([place]) 05/01/2014  . Dizzy spells 03/17/2014  . Chronic kidney disease, stage III (moderate) ([place]) 08/22/2013  . Type II diabetes mellitus, uncontrolled ([place]) 12/03/2009  . HYPERLIPIDEMIA 12/03/2009  . Morbid obesity ([place]) 12/03/2009  . OBSTRUCTIVE SLEEP APNEA 12/03/2009  . ESSENTIAL HYPERTENSION, BENIGN 12/03/2009  . EDEMA 12/03/2009  . PRECORDIAL PAIN 12/03/2009  . PROTEINURIA 12/03/2009   Past Medical History:  Diagnosis Date  . Arm vein blood clot, left    on Coumadin  . Arthritis   . Cancer ([place])    Bone cancer 1987 (in knee Large cell tumor)  . Chronic kidney disease    stage 3  . Depression   . Essential hypertension   . History of stroke   . Hyperlipidemia   . Hypothyroidism   . Insomnia   . Obesity   . Precordial pain June 2011   Nuclear stress; no ischemia; EF 60%  . Presence of permanent cardiac pacemaker   . Proteinuria   . Seizure disorder ([place])   . Sick sinus syndrome ([place])   . Sleep apnea    Dr. Brandon Melnick, uses bipap  . Syncope   . Type 2 diabetes mellitus ([place])   . Venous insufficiency     Family History  Problem Relation Age of Onset  . Heart attack Mother 65  . Breast cancer Mother   . Stroke Mother   . Heart attack Father 65  . Breast cancer Sister   . Colon cancer Sister     Past Surgical History:  Procedure Laterality Date  . AMPUTATION Right 06/20/2018   Procedure: RIGHT GREAT TOE AMPUTATION;  Surgeon: Newt Minion, MD;  Location: [place];  Service: Orthopedics;  Laterality: Right;  . AMPUTATION Right 09/26/2018   Procedure: RIGHT FOOT 5TH RAY AMPUTATION;  Surgeon: Newt Minion, MD;  Location: [facility];  Service: Orthopedics;  Laterality: Right;  MAC and  regional anesthesia  . COLONOSCOPY    . RESECTION BONE TUMOR FEMUR  1980's   Left femur, [facility] at [facility] with bone graft  . TOTAL KNEE ARTHROPLASTY Left 2013   Social History   Occupational History  . Occupation: Disabled  Tobacco Use  . Smoking status: Former Smoker    Types: Cigarettes  . Smokeless tobacco: Never Used  . Tobacco comment: 11/27/14 "quit smoking years ago"  Substance and Sexual Activity  . Alcohol use: No    Alcohol/week: 0.0 standard drinks  . Drug use: No  . Sexual activity: Never

## 2018-10-04 NOTE — Progress Notes (Signed)
Spoke with pt for pre-op call. Pt has hx of Sick Sinus Syndrome and has a pacemaker. Pt denies any other cardiac history. Pt sees Phineas Inches Ritsche, NP for cardiology. Pt is on Coumadin and he states Dr. Sharol Given told him to hold tonight's dose before surgery.  Pt is a type 2 diabetic. Last A1C was 6.9 on 06/15/18. Pt has an Omnipod Insulin Pump and usues Humulin R 500 units/ml. I asked pt what Dr. Dwyane Dee had told him a week ago about his Omnipod pump with the U500 insulin. He states Dr. Dwyane Dee said not to change anything. I instructed pt to follow those same instructions. Pt has a device that he uses that stays on his arm and it constantly checks his blood sugar. He states his fasting blood sugar is usually between 140-150. Did instruct him to to watch is blood sugar in the AM and if his blood sugar drops below 70 to treat it with a 1/2 cup of clear juice (apple or cranberry) and recheck his blood sugar 15 minutes after drinking the juice. Pt voiced understanding.

## 2018-10-05 ENCOUNTER — Inpatient Hospital Stay (HOSPITAL_COMMUNITY): Payer: Medicaid Other | Admitting: Certified Registered Nurse Anesthetist

## 2018-10-05 ENCOUNTER — Encounter (HOSPITAL_COMMUNITY): Payer: Self-pay

## 2018-10-05 ENCOUNTER — Encounter (HOSPITAL_COMMUNITY)
Admission: RE | Disposition: A | Discharge status: Skilled Nursing Facility | Payer: Self-pay | Source: Home / Self Care | Attending: Orthopedic Surgery

## 2018-10-05 ENCOUNTER — Other Ambulatory Visit: Payer: Self-pay

## 2018-10-05 ENCOUNTER — Telehealth: Payer: Self-pay | Admitting: Endocrinology

## 2018-10-05 ENCOUNTER — Inpatient Hospital Stay (HOSPITAL_COMMUNITY)
Admission: RE | Admit: 2018-10-05 | Discharge: 2018-10-10 | DRG: 475 | Disposition: A | Discharge status: Skilled Nursing Facility | Payer: Medicaid Other | Attending: Orthopedic Surgery | Admitting: Orthopedic Surgery

## 2018-10-05 DIAGNOSIS — I129 Hypertensive chronic kidney disease with stage 1 through stage 4 chronic kidney disease, or unspecified chronic kidney disease: Secondary | ICD-10-CM | POA: Diagnosis present

## 2018-10-05 DIAGNOSIS — Z87891 Personal history of nicotine dependence: Secondary | ICD-10-CM | POA: Diagnosis not present

## 2018-10-05 DIAGNOSIS — G4700 Insomnia, unspecified: Secondary | ICD-10-CM | POA: Diagnosis present

## 2018-10-05 DIAGNOSIS — T8781 Dehiscence of amputation stump: Secondary | ICD-10-CM | POA: Diagnosis present

## 2018-10-05 DIAGNOSIS — Z8249 Family history of ischemic heart disease and other diseases of the circulatory system: Secondary | ICD-10-CM

## 2018-10-05 DIAGNOSIS — M1990 Unspecified osteoarthritis, unspecified site: Secondary | ICD-10-CM | POA: Diagnosis present

## 2018-10-05 DIAGNOSIS — G4730 Sleep apnea, unspecified: Secondary | ICD-10-CM | POA: Diagnosis present

## 2018-10-05 DIAGNOSIS — Z950 Presence of cardiac pacemaker: Secondary | ICD-10-CM | POA: Diagnosis not present

## 2018-10-05 DIAGNOSIS — E039 Hypothyroidism, unspecified: Secondary | ICD-10-CM | POA: Diagnosis present

## 2018-10-05 DIAGNOSIS — I495 Sick sinus syndrome: Secondary | ICD-10-CM | POA: Diagnosis present

## 2018-10-05 DIAGNOSIS — G40909 Epilepsy, unspecified, not intractable, without status epilepticus: Secondary | ICD-10-CM | POA: Diagnosis present

## 2018-10-05 DIAGNOSIS — I96 Gangrene, not elsewhere classified: Secondary | ICD-10-CM | POA: Diagnosis not present

## 2018-10-05 DIAGNOSIS — E785 Hyperlipidemia, unspecified: Secondary | ICD-10-CM | POA: Diagnosis present

## 2018-10-05 DIAGNOSIS — Z7901 Long term (current) use of anticoagulants: Secondary | ICD-10-CM | POA: Diagnosis not present

## 2018-10-05 DIAGNOSIS — Z7982 Long term (current) use of aspirin: Secondary | ICD-10-CM | POA: Diagnosis not present

## 2018-10-05 DIAGNOSIS — Z823 Family history of stroke: Secondary | ICD-10-CM

## 2018-10-05 DIAGNOSIS — E1122 Type 2 diabetes mellitus with diabetic chronic kidney disease: Secondary | ICD-10-CM | POA: Diagnosis present

## 2018-10-05 DIAGNOSIS — E1152 Type 2 diabetes mellitus with diabetic peripheral angiopathy with gangrene: Secondary | ICD-10-CM | POA: Diagnosis present

## 2018-10-05 DIAGNOSIS — Z803 Family history of malignant neoplasm of breast: Secondary | ICD-10-CM | POA: Diagnosis not present

## 2018-10-05 DIAGNOSIS — Z8673 Personal history of transient ischemic attack (TIA), and cerebral infarction without residual deficits: Secondary | ICD-10-CM | POA: Diagnosis not present

## 2018-10-05 DIAGNOSIS — N183 Chronic kidney disease, stage 3 (moderate): Secondary | ICD-10-CM | POA: Diagnosis present

## 2018-10-05 DIAGNOSIS — Z794 Long term (current) use of insulin: Secondary | ICD-10-CM

## 2018-10-05 DIAGNOSIS — S88111A Complete traumatic amputation at level between knee and ankle, right lower leg, initial encounter: Secondary | ICD-10-CM | POA: Diagnosis present

## 2018-10-05 HISTORY — PX: AMPUTATION: SHX166

## 2018-10-05 LAB — GLUCOSE, CAPILLARY
GLUCOSE-CAPILLARY: 128 mg/dL — AB (ref 70–99)
Glucose-Capillary: 252 mg/dL — ABNORMAL HIGH (ref 70–99)
Glucose-Capillary: 164 mg/dL — ABNORMAL HIGH (ref 70–99)
Glucose-Capillary: 210 mg/dL — ABNORMAL HIGH (ref 70–99)
Glucose-Capillary: 251 mg/dL — ABNORMAL HIGH (ref 70–99)

## 2018-10-05 LAB — PROTIME-INR
INR: 3.1 — AB (ref 0.8–1.2)
Prothrombin Time: 31.8 seconds — ABNORMAL HIGH (ref 11.4–15.2)

## 2018-10-05 LAB — ABO/RH: ABO/RH(D): O POS

## 2018-10-05 LAB — PREPARE RBC (CROSSMATCH)

## 2018-10-05 SURGERY — AMPUTATION BELOW KNEE
Anesthesia: General | Laterality: Right

## 2018-10-05 MED ORDER — METOCLOPRAMIDE HCL 5 MG/ML IJ SOLN: 5.0000 mg | Freq: Three times a day (TID) | INTRAMUSCULAR | Status: DC | PRN

## 2018-10-05 MED ORDER — SODIUM CHLORIDE 0.9 % IV SOLN
INTRAVENOUS | Status: DC
  Administered 2018-10-05: 17:00:00 via INTRAVENOUS

## 2018-10-05 MED ORDER — FUROSEMIDE 40 MG PO TABS
40.0000 mg | ORAL_TABLET | Freq: Every day | ORAL | Status: DC
  Administered 2018-10-05 – 2018-10-09 (×5): 40 mg via ORAL
  Filled 2018-10-05 (×5): qty 1

## 2018-10-05 MED ORDER — TIZANIDINE HCL 4 MG PO TABS
4.0000 mg | ORAL_TABLET | Freq: Two times a day (BID) | ORAL | Status: DC
  Administered 2018-10-05 – 2018-10-10 (×10): 4 mg via ORAL
  Filled 2018-10-05 (×10): qty 1

## 2018-10-05 MED ORDER — ONDANSETRON HCL 4 MG PO TABS: 4.0000 mg | ORAL_TABLET | Freq: Four times a day (QID) | ORAL | Status: DC | PRN

## 2018-10-05 MED ORDER — BENAZEPRIL HCL 20 MG PO TABS
20.0000 mg | ORAL_TABLET | Freq: Every day | ORAL | Status: DC
  Administered 2018-10-06 – 2018-10-10 (×5): 20 mg via ORAL
  Filled 2018-10-05 (×5): qty 1

## 2018-10-05 MED ORDER — MIDAZOLAM HCL 2 MG/2ML IJ SOLN
INTRAMUSCULAR | Status: AC
  Filled 2018-10-05: qty 2

## 2018-10-05 MED ORDER — ONDANSETRON HCL 4 MG/2ML IJ SOLN: 4.0000 mg | Freq: Four times a day (QID) | INTRAMUSCULAR | Status: DC | PRN

## 2018-10-05 MED ORDER — HYDROMORPHONE HCL 1 MG/ML IJ SOLN
0.2500 mg | INTRAMUSCULAR | Status: DC | PRN
  Administered 2018-10-05 (×4): 0.5 mg via INTRAVENOUS

## 2018-10-05 MED ORDER — INSULIN ASPART 100 UNIT/ML ~~LOC~~ SOLN: 0.0000 [IU] | Freq: Three times a day (TID) | SUBCUTANEOUS | Status: DC

## 2018-10-05 MED ORDER — HYDROMORPHONE HCL 1 MG/ML IJ SOLN
INTRAMUSCULAR | Status: AC
  Filled 2018-10-05: qty 1

## 2018-10-05 MED ORDER — TAMSULOSIN HCL 0.4 MG PO CAPS
0.4000 mg | ORAL_CAPSULE | Freq: Every day | ORAL | Status: DC
  Administered 2018-10-06 – 2018-10-10 (×5): 0.4 mg via ORAL
  Filled 2018-10-05 (×5): qty 1

## 2018-10-05 MED ORDER — PHENYLEPHRINE HCL 10 MG/ML IJ SOLN
INTRAMUSCULAR | Status: AC
  Filled 2018-10-05: qty 1

## 2018-10-05 MED ORDER — 0.9 % SODIUM CHLORIDE (POUR BTL) OPTIME
TOPICAL | Status: DC | PRN
  Administered 2018-10-05: 1000 mL

## 2018-10-05 MED ORDER — CHLORHEXIDINE GLUCONATE 4 % EX LIQD: 60.0000 mL | Freq: Once | CUTANEOUS | Status: DC

## 2018-10-05 MED ORDER — MIDAZOLAM HCL 2 MG/2ML IJ SOLN
INTRAMUSCULAR | Status: DC | PRN
  Administered 2018-10-05: 2 mg via INTRAVENOUS

## 2018-10-05 MED ORDER — ACETAMINOPHEN 500 MG PO TABS
1000.0000 mg | ORAL_TABLET | Freq: Once | ORAL | Status: AC
  Administered 2018-10-05: 1000 mg via ORAL
  Filled 2018-10-05: qty 2

## 2018-10-05 MED ORDER — FLUOXETINE HCL 20 MG PO CAPS
20.0000 mg | ORAL_CAPSULE | Freq: Every day | ORAL | Status: DC
  Administered 2018-10-06 – 2018-10-10 (×5): 20 mg via ORAL
  Filled 2018-10-05 (×5): qty 1

## 2018-10-05 MED ORDER — GABAPENTIN 300 MG PO CAPS
300.0000 mg | ORAL_CAPSULE | Freq: Two times a day (BID) | ORAL | Status: DC
  Administered 2018-10-05 – 2018-10-10 (×10): 300 mg via ORAL
  Filled 2018-10-05 (×10): qty 1

## 2018-10-05 MED ORDER — INSULIN PUMP
Freq: Three times a day (TID) | SUBCUTANEOUS | Status: DC
  Administered 2018-10-05 – 2018-10-06 (×3): via SUBCUTANEOUS
  Administered 2018-10-06: 7.45 via SUBCUTANEOUS
  Administered 2018-10-06: 3 via SUBCUTANEOUS
  Administered 2018-10-07 (×2): 7.75 via SUBCUTANEOUS
  Administered 2018-10-08: 8.55 via SUBCUTANEOUS
  Administered 2018-10-08: 6.4 via SUBCUTANEOUS
  Administered 2018-10-08: 22:00:00 via SUBCUTANEOUS
  Administered 2018-10-09: 3.5 via SUBCUTANEOUS
  Administered 2018-10-09 (×3): via SUBCUTANEOUS

## 2018-10-05 MED ORDER — CEFAZOLIN SODIUM-DEXTROSE 2-4 GM/100ML-% IV SOLN
2.0000 g | Freq: Four times a day (QID) | INTRAVENOUS | Status: AC
  Administered 2018-10-05 – 2018-10-06 (×3): 2 g via INTRAVENOUS
  Filled 2018-10-05 (×4): qty 100

## 2018-10-05 MED ORDER — SODIUM CHLORIDE 0.9% IV SOLUTION: Freq: Once | INTRAVENOUS | Status: DC

## 2018-10-05 MED ORDER — ONDANSETRON HCL 4 MG/2ML IJ SOLN
INTRAMUSCULAR | Status: DC | PRN
  Administered 2018-10-05: 4 mg via INTRAVENOUS

## 2018-10-05 MED ORDER — METOCLOPRAMIDE HCL 5 MG PO TABS: 5.0000 mg | ORAL_TABLET | Freq: Three times a day (TID) | ORAL | Status: DC | PRN

## 2018-10-05 MED ORDER — PHENYLEPHRINE HCL 10 MG/ML IJ SOLN
INTRAMUSCULAR | Status: DC | PRN
  Administered 2018-10-05 (×2): 80 ug via INTRAVENOUS

## 2018-10-05 MED ORDER — DULOXETINE HCL 60 MG PO CPEP
60.0000 mg | ORAL_CAPSULE | Freq: Every day | ORAL | Status: DC
  Administered 2018-10-06 – 2018-10-10 (×5): 60 mg via ORAL
  Filled 2018-10-05 (×5): qty 1

## 2018-10-05 MED ORDER — HYDROMORPHONE HCL 1 MG/ML IJ SOLN
INTRAMUSCULAR | Status: AC
  Administered 2018-10-05: 0.5 mg via INTRAVENOUS
  Filled 2018-10-05: qty 1

## 2018-10-05 MED ORDER — LIDOCAINE HCL (CARDIAC) PF 100 MG/5ML IV SOSY
PREFILLED_SYRINGE | INTRAVENOUS | Status: DC | PRN
  Administered 2018-10-05: 100 mg via INTRATRACHEAL

## 2018-10-05 MED ORDER — INSULIN PUMP: SUBCUTANEOUS | Status: DC

## 2018-10-05 MED ORDER — ASPIRIN EC 81 MG PO TBEC
81.0000 mg | DELAYED_RELEASE_TABLET | Freq: Every day | ORAL | Status: DC
  Administered 2018-10-06 – 2018-10-10 (×5): 81 mg via ORAL
  Filled 2018-10-05 (×5): qty 1

## 2018-10-05 MED ORDER — PROPOFOL 10 MG/ML IV BOLUS
INTRAVENOUS | Status: DC | PRN
  Administered 2018-10-05: 120 mg via INTRAVENOUS

## 2018-10-05 MED ORDER — CEFAZOLIN SODIUM-DEXTROSE 2-4 GM/100ML-% IV SOLN
2.0000 g | INTRAVENOUS | Status: AC
  Administered 2018-10-05: 2 g via INTRAVENOUS
  Administered 2018-10-05: 1 g via INTRAVENOUS
  Filled 2018-10-05: qty 100

## 2018-10-05 MED ORDER — MAGNESIUM CITRATE PO SOLN: 1.0000 | Freq: Once | ORAL | Status: DC | PRN

## 2018-10-05 MED ORDER — ATORVASTATIN CALCIUM 80 MG PO TABS
80.0000 mg | ORAL_TABLET | Freq: Every day | ORAL | Status: DC
  Administered 2018-10-06 – 2018-10-10 (×5): 80 mg via ORAL
  Filled 2018-10-05 (×5): qty 1

## 2018-10-05 MED ORDER — SODIUM CHLORIDE 0.9 % IV SOLN
INTRAVENOUS | Status: DC
  Administered 2018-10-05 (×2): via INTRAVENOUS

## 2018-10-05 MED ORDER — LEVOTHYROXINE SODIUM 100 MCG PO TABS
100.0000 ug | ORAL_TABLET | Freq: Every day | ORAL | Status: DC
  Administered 2018-10-06 – 2018-10-10 (×5): 100 ug via ORAL
  Filled 2018-10-05 (×5): qty 1

## 2018-10-05 MED ORDER — BISACODYL 10 MG RE SUPP: 10.0000 mg | Freq: Every day | RECTAL | Status: DC | PRN

## 2018-10-05 MED ORDER — FENTANYL CITRATE (PF) 250 MCG/5ML IJ SOLN
INTRAMUSCULAR | Status: AC
  Filled 2018-10-05: qty 5

## 2018-10-05 MED ORDER — METHOCARBAMOL 1000 MG/10ML IJ SOLN
500.0000 mg | Freq: Four times a day (QID) | INTRAVENOUS | Status: DC | PRN
  Filled 2018-10-05: qty 5

## 2018-10-05 MED ORDER — HYDROMORPHONE HCL 1 MG/ML IJ SOLN
0.5000 mg | INTRAMUSCULAR | Status: DC | PRN
  Administered 2018-10-06 – 2018-10-08 (×3): 1 mg via INTRAVENOUS
  Filled 2018-10-05 (×3): qty 1

## 2018-10-05 MED ORDER — WARFARIN - PHARMACIST DOSING INPATIENT
Freq: Every day | Status: DC
  Administered 2018-10-06 – 2018-10-08 (×2)

## 2018-10-05 MED ORDER — MIDODRINE HCL 5 MG PO TABS
5.0000 mg | ORAL_TABLET | Freq: Three times a day (TID) | ORAL | Status: DC
  Administered 2018-10-05 – 2018-10-10 (×14): 5 mg via ORAL
  Filled 2018-10-05 (×14): qty 1

## 2018-10-05 MED ORDER — FENOFIBRATE 54 MG PO TABS
54.0000 mg | ORAL_TABLET | Freq: Every day | ORAL | Status: DC
  Administered 2018-10-06 – 2018-10-10 (×5): 54 mg via ORAL
  Filled 2018-10-05 (×5): qty 1

## 2018-10-05 MED ORDER — ACETAMINOPHEN 325 MG PO TABS
325.0000 mg | ORAL_TABLET | Freq: Four times a day (QID) | ORAL | Status: DC | PRN
  Administered 2018-10-06: 325 mg via ORAL
  Administered 2018-10-07 – 2018-10-08 (×2): 650 mg via ORAL
  Filled 2018-10-05 (×2): qty 2
  Filled 2018-10-05: qty 1
  Filled 2018-10-05: qty 2

## 2018-10-05 MED ORDER — FUROSEMIDE 40 MG PO TABS
80.0000 mg | ORAL_TABLET | Freq: Every day | ORAL | Status: DC
  Administered 2018-10-06 – 2018-10-10 (×5): 80 mg via ORAL
  Filled 2018-10-05 (×5): qty 2

## 2018-10-05 MED ORDER — LIRAGLUTIDE 18 MG/3ML ~~LOC~~ SOPN: 1.8000 mg | PEN_INJECTOR | SUBCUTANEOUS | Status: DC

## 2018-10-05 MED ORDER — METHOCARBAMOL 500 MG PO TABS
500.0000 mg | ORAL_TABLET | Freq: Four times a day (QID) | ORAL | Status: DC | PRN
  Administered 2018-10-05 – 2018-10-09 (×5): 500 mg via ORAL
  Filled 2018-10-05 (×6): qty 1

## 2018-10-05 MED ORDER — FENTANYL CITRATE (PF) 250 MCG/5ML IJ SOLN
INTRAMUSCULAR | Status: DC | PRN
  Administered 2018-10-05 (×2): 50 ug via INTRAVENOUS
  Administered 2018-10-05: 25 ug via INTRAVENOUS

## 2018-10-05 MED ORDER — POLYETHYLENE GLYCOL 3350 17 G PO PACK
17.0000 g | PACK | Freq: Every day | ORAL | Status: DC | PRN
  Administered 2018-10-07: 17 g via ORAL
  Filled 2018-10-05: qty 1

## 2018-10-05 MED ORDER — OXYCODONE HCL 5 MG PO TABS
5.0000 mg | ORAL_TABLET | ORAL | Status: DC | PRN
  Administered 2018-10-06 – 2018-10-09 (×3): 10 mg via ORAL
  Filled 2018-10-05 (×9): qty 2

## 2018-10-05 MED ORDER — DOCUSATE SODIUM 100 MG PO CAPS
100.0000 mg | ORAL_CAPSULE | Freq: Two times a day (BID) | ORAL | Status: DC
  Administered 2018-10-05 – 2018-10-10 (×9): 100 mg via ORAL
  Filled 2018-10-05 (×10): qty 1

## 2018-10-05 MED ORDER — INSULIN ASPART 100 UNIT/ML ~~LOC~~ SOLN: 4.0000 [IU] | Freq: Three times a day (TID) | SUBCUTANEOUS | Status: DC

## 2018-10-05 MED ORDER — MECLIZINE HCL 25 MG PO TABS
25.0000 mg | ORAL_TABLET | Freq: Three times a day (TID) | ORAL | Status: DC | PRN
  Filled 2018-10-05: qty 1

## 2018-10-05 MED ORDER — WARFARIN SODIUM 2 MG PO TABS
2.0000 mg | ORAL_TABLET | Freq: Once | ORAL | Status: AC
  Administered 2018-10-05: 2 mg via ORAL
  Filled 2018-10-05: qty 1

## 2018-10-05 MED ORDER — OXYCODONE HCL 5 MG PO TABS
10.0000 mg | ORAL_TABLET | ORAL | Status: DC | PRN
  Administered 2018-10-05 (×2): 10 mg via ORAL
  Administered 2018-10-07 (×2): 15 mg via ORAL
  Administered 2018-10-07 – 2018-10-08 (×4): 10 mg via ORAL
  Administered 2018-10-09 – 2018-10-10 (×2): 15 mg via ORAL
  Filled 2018-10-05 (×5): qty 3

## 2018-10-05 SURGICAL SUPPLY — 36 items
BLADE SAW RECIP 87.9 MT (BLADE) ×2 IMPLANT
BLADE SURG 21 STRL SS (BLADE) ×2 IMPLANT
BNDG COHESIVE 6X5 TAN STRL LF (GAUZE/BANDAGES/DRESSINGS) ×2 IMPLANT
CANISTER WOUND CARE 500ML ATS (WOUND CARE) ×3 IMPLANT
COVER SURGICAL LIGHT HANDLE (MISCELLANEOUS) ×2 IMPLANT
COVER WAND RF STERILE (DRAPES) ×2 IMPLANT
CUFF TOURNIQUET SINGLE 34IN LL (TOURNIQUET CUFF) ×2 IMPLANT
DRAPE INCISE IOBAN 66X45 STRL (DRAPES) IMPLANT
DRAPE U-SHAPE 47X51 STRL (DRAPES) ×2 IMPLANT
DRESSING PREVENA PLUS CUSTOM (GAUZE/BANDAGES/DRESSINGS) ×1 IMPLANT
DRSG PREVENA PLUS CUSTOM (GAUZE/BANDAGES/DRESSINGS) ×4
DURAPREP 26ML APPLICATOR (WOUND CARE) ×2 IMPLANT
ELECT REM PT RETURN 9FT ADLT (ELECTROSURGICAL) ×2
ELECTRODE REM PT RTRN 9FT ADLT (ELECTROSURGICAL) ×1 IMPLANT
GLOVE BIOGEL PI IND STRL 9 (GLOVE) ×1 IMPLANT
GLOVE BIOGEL PI INDICATOR 9 (GLOVE) ×1
GLOVE SURG ORTHO 9.0 STRL STRW (GLOVE) ×2 IMPLANT
GOWN STRL REUS W/ TWL XL LVL3 (GOWN DISPOSABLE) ×2 IMPLANT
GOWN STRL REUS W/TWL XL LVL3 (GOWN DISPOSABLE) ×4
KIT BASIN OR (CUSTOM PROCEDURE TRAY) ×2 IMPLANT
KIT TURNOVER KIT B (KITS) ×2 IMPLANT
MANIFOLD NEPTUNE II (INSTRUMENTS) ×2 IMPLANT
NS IRRIG 1000ML POUR BTL (IV SOLUTION) ×2 IMPLANT
PACK ORTHO EXTREMITY (CUSTOM PROCEDURE TRAY) ×2 IMPLANT
PAD ARMBOARD 7.5X6 YLW CONV (MISCELLANEOUS) ×2 IMPLANT
PREVENA RESTOR ARTHOFORM 46X30 (CANNISTER) ×3 IMPLANT
SPONGE LAP 18X18 RF (DISPOSABLE) IMPLANT
STAPLER VISISTAT 35W (STAPLE) IMPLANT
STOCKINETTE IMPERVIOUS LG (DRAPES) ×2 IMPLANT
SUT ETHILON 2 0 PSLX (SUTURE) IMPLANT
SUT SILK 2 0 (SUTURE) ×2
SUT SILK 2-0 18XBRD TIE 12 (SUTURE) ×1 IMPLANT
SUT VIC AB 1 CTX 27 (SUTURE) ×4 IMPLANT
TOWEL OR 17X26 10 PK STRL BLUE (TOWEL DISPOSABLE) ×2 IMPLANT
TUBE CONNECTING 12X1/4 (SUCTIONS) ×2 IMPLANT
YANKAUER SUCT BULB TIP NO VENT (SUCTIONS) ×2 IMPLANT

## 2018-10-05 NOTE — Telephone Encounter (Signed)
Please advise 

## 2018-10-05 NOTE — Telephone Encounter (Signed)
The hospital doctor can call us for guidance but we do not go there

## 2018-10-05 NOTE — Progress Notes (Addendum)
Inpatient Diabetes Program Recommendations  AACE/ADA: New Consensus Statement on Inpatient Glycemic Control (2015)  Target Ranges:  Prepandial:   less than 140 mg/dL      Peak postprandial:   less than 180 mg/dL (1-2 hours)      Critically ill patients:  140 - 180 mg/dL   Lab Results  Component Value Date   GLUCAP 164 (H) 10/05/2018   HGBA1C 6.9 (A) 06/15/2018    Review of Glycemic Control  Diabetes history: Type 2 Outpatient Diabetes medications: Omnipod with U-500 insulin 8 units/hour. Boluses per blood sugar. Current orders for Inpatient glycemic control: Insulin pump, Novolog MODERATE correction scale TID & HS, Novolog 4 units TID  Inpatient Diabetes Program Recommendations:   Received diabetes coordinator consult. Noted that patient was just discharged on 09/27/18. Is on Omnipod insulin pump with U-500 insulin with basal rates of 8 units/hour. This is decreased because of patient having low blood sugars prior to admission. Spoke with patient about his pump. States that he has supplies and insulin at bedside.   Spoke with staff RN in regards to orders for insulin. Recommend that SQ insulin orders be discontinued if patient is using insulin pump. Patient needs to be alert to be able to regulate own pump, will need to sign insulin pump patient contract, and will tell staff RN what boluses of insulin are given for meals. Will continue to monitor blood sugars while in the hospital.  Harvel Ricks RN BSN CDE Diabetes Coordinator Pager: [phone]  8am-5pm

## 2018-10-05 NOTE — Anesthesia Preprocedure Evaluation (Signed)
Anesthesia Evaluation  Patient identified by MRN, date of birth, ID band Patient awake    Reviewed: Allergy & Precautions, H&P , NPO status , Patient's Chart, lab work & pertinent test results, reviewed documented beta blocker date and time   Airway Mallampati: II  TM Distance: >3 FB Neck ROM: Full    Dental no notable dental hx. (+) Partial Lower, Partial Upper, Dental Advisory Given   Pulmonary sleep apnea and Continuous Positive Airway Pressure Ventilation , former smoker,    Pulmonary exam normal breath sounds clear to auscultation       Cardiovascular hypertension, Pt. on medications + pacemaker + Cardiac Defibrillator  Rhythm:Regular Rate:Normal     Neuro/Psych Depression negative neurological ROS     GI/Hepatic negative GI ROS, Neg liver ROS,   Endo/Other  diabetes, Type 1, Insulin DependentHypothyroidism Morbid obesity  Renal/GU Renal InsufficiencyRenal disease  negative genitourinary   Musculoskeletal  (+) Arthritis , Osteoarthritis,    Abdominal   Peds  Hematology negative hematology ROS (+)   Anesthesia Other Findings   Reproductive/Obstetrics negative OB ROS                             Anesthesia Physical Anesthesia Plan  ASA: III  Anesthesia Plan: General   Post-op Pain Management:    Induction: Intravenous  PONV Risk Score and Plan: 3 and Ondansetron, Midazolam, Treatment may vary due to age or medical condition and Dexamethasone  Airway Management Planned: LMA  Additional Equipment:   Intra-op Plan:   Post-operative Plan: Extubation in OR  Informed Consent: I have reviewed the patients History and Physical, chart, labs and discussed the procedure including the risks, benefits and alternatives for the proposed anesthesia with the patient or authorized representative who has indicated his/her understanding and acceptance.     Dental advisory given  Plan  Discussed with: CRNA  Anesthesia Plan Comments:         Anesthesia Quick Evaluation

## 2018-10-05 NOTE — Progress Notes (Signed)
Orthopedic Tech Progress Note Patient Details:  Ethan Campbell 1960/10/29 [id number]  Patient ID: Albin Fischer, male   DOB: 12/28/1960, 58 y.o.   MRN: [id number] Called in order to bio tech.  Karolee Stamps 10/05/2018, 2:10 PM

## 2018-10-05 NOTE — Progress Notes (Signed)
Inpatient Rehabilitation Admissions Coordinator  Inpatient Rehab Consult received. OR today. I will follow up on Monday.  Danne Baxter, RN, MSN Rehab Admissions Coordinator [phone] 10/05/2018 3:56 PM

## 2018-10-05 NOTE — H&P (Signed)
Ethan Campbell is an 58 y.o. male.   Chief Complaint: Gangrene right foot HPI: The patient is a 58 year old gentleman who underwent right first ray and right fifth ray amputations for gangrene on 09/26/2018.  He has failed to heal and presents with progressive gangrene of the foot.  He is chronically anticoagulated on warfarin.  He has triphasic pulses in the right foot by Doppler today but despite this he has had progressive ischemic changes over the operative sites as well as a new area of ulceration/ischemia over the posterior ankle over the Achilles tendon.   Treatment options were discussed with the patient to improve his microcirculation including treatment with nitroglycerin patches and Trental to improve his blood flow to the foot versus proceeding with a below the knee amputation as this would provide him a better chance at healing and a more functional residual limb.  The patient reports he has has continued to fill generally sick and fatigued following the attempts at limb salvage and would like to proceed with a below the knee amputation at this time.   Past Medical History:  Diagnosis Date  . Arm vein blood clot, left    on Coumadin  . Arthritis   . Cancer ([place])    Bone cancer 1987 (in knee Large cell tumor)  . Chronic kidney disease    stage 3  . Depression   . Essential hypertension   . History of stroke   . Hyperlipidemia   . Hypothyroidism   . Insomnia   . Obesity   . Precordial pain June 2011   Nuclear stress; no ischemia; EF 60%  . Presence of permanent cardiac pacemaker   . Proteinuria   . Seizure disorder ([place])   . Sick sinus syndrome ([place])   . Sleep apnea    Dr. Brandon Melnick, uses bipap  . Syncope   . Type 2 diabetes mellitus ([place])   . Venous insufficiency     Past Surgical History:  Procedure Laterality Date  . AMPUTATION Right 06/20/2018   Procedure: RIGHT GREAT TOE AMPUTATION;  Surgeon: Newt Minion, MD;  Location: [facility];  Service: Orthopedics;  Laterality:  Right;  . AMPUTATION Right 09/26/2018   Procedure: RIGHT FOOT 5TH RAY AMPUTATION;  Surgeon: Newt Minion, MD;  Location: [facility];  Service: Orthopedics;  Laterality: Right;  MAC and regional anesthesia  . COLONOSCOPY    . RESECTION BONE TUMOR FEMUR  1980's   Left femur, [facility] with bone graft  . TOTAL KNEE ARTHROPLASTY Left 2013    Family History  Problem Relation Age of Onset  . Heart attack Mother 19  . Breast cancer Mother   . Stroke Mother   . Heart attack Father 37  . Breast cancer Sister   . Colon cancer Sister    Social History:  reports that he has quit smoking. His smoking use included cigarettes. He has never used smokeless tobacco. He reports that he does not drink alcohol or use drugs.  Allergies: No Known Allergies  No medications prior to admission.    No results found for this or any previous visit (from the past 48 hour(s)). No results found.  Review of Systems  All other systems reviewed and are negative.   There were no vitals taken for this visit. Physical Exam  Constitutional: He is oriented to person, place, and time. He appears well-developed and well-nourished.  HENT:  Head: Normocephalic and atraumatic.  Neck: No tracheal deviation present. No thyromegaly present.  Cardiovascular: Normal rate.  Respiratory: Effort normal. No stridor. No respiratory distress.  GI: Soft. He exhibits no distension.  Musculoskeletal:     Comments: There is dehiscence of the lateral foot incision and new necrosis of the superior incisional wound bed.  There are some sutures that have pulled through.  There is no significant drainage from the area but significant dehiscence.  The medial first ray amputation site is also dehiscing.  There are now gangrenous changes over both of these areas as well as a new dark gangrenous area over the posterior ankle over the Achilles tendon.  There are palpable pulses and he has a triphasic dorsalis pedis pulse by Doppler.    Neurological: He is alert and oriented to person, place, and time. No cranial nerve deficit.  Skin: Skin is warm.  Psychiatric: He has a normal mood and affect. His behavior is normal. Judgment and thought content normal.     Assessment/Plan Progressive gangrene of right foot with dehiscence of previous amputation sites- plan right transtibial amputation-the procedure and risks and benefits, including the risks of bleeding, infection, neurovascular injury, and possible need for further surgery were discussed at length with the patient and his family.  The patient's and family's questions were answered to their satisfaction and the patient wishes to proceed with right transtibial amputation at this time.  Erlinda Hong, PA-C 10/05/2018, 7:11 AM [facility] [phone]

## 2018-10-05 NOTE — Anesthesia Postprocedure Evaluation (Signed)
Anesthesia Post Note  Patient: Natnael Biederman Markson  Procedure(s) Performed: RIGHT BELOW KNEE AMPUTATION (Right )     Patient location during evaluation: PACU Anesthesia Type: General Level of consciousness: awake and alert Pain management: pain level controlled Vital Signs Assessment: post-procedure vital signs reviewed and stable Respiratory status: spontaneous breathing, nonlabored ventilation, respiratory function stable and patient connected to nasal cannula oxygen Cardiovascular status: blood pressure returned to baseline and stable Postop Assessment: no apparent nausea or vomiting Anesthetic complications: no    Last Vitals:  Vitals:   10/05/18 1345 10/05/18 1400  BP: (!) 164/78 (!) 168/83  Pulse: 75 77  Resp: 14 14  Temp: 36.5 C   SpO2: 94% 98%    Last Pain:  Vitals:   10/05/18 1345  TempSrc:   PainSc: Asleep                 Leeza Heiner,W. EDMOND

## 2018-10-05 NOTE — Anesthesia Procedure Notes (Signed)
Procedure Name: LMA Insertion Date/Time: 10/05/2018 11:45 AM Performed by: Kathryne Hitch, CRNA Pre-anesthesia Checklist: Patient identified, Emergency Drugs available, Suction available and Patient being monitored Patient Re-evaluated:Patient Re-evaluated prior to induction Oxygen Delivery Method: Circle system utilized Preoxygenation: Pre-oxygenation with 100% oxygen Induction Type: IV induction Ventilation: Mask ventilation without difficulty LMA: LMA inserted LMA Size: 5.0 Number of attempts: 1 Placement Confirmation: breath sounds checked- equal and bilateral and positive ETCO2 Tube secured with: Tape Dental Injury: Teeth and Oropharynx as per pre-operative assessment

## 2018-10-05 NOTE — Telephone Encounter (Signed)
Patients wife has called to inform that the patient is currently in the hospital due to having his right foot amputated. Patients wife is wondering if there is any way Dr.Kumar can help until patient goes through recovery and rehab.  Please Advise,  Thanks

## 2018-10-05 NOTE — Progress Notes (Signed)
ANTICOAGULATION CONSULT NOTE - Initial Consult  Pharmacy Consult:  Coumadin Indication: Afib  No Known Allergies  Patient Measurements: Height: 5\' 9"  (175.3 cm) Weight: (!) 309 lb 15.5 oz (140.6 kg) IBW/kg (Calculated) : 70.7  Vital Signs: Temp: 98.1 F (36.7 C) (03/13 1420) Temp Source: Oral (03/13 1420) BP: 178/70 (03/13 1420) Pulse Rate: 77 (03/13 1420)  Labs: Recent Labs    10/05/18 1610  LABPROT 31.8*  INR 3.1*    Estimated Creatinine Clearance: 48.2 mL/min (A) (by C-G formula based on SCr of 2.36 mg/dL (H)).   Medical History: Past Medical History:  Diagnosis Date  . Arm vein blood clot, left    on Coumadin  . Arthritis   . Cancer ([place])    Bone cancer 1987 (in knee Large cell tumor)  . Chronic kidney disease    stage 3  . Depression   . Essential hypertension   . History of stroke   . Hyperlipidemia   . Hypothyroidism   . Insomnia   . Obesity   . Precordial pain June 2011   Nuclear stress; no ischemia; EF 60%  . Presence of permanent cardiac pacemaker   . Proteinuria   . Seizure disorder ([place])   . Sick sinus syndrome ([place])   . Sleep apnea    Dr. Brandon Melnick, uses bipap  . Syncope   . Type 2 diabetes mellitus ([place])   . Venous insufficiency      Assessment: 34 YOM admitted for transtibial amputation of right foot due to gangrene.  Pharmacy consulted to dose Coumadin for history of Afib.  He also has a history of LUE DVT in 2017.  INR slightly supra-therapeutic at 3.1 today.  His last Coumadin dose was on 10/03/18.  Given bleeding risk post amputation and elevated INR, will be conservative with tonight's dosing although he is on a respectable dose PTA.  Home Coumadin regimen: 7.5mg  PO daily except 10mg  on Mon and Fri  Goal of Therapy:  INR 2-3 Monitor platelets by anticoagulation protocol: Yes   Plan:  Coumadin 2mg  PO today Daily PT / INR Monitor closely for s/sx of bleeding   Sion Thane D. Mina Marble, PharmD, BCPS, [place] 10/05/2018, 5:21 PM

## 2018-10-05 NOTE — Transfer of Care (Signed)
Immediate Anesthesia Transfer of Care Note  Patient: Ethan Campbell  Procedure(s) Performed: RIGHT BELOW KNEE AMPUTATION (Right )  Patient Location: PACU  Anesthesia Type:General  Level of Consciousness: awake, alert  and oriented  Airway & Oxygen Therapy: Patient Spontanous Breathing and Patient connected to face mask oxygen  Post-op Assessment: Report given to RN and Post -op Vital signs reviewed and stable  Post vital signs: Reviewed and stable  Last Vitals:  Vitals Value Taken Time  BP 163/70   Temp    Pulse 74 10/05/2018 12:23 PM  Resp 16   SpO2 100 % 10/05/2018 12:23 PM  Vitals shown include unvalidated device data.  Last Pain:  Vitals:   10/05/18 1006  TempSrc:   PainSc: 0-No pain         Complications: No apparent anesthesia complications

## 2018-10-05 NOTE — Op Note (Signed)
   Date of Surgery: 10/05/2018  INDICATIONS: Mr. Heindl is a 58 y.o.-year-old male who is status post right foot salvage intervention.  Despite patient having a strong dorsalis pedis pulse with a triphasic waveform at the ankle patient has progressive gangrenous changes over the lateral column of the right foot a new ischemic ulcer over the Achilles with nonhealing of the first ray amputation.  With the progressive gangrenous changes limb salvage versus transtibial amputation was discussed with the patient and he wishes to proceed with a transtibial amputation.Marland Kitchen  PREOPERATIVE DIAGNOSIS: Gangrene right foot  POSTOPERATIVE DIAGNOSIS: Same.  PROCEDURE: Transtibial amputation Application of Prevena wound VAC  SURGEON: Sharol Given, M.D.  ANESTHESIA:  general  IV FLUIDS AND URINE: See anesthesia.  ESTIMATED BLOOD LOSS: 200 mL.  COMPLICATIONS: None.  DESCRIPTION OF PROCEDURE: The patient was brought to the operating room and underwent a general anesthetic. After adequate levels of anesthesia were obtained patient's lower extremity was prepped using DuraPrep draped into a sterile field. A timeout was called. The foot was draped out of the sterile field with impervious stockinette. A transverse incision was made 11 cm distal to the tibial tubercle. This curved proximally and a large posterior flap was created. The tibia was transected 1 cm proximal to the skin incision. The fibula was transected just proximal to the tibial incision. The tibia was beveled anteriorly. A large posterior flap was created. The sciatic nerve was pulled cut and allowed to retract. The vascular bundles were suture ligated with 2-0 silk. The deep and superficial fascial layers were closed using #1 Vicryl. The skin was closed using staples and 2-0 nylon. The wound was covered with a Prevena restore and customizable wound VAC. There was a good suction fit. A prosthetic shrinker will be applied. Patient was extubated taken to the PACU  in stable condition.   DISCHARGE PLANNING:  Antibiotic duration: 24 hours  Weightbearing: Nonweightbearing on the right  Pain medication: High-dose opioid pathway  Dressing care/ Wound VAC: Continue wound VAC for 1 week  Discharge to: Anticipate discharge to inpatient versus outpatient rehab.  Follow-up: In the office 1 week post operative.  Meridee Score, MD [place] 12:33 PM

## 2018-10-05 NOTE — Progress Notes (Signed)
Orthopedic Tech Progress Note Patient Details:  Ethan Campbell [birth year] [id number]  Patient ID: Ethan Campbell, male   DOB: February 17, 1961, 58 y.o.   MRN: [id number] Called in order to [place] 10/05/2018, 2:57 PM

## 2018-10-06 ENCOUNTER — Encounter (HOSPITAL_COMMUNITY): Payer: Self-pay | Admitting: Orthopedic Surgery

## 2018-10-06 LAB — GLUCOSE, CAPILLARY
GLUCOSE-CAPILLARY: 81 mg/dL (ref 70–99)
GLUCOSE-CAPILLARY: 115 mg/dL — AB (ref 70–99)
Glucose-Capillary: 154 mg/dL — ABNORMAL HIGH (ref 70–99)
Glucose-Capillary: 161 mg/dL — ABNORMAL HIGH (ref 70–99)
Glucose-Capillary: 197 mg/dL — ABNORMAL HIGH (ref 70–99)
Glucose-Capillary: 45 mg/dL — ABNORMAL LOW (ref 70–99)
Glucose-Capillary: 54 mg/dL — ABNORMAL LOW (ref 70–99)
Glucose-Capillary: 96 mg/dL (ref 70–99)

## 2018-10-06 LAB — PROTIME-INR
INR: 2.7 — ABNORMAL HIGH (ref 0.8–1.2)
Prothrombin Time: 28.6 seconds — ABNORMAL HIGH (ref 11.4–15.2)

## 2018-10-06 MED ORDER — WARFARIN SODIUM 5 MG PO TABS
5.0000 mg | ORAL_TABLET | Freq: Once | ORAL | Status: AC
  Administered 2018-10-06: 5 mg via ORAL
  Filled 2018-10-06: qty 1

## 2018-10-06 NOTE — Evaluation (Signed)
Occupational Therapy Evaluation Patient Details Name: Ethan Campbell MRN: [id number] DOB: 1961-06-13 Today's Date: 10/06/2018    History of Present Illness 58 yo male admitted for gangrene of R foot and underwent R BKA on 3/13. PMH: DM11, depression, bone cancer, hypothyroidism, arthritis. PSH: amputation of R great toe 06/20/2018, R 5th ray 09/26/2018. Resection of bone tumor in L femur, L TKA..   Clinical Impression   PTA, pt was independent with ADL and functional mobility. He currently requires min assist for toilet transfers with RW and mod assist for LB ADL. He is limited by decreased activity tolerance but demonstrates good potential for progress toward independence. He would benefit from continued OT services while admitted to improve independence and safety with ADL and functional mobility. He would benefit from home health OT follow-up post-acute D/C and 24 hour assistance from his girlfriend. OT will continue to follow while admitted.     Follow Up Recommendations  Home health OT;Supervision/Assistance - 24 hour    Equipment Recommendations  Tub/shower bench    Recommendations for Other Services       Precautions / Restrictions Precautions Precautions: Fall Precaution Comments: RLE splint to maintain knee extension(limb guard falling off on arrivalOT assisted to reposition) Restrictions Weight Bearing Restrictions: Yes RLE Weight Bearing: Non weight bearing      Mobility Bed Mobility Overal bed mobility: Modified Independent                Transfers Overall transfer level: Needs assistance Equipment used: Rolling walker (2 wheeled) Transfers: Sit to/from Stand Sit to Stand: Min assist         General transfer comment: Hands on assist during transition; multiple slow attempts    Balance Overall balance assessment: Needs assistance Sitting-balance support: Feet supported;No upper extremity supported Sitting balance-Leahy Scale: Good     Standing  balance support: Bilateral upper extremity supported Standing balance-Leahy Scale: Poor Standing balance comment: dependent on bilat UEs due to R LE BKA                           ADL either performed or assessed with clinical judgement   ADL Overall ADL's : Needs assistance/impaired Eating/Feeding: Set up;Sitting   Grooming: Set up;Sitting   Upper Body Bathing: Set up;Sitting   Lower Body Bathing: Moderate assistance;Sit to/from stand   Upper Body Dressing : Set up;Sitting   Lower Body Dressing: Moderate assistance;Sit to/from stand Lower Body Dressing Details (indicate cue type and reason): able to doff socks but unable to don Toilet Transfer: Minimal assistance;Stand-pivot;RW Toilet Transfer Details (indicate cue type and reason): taking pivotal steps from chair to bed Toileting- Clothing Manipulation and Hygiene: Sit to/from stand;Moderate assistance       Functional mobility during ADLs: Minimal assistance;Rolling walker General ADL Comments: Pt educated concerning sensory input to residual limb. Educated concerning need for frequent mobility.      Vision Patient Visual Report: No change from baseline Vision Assessment?: No apparent visual deficits     Perception     Praxis      Pertinent Vitals/Pain Pain Assessment: Faces Faces Pain Scale: Hurts little more Pain Location: R foot Pain Descriptors / Indicators: Discomfort;Guarding Pain Intervention(s): Limited activity within patient's tolerance;Monitored during session;Repositioned     Hand Dominance Right   Extremity/Trunk Assessment Upper Extremity Assessment Upper Extremity Assessment: Overall WFL for tasks assessed   Lower Extremity Assessment Lower Extremity Assessment: RLE deficits/detail RLE Deficits / Details: hip and knee WFL RLE  Sensation: decreased light touch;history of peripheral neuropathy LLE Sensation: history of peripheral neuropathy   Cervical / Trunk Assessment Cervical /  Trunk Assessment: Normal   Communication Communication Communication: No difficulties   Cognition Arousal/Alertness: Awake/alert Behavior During Therapy: WFL for tasks assessed/performed Overall Cognitive Status: Within Functional Limits for tasks assessed                                     General Comments       Exercises     Shoulder Instructions      Home Living Family/patient expects to be discharged to:: Private residence Living Arrangements: Spouse/significant other Available Help at Discharge: Family;Available 24 hours/day Type of Home: Mobile home Home Access: Ramped entrance     Home Layout: One level     Bathroom Shower/Tub: Teacher, early years/pre: Standard Bathroom Accessibility: Yes(via walker)   Home Equipment: Walker - 4 wheels;Bedside commode;Adaptive equipment Adaptive Equipment: Reacher;Sock aid        Prior Functioning/Environment Level of Independence: Independent                 OT Problem List: Decreased strength;Decreased range of motion;Decreased activity tolerance;Impaired balance (sitting and/or standing);Decreased safety awareness;Decreased knowledge of use of DME or AE;Decreased knowledge of precautions;Pain      OT Treatment/Interventions: Self-care/ADL training;Therapeutic exercise;Energy conservation;DME and/or AE instruction;Therapeutic activities;Patient/family education;Balance training    OT Goals(Current goals can be found in the care plan section) Acute Rehab OT Goals Patient Stated Goal: go home soon OT Goal Formulation: With patient Time For Goal Achievement: 10/20/18 Potential to Achieve Goals: Good ADL Goals Pt Will Perform Grooming: with supervision;standing Pt Will Perform Lower Body Dressing: with modified independence;with adaptive equipment;sit to/from stand Pt Will Transfer to Toilet: with modified independence;ambulating;regular height toilet;bedside commode Pt Will Perform  Toileting - Clothing Manipulation and hygiene: with modified independence;sit to/from stand  OT Frequency: Min 2X/week   Barriers to D/C:            Co-evaluation              AM-PAC OT "6 Clicks" Daily Activity     Outcome Measure Help from another person eating meals?: None Help from another person taking care of personal grooming?: None Help from another person toileting, which includes using toliet, bedpan, or urinal?: A Lot Help from another person bathing (including washing, rinsing, drying)?: A Lot Help from another person to put on and taking off regular upper body clothing?: None Help from another person to put on and taking off regular lower body clothing?: A Lot 6 Click Score: 18   End of Session Equipment Utilized During Treatment: Rolling walker(limb guard)  Activity Tolerance: Patient tolerated treatment well Patient left: in bed;with call bell/phone within reach  OT Visit Diagnosis: Other abnormalities of gait and mobility (R26.89);Muscle weakness (generalized) (M62.81);Pain Pain - Right/Left: Right Pain - part of body: Leg(residual limb)                Time: 1425-1440 OT Time Calculation (min): 15 min Charges:  OT General Charges $OT Visit: 1 Visit OT Evaluation $OT Eval Moderate Complexity: [place] A Shonta Bourque 10/06/2018, 3:15 PM

## 2018-10-06 NOTE — Progress Notes (Signed)
ANTICOAGULATION CONSULT NOTE Pharmacy Consult:  Coumadin Indication: Afib  No Known Allergies  Patient Measurements: Height: 5\' 9"  (175.3 cm) Weight: (!) 309 lb 15.5 oz (140.6 kg) IBW/kg (Calculated) : 70.7  Vital Signs: Temp: 97.4 F (36.3 C) (03/14 0831) Temp Source: Oral (03/14 0831) BP: 163/81 (03/14 0831) Pulse Rate: 86 (03/14 0831)  Labs: Recent Labs    10/05/18 1610 10/06/18 0406  LABPROT 31.8* 28.6*  INR 3.1* 2.7*    Estimated Creatinine Clearance: 48.2 mL/min (A) (by C-G formula based on SCr of 2.36 mg/dL (H)).   Assessment: 89 YOM admitted for transtibial amputation of right foot due to gangrene.  Pharmacy consulted to dose Coumadin for history of Afib.  He also has a history of LUE DVT in 2017.  INR slightly supra-therapeutic at 3.1 today.  His last Coumadin dose was on 10/03/18.    INR today = 2.7  Home Coumadin regimen: 7.5mg  PO daily except 10mg  on Mon and Fri  Goal of Therapy:  INR 2-3 Monitor platelets by anticoagulation protocol: Yes   Plan:  Coumadin 5 mg po x 1 today Daily PT / INR Monitor closely for s/sx of bleeding  Thank you Anette Guarneri, PharmD [phone]  10/06/2018, 11:25 AM

## 2018-10-06 NOTE — Plan of Care (Signed)
  Problem: Education: Goal: Knowledge of General Education information will improve Description Including pain rating scale, medication(s)/side effects and non-pharmacologic comfort measures Outcome: Progressing   Problem: Clinical Measurements: Goal: Will remain free from infection Outcome: Progressing   Problem: Clinical Measurements: Goal: Ability to maintain clinical measurements within normal limits will improve Outcome: Progressing   Problem: Activity: Goal: Risk for activity intolerance will decrease Outcome: Progressing   Problem: Pain Managment: Goal: General experience of comfort will improve Outcome: Progressing   Problem: Safety: Goal: Ability to remain free from injury will improve Outcome: Progressing   Problem: Skin Integrity: Goal: Risk for impaired skin integrity will decrease Outcome: Progressing

## 2018-10-06 NOTE — Progress Notes (Signed)
Pt CBG repeat value of 54. Pt remains A+Ox4 and asymptomatic. Given 8 oz of juice. Repeat CBG now 81. Pt upright in bed eating breakfast. Held am novolg d/t low parameters.

## 2018-10-06 NOTE — Progress Notes (Signed)
   Subjective: 1 Day Post-Op Procedure(s) (LRB): RIGHT BELOW KNEE AMPUTATION (Right) Patient reports pain as mild.    Objective: Vital signs in last 24 hours: Temp:  [97.4 F (36.3 C)-100 F (37.8 C)] 97.4 F (36.3 C) (03/14 0831) Pulse Rate:  [75-87] 86 (03/14 0831) Resp:  [12-16] 16 (03/14 0831) BP: (163-193)/(70-91) 163/81 (03/14 0831) SpO2:  [94 %-100 %] 98 % (03/14 0831)  Intake/Output from previous day: 03/13 0701 - 03/14 0700 In: 913.7 [P.O.:150; I.V.:563.7; IV Piggyback:200] Out: 950 [Urine:900; Blood:50] Intake/Output this shift: Total I/O In: 360 [P.O.:360] Out: 450 [Urine:450]  No results for input(s): HGB in the last 72 hours. No results for input(s): WBC, RBC, HCT, PLT in the last 72 hours. No results for input(s): NA, K, CL, CO2, BUN, CREATININE, GLUCOSE, CALCIUM in the last 72 hours. Recent Labs    10/05/18 1610 10/06/18 0406  INR 3.1* 2.7*    stump protector on .  No results found.  Assessment/Plan: 1 Day Post-Op Procedure(s) (LRB): RIGHT BELOW KNEE AMPUTATION (Right) Plan:  Rehab IP vs SNF .  Back on insulin pump. subQ insulin stopped.   Marybelle Killings 10/06/2018, 1:24 PM

## 2018-10-06 NOTE — Progress Notes (Signed)
CBG noted as 96. Pt did not give himself any additional insulin. His continuous rate continues per pump orders. Remains asymptomatic.

## 2018-10-06 NOTE — Evaluation (Signed)
Physical Therapy Evaluation Patient Details Name: Ethan Campbell MRN: [id number] DOB: [birth year] Today's Date: 10/06/2018   History of Present Illness  58 yo male admitted for gangrene of R foot and underwent R BKA on 3/13. PMH: DM11, depression, bone cancer, hypothyroidism, arthritis. PSH: amputation of R great toe 06/20/2018, R 5th ray 09/26/2018. Resection of bone tumor in L femur, L TKA..  Clinical Impression  Pt admitted with above. Pt able to hop to/from bathroom with RW safely with min guard for management of wound vac and IV. Pt with good R hip and knee ROM. Pt instructed on R LE amputee HEP. Anticipate pt will progress quickly to safe mod I level of function with w/c for majority of mobility and RW to enter bathroom and short distances. Acute PT to cont to follow.    Follow Up Recommendations Home health PT;Supervision/Assistance - 24 hour    Equipment Recommendations  Wheelchair (measurements PT);Wheelchair cushion (measurements PT);Rolling walker with 5" wheels(wide RW )    Recommendations for Other Services       Precautions / Restrictions Precautions Precautions: Fall Precaution Comments: RLE splint to maintain knee extension Restrictions Weight Bearing Restrictions: Yes RLE Weight Bearing: Non weight bearing      Mobility  Bed Mobility Overal bed mobility: Modified Independent             General bed mobility comments: hob elevated, no physical assist needed  Transfers Overall transfer level: Needs assistance Equipment used: Rolling walker (2 wheeled) Transfers: Sit to/from Stand Sit to Stand: Min guard         General transfer comment: verbal cues for hand placement, min guard to steady during transition of hands from bed to walker  Ambulation/Gait Ambulation/Gait assistance: Min guard Gait Distance (Feet): 10 Feet(x2, to/from bathroom) Assistive device: Rolling walker (2 wheeled) Gait Pattern/deviations: Step-to pattern Gait velocity:  decreased Gait velocity interpretation: <1.31 ft/sec, indicative of household ambulator General Gait Details: pt able to clear L foot without difficulty and safety make it to the bathroom  Stairs            Wheelchair Mobility    Modified Rankin (Stroke Patients Only)       Balance Overall balance assessment: Needs assistance Sitting-balance support: Feet supported;No upper extremity supported Sitting balance-Leahy Scale: Good     Standing balance support: Bilateral upper extremity supported Standing balance-Leahy Scale: Poor Standing balance comment: dependent on bilat UEs due to R LE BKA                             Pertinent Vitals/Pain Pain Assessment: 0-10 Pain Score: 3  Pain Location: R foot Pain Descriptors / Indicators: Throbbing Pain Intervention(s): Monitored during session    Home Living Family/patient expects to be discharged to:: Private residence Living Arrangements: Spouse/significant other Available Help at Discharge: Family;Available 24 hours/day Type of Home: Mobile home Home Access: Ramped entrance     Home Layout: One level Home Equipment: [place] - 4 wheels;Bedside commode      Prior Function Level of Independence: Independent               Hand Dominance   Dominant Hand: Right    Extremity/Trunk Assessment   Upper Extremity Assessment Upper Extremity Assessment: Overall WFL for tasks assessed    Lower Extremity Assessment Lower Extremity Assessment: RLE deficits/detail RLE Deficits / Details: hip and knee WFL RLE Sensation: decreased light touch;history of peripheral neuropathy LLE Sensation: history of  peripheral neuropathy    Cervical / Trunk Assessment Cervical / Trunk Assessment: Normal  Communication   Communication: No difficulties  Cognition Arousal/Alertness: Awake/alert Behavior During Therapy: WFL for tasks assessed/performed Overall Cognitive Status: Within Functional Limits for tasks assessed                                         General Comments General comments (skin integrity, edema, etc.): R LE with wound vac, posterior splint in place to optimize R knee extension    Exercises     Assessment/Plan    PT Assessment Patient needs continued PT services  PT Problem List Decreased strength;Decreased range of motion;Decreased activity tolerance;Decreased balance;Decreased mobility;Decreased coordination;Decreased knowledge of use of DME       PT Treatment Interventions DME instruction;Gait training;Functional mobility training;Therapeutic activities;Neuromuscular re-education;Balance training;Therapeutic exercise    PT Goals (Current goals can be found in the Care Plan section)  Acute Rehab PT Goals Patient Stated Goal: home PT Goal Formulation: With patient Time For Goal Achievement: 10/20/18 Potential to Achieve Goals: Good    Frequency Min 4X/week   Barriers to discharge        Co-evaluation               AM-PAC PT "6 Clicks" Mobility  Outcome Measure Help needed turning from your back to your side while in a flat bed without using bedrails?: None Help needed moving from lying on your back to sitting on the side of a flat bed without using bedrails?: None Help needed moving to and from a bed to a chair (including a wheelchair)?: A Little Help needed standing up from a chair using your arms (e.g., wheelchair or bedside chair)?: A Little Help needed to walk in hospital room?: A Little Help needed climbing 3-5 steps with a railing? : A Lot 6 Click Score: 19    End of Session Equipment Utilized During Treatment: Gait belt Activity Tolerance: Patient tolerated treatment well Patient left: in chair;with call bell/phone within reach;with nursing/sitter in room Nurse Communication: Mobility status PT Visit Diagnosis: Difficulty in walking, not elsewhere classified (R26.2)    Time: 6004-5997 PT Time Calculation (min) (ACUTE ONLY): 23  min   Charges:   PT Evaluation $PT Eval Moderate Complexity: 1 Mod PT Treatments $Gait Training: 8-22 mins        Kittie Plater, PT, DPT Acute Rehabilitation Services Pager #: [phone] Office #: [phone]   Berline Lopes 10/06/2018, 11:28 AM

## 2018-10-06 NOTE — Plan of Care (Signed)
  Problem: Education: Goal: Knowledge of General Education information will improve Description Including pain rating scale, medication(s)/side effects and non-pharmacologic comfort measures Outcome: Progressing   Problem: Health Behavior/Discharge Planning: Goal: Ability to manage health-related needs will improve Outcome: Progressing   

## 2018-10-06 NOTE — Progress Notes (Signed)
NT reports blood CBG of 43. Repeated value of 45. Pt alert and oriented x4. States he frequently has low blood sugars without symptoms. Given 8oz of juice. Remains at bedside with pt. Will recheck CBG in 15 minutes.

## 2018-10-07 LAB — GLUCOSE, CAPILLARY
GLUCOSE-CAPILLARY: 47 mg/dL — AB (ref 70–99)
GLUCOSE-CAPILLARY: 141 mg/dL — AB (ref 70–99)
Glucose-Capillary: 87 mg/dL (ref 70–99)
Glucose-Capillary: 39 mg/dL — CL (ref 70–99)
Glucose-Capillary: 51 mg/dL — ABNORMAL LOW (ref 70–99)
Glucose-Capillary: 108 mg/dL — ABNORMAL HIGH (ref 70–99)
Glucose-Capillary: 218 mg/dL — ABNORMAL HIGH (ref 70–99)
Glucose-Capillary: 181 mg/dL — ABNORMAL HIGH (ref 70–99)

## 2018-10-07 LAB — PROTIME-INR
INR: 2.6 — ABNORMAL HIGH (ref 0.8–1.2)
Prothrombin Time: 27.3 seconds — ABNORMAL HIGH (ref 11.4–15.2)

## 2018-10-07 MED ORDER — WARFARIN SODIUM 5 MG PO TABS
5.0000 mg | ORAL_TABLET | Freq: Once | ORAL | Status: AC
  Administered 2018-10-07: 5 mg via ORAL
  Filled 2018-10-07: qty 1

## 2018-10-07 NOTE — Plan of Care (Signed)

## 2018-10-07 NOTE — Progress Notes (Signed)
   Subjective: 2 Days Post-Op Procedure(s) (LRB): RIGHT BELOW KNEE AMPUTATION (Right) Patient had an episode reported by nurse to me while with OT he gotten up from the bathroom and hands are slippery slipped on the walker and he slid down to the floor with a therapist present.  No loss consciousness did not hit his head and was able to be placed back in bed..  Objective: Vital signs in last 24 hours: Temp:  [98 F (36.7 C)-98.7 F (37.1 C)] 98.7 F (37.1 C) (03/15 1219) Pulse Rate:  [82-99] 99 (03/15 1219) Resp:  [14-20] 20 (03/15 1219) BP: (111-164)/(52-93) 111/65 (03/15 1219) SpO2:  [96 %-99 %] 97 % (03/15 1219)  Intake/Output from previous day: 03/14 0701 - 03/15 0700 In: 1330.6 [P.O.:1080; I.V.:250.6] Out: 1200 [Urine:1200] Intake/Output this shift: No intake/output data recorded.  No results for input(s): HGB in the last 72 hours. No results for input(s): WBC, RBC, HCT, PLT in the last 72 hours. No results for input(s): NA, K, CL, CO2, BUN, CREATININE, GLUCOSE, CALCIUM in the last 72 hours. Recent Labs    10/06/18 0406 10/07/18 0557  INR 2.7* 2.6*    Stump protector in place No results found.  Assessment/Plan: 2 Days Post-Op Procedure(s) (LRB): RIGHT BELOW KNEE AMPUTATION (Right) Up with therapy.  CPAP usage was discussed with patient's daughter she had brought in his machine and it was in the room on the shelf order was placed that he could use his own CPAP machine if it is acceptable with respiratory therapy hospital orders.  If not will use 1 over-the-counter CPAP machines.  Marybelle Killings 10/07/2018, 5:12 PM

## 2018-10-07 NOTE — Plan of Care (Signed)

## 2018-10-07 NOTE — Progress Notes (Signed)
ANTICOAGULATION CONSULT NOTE Pharmacy Consult:  Coumadin Indication: Afib  No Known Allergies  Patient Measurements: Height: 5\' 9"  (175.3 cm) Weight: (!) 309 lb 15.5 oz (140.6 kg) IBW/kg (Calculated) : 70.7  Vital Signs: Temp: 98.7 F (37.1 C) (03/15 0930) Temp Source: Oral (03/15 0930) BP: 133/52 (03/15 0930) Pulse Rate: 84 (03/15 0930)  Labs: Recent Labs    10/05/18 1610 10/06/18 0406 10/07/18 0557  LABPROT 31.8* 28.6* 27.3*  INR 3.1* 2.7* 2.6*    Estimated Creatinine Clearance: 48.2 mL/min (A) (by C-G formula based on SCr of 2.36 mg/dL (H)).   Assessment: 89 YOM admitted for transtibial amputation of right foot due to gangrene.  Pharmacy consulted to dose Coumadin for history of Afib.  He also has a history of LUE DVT in 2017.  INR slightly supra-therapeutic at 3.1 today.  His last Coumadin dose was on 10/03/18.    INR today = 2.6  Home Coumadin regimen: 7.5mg  PO daily except 10mg  on Mon and Fri  Goal of Therapy:  INR 2-3 Monitor platelets by anticoagulation protocol: Yes   Plan:  Repeat Coumadin 5 mg po x 1 today Daily PT / INR Monitor closely for s/sx of bleeding  Thank you Anette Guarneri, PharmD [phone]  10/07/2018, 11:26 AM

## 2018-10-07 NOTE — Progress Notes (Signed)
0834 blood sugar was 39; rechecked went to 51, rechecked went to 108. Now at 1228 pt was 218. Will continue to monitor. Dr. Lorin Mercy on call MD aware.

## 2018-10-07 NOTE — Plan of Care (Signed)
  Problem: Pain Managment: Goal: General experience of comfort will improve Outcome: Progressing   Problem: Safety: Goal: Ability to remain free from injury will improve Outcome: Progressing   

## 2018-10-07 NOTE — Progress Notes (Signed)
Occupational Therapy Treatment Patient Details Name: Ethan Campbell MRN: [id number] DOB: [birth year] Today's Date: 10/07/2018    History of present illness 58 yo male admitted for gangrene of R foot and underwent R BKA on 3/13. PMH: DM11, depression, bone cancer, hypothyroidism, arthritis. PSH: amputation of R great toe 06/20/2018, R 5th ray 09/26/2018. Resection of bone tumor in L femur, L TKA..   OT comments  Pt slowly progressing toward OT goals. Pt received in bed upon arrival and engaged in toilet transfer, grooming/hygiene at sink level. Pt required Min A with functional trasnfers with RW, Min guard static standing balance. However, with fall accident that is indicated in treatment note, it is highly recommended for patient to be DC to SNF rather than home health for safety awareness and to increased strength for functional mobility for ADLs tasks. OT will continue to follow acutely.    Follow Up Recommendations  Supervision/Assistance - 24 hour;SNF    Equipment Recommendations  Tub/shower bench    Recommendations for Other Services      Precautions / Restrictions Precautions Precautions: Fall Precaution Comments: RLE splint to maintain knee extension Restrictions Weight Bearing Restrictions: Yes RLE Weight Bearing: Non weight bearing       Mobility Bed Mobility Overal bed mobility: (P) Needs Assistance             General bed mobility comments: hob elevated, no physical assist needed  Transfers Overall transfer level: Needs assistance Equipment used: Rolling walker (2 wheeled) Transfers: Sit to/from Stand Sit to Stand: Min guard         General transfer comment: Min guard assist during transition    Balance Overall balance assessment: Needs assistance Sitting-balance support: Feet supported;No upper extremity supported Sitting balance-Leahy Scale: Good     Standing balance support: Bilateral upper extremity supported Standing balance-Leahy Scale:  Poor Standing balance comment: dependent on bilat UEs due to R LE BKA                           ADL either performed or assessed with clinical judgement   ADL Overall ADL's : Needs assistance/impaired Eating/Feeding: Set up;Sitting   Grooming: Wash/dry hands;Standing;Minimal assistance Grooming Details (indicate cue type and reason): Pt engaged in washing hands at the sink after toileting. Pt demonstrated good standing balance with use of RW and MIn gaurd . after task and heading back to bed patient position hands safely on RW as coming back patient reports hand slipping and fell back with therapist lowering back safely to the floor.  Upper Body Bathing: Set up;Sitting               Toilet Transfer: Minimal Agricultural engineer Details (indicate cue type and reason): Pt requied Min A to transfer to toilet with RW for safety.          Functional mobility during ADLs: Minimal assistance;Rolling walker General ADL Comments: Initially pt demonstrated good functional transfer for ADLs, however with fall accident strength and safety awareness is now a concern. pt reports "having multiple falls in home setting.'     Vision   Vision Assessment?: No apparent visual deficits   Perception     Praxis      Cognition Arousal/Alertness: (P) Awake/alert Behavior During Therapy: (P) WFL for tasks assessed/performed Overall Cognitive Status: (P) Within Functional Limits for tasks assessed  Exercises     Shoulder Instructions       General Comments Pt is now recommended to receive rehab for additonal therapy for strength and safety awareness.    Pertinent Vitals/ Pain       Pain Assessment: (P) Faces Faces Pain Scale: (P) Hurts little more Pain Location: (P) R residual limb Pain Descriptors / Indicators: (P) Discomfort Pain Intervention(s): (P) Monitored during session  Home  Living                                          Prior Functioning/Environment              Frequency  Min 2X/week        Progress Toward Goals  OT Goals(current goals can now be found in the care plan section)  Progress towards OT goals: Progressing toward goals  Acute Rehab OT Goals Patient Stated Goal: go home soon OT Goal Formulation: With patient Time For Goal Achievement: 10/20/18 Potential to Achieve Goals: Good ADL Goals Pt Will Perform Grooming: with supervision;standing Pt Will Perform Lower Body Dressing: with modified independence;with adaptive equipment;sit to/from stand Pt Will Transfer to Toilet: with modified independence;ambulating;regular height toilet;bedside commode Pt Will Perform Toileting - Clothing Manipulation and hygiene: with modified independence;sit to/from stand  Plan Discharge plan needs to be updated;Frequency remains appropriate    Co-evaluation                 AM-PAC OT "6 Clicks" Daily Activity     Outcome Measure   Help from another person eating meals?: None Help from another person taking care of personal grooming?: None Help from another person toileting, which includes using toliet, bedpan, or urinal?: A Lot Help from another person bathing (including washing, rinsing, drying)?: A Lot Help from another person to put on and taking off regular upper body clothing?: None Help from another person to put on and taking off regular lower body clothing?: A Lot 6 Click Score: 18    End of Session Equipment Utilized During Treatment: Rolling walker(limb guard)  OT Visit Diagnosis: Other abnormalities of gait and mobility (R26.89);Muscle weakness (generalized) (M62.81);Pain Pain - Right/Left: Right Pain - part of body: Leg(residual limb)   Activity Tolerance Patient tolerated treatment well   Patient Left in bed;with call bell/phone within reach;with nursing/sitter in room   Nurse Communication           Time: 1610-9604 OT Time Calculation (min): 28 min  Charges: OT General Charges $OT Visit: 1 Visit OT Treatments $Self Care/Home Management : 23-37 mins  Minus Breeding, MSOT, OTR/L  Supplemental Rehabilitation Services  [phone]   Marius Ditch 10/07/2018, 12:42 PM

## 2018-10-07 NOTE — Progress Notes (Signed)
Physical Therapy Treatment Patient Details Name: Ethan Campbell MRN: [id number] DOB: 1961/04/13 Today's Date: 10/07/2018    History of Present Illness 58 yo male admitted for gangrene of R foot and underwent R BKA on 3/13. PMH: DM11, depression, bone cancer, hypothyroidism, arthritis. PSH: amputation of R great toe 06/20/2018, R 5th ray 09/26/2018. Resection of bone tumor in L femur, L TKA..    PT Comments    Pt on the floor in bathroom with OT upon PT arrival. Per OT patient lowered to the ground after his hand slipped off the RW when stepping backwards from sink to return to chair. Pt reports "I fall all the time at home, but this is the first time without my R LE". Pt able to climb back in bed with maintaining R LE NWB. Pt diapheretic and HR 125, SpO2 >95%. Discussed going home and that it would be beneficial for patient to undergo ST-SNF to improved strength and activity tolerance prior to transitioning home to minimize falls risk. Pt agreeable. Acute PT to cont to follow.   Follow Up Recommendations  SNF     Equipment Recommendations  Wheelchair (measurements PT);Wheelchair cushion (measurements PT);Rolling walker with 5" wheels    Recommendations for Other Services       Precautions / Restrictions Precautions Precautions: Fall Precaution Comments: RLE splint to maintain knee extension Restrictions Weight Bearing Restrictions: Yes RLE Weight Bearing: Non weight bearing    Mobility  Bed Mobility Overal bed mobility: Needs Assistance             General bed mobility comments: PT walked into patient's room and found pt sitting on bathroom floor with OT. We problem solved how to get him up off the floor. Pt reports "I fall a lot at home. Let me scoot over to the bed." while maintaining R LE NWB and elevated pt scooted himself over to the bed and pulled him self up across the bed with assist to block L foot from sliding on floor and to [employer]. modA to bring LEs up to  bed and then he pushed wiht his L foot on the foot of the bed up to the [facility]. Pt with noted diapheresis and report "I can't breathe." pts SpO2 >97% on RA and HR 124. Pt rolled over to his R and pulled his IV out due to line being under his elbow. RN, Agricultural consultant, and tech all present at this time  Transfers Overall transfer level: Needs assistance Equipment used: Rolling walker (2 wheeled) Transfers: Sit to/from Stand Sit to Stand: Min guard         General transfer comment: Min guard assist during transition  Ambulation/Gait                 Stairs             Wheelchair Mobility    Modified Rankin (Stroke Patients Only)       Balance Overall balance assessment: Needs assistance Sitting-balance support: Feet supported;No upper extremity supported Sitting balance-Leahy Scale: Good     Standing balance support: Bilateral upper extremity supported Standing balance-Leahy Scale: Poor Standing balance comment: dependent on bilat UEs due to R LE BKA                            Cognition Arousal/Alertness: Awake/alert Behavior During Therapy: WFL for tasks assessed/performed Overall Cognitive Status: Within Functional Limits for tasks assessed  Exercises      General Comments General comments (skin integrity, edema, etc.): Pt is now recommended to receive rehab for additonal therapy for strength and safety awareness.      Pertinent Vitals/Pain Pain Assessment: Faces Faces Pain Scale: Hurts little more Pain Location: R residual limb Pain Descriptors / Indicators: Discomfort Pain Intervention(s): Monitored during session    Home Living                      Prior Function            PT Goals (current goals can now be found in the care plan section) Acute Rehab PT Goals Patient Stated Goal: go home soon Progress towards PT goals: Progressing toward goals    Frequency    Min  4X/week      PT Plan Discharge plan needs to be updated    Co-evaluation              AM-PAC PT "6 Clicks" Mobility   Outcome Measure  Help needed turning from your back to your side while in a flat bed without using bedrails?: None Help needed moving from lying on your back to sitting on the side of a flat bed without using bedrails?: None Help needed moving to and from a bed to a chair (including a wheelchair)?: A Little Help needed standing up from a chair using your arms (e.g., wheelchair or bedside chair)?: A Little Help needed to walk in hospital room?: A Little Help needed climbing 3-5 steps with a railing? : A Lot 6 Click Score: 19    End of Session Equipment Utilized During Treatment: Gait belt Activity Tolerance: Patient tolerated treatment well Patient left: in bed;with call bell/phone within reach;with nursing/sitter in room Nurse Communication: Mobility status(pt fell in bathroom) PT Visit Diagnosis: Difficulty in walking, not elsewhere classified (R26.2)     Time: 3546-5681 PT Time Calculation (min) (ACUTE ONLY): 16 min  Charges:  $Therapeutic Activity: 8-22 mins                     Kittie Plater, PT, DPT Acute Rehabilitation Services Pager #: [phone] Office #: [phone]    Berline Lopes 10/07/2018, 12:44 PM

## 2018-10-07 NOTE — Progress Notes (Signed)
Patient ID: Ethan Campbell, male   DOB: April 12, 1961, 58 y.o.   MRN: [id number] Status post transtibial amputation.  Patient has about 50 cc of clear serosanguineous drainage.  Patient is working well with therapy.  Patient states that he may be able to go home we will see how he progresses with therapy.

## 2018-10-07 NOTE — Progress Notes (Signed)
Patient had a witnessed, assisted fall with occupational therapy in restroom. Family was notified, Hilda Blades.

## 2018-10-07 NOTE — Progress Notes (Signed)
Inpatient Diabetes Program Recommendations  AACE/ADA: New Consensus Statement on Inpatient Glycemic Control (2015)  Target Ranges:  Prepandial:   less than 140 mg/dL      Peak postprandial:   less than 180 mg/dL (1-2 hours)      Critically ill patients:  140 - 180 mg/dL   Lab Results  Component Value Date   GLUCAP 181 (H) 10/07/2018   HGBA1C 6.9 (A) 06/15/2018    Review of Glycemic Control  Hypoglycemia on 3/14 and 3/15. 43 mg/dL yesterday am and 39 mg/dL this am. 47 mg/dL at 2 am glucose check. Needs rate change for insulin pump.  Inpatient Diabetes Program Recommendations:     Pt will need to f/u with endo/PCP regarding multiple hypoglycemia events. Will likely need decrease in basal rate. Eating 100%.  See in am.  Thank you. Lorenda Peck, RD, LDN, CDE Inpatient Diabetes Coordinator [phone]

## 2018-10-07 NOTE — Progress Notes (Signed)
Dr. Lorin Mercy on call for Dr. Sharol Given was notified of witnessed, assisted call. Will continue to monitor.

## 2018-10-08 ENCOUNTER — Ambulatory Visit: Payer: Medicaid Other | Admitting: Endocrinology

## 2018-10-08 ENCOUNTER — Telehealth (INDEPENDENT_AMBULATORY_CARE_PROVIDER_SITE_OTHER): Payer: Self-pay | Admitting: Radiology

## 2018-10-08 LAB — CBC WITH DIFFERENTIAL/PLATELET
Abs Immature Granulocytes: 0.12 10*3/uL — ABNORMAL HIGH (ref 0.00–0.07)
BASOS ABS: 0 10*3/uL (ref 0.0–0.1)
Basophils Relative: 0 %
Eosinophils Absolute: 0 10*3/uL (ref 0.0–0.5)
Eosinophils Relative: 0 %
HCT: 23.8 % — ABNORMAL LOW (ref 39.0–52.0)
Hemoglobin: 7.8 g/dL — ABNORMAL LOW (ref 13.0–17.0)
Immature Granulocytes: 1 %
Lymphocytes Relative: 10 %
Lymphs Abs: 1.8 10*3/uL (ref 0.7–4.0)
MCH: 28 pg (ref 26.0–34.0)
MCHC: 32.8 g/dL (ref 30.0–36.0)
MCV: 85.3 fL (ref 80.0–100.0)
Monocytes Absolute: 1.9 10*3/uL — ABNORMAL HIGH (ref 0.1–1.0)
Monocytes Relative: 11 %
NRBC: 0 % (ref 0.0–0.2)
Neutro Abs: 13.4 10*3/uL — ABNORMAL HIGH (ref 1.7–7.7)
Neutrophils Relative %: 78 %
Platelets: 451 10*3/uL — ABNORMAL HIGH (ref 150–400)
RBC: 2.79 MIL/uL — ABNORMAL LOW (ref 4.22–5.81)
RDW: 15.4 % (ref 11.5–15.5)
WBC: 17.2 10*3/uL — AB (ref 4.0–10.5)

## 2018-10-08 LAB — GLUCOSE, CAPILLARY
GLUCOSE-CAPILLARY: 209 mg/dL — AB (ref 70–99)
Glucose-Capillary: 88 mg/dL (ref 70–99)
Glucose-Capillary: 102 mg/dL — ABNORMAL HIGH (ref 70–99)
Glucose-Capillary: 152 mg/dL — ABNORMAL HIGH (ref 70–99)
Glucose-Capillary: 200 mg/dL — ABNORMAL HIGH (ref 70–99)

## 2018-10-08 LAB — CBC
HCT: 21.4 % — ABNORMAL LOW (ref 39.0–52.0)
HEMOGLOBIN: 6.5 g/dL — AB (ref 13.0–17.0)
MCH: 26.9 pg (ref 26.0–34.0)
MCHC: 30.4 g/dL (ref 30.0–36.0)
MCV: 88.4 fL (ref 80.0–100.0)
Platelets: 533 10*3/uL — ABNORMAL HIGH (ref 150–400)
RBC: 2.42 MIL/uL — ABNORMAL LOW (ref 4.22–5.81)
RDW: 15.2 % (ref 11.5–15.5)
WBC: 13.4 10*3/uL — ABNORMAL HIGH (ref 4.0–10.5)
nRBC: 0 % (ref 0.0–0.2)

## 2018-10-08 LAB — PROTIME-INR
INR: 1.9 — ABNORMAL HIGH (ref 0.8–1.2)
Prothrombin Time: 21.9 seconds — ABNORMAL HIGH (ref 11.4–15.2)

## 2018-10-08 LAB — PREPARE RBC (CROSSMATCH)

## 2018-10-08 MED ORDER — SODIUM CHLORIDE 0.9% IV SOLUTION
Freq: Once | INTRAVENOUS | Status: AC
  Administered 2018-10-08: 07:00:00 via INTRAVENOUS

## 2018-10-08 MED ORDER — WARFARIN SODIUM 7.5 MG PO TABS
7.5000 mg | ORAL_TABLET | Freq: Once | ORAL | Status: AC
  Administered 2018-10-08: 7.5 mg via ORAL
  Filled 2018-10-08: qty 1

## 2018-10-08 NOTE — Telephone Encounter (Signed)
Hilda Blades, patient's wife, called with concerns. She states that she was not able to be there this morning when rounds were made. She has expressed concern that her husband is not able to use his CPAP machine.  She has been told on several occasions that an order is being entered for this, she even spoke with Dr. Lorin Mercy on call last night, but he still does not have his CPAP. She would like to know if she can go up there and put it on him herself?  She would also like to speak with Dr. Sharol Given regarding how her husband is doing since she was unable to be there this morning. She would be able to meet Dr. Sharol Given at the hospital around 5:30p if that is better for him.  Please call Hilda Blades to advise.  CB [phone]

## 2018-10-08 NOTE — Progress Notes (Signed)
Occupational Therapy Treatment Patient Details Name: Ethan Campbell MRN: [id number] DOB: Dec 01, 1960 Today's Date: 10/08/2018    History of present illness 58 yo male admitted for gangrene of R foot and underwent R BKA on 3/13. PMH: DM11, depression, bone cancer, hypothyroidism, arthritis. PSH: amputation of R great toe 06/20/2018, R 5th ray 09/26/2018. Resection of bone tumor in L femur, L TKA..   OT comments  Pt progressing toward OT goals. Pt received in bed reclined with complaints of residual limb pain. Session involved education of sensory input to RLE, LB AE, and exercises for increased strength with use of theraband. Pt engaged in 10 reps with 5 sec hold of shoulder extension, shoulder flexion, elbow extension and elbow flexion. Pt DC plan still remains appropriate. OT will continue to follow acutely.   Follow Up Recommendations  Supervision/Assistance - 24 hour;SNF    Equipment Recommendations  Tub/shower bench    Recommendations for Other Services      Precautions / Restrictions Precautions Precautions: Fall Precaution Comments: RLE splint to maintain knee extension Restrictions Weight Bearing Restrictions: Yes RLE Weight Bearing: Non weight bearing       Mobility Bed Mobility               General bed mobility comments: Pt preferred to sit up in bed due to increased phantom pain of R LE while leaning from side of bed. Wanted increased support.   Transfers                      Balance Overall balance assessment: Needs assistance Sitting-balance support: Feet supported;No upper extremity supported Sitting balance-Leahy Scale: Good     Standing balance support: Bilateral upper extremity supported Standing balance-Leahy Scale: Poor Standing balance comment: dependent on bilat UEs due to R LE BKA                           ADL either performed or assessed with clinical judgement   ADL Overall ADL's : Needs assistance/impaired              Lower Body Bathing: Minimal assistance Lower Body Bathing Details (indicate cue type and reason): Pt educated of Long handled sponge for LB bathing     Lower Body Dressing: Sit to/from stand;Minimal assistance Lower Body Dressing Details (indicate cue type and reason): pt educated of Reacher for assistanct with LB dressing. In need of sock aide as well.              Functional mobility during ADLs: Minimal assistance;Rolling walker General ADL Comments: Pt reports residual limb pain so session address UE strength exercise and education for LB ADLS.      Vision   Vision Assessment?: No apparent visual deficits   Perception     Praxis      Cognition Arousal/Alertness: Awake/alert Behavior During Therapy: WFL for tasks assessed/performed Overall Cognitive Status: Within Functional Limits for tasks assessed                                          Exercises Exercises: General Upper Extremity General Exercises - Upper Extremity Shoulder Flexion: AROM;10 reps;Theraband;Seated;Both Theraband Level (Shoulder Flexion): Level 3 (Green) Shoulder Extension: AROM;Both;10 reps;Seated;Theraband Theraband Level (Shoulder Extension): Level 3 (Green) Elbow Flexion: AROM;10 reps;Seated;Theraband Theraband Level (Elbow Flexion): Level 3 (Green) Elbow Extension: AROM;Both;10 reps;Seated;Theraband Theraband Level (Elbow  Extension): Level 3 (Green)   Shoulder Instructions       General Comments      Pertinent Vitals/ Pain       Pain Assessment: Faces Faces Pain Scale: Hurts even more Pain Location: R residual limb Pain Descriptors / Indicators: Discomfort Pain Intervention(s): Limited activity within patient's tolerance;Monitored during session;Repositioned  Home Living                                          Prior Functioning/Environment              Frequency  Min 2X/week        Progress Toward Goals  OT Goals(current  goals can now be found in the care plan section)  Progress towards OT goals: Progressing toward goals  Acute Rehab OT Goals Patient Stated Goal: go home soon OT Goal Formulation: With patient Time For Goal Achievement: 10/20/18 Potential to Achieve Goals: Good ADL Goals Pt Will Perform Grooming: with supervision;standing Pt Will Perform Lower Body Dressing: with modified independence;with adaptive equipment;sit to/from stand Pt Will Transfer to Toilet: with modified independence;ambulating;regular height toilet;bedside commode Pt Will Perform Toileting - Clothing Manipulation and hygiene: with modified independence;sit to/from stand  Plan Discharge plan remains appropriate;Frequency remains appropriate    Co-evaluation                 AM-PAC OT "6 Clicks" Daily Activity     Outcome Measure   Help from another person eating meals?: None Help from another person taking care of personal grooming?: None Help from another person toileting, which includes using toliet, bedpan, or urinal?: A Lot Help from another person bathing (including washing, rinsing, drying)?: A Lot Help from another person to put on and taking off regular upper body clothing?: None Help from another person to put on and taking off regular lower body clothing?: A Lot 6 Click Score: 18    End of Session Equipment Utilized During Treatment: Rolling walker(limb guard)  OT Visit Diagnosis: Other abnormalities of gait and mobility (R26.89);Muscle weakness (generalized) (M62.81);Pain Pain - Right/Left: Right Pain - part of body: Leg(residual limb)   Activity Tolerance Patient limited by pain   Patient Left in bed;with call bell/phone within reach;with nursing/sitter in room   Nurse Communication Weight bearing status;Mobility status        Time: 3086-5784 OT Time Calculation (min): 20 min  Charges: OT General Charges $OT Visit: 1 Visit OT Treatments $Self Care/Home Management : 8-22 mins  Minus Breeding, MSOT, OTR/L  Supplemental Rehabilitation Services  [phone]   Marius Ditch 10/08/2018, 1:16 PM

## 2018-10-08 NOTE — Progress Notes (Signed)
Pt refusing cpap for the night. RT will continue to monitor as needed. 

## 2018-10-08 NOTE — Progress Notes (Signed)
ANTICOAGULATION CONSULT NOTE Pharmacy Consult:  Coumadin Indication: Afib  No Known Allergies  Patient Measurements: Height: 5\' 9"  (175.3 cm) Weight: (!) 309 lb 15.5 oz (140.6 kg) IBW/kg (Calculated) : 70.7  Vital Signs: Temp: 99.9 F (37.7 C) (03/16 0904) Temp Source: Oral (03/16 0904) BP: 144/55 (03/16 0904) Pulse Rate: 88 (03/16 0904)  Labs: Recent Labs    10/06/18 0406 10/07/18 0557 10/08/18 0429  HGB  --   --  6.5*  HCT  --   --  21.4*  PLT  --   --  533*  LABPROT 28.6* 27.3* 21.9*  INR 2.7* 2.6* 1.9*    Estimated Creatinine Clearance: 48.2 mL/min (A) (by C-G formula based on SCr of 2.36 mg/dL (H)).   Assessment: 68 YOM admitted for transtibial amputation of right foot due to gangrene.  Pharmacy consulted to dose Coumadin for history of Afib.  He also has a history of LUE DVT in 2017.  Anticoag: Coumadin for VTE px. On Coumadin PTA for afib and hx DVT LUE (02/2016). Last INR 2.8 on 3/5. Rx dosed Coumadin 3/4 then discharged on 3/5.  Readmitted 3/13 for transtibial amputation of right foot d/t gangrene; wound VAC applied; No bleeding noted  INR today = 1.9   PTA Coumadin regimen: 7.5 mg daily except 10 mg on Mon & Fri  - last dose 3/11  Heme/Onc: H&H 6.5/21.4, Plt 533  Goal of Therapy:  INR 2-3 Monitor platelets by anticoagulation protocol: Yes   Plan:  Warfarin 7.5 mg po x 1 Daily INR  Levester Fresh, PharmD, BCPS, BCCCP Clinical Pharmacist [phone]  Please check AMION for all [employer] numbers  10/08/2018 11:35 AM

## 2018-10-08 NOTE — Plan of Care (Signed)
  Problem: Education: Goal: Knowledge of General Education information will improve Description: Including pain rating scale, medication(s)/side effects and non-pharmacologic comfort measures Outcome: Progressing   Problem: Clinical Measurements: Goal: Ability to maintain clinical measurements within normal limits will improve Outcome: Progressing Goal: Will remain free from infection Outcome: Progressing   

## 2018-10-08 NOTE — Progress Notes (Signed)
Physical Therapy Treatment Patient Details Name: Ethan Campbell MRN: [id number] DOB: 02/14/1961 Today's Date: 10/08/2018    History of Present Illness 58 yo male admitted for gangrene of R foot and underwent R BKA on 3/13. PMH: DM11, depression, bone cancer, hypothyroidism, arthritis. PSH: amputation of R great toe 06/20/2018, R 5th ray 09/26/2018. Resection of bone tumor in L femur, L TKA..    PT Comments    Patient seen for mobility progression. OOB mobility held per RN due to low Hgb this am and still receiving PRBC. This session focused on strengthening/ROM therex which pt tolerated very well. Continue to progress as tolerated.    Follow Up Recommendations  SNF     Equipment Recommendations  Wheelchair (measurements PT);Wheelchair cushion (measurements PT);Rolling walker with 5" wheels    Recommendations for Other Services       Precautions / Restrictions Precautions Precautions: Fall Precaution Comments: limb protector Restrictions Weight Bearing Restrictions: Yes RLE Weight Bearing: Non weight bearing    Mobility  Bed Mobility Overal bed mobility: Modified Independent Bed Mobility: Rolling           General bed mobility comments: use of rail  Transfers                    Ambulation/Gait                 Stairs             Wheelchair Mobility    Modified Rankin (Stroke Patients Only)       Balance                                            Cognition Arousal/Alertness: Awake/alert Behavior During Therapy: WFL for tasks assessed/performed Overall Cognitive Status: Within Functional Limits for tasks assessed                                        Exercises Amputee Exercises Quad Sets: AROM;Right;Supine Towel Squeeze: AROM;Supine Hip Extension: AROM;Right;Sidelying Hip ABduction/ADduction: AROM;Right;Supine;Sidelying Hip Flexion/Marching: AROM;Right;Supine;Sidelying Knee Extension:  AROM;Right;Supine(short arc quad) Straight Leg Raises: AROM;Right;Supine    General Comments        Pertinent Vitals/Pain Pain Assessment: Faces Faces Pain Scale: Hurts little more Pain Location: R residual limb Pain Descriptors / Indicators: Burning(phantom pains) Pain Intervention(s): Limited activity within patient's tolerance;Monitored during session;Premedicated before session;Repositioned    Home Living                      Prior Function            PT Goals (current goals can now be found in the care plan section) Acute Rehab PT Goals Patient Stated Goal: go home soon Progress towards PT goals: Progressing toward goals    Frequency    Min 4X/week      PT Plan Current plan remains appropriate    Co-evaluation              AM-PAC PT "6 Clicks" Mobility   Outcome Measure  Help needed turning from your back to your side while in a flat bed without using bedrails?: None Help needed moving from lying on your back to sitting on the side of a flat bed without using bedrails?: None Help needed moving  to and from a bed to a chair (including a wheelchair)?: A Little Help needed standing up from a chair using your arms (e.g., wheelchair or bedside chair)?: A Little Help needed to walk in hospital room?: A Little Help needed climbing 3-5 steps with a railing? : A Lot 6 Click Score: 19    End of Session Equipment Utilized During Treatment: Gait belt Activity Tolerance: Patient tolerated treatment well Patient left: in bed;with call bell/phone within reach Nurse Communication: Mobility status PT Visit Diagnosis: Difficulty in walking, not elsewhere classified (R26.2)     Time: 6151-8343 PT Time Calculation (min) (ACUTE ONLY): 29 min  Charges:  $Therapeutic Exercise: 23-37 mins                     Ethan Campbell, PTA Acute Rehabilitation Services Pager: [phone] Office: [phone]     Darliss Cheney 10/08/2018, 5:08 PM

## 2018-10-08 NOTE — Telephone Encounter (Signed)
Dr. Sharol Given spoke with pt's wife.

## 2018-10-08 NOTE — Telephone Encounter (Signed)
Called pt's wife and informed her of MD message. Pt verbalized understanding.

## 2018-10-08 NOTE — Progress Notes (Signed)
Patient ID: Ethan Campbell, male   DOB: [birth year], 58 y.o.   MRN: [id number] Patient slipped in the bathroom yesterday.  Patient states that he has the same problem at home.  Will need for discharge to skilled nursing.  Patient has 250 cc in the wound VAC canister only had 50 cc yesterday this may be from his acute slip.

## 2018-10-08 NOTE — Progress Notes (Signed)
Inpatient Diabetes Program Recommendations  AACE/ADA: New Consensus Statement on Inpatient Glycemic Control   Target Ranges:  Prepandial:   less than 140 mg/dL      Peak postprandial:   less than 180 mg/dL (1-2 hours)      Critically ill patients:  140 - 180 mg/dL   Results for Ethan Campbell, Ethan Campbell (MRN [id number]) as of 10/08/2018 11:55  Ref. Range 10/07/2018 08:34 10/07/2018 09:02 10/07/2018 09:36 10/07/2018 12:28 10/07/2018 16:23 10/07/2018 21:25 10/08/2018 02:28 10/08/2018 08:28 10/08/2018 11:38  Glucose-Capillary Latest Ref Range: 70 - 99 mg/dL 39 (LL) 51 (L) 108 (H) 218 (H) 181 (H) 141 (H) 88 102 (H) 152 (H)   Review of Glycemic Control  Diabetes history: DM2 Outpatient Diabetes medications: OnmiPod insulin pump with Humulin R U500, Victoza 1.8 mg daily Current orders for Inpatient glycemic control: Insulin Pump with Humulin R U500 ACHS & 2 am  Spoke with patient regarding diabetes and home regimen for diabetes management.  Patient states that he is followed by Dr. Dwyane Dee for diabetes management. Patient last seen Dr. Dwyane Dee on 08/15/18.  Patient uses an Omni-Pod insulin pump with Humulin R U500 insulin as an outpatient. Patient has insulin pump connected and using it for inpatient DM control.  Patient states that he does not count carbs with puts in his glucose and depending on what his glucose is, he takes bolus of insulin with meals if needed. Patient reports that since glucose was 88 mg/dl this morning before breakfast he did not take any insulin bolus with breakfast this morning.  Reviewed insulin pump basal settings which are as follows:   Basal insulin  12A 0.4 units/hour 12P 0.9 units/hour 9P 0.75 units/hour  Discussed hypoglycemia noted since inpatient.  Patient states that he has hypoglycemia at home as well despite the recent changes with insulin pump settings by Dr. Dwyane Dee on 08/15/18. Patient reports that if he eats a snack at bedtime, his glucose is usually okay. However, if he goes to  sleep without eating a snack, his glucose is usually in the 30's or 40's mg/dl upon waking in the morning.   Patient states he can not tell when his glucose is low. Discussed hypoglycemia unawareness and reviewed dangers of hypoglycemia.  Encouraged patient to reach back out to Dr. Dwyane Dee for his recommendations on insulin pump setting adjustments if he continues to experience hypoglycemia. Patient reports that he changed out his insulin pump pod today and that he has extra supplies with him at bedside. Patient reports that he usually uses a [employer] sensor as well but notes that it came out when he fell at home. Patient reports that his girlfriend is suppose to be bringing a new sensor to him at the hospital. Explained that he can use the [employer] [place] sensor if he would like but nursing staff will continue to check glucose with fingersticks while he is inpatient.  Informed patient that his glucose trends will be followed by our team while inpatient but if he has any other issues with hypoglycemia then we will call Dr. Dwyane Dee for his recommendations then discuss with attending MD.  Patient verbalized understanding of information discussed and states that he does not have any further questions related to diabetes at this time.  NURSING: If not already done, please print off the Patient insulin pump contract and flow sheet. The insulin pump contract should be signed by the patient and then placed in the chart. The patient insulin pump flow sheet will be completed by the patient  at the bedside and the RN caring for the patient will use the patient's flow sheet to document in the [facility]. RN will need to complete the Nursing Insulin Pump Flowsheet at least once a shift. Patient will need to keep extra insulin pump supplies at the bedside at all times.   Thanks, Barnie Alderman, RN, MSN, CDE Diabetes Coordinator Inpatient Diabetes Program [phone] (Team Pager from 8am to 5pm)

## 2018-10-08 NOTE — Progress Notes (Signed)
Inpatient Rehabilitation Admissions Coordinator  Inpatient rehab consult received. I met with patient at bedside for rehab assessment. Therapy recs were originally for [facility] but pt with a fall over the weekend with OT. SNF then recommended. Patient has ramp, 24/7 assist and needed DME at home. He prefers Home with [place] and I concur. I have notified SW, Caryl Pina. We will sign off at this time.  Danne Baxter, RN, MSN Rehab Admissions Coordinator [phone] 10/08/2018 12:57 PM

## 2018-10-08 NOTE — Progress Notes (Signed)
Patient has a critical lab- Hgb 6.5. Patient asymptomatic at this time. Notified Dr Lorin Mercy, on call for Dr. Sharol Given and received orders to transfuse patient with 2 units of PRBCs.

## 2018-10-09 LAB — GLUCOSE, CAPILLARY
Glucose-Capillary: 43 mg/dL — CL (ref 70–99)
Glucose-Capillary: 160 mg/dL — ABNORMAL HIGH (ref 70–99)
Glucose-Capillary: 112 mg/dL — ABNORMAL HIGH (ref 70–99)
Glucose-Capillary: 187 mg/dL — ABNORMAL HIGH (ref 70–99)
Glucose-Capillary: 251 mg/dL — ABNORMAL HIGH (ref 70–99)
Glucose-Capillary: 230 mg/dL — ABNORMAL HIGH (ref 70–99)

## 2018-10-09 LAB — BPAM RBC
BLOOD PRODUCT EXPIRATION DATE: 202004152359
Blood Product Expiration Date: 202004152359
ISSUE DATE / TIME: 202003160637
ISSUE DATE / TIME: 202003161303
UNIT TYPE AND RH: 5100
Unit Type and Rh: 5100

## 2018-10-09 LAB — TYPE AND SCREEN
ABO/RH(D): O POS
ANTIBODY SCREEN: NEGATIVE
Unit division: 0
Unit division: 0

## 2018-10-09 LAB — PROTIME-INR
INR: 1.8 — ABNORMAL HIGH (ref 0.8–1.2)
Prothrombin Time: 20.2 seconds — ABNORMAL HIGH (ref 11.4–15.2)

## 2018-10-09 MED ORDER — WARFARIN SODIUM 7.5 MG PO TABS
7.5000 mg | ORAL_TABLET | Freq: Once | ORAL | Status: AC
  Administered 2018-10-09: 7.5 mg via ORAL
  Filled 2018-10-09: qty 1

## 2018-10-09 NOTE — TOC Progression Note (Signed)
Transition of Care [facility]) - Progression Note    Patient Details  Name: Ethan Campbell MRN: [id number] Date of Birth: 1961/05/08  Transition of [facility]) CM/SW Contact  Loletha Grayer Beverely Pace, RN Phone Number: 10/09/2018, 11:43 AM  Clinical Narrative:  58 yr old male admitted with right gangrenous foot, patient underwent a right BKA. Referral for Home Health therapy was called to Neoma Laming, [place] Liaison. PAtient says he will have family support at discharge. Has all necessary DME.   Expected Discharge Plan: [place] Barriers to Discharge: No Barriers Identified  Expected Discharge Plan and Services Expected Discharge Plan: [place] Discharge Planning Services: CM Consult Post Acute Care Choice: [place] arrangements for the past 2 months: Single Family Home                 DME Arranged: N/A DME Agency: NA HH Arranged: (S) PT(PT only) HH Agency: [place] ([facility])   Social Determinants of Health (SDOH) Interventions    Readmission Risk Interventions 30 Day Unplanned Readmission Risk Score     Admission (Current) from 10/05/2018 in [place]  30 Day Unplanned Readmission Risk Score (%)  15 Filed at 10/09/2018 0801     This score is the patient's risk of an unplanned readmission within 30 days of being discharged (0 -100%). The score is based on dignosis, age, lab data, medications, orders, and past utilization.   Low:  0-14.9   Medium: 15-21.9   High: 22-29.9   Extreme: 30 and above       No flowsheet data found.

## 2018-10-09 NOTE — Progress Notes (Signed)
Physical Therapy Treatment Patient Details Name: Ethan Campbell MRN: [id number] DOB: [birth year] Today's Date: 10/09/2018    History of Present Illness 58 yo male admitted for gangrene of R foot and underwent R BKA on 3/13. PMH: DM11, depression, bone cancer, hypothyroidism, arthritis. PSH: amputation of R great toe 06/20/2018, R 5th ray 09/26/2018. Resection of bone tumor in L femur, L TKA..    PT Comments    Patient seen for mobility progression. Pt is pleasant and participatory. This session focused on functional transfers and standing balance. Pt requires min/mod A for mobility this session. Pt requires increased assist this session compared to previous session. Pt presents with generalized weakness and with SOB while mobilizing. Pt will continue to benefit from further skilled PT services in both acute and post acute settings to maximize independence and safety with mobility prior to d/c home.    Follow Up Recommendations  SNF     Equipment Recommendations  Wheelchair (measurements PT);Wheelchair cushion (measurements PT);Rolling walker with 5" wheels;Other (comment)(bariatric )    Recommendations for Other Services       Precautions / Restrictions Precautions Precautions: Fall Precaution Comments: limb protector Restrictions Weight Bearing Restrictions: Yes RLE Weight Bearing: Non weight bearing    Mobility  Bed Mobility Overal bed mobility: Needs Assistance Bed Mobility: Supine to Sit     Supine to sit: Supervision     General bed mobility comments: HOB elevated and use of rails    Transfers Overall transfer level: Needs assistance Equipment used: Rolling walker (2 wheeled) Transfers: Sit to/from [employer] Sit to Stand: Mod assist;From elevated surface Stand pivot transfers: Min assist       General transfer comment: pt stood from EOB and recliner; assist to power up into standing with cues for hand placement/positioning and technique;  elevated bed height needed after attempts X 2; min A for balance when pivoting   Ambulation/Gait             General Gait Details: gait deferred for pt/therapist safety given +1 assist available this session   Stairs             Wheelchair Mobility    Modified Rankin (Stroke Patients Only)       Balance Overall balance assessment: Needs assistance   Sitting balance-Leahy Scale: Good     Standing balance support: Bilateral upper extremity supported Standing balance-Leahy Scale: Poor                              Cognition Arousal/Alertness: Awake/alert Behavior During Therapy: WFL for tasks assessed/performed Overall Cognitive Status: Within Functional Limits for tasks assessed                                        Exercises      General Comments        Pertinent Vitals/Pain Pain Assessment: Faces Faces Pain Scale: Hurts little more Pain Location: R residual limb Pain Descriptors / Indicators: Burning(phantom pains) Pain Intervention(s): Limited activity within patient's tolerance;Monitored during session;Repositioned    Home Living                      Prior Function            PT Goals (current goals can now be found in the care plan section) Progress towards PT goals:  Progressing toward goals    Frequency    Min 4X/week      PT Plan Current plan remains appropriate    Co-evaluation              AM-PAC PT "6 Clicks" Mobility   Outcome Measure  Help needed turning from your back to your side while in a flat bed without using bedrails?: None Help needed moving from lying on your back to sitting on the side of a flat bed without using bedrails?: A Little Help needed moving to and from a bed to a chair (including a wheelchair)?: A Little Help needed standing up from a chair using your arms (e.g., wheelchair or bedside chair)?: A Little Help needed to walk in hospital room?: A Little Help  needed climbing 3-5 steps with a railing? : Total 6 Click Score: 17    End of Session Equipment Utilized During Treatment: Gait belt Activity Tolerance: Patient tolerated treatment well Patient left: with call bell/phone within reach;in chair Nurse Communication: Mobility status PT Visit Diagnosis: Difficulty in walking, not elsewhere classified (R26.2)     Time: 4784-1282 PT Time Calculation (min) (ACUTE ONLY): 36 min  Charges:  $Gait Training: 23-37 mins                     Earney Navy, PTA Acute Rehabilitation Services Pager: [phone] Office: [phone]     Darliss Cheney 10/09/2018, 2:33 PM

## 2018-10-09 NOTE — Progress Notes (Signed)
Pt refusing cpap for the night. RT will continue to monitor as needed. 

## 2018-10-09 NOTE — Plan of Care (Signed)

## 2018-10-09 NOTE — Progress Notes (Signed)
Patient ID: Ethan Campbell, male   DOB: 10-30-1960, 58 y.o.   MRN: [id number] Patient's wound VAC with 300 cc of draining this morning.  Patient still having drainage.  Patient is still unsafe for discharge to home as per PT, however inpatient rehab has signed off stating the patient wants to be discharged to home.  I have re-consult to social worker for discharge to skilled nursing.  I spoke with patient's sister yesterday and she discussed the importance of patient wearing CPAP.  Patient again refused to wear CPAP last night.

## 2018-10-09 NOTE — NC FL2 (Signed)
[place] MEDICAID FL2 LEVEL OF CARE SCREENING TOOL     IDENTIFICATION  Patient Name: Ethan Campbell Birthdate: 01-21-1961 Sex: male Admission Date (Current Location): 10/05/2018  [facility] and [place] Number:  Herbalist and Address:  [facility], [place] [address] [place],  [address]      Provider Number: [id number]  Attending Physician Name and Address:  Newt Minion, MD  Relative Name and Phone Number:  Hilda Blades (spouse) [phone]    Current Level of Care: Hospital Recommended Level of Care: [place] Prior Approval Number:    Date Approved/Denied:   PASRR Number: [id number]  Discharge Plan: SNF    Current Diagnoses: Patient Active Problem List   Diagnosis Date Noted  . Below-knee amputation of right lower extremity ([place]) 10/05/2018  . Gangrene of right foot ([place])   . Amputated toe, right ([place]) 09/26/2018  . Subacute osteomyelitis, right ankle and foot ([place])   . Osteomyelitis of great toe of right foot ([place]) 06/19/2018  . Diabetic polyneuropathy associated with type 2 diabetes mellitus ([place]) 06/19/2018  . Body mass index 45.0-49.9, adult ([place]) 06/19/2018  . Seizure disorder ([place]) 08/27/2015  . Somnolence, daytime 08/27/2015  . Orthostatic hypotension 11/27/2014  . HLD (hyperlipidemia) 11/27/2014  . Syncope and collapse 08/20/2014  . Diabetic autonomic neuropathy associated with type 2 diabetes mellitus ([place]) 05/01/2014  . Dizzy spells 03/17/2014  . Chronic kidney disease, stage III (moderate) ([place]) 08/22/2013  . Type II diabetes mellitus, uncontrolled ([place]) 12/03/2009  . HYPERLIPIDEMIA 12/03/2009  . Morbid obesity ([place]) 12/03/2009  . OBSTRUCTIVE SLEEP APNEA 12/03/2009  . ESSENTIAL HYPERTENSION, BENIGN 12/03/2009  . EDEMA 12/03/2009  . PRECORDIAL PAIN 12/03/2009  . PROTEINURIA 12/03/2009    Orientation RESPIRATION BLADDER Height & Weight     Time, Situation, Place, Self  Normal Continent Weight:  (!) 309 lb 15.5 oz (140.6 kg) Height:  5\' 9"  (175.3 cm)  BEHAVIORAL SYMPTOMS/MOOD NEUROLOGICAL BOWEL NUTRITION STATUS      Continent Diet(see discharge summary)  AMBULATORY STATUS COMMUNICATION OF NEEDS Skin   Extensive Assist Verbally Surgical wounds(right leg amputation with negative pressure wound therapy)                       Personal Care Assistance Level of Assistance  Bathing, Feeding, Dressing, Total care Bathing Assistance: Independent Feeding assistance: Independent Dressing Assistance: Independent Total Care Assistance: Maximum assistance   Functional Limitations Info  Sight, Hearing, Speech Sight Info: Adequate Hearing Info: Adequate Speech Info: Adequate    SPECIAL CARE FACTORS FREQUENCY  PT (By licensed PT), OT (By licensed OT)     PT Frequency: min 5x weekly OT Frequency: min 5x weekly            Contractures Contractures Info: Not present    Additional Factors Info  Code Status, Allergies Code Status Info: full Allergies Info:  no known allergies           Current Medications (10/09/2018):  This is the current hospital active medication list Current Facility-Administered Medications  Medication Dose Route Frequency Provider Last Rate Last Dose  . 0.9 %  sodium chloride infusion (Manually program via Guardrails IV Fluids)   Intravenous Once Newt Minion, MD      . 0.9 %  sodium chloride infusion   Intravenous Continuous Newt Minion, MD 10 mL/hr at 10/05/18 1711    . acetaminophen (TYLENOL) tablet 325-650 mg  325-650 mg Oral Q6H PRN Newt Minion,  MD   650 mg at 10/08/18 1553  . aspirin EC tablet 81 mg  81 mg Oral Daily Newt Minion, MD   81 mg at 10/09/18 0908  . atorvastatin (LIPITOR) tablet 80 mg  80 mg Oral Daily Newt Minion, MD   80 mg at 10/09/18 0908  . benazepril (LOTENSIN) tablet 20 mg  20 mg Oral Daily Newt Minion, MD   20 mg at 10/09/18 0909  . bisacodyl (DULCOLAX) suppository 10 mg  10 mg Rectal Daily PRN Newt Minion,  MD      . docusate sodium (COLACE) capsule 100 mg  100 mg Oral BID Newt Minion, MD   100 mg at 10/09/18 0908  . DULoxetine (CYMBALTA) DR capsule 60 mg  60 mg Oral Daily Newt Minion, MD   60 mg at 10/09/18 0908  . fenofibrate tablet 54 mg  54 mg Oral Daily Newt Minion, MD   54 mg at 10/09/18 0908  . FLUoxetine (PROZAC) capsule 20 mg  20 mg Oral Daily Newt Minion, MD   20 mg at 10/09/18 0908  . furosemide (LASIX) tablet 40 mg  40 mg Oral q1800 Newt Minion, MD   40 mg at 10/08/18 1724  . furosemide (LASIX) tablet 80 mg  80 mg Oral Daily Newt Minion, MD   80 mg at 10/09/18 0908  . gabapentin (NEURONTIN) capsule 300 mg  300 mg Oral BID Newt Minion, MD   300 mg at 10/09/18 0908  . HYDROmorphone (DILAUDID) injection 0.5-1 mg  0.5-1 mg Intravenous Q4H PRN Newt Minion, MD   1 mg at 10/08/18 0605  . insulin pump   Subcutaneous TID AC, HS, 0200 Newt Minion, MD      . levothyroxine (SYNTHROID, LEVOTHROID) tablet 100 mcg  100 mcg Oral Q0600 Newt Minion, MD   100 mcg at 10/09/18 0606  . magnesium citrate solution 1 Bottle  1 Bottle Oral Once PRN Newt Minion, MD      . meclizine (ANTIVERT) tablet 25 mg  25 mg Oral TID PRN Newt Minion, MD      . methocarbamol (ROBAXIN) tablet 500 mg  500 mg Oral Q6H PRN Newt Minion, MD   500 mg at 10/09/18 6333   Or  . methocarbamol (ROBAXIN) 500 mg in dextrose 5 % 50 mL IVPB  500 mg Intravenous Q6H PRN Newt Minion, MD      . metoCLOPramide (REGLAN) tablet 5-10 mg  5-10 mg Oral Q8H PRN Newt Minion, MD       Or  . metoCLOPramide (REGLAN) injection 5-10 mg  5-10 mg Intravenous Q8H PRN Newt Minion, MD      . midodrine (PROAMATINE) tablet 5 mg  5 mg Oral TID WC Newt Minion, MD   5 mg at 10/09/18 1240  . ondansetron (ZOFRAN) tablet 4 mg  4 mg Oral Q6H PRN Newt Minion, MD       Or  . ondansetron [facility]) injection 4 mg  4 mg Intravenous Q6H PRN Newt Minion, MD      . oxyCODONE (Oxy IR/ROXICODONE) immediate release tablet  10-15 mg  10-15 mg Oral Q4H PRN Newt Minion, MD   15 mg at 10/09/18 0612  . oxyCODONE (Oxy IR/ROXICODONE) immediate release tablet 5-10 mg  5-10 mg Oral Q4H PRN Newt Minion, MD   10 mg at 10/06/18 2211  . polyethylene glycol (MIRALAX / GLYCOLAX)  packet 17 g  17 g Oral Daily PRN Newt Minion, MD   17 g at 10/07/18 0935  . tamsulosin (FLOMAX) capsule 0.4 mg  0.4 mg Oral Daily Newt Minion, MD   0.4 mg at 10/09/18 0908  . tiZANidine (ZANAFLEX) tablet 4 mg  4 mg Oral BID Newt Minion, MD   4 mg at 10/09/18 0908  . warfarin (COUMADIN) tablet 7.5 mg  7.5 mg Oral ONCE-1800 Wynell Balloon, RPH      . Warfarin - Pharmacist Dosing Inpatient   Does not apply q1800 Tyrone Apple, [facility]         Discharge Medications: Please see discharge summary for a list of discharge medications.  Relevant Imaging Results:  Relevant Lab Results:   Additional Information SSN: [id number]  Alberteen Sam, LCSW

## 2018-10-09 NOTE — Progress Notes (Signed)
ANTICOAGULATION CONSULT NOTE Pharmacy Consult:  Coumadin Indication: Afib  No Known Allergies  Patient Measurements: Height: 5\' 9"  (175.3 cm) Weight: (!) 309 lb 15.5 oz (140.6 kg) IBW/kg (Calculated) : 70.7  Vital Signs: Temp: 98.9 F (37.2 C) (03/17 0100) Temp Source: Oral (03/17 0100) BP: 147/88 (03/17 0100) Pulse Rate: 85 (03/17 0100)  Labs: Recent Labs    10/07/18 0557 10/08/18 0429 10/08/18 1900 10/09/18 0507  HGB  --  6.5* 7.8*  --   HCT  --  21.4* 23.8*  --   PLT  --  533* 451*  --   LABPROT 27.3* 21.9*  --  20.2*  INR 2.6* 1.9*  --  1.8*    Estimated Creatinine Clearance: 48.2 mL/min (A) (by C-G formula based on SCr of 2.36 mg/dL (H)).   Assessment: 69 YOM admitted for transtibial amputation of right foot due to gangrene.  Pharmacy consulted to dose Coumadin for history of Afib.  He also has a history of LUE DVT in 2017.  Anticoag: Coumadin for VTE px. On Coumadin PTA for afib and hx DVT LUE (02/2016). Last INR 2.8 on 3/5. Rx dosed Coumadin 3/4 then discharged on 3/5.  Readmitted 3/13 for transtibial amputation of right foot d/t gangrene; wound VAC applied; No bleeding noted  INR today = 1.8   PTA Coumadin regimen: 7.5 mg daily except 10 mg on Mon & Fri  - last dose 3/11  Heme/Onc: H&H 7.8/23.8, plt 451  Goal of Therapy:  INR 2-3 Monitor platelets by anticoagulation protocol: Yes   Plan:  Warfarin 7.5 mg po x 1 Daily INR  Levester Fresh, PharmD, BCPS, BCCCP Clinical Pharmacist [phone]  Please check AMION for all [place] numbers  10/09/2018 8:30 AM

## 2018-10-09 NOTE — Plan of Care (Signed)
  Problem: Pain Managment: Goal: General experience of comfort will improve Outcome: Progressing   Problem: Safety: Goal: Ability to remain free from injury will improve Outcome: Progressing   Problem: Skin Integrity: Goal: Risk for impaired skin integrity will decrease Outcome: Progressing   

## 2018-10-09 NOTE — TOC Initial Note (Signed)
Transition of Care [facility]) - Initial/Assessment Note    Patient Details  Name: Ethan Campbell MRN: [id number] Date of Birth: Jul 22, 1961  [facility] of [facility]) CM/SW Contact:    Alberteen Sam, LCSW Phone Number: 10/09/2018, 12:53 PM  Clinical Narrative:                  CSW met with patient who reports after speaking to Dr. Sharol Given he is willing to give up his medicaid check and stay at [facility] for 30 days per requirements of medicaid. Patient reported preferences of a SNF in [place] location. CSW will provide [employer] of medicaid options for patient to choose from.  Expected Discharge Plan: Skilled Nursing Facility Barriers to Discharge: SNF Pending Medicaid   Patient Goals and CMS Choice Patient states their goals for this hospitalization and ongoing recovery are:: patient reports he will go to SNF before going home now CMS Medicare.gov Compare Post Acute Care list provided to:: Patient Choice offered to / list presented to : Patient  Expected Discharge Plan and Services Expected Discharge Plan: [place] Discharge Planning Services: NA Post Acute Care Choice: [place] Living arrangements for the past 2 months: Single Family Home                 DME Arranged: N/A DME Agency: NA HH Arranged: NA HH Agency: NA  Prior Living Arrangements/Services Living arrangements for the past 2 months: [place] Lives with:: Self Patient language and need for interpreter reviewed:: Yes Do you feel safe going back to the place where you live?: No   patient needs assistance with mobility  Need for Family Participation in Patient Care: No (Comment) Care giver support system in place?: Yes (comment) Current home services: DME Criminal Activity/Legal Involvement Pertinent to Current Situation/Hospitalization: No - Comment as needed  Activities of Daily Living Home Assistive Devices/Equipment: CBG Meter, Crutches, Raised toilet seat with rails, Shower chair  without back ADL Screening (condition at time of admission) Patient's cognitive ability adequate to safely complete daily activities?: Yes Is the patient deaf or have difficulty hearing?: No Does the patient have difficulty seeing, even when wearing glasses/contacts?: No Does the patient have difficulty concentrating, remembering, or making decisions?: Yes Patient able to express need for assistance with ADLs?: Yes Does the patient have difficulty dressing or bathing?: No Independently performs ADLs?: Yes (appropriate for developmental age) Does the patient have difficulty walking or climbing stairs?: No Weakness of Legs: Both Weakness of Arms/Hands: None  Permission Sought/Granted Permission sought to share information with : Case Manager, Customer service manager, Family Supports Permission granted to share information with : Yes, Verbal Permission Granted  Share Information with NAME: Hilda Blades  Permission granted to share info w AGENCY: SNFs  Permission granted to share info w Relationship: spouse  Permission granted to share info w Contact Information: [phone]  Emotional Assessment Appearance:: Appears stated age Attitude/Demeanor/Rapport: Engaged Affect (typically observed): Accepting Orientation: : Oriented to Self, Oriented to Place, Oriented to  Time, Oriented to Situation Alcohol / Substance Use: Not Applicable Psych Involvement: No (comment)  Admission diagnosis:  Gangrene Right Foot Patient Active Problem List   Diagnosis Date Noted  . Below-knee amputation of right lower extremity ([place]) 10/05/2018  . Gangrene of right foot ([place])   . Amputated toe, right ([place]) 09/26/2018  . Subacute osteomyelitis, right ankle and foot ([place])   . Osteomyelitis of great toe of right foot ([place]) 06/19/2018  . Diabetic polyneuropathy associated with type 2  diabetes mellitus ([place]) 06/19/2018  . Body mass index 45.0-49.9, adult ([place]) 06/19/2018  . Seizure disorder ([place]) 08/27/2015   . Somnolence, daytime 08/27/2015  . Orthostatic hypotension 11/27/2014  . HLD (hyperlipidemia) 11/27/2014  . Syncope and collapse 08/20/2014  . Diabetic autonomic neuropathy associated with type 2 diabetes mellitus ([place]) 05/01/2014  . Dizzy spells 03/17/2014  . Chronic kidney disease, stage III (moderate) ([place]) 08/22/2013  . Type II diabetes mellitus, uncontrolled ([place]) 12/03/2009  . HYPERLIPIDEMIA 12/03/2009  . Morbid obesity ([place]) 12/03/2009  . OBSTRUCTIVE SLEEP APNEA 12/03/2009  . ESSENTIAL HYPERTENSION, BENIGN 12/03/2009  . EDEMA 12/03/2009  . PRECORDIAL PAIN 12/03/2009  . PROTEINURIA 12/03/2009   PCP:  Arsenio Katz, NP Pharmacy:   [facility], [place] [address] [place] [address] Phone: [phone] Fax: [phone]  [facility], [place] [place] [address] Phone: [phone] Fax: [phone]     Social Determinants of Health (SDOH) Interventions    Readmission Risk Interventions 30 Day Unplanned Readmission Risk Score     Admission (Current) from 10/05/2018 in [place]  30 Day Unplanned Readmission Risk Score (%)  15 Filed at 10/09/2018 1200     This score is the patient's risk of an unplanned readmission within 30 days of being discharged (0 -100%). The score is based on dignosis, age, lab data, medications, orders, and past utilization.   Low:  0-14.9   Medium: 15-21.9   High: 22-29.9   Extreme: 30 and above       No flowsheet data found.

## 2018-10-10 LAB — PROTIME-INR
INR: 1.7 — ABNORMAL HIGH (ref 0.8–1.2)
Prothrombin Time: 20.1 seconds — ABNORMAL HIGH (ref 11.4–15.2)

## 2018-10-10 LAB — GLUCOSE, CAPILLARY
GLUCOSE-CAPILLARY: 235 mg/dL — AB (ref 70–99)
Glucose-Capillary: 226 mg/dL — ABNORMAL HIGH (ref 70–99)

## 2018-10-10 MED ORDER — OXYCODONE-ACETAMINOPHEN 5-325 MG PO TABS: 1.0000 | ORAL_TABLET | ORAL | 0 refills | Status: DC | PRN

## 2018-10-10 NOTE — Discharge Summary (Signed)
Discharge Diagnoses:  Active Problems:   Gangrene of right foot ([place])   Below-knee amputation of right lower extremity ([place])   Surgeries: Procedure(s): RIGHT BELOW KNEE AMPUTATION on 10/05/2018    Consultants:   Discharged Condition: Improved  Hospital Course: Ethan Campbell is an 58 y.o. male who was admitted 10/05/2018 with a chief complaint of gangrene right foot, with a final diagnosis of Gangrene Right Foot.  Patient was brought to the operating room on 10/05/2018 and underwent Procedure(s): RIGHT BELOW KNEE AMPUTATION.    Patient was given perioperative antibiotics:  Anti-infectives (From admission, onward)   Start     Dose/Rate Route Frequency Ordered Stop   10/05/18 1800  ceFAZolin (ANCEF) IVPB 2g/100 mL premix     2 g 200 mL/hr over 30 Minutes Intravenous Every 6 hours 10/05/18 1451 10/06/18 0642   10/05/18 0945  ceFAZolin (ANCEF) IVPB 2g/100 mL premix     2 g 200 mL/hr over 30 Minutes Intravenous On call to O.R. 10/05/18 0762 10/05/18 1153    .  Patient was given sequential compression devices, early ambulation, and aspirin for DVT prophylaxis.  Recent vital signs:  Patient Vitals for the past 24 hrs:  BP Temp Temp src Pulse Resp SpO2  10/10/18 0430 (!) 135/58 - - 79 15 95 %  10/10/18 0417 (!) 135/58 98.7 F (37.1 C) Oral 79 15 95 %  10/09/18 2017 (!) 124/56 98.7 F (37.1 C) Oral 76 14 98 %  10/09/18 1405 105/61 (!) 100.6 F (38.1 C) Oral 75 17 99 %  .  Recent laboratory studies: No results found.  Discharge Medications:   Allergies as of 10/10/2018   No Known Allergies     Medication List    STOP taking these medications   silver sulfADIAZINE 1 % cream Commonly known as:  Silvadene   sulfamethoxazole-trimethoprim 800-160 MG tablet Commonly known as:  BACTRIM DS,SEPTRA DS     TAKE these medications   aspirin EC 81 MG tablet Take 81 mg by mouth daily.   atorvastatin 80 MG tablet Commonly known as:  LIPITOR TAKE 1 TABLET DAILY.   benazepril  20 MG tablet Commonly known as:  LOTENSIN Take 20 mg by mouth daily.   DULoxetine 60 MG capsule Commonly known as:  CYMBALTA Take 60 mg by mouth daily.   fenofibrate 145 MG tablet Commonly known as:  TRICOR Take 145 mg by mouth daily.   FLUoxetine 20 MG capsule Commonly known as:  PROZAC Take 20 mg by mouth daily.   furosemide 80 MG tablet Commonly known as:  LASIX Take 40-80 mg by mouth See admin instructions. Takes one tablet in the morning and one-half tablet in the evening   gabapentin 300 MG capsule Commonly known as:  NEURONTIN Take 300 mg by mouth 2 (two) times daily.   glucose blood test strip Commonly known as:  Accu-Chek Aviva Plus TEST UP TO 4 TIMES DAILY Dx code E11.65   glucose blood test strip Commonly known as:  FREESTYLE LITE Check sugar 3 times daily   HUMULIN R 500 UNIT/ML injection Generic drug:  insulin regular human CONCENTRATED USE AS DIRECTED, INJECT UP TO 30 UNITS 3 TIMES A DAY. What changed:  See the new instructions.   levothyroxine 100 MCG tablet Commonly known as:  SYNTHROID, LEVOTHROID Take 1 tablet daily before breakfast What changed:    how much to take  how to take this  when to take this   liraglutide 18 MG/3ML Sopn Commonly known as:  Victoza USE 1.8MG  SUBCUTANEOUSLY DAILY. What changed:    how much to take  how to take this  when to take this   meclizine 25 MG tablet Commonly known as:  ANTIVERT Take 25 mg by mouth 3 (three) times daily as needed for dizziness.   midodrine 5 MG tablet Commonly known as:  PROAMATINE Take 5 mg by mouth 3 (three) times daily with meals.   OmniPod 5 Pack Misc 1 Package by Does not apply route every 3 (three) days.   oxyCODONE-acetaminophen 5-325 MG tablet Commonly known as:  PERCOCET/ROXICET Take 1 tablet by mouth every 4 (four) hours as needed.   tamsulosin 0.4 MG Caps capsule Commonly known as:  FLOMAX Take 0.4 mg by mouth daily.   tiZANidine 4 MG tablet Commonly known as:   ZANAFLEX Take 4 mg by mouth 2 (two) times daily.   warfarin 10 MG tablet Commonly known as:  COUMADIN Take 10 mg by mouth See admin instructions. Mon and Fri   warfarin 7.5 MG tablet Commonly known as:  COUMADIN Take 7.5 mg by mouth See admin instructions. Su Tu W Th Sa       Diagnostic Studies: No results found.  Patient benefited maximally from their hospital stay and there were no complications.     Disposition: Discharge disposition: 03-Skilled Nursing Facility      Discharge Instructions    Call MD / Call 911   Complete by:  As directed    If you experience chest pain or shortness of breath, CALL 911 and be transported to the hospital emergency room.  If you develope a fever above 101 F, pus (white drainage) or increased drainage or redness at the wound, or calf pain, call your surgeon's office.   Constipation Prevention   Complete by:  As directed    Drink plenty of fluids.  Prune juice may be helpful.  You may use a stool softener, such as Colace (over the counter) 100 mg twice a day.  Use MiraLax (over the counter) for constipation as needed.   Diet - low sodium heart healthy   Complete by:  As directed    Increase activity slowly as tolerated   Complete by:  As directed    Negative Pressure Wound Therapy - Incisional   Complete by:  As directed    Attached patient's wound VAC dressing to the portable Praveena wound VAC pump.  This should be plugged in to maintain its charge.  Continue wound VAC dressing for 1 week after discharge.     Follow-up Information    Newt Minion, MD In 1 week.   Specialty:  Orthopedic Surgery Contact information: [place] [address] [phone]        [facility] Follow up.   Specialty:  [place] Why:  A representative from [place] will contact you to arrange start date and time for your therapy.           Signed: Newt Minion 10/10/2018, 7:35  AM

## 2018-10-10 NOTE — Plan of Care (Signed)

## 2018-10-10 NOTE — Progress Notes (Signed)
Discharge paperwork given to PTAR for receiving facility. Switched from wound vac to portable praveena. Pt is not in distress and tolerated well.

## 2018-10-10 NOTE — Progress Notes (Signed)
Physical Therapy Treatment Patient Details Name: Ethan Campbell MRN: [id number] DOB: 06-21-1961 Today's Date: 10/10/2018    History of Present Illness 58 yo male admitted for gangrene of R foot and underwent R BKA on 3/13. PMH: DM11, depression, bone cancer, hypothyroidism, arthritis. PSH: amputation of R great toe 06/20/2018, R 5th ray 09/26/2018. Resection of bone tumor in L femur, L TKA..    PT Comments    Patient seen for mobility progression. Pt reports feeling better this am and much less SOB with mobility this session. Pt tolerated short distance gait training and therex well. Continue to progress as tolerated with anticipated d/c to SNF for further skilled PT services.     Follow Up Recommendations  SNF     Equipment Recommendations  (TBD next venue)    Recommendations for Other Services       Precautions / Restrictions Precautions Precautions: Fall Precaution Comments: limb protector Restrictions Weight Bearing Restrictions: Yes RLE Weight Bearing: Non weight bearing    Mobility  Bed Mobility Overal bed mobility: Modified Independent Bed Mobility: Supine to Sit           General bed mobility comments: HOB elevated and use of rails  Transfers Overall transfer level: Needs assistance Equipment used: Rolling walker (2 wheeled) Transfers: Sit to/from Stand Sit to Stand: From elevated surface;Min assist         General transfer comment: cues for safe hand placement and assistance to power up into standing  Ambulation/Gait Ambulation/Gait assistance: Min assist;Mod assist Gait Distance (Feet): 20 Feet Assistive device: Rolling walker (2 wheeled) Gait Pattern/deviations: Step-to pattern Gait velocity: decreased   General Gait Details: pt with less SOB with mobility; assistance for balance    Stairs             Wheelchair Mobility    Modified Rankin (Stroke Patients Only)       Balance Overall balance assessment: Needs assistance    Sitting balance-Leahy Scale: Good     Standing balance support: Bilateral upper extremity supported Standing balance-Leahy Scale: Poor                              Cognition Arousal/Alertness: Awake/alert Behavior During Therapy: WFL for tasks assessed/performed Overall Cognitive Status: Within Functional Limits for tasks assessed                                        Exercises Amputee Exercises Quad Sets: AROM;20 reps Hip ABduction/ADduction: AROM;20 reps;Other (comment)(isometric hold) Hip Flexion/Marching: AROM;20 reps Knee Extension: AROM;20 reps Straight Leg Raises: AROM;20 reps    General Comments        Pertinent Vitals/Pain Pain Assessment: Faces Faces Pain Scale: Hurts a little bit Pain Location: R residual limb Pain Descriptors / Indicators: Burning Pain Intervention(s): Limited activity within patient's tolerance;Monitored during session;Repositioned    Home Living                      Prior Function            PT Goals (current goals can now be found in the care plan section) Progress towards PT goals: Progressing toward goals    Frequency    Min 4X/week      PT Plan Current plan remains appropriate    Co-evaluation  AM-PAC PT "6 Clicks" Mobility   Outcome Measure  Help needed turning from your back to your side while in a flat bed without using bedrails?: A Little Help needed moving from lying on your back to sitting on the side of a flat bed without using bedrails?: A Little Help needed moving to and from a bed to a chair (including a wheelchair)?: A Little Help needed standing up from a chair using your arms (e.g., wheelchair or bedside chair)?: A Little Help needed to walk in hospital room?: A Little Help needed climbing 3-5 steps with a railing? : Total 6 Click Score: 16    End of Session Equipment Utilized During Treatment: Gait belt Activity Tolerance: Patient tolerated  treatment well Patient left: with call bell/phone within reach;in chair Nurse Communication: Mobility status PT Visit Diagnosis: Difficulty in walking, not elsewhere classified (R26.2)     Time: 1610-9604 PT Time Calculation (min) (ACUTE ONLY): 26 min  Charges:  $Gait Training: 8-22 mins $Therapeutic Exercise: 8-22 mins                     Earney Navy, PTA Acute Rehabilitation Services Pager: [phone] Office: [phone]     Darliss Cheney 10/10/2018, 10:21 AM

## 2018-10-10 NOTE — TOC Transition Note (Signed)
Transition of Care [facility]) - CM/SW Discharge Note   Patient Details  Name: Ethan Campbell MRN: [id number] Date of Birth: [birth year]  [facility] CM/SW Contact:  Alberteen Sam, LCSW Phone Number: 10/10/2018, 10:47 AM   Clinical Narrative:     Patient will DC to: [facility] Anticipated DC date: 10/10/2018 Family notified:patient will notify Transport [id number]  Per MD patient ready for DC to [facility] . RN, patient, patient's family, and facility notified of DC. Discharge Summary sent to facility. RN given number for report [phone]. DC packet on chart. Ambulance transport requested for patient.  CSW signing off.  [place], [place]   Final next level of care: Skilled Nursing Facility Barriers to Discharge: No Barriers Identified   Patient Goals and CMS Choice Patient states their goals for this hospitalization and ongoing recovery are:: patient wants to go to SNF and then go home CMS Medicare.gov Compare Post Acute Care list provided to:: Patient Choice offered to / list presented to : Patient  Discharge Placement PASRR number recieved: 10/09/18            Patient chooses bed at: [facility] Patient to be transferred to facility by: [place] Name of family member notified: patient will notify Patient and family notified of of transfer: 10/10/18  Discharge Plan and Services Discharge Planning Services: NA Post Acute Care Choice: [place]          DME Arranged: N/A DME Agency: NA HH Arranged: NA HH Agency: NA   Social Determinants of Health (SDOH) Interventions     Readmission Risk Interventions No flowsheet data found.

## 2018-10-12 ENCOUNTER — Telehealth (INDEPENDENT_AMBULATORY_CARE_PROVIDER_SITE_OTHER): Payer: Self-pay | Admitting: Orthopedic Surgery

## 2018-10-12 NOTE — Telephone Encounter (Signed)
Pharm called asking if Ethan Campbell is still going to need the knee scooter

## 2018-10-15 ENCOUNTER — Telehealth (INDEPENDENT_AMBULATORY_CARE_PROVIDER_SITE_OTHER): Payer: Self-pay | Admitting: Radiology

## 2018-10-15 NOTE — Telephone Encounter (Signed)
I have called patient and asked pre-screening questions, spoke with wife Hilda Blades.  Do you have now or have you had in the past 7 days a fever and/or chills? NO Do you have now or have you had in the past 7 days a cough? NO Do you have now or have you had in the last 7 days nausea, vomiting or abdominal pain? NO Have you been exposed to anyone who has tested positive for COVID-19? NO Have you or anyone who lives with you traveled within the last month? NO  Wife does have allergies currently sneezing and coughing, no fever. Advised she will possibly be asked to wait in car during patients visit.

## 2018-10-16 ENCOUNTER — Other Ambulatory Visit: Payer: Self-pay

## 2018-10-16 ENCOUNTER — Ambulatory Visit (INDEPENDENT_AMBULATORY_CARE_PROVIDER_SITE_OTHER): Payer: Medicaid Other | Admitting: Orthopedic Surgery

## 2018-10-16 ENCOUNTER — Encounter (INDEPENDENT_AMBULATORY_CARE_PROVIDER_SITE_OTHER): Payer: Self-pay | Admitting: Orthopedic Surgery

## 2018-10-16 VITALS — Ht 69.0 in | Wt 309.0 lb

## 2018-10-16 DIAGNOSIS — Z89421 Acquired absence of other right toe(s): Secondary | ICD-10-CM

## 2018-10-16 DIAGNOSIS — S88111A Complete traumatic amputation at level between knee and ankle, right lower leg, initial encounter: Secondary | ICD-10-CM

## 2018-10-16 MED ORDER — DOXYCYCLINE HYCLATE 100 MG PO TABS: 100.0000 mg | ORAL_TABLET | Freq: Two times a day (BID) | ORAL | 0 refills | Status: DC

## 2018-10-16 NOTE — Telephone Encounter (Signed)
The pt is now a BKA

## 2018-10-17 ENCOUNTER — Encounter (INDEPENDENT_AMBULATORY_CARE_PROVIDER_SITE_OTHER): Payer: Self-pay | Admitting: Orthopedic Surgery

## 2018-10-17 NOTE — Progress Notes (Signed)
Office Visit Note   Patient: Ethan Campbell           Date of Birth: [birth year]           MRN: [id number] Visit Date: 10/16/2018              Requested by: Arsenio Katz, NP [place], [place] [address] PCP: Arsenio Katz, NP  Chief Complaint  Patient presents with  . Right Leg - Routine Post Op      HPI: Patient is a 58 year old gentleman who presents in follow-up status post right transtibial amputation.  Patient states the wound VAC began alarming 3 days ago and the facility did not fix of the wound VAC.  The vac has not been working for at least 3 days.  Patient complains of increased swelling in the right lower extremity.  Assessment & Plan: Visit Diagnoses:  1. Below-knee amputation of right lower extremity ([place])     Plan: Patient was given instructions for dry dressing change daily elevation where the compression shrinker on top of the dry dressing.  Prescription for doxycycline.  Follow-Up Instructions: Return in about 1 week (around 10/23/2018).   Ortho Exam  Patient is alert, oriented, no adenopathy, well-dressed, normal affect, normal respiratory effort. Examination patient has massive swelling of the right transtibial amputation with blistering of the skin.  There is healthy epithelialization beneath the blisters.  There is no purulent drainage no necrotic changes the wound edges are well approximated.  Imaging: No results found. No images are attached to the encounter.  Labs: Lab Results  Component Value Date   HGBA1C 6.9 (A) 06/15/2018   HGBA1C 10.9 (A) 02/21/2018   HGBA1C 13.4 09/11/2017     Lab Results  Component Value Date   ALBUMIN 3.9 09/30/2015   ALBUMIN 4.1 09/03/2015   ALBUMIN 4.0 04/02/2014    Body mass index is 45.63 kg/m.  Orders:  No orders of the defined types were placed in this encounter.  Meds ordered this encounter  Medications  . doxycycline (VIBRA-TABS) 100 MG tablet    Sig: Take 1 tablet (100 mg total) by mouth 2  (two) times daily.    Dispense:  60 tablet    Refill:  0     Procedures: No procedures performed  Clinical Data: No additional findings.  ROS:  All other systems negative, except as noted in the HPI. Review of Systems  Objective: Vital Signs: Ht _0  (1.753 m)   Wt (!) 309 lb (140.2 kg)   BMI 45.63 kg/m   Specialty Comments:  No specialty comments available.  PMFS History: Patient Active Problem List   Diagnosis Date Noted  . Below-knee amputation of right lower extremity ([place]) 10/05/2018  . Gangrene of right foot ([place])   . Amputated toe, right ([place]) 09/26/2018  . Subacute osteomyelitis, right ankle and foot ([place])   . Osteomyelitis of great toe of right foot ([place]) 06/19/2018  . Diabetic polyneuropathy associated with type 2 diabetes mellitus ([place]) 06/19/2018  . Body mass index 45.0-49.9, adult ([place]) 06/19/2018  . Seizure disorder ([place]) 08/27/2015  . Somnolence, daytime 08/27/2015  . Orthostatic hypotension 11/27/2014  . HLD (hyperlipidemia) 11/27/2014  . Syncope and collapse 08/20/2014  . Diabetic autonomic neuropathy associated with type 2 diabetes mellitus ([place]) 05/01/2014  . Dizzy spells 03/17/2014  . Chronic kidney disease, stage III (moderate) ([place]) 08/22/2013  . Type II diabetes mellitus, uncontrolled ([place]) 12/03/2009  . HYPERLIPIDEMIA 12/03/2009  . Morbid obesity ([place]) 12/03/2009  .  OBSTRUCTIVE SLEEP APNEA 12/03/2009  . ESSENTIAL HYPERTENSION, BENIGN 12/03/2009  . EDEMA 12/03/2009  . PRECORDIAL PAIN 12/03/2009  . PROTEINURIA 12/03/2009   Past Medical History:  Diagnosis Date  . Arm vein blood clot, left    on Coumadin  . Arthritis   . Cancer ([place])    Bone cancer 1987 (in knee Large cell tumor)  . Chronic kidney disease    stage 3  . Depression   . Essential hypertension   . History of stroke   . Hyperlipidemia   . Hypothyroidism   . Insomnia   . Obesity   . Precordial pain June 2011   Nuclear stress; no ischemia; EF 60%  . Presence of  permanent cardiac pacemaker   . Proteinuria   . Seizure disorder ([place])   . Sick sinus syndrome ([place])   . Sleep apnea    Dr. Brandon Melnick, uses bipap  . Syncope   . Type 2 diabetes mellitus ([place])   . Venous insufficiency     Family History  Problem Relation Age of Onset  . Heart attack Mother 76  . Breast cancer Mother   . Stroke Mother   . Heart attack Father 23  . Breast cancer Sister   . Colon cancer Sister     Past Surgical History:  Procedure Laterality Date  . AMPUTATION Right 06/20/2018   Procedure: RIGHT GREAT TOE AMPUTATION;  Surgeon: Newt Minion, MD;  Location: [place];  Service: Orthopedics;  Laterality: Right;  . AMPUTATION Right 09/26/2018   Procedure: RIGHT FOOT 5TH RAY AMPUTATION;  Surgeon: Newt Minion, MD;  Location: [facility];  Service: Orthopedics;  Laterality: Right;  MAC and regional anesthesia  . AMPUTATION Right 10/05/2018   Procedure: RIGHT BELOW KNEE AMPUTATION;  Surgeon: Newt Minion, MD;  Location: [place];  Service: Orthopedics;  Laterality: Right;  . COLONOSCOPY    . RESECTION BONE TUMOR FEMUR  1980's   Left femur, treated at [facility] with bone graft  . TOTAL KNEE ARTHROPLASTY Left 2013   Social History   Occupational History  . Occupation: Disabled  Tobacco Use  . Smoking status: Former Smoker    Types: Cigarettes  . Smokeless tobacco: Never Used  . Tobacco comment: 11/27/14 "quit smoking years ago"  Substance and Sexual Activity  . Alcohol use: No    Alcohol/week: 0.0 standard drinks  . Drug use: No  . Sexual activity: Never

## 2018-10-22 ENCOUNTER — Other Ambulatory Visit: Payer: Self-pay

## 2018-10-22 ENCOUNTER — Inpatient Hospital Stay (HOSPITAL_COMMUNITY)
Admission: EM | Admit: 2018-10-22 | Discharge: 2018-10-31 | DRG: 474 | Disposition: A | Discharge status: Home Health | Payer: Medicaid Other | Source: Skilled Nursing Facility | Attending: Internal Medicine | Admitting: Internal Medicine

## 2018-10-22 ENCOUNTER — Telehealth (INDEPENDENT_AMBULATORY_CARE_PROVIDER_SITE_OTHER): Payer: Self-pay

## 2018-10-22 ENCOUNTER — Encounter (HOSPITAL_COMMUNITY): Payer: Self-pay

## 2018-10-22 ENCOUNTER — Emergency Department (HOSPITAL_COMMUNITY): Payer: Medicaid Other

## 2018-10-22 ENCOUNTER — Telehealth (INDEPENDENT_AMBULATORY_CARE_PROVIDER_SITE_OTHER): Payer: Self-pay | Admitting: *Deleted

## 2018-10-22 DIAGNOSIS — Z794 Long term (current) use of insulin: Secondary | ICD-10-CM | POA: Diagnosis not present

## 2018-10-22 DIAGNOSIS — D62 Acute posthemorrhagic anemia: Secondary | ICD-10-CM | POA: Diagnosis not present

## 2018-10-22 DIAGNOSIS — A419 Sepsis, unspecified organism: Secondary | ICD-10-CM | POA: Diagnosis present

## 2018-10-22 DIAGNOSIS — I951 Orthostatic hypotension: Secondary | ICD-10-CM | POA: Diagnosis present

## 2018-10-22 DIAGNOSIS — Y835 Amputation of limb(s) as the cause of abnormal reaction of the patient, or of later complication, without mention of misadventure at the time of the procedure: Secondary | ICD-10-CM | POA: Diagnosis present

## 2018-10-22 DIAGNOSIS — I96 Gangrene, not elsewhere classified: Secondary | ICD-10-CM | POA: Diagnosis present

## 2018-10-22 DIAGNOSIS — E860 Dehydration: Secondary | ICD-10-CM | POA: Diagnosis present

## 2018-10-22 DIAGNOSIS — Z86718 Personal history of other venous thrombosis and embolism: Secondary | ICD-10-CM

## 2018-10-22 DIAGNOSIS — I130 Hypertensive heart and chronic kidney disease with heart failure and stage 1 through stage 4 chronic kidney disease, or unspecified chronic kidney disease: Secondary | ICD-10-CM | POA: Diagnosis present

## 2018-10-22 DIAGNOSIS — T8781 Dehiscence of amputation stump: Secondary | ICD-10-CM

## 2018-10-22 DIAGNOSIS — N183 Chronic kidney disease, stage 3 unspecified: Secondary | ICD-10-CM | POA: Diagnosis present

## 2018-10-22 DIAGNOSIS — T8144XA Sepsis following a procedure, initial encounter: Secondary | ICD-10-CM | POA: Diagnosis present

## 2018-10-22 DIAGNOSIS — T8789 Other complications of amputation stump: Secondary | ICD-10-CM | POA: Diagnosis not present

## 2018-10-22 DIAGNOSIS — E1122 Type 2 diabetes mellitus with diabetic chronic kidney disease: Secondary | ICD-10-CM | POA: Diagnosis present

## 2018-10-22 DIAGNOSIS — E785 Hyperlipidemia, unspecified: Secondary | ICD-10-CM | POA: Diagnosis present

## 2018-10-22 DIAGNOSIS — I10 Essential (primary) hypertension: Secondary | ICD-10-CM

## 2018-10-22 DIAGNOSIS — Z950 Presence of cardiac pacemaker: Secondary | ICD-10-CM

## 2018-10-22 DIAGNOSIS — IMO0002 Reserved for concepts with insufficient information to code with codable children: Secondary | ICD-10-CM

## 2018-10-22 DIAGNOSIS — L089 Local infection of the skin and subcutaneous tissue, unspecified: Secondary | ICD-10-CM

## 2018-10-22 DIAGNOSIS — E1142 Type 2 diabetes mellitus with diabetic polyneuropathy: Secondary | ICD-10-CM | POA: Diagnosis present

## 2018-10-22 DIAGNOSIS — Z800 Family history of malignant neoplasm of digestive organs: Secondary | ICD-10-CM

## 2018-10-22 DIAGNOSIS — D631 Anemia in chronic kidney disease: Secondary | ICD-10-CM | POA: Diagnosis present

## 2018-10-22 DIAGNOSIS — S88111A Complete traumatic amputation at level between knee and ankle, right lower leg, initial encounter: Secondary | ICD-10-CM | POA: Diagnosis not present

## 2018-10-22 DIAGNOSIS — M1990 Unspecified osteoarthritis, unspecified site: Secondary | ICD-10-CM | POA: Diagnosis present

## 2018-10-22 DIAGNOSIS — Z20822 Contact with and (suspected) exposure to covid-19: Secondary | ICD-10-CM | POA: Diagnosis present

## 2018-10-22 DIAGNOSIS — I471 Supraventricular tachycardia: Secondary | ICD-10-CM | POA: Diagnosis not present

## 2018-10-22 DIAGNOSIS — E11649 Type 2 diabetes mellitus with hypoglycemia without coma: Secondary | ICD-10-CM | POA: Diagnosis not present

## 2018-10-22 DIAGNOSIS — Z7901 Long term (current) use of anticoagulants: Secondary | ICD-10-CM

## 2018-10-22 DIAGNOSIS — T148XXA Other injury of unspecified body region, initial encounter: Secondary | ICD-10-CM

## 2018-10-22 DIAGNOSIS — I4892 Unspecified atrial flutter: Secondary | ICD-10-CM | POA: Diagnosis not present

## 2018-10-22 DIAGNOSIS — Z803 Family history of malignant neoplasm of breast: Secondary | ICD-10-CM

## 2018-10-22 DIAGNOSIS — I5032 Chronic diastolic (congestive) heart failure: Secondary | ICD-10-CM | POA: Diagnosis present

## 2018-10-22 DIAGNOSIS — D649 Anemia, unspecified: Secondary | ICD-10-CM | POA: Diagnosis not present

## 2018-10-22 DIAGNOSIS — Z6841 Body Mass Index (BMI) 40.0 and over, adult: Secondary | ICD-10-CM | POA: Diagnosis not present

## 2018-10-22 DIAGNOSIS — T8149XA Infection following a procedure, other surgical site, initial encounter: Secondary | ICD-10-CM | POA: Diagnosis present

## 2018-10-22 DIAGNOSIS — Z20828 Contact with and (suspected) exposure to other viral communicable diseases: Secondary | ICD-10-CM | POA: Diagnosis present

## 2018-10-22 DIAGNOSIS — N170 Acute kidney failure with tubular necrosis: Secondary | ICD-10-CM | POA: Diagnosis not present

## 2018-10-22 DIAGNOSIS — Z9641 Presence of insulin pump (external) (internal): Secondary | ICD-10-CM | POA: Diagnosis present

## 2018-10-22 DIAGNOSIS — Z8673 Personal history of transient ischemic attack (TIA), and cerebral infarction without residual deficits: Secondary | ICD-10-CM | POA: Diagnosis not present

## 2018-10-22 DIAGNOSIS — Z79890 Hormone replacement therapy (postmenopausal): Secondary | ICD-10-CM

## 2018-10-22 DIAGNOSIS — I82629 Acute embolism and thrombosis of deep veins of unspecified upper extremity: Secondary | ICD-10-CM | POA: Diagnosis not present

## 2018-10-22 DIAGNOSIS — N179 Acute kidney failure, unspecified: Secondary | ICD-10-CM | POA: Diagnosis not present

## 2018-10-22 DIAGNOSIS — E1152 Type 2 diabetes mellitus with diabetic peripheral angiopathy with gangrene: Secondary | ICD-10-CM | POA: Diagnosis present

## 2018-10-22 DIAGNOSIS — E039 Hypothyroidism, unspecified: Secondary | ICD-10-CM | POA: Diagnosis present

## 2018-10-22 DIAGNOSIS — E875 Hyperkalemia: Secondary | ICD-10-CM | POA: Diagnosis not present

## 2018-10-22 DIAGNOSIS — Z96652 Presence of left artificial knee joint: Secondary | ICD-10-CM | POA: Diagnosis present

## 2018-10-22 DIAGNOSIS — I495 Sick sinus syndrome: Secondary | ICD-10-CM | POA: Diagnosis present

## 2018-10-22 DIAGNOSIS — G40909 Epilepsy, unspecified, not intractable, without status epilepticus: Secondary | ICD-10-CM | POA: Diagnosis present

## 2018-10-22 DIAGNOSIS — I4891 Unspecified atrial fibrillation: Secondary | ICD-10-CM | POA: Diagnosis not present

## 2018-10-22 DIAGNOSIS — I82409 Acute embolism and thrombosis of unspecified deep veins of unspecified lower extremity: Secondary | ICD-10-CM | POA: Diagnosis present

## 2018-10-22 DIAGNOSIS — G4733 Obstructive sleep apnea (adult) (pediatric): Secondary | ICD-10-CM | POA: Diagnosis present

## 2018-10-22 DIAGNOSIS — G4700 Insomnia, unspecified: Secondary | ICD-10-CM | POA: Diagnosis present

## 2018-10-22 DIAGNOSIS — T8743 Infection of amputation stump, right lower extremity: Secondary | ICD-10-CM | POA: Diagnosis not present

## 2018-10-22 DIAGNOSIS — E43 Unspecified severe protein-calorie malnutrition: Secondary | ICD-10-CM | POA: Diagnosis present

## 2018-10-22 DIAGNOSIS — E1165 Type 2 diabetes mellitus with hyperglycemia: Secondary | ICD-10-CM

## 2018-10-22 DIAGNOSIS — Z87891 Personal history of nicotine dependence: Secondary | ICD-10-CM

## 2018-10-22 DIAGNOSIS — Z7982 Long term (current) use of aspirin: Secondary | ICD-10-CM

## 2018-10-22 DIAGNOSIS — Z823 Family history of stroke: Secondary | ICD-10-CM

## 2018-10-22 DIAGNOSIS — Z8249 Family history of ischemic heart disease and other diseases of the circulatory system: Secondary | ICD-10-CM

## 2018-10-22 LAB — SEDIMENTATION RATE: Sed Rate: 130 mm/hr — ABNORMAL HIGH (ref 0–16)

## 2018-10-22 LAB — CBC WITH DIFFERENTIAL/PLATELET
Abs Immature Granulocytes: 0.11 10*3/uL — ABNORMAL HIGH (ref 0.00–0.07)
Basophils Absolute: 0.1 10*3/uL (ref 0.0–0.1)
Basophils Relative: 0 %
EOS PCT: 0 %
Eosinophils Absolute: 0.1 10*3/uL (ref 0.0–0.5)
HCT: 30.2 % — ABNORMAL LOW (ref 39.0–52.0)
Hemoglobin: 8.9 g/dL — ABNORMAL LOW (ref 13.0–17.0)
Immature Granulocytes: 1 %
Lymphocytes Relative: 11 %
Lymphs Abs: 2.1 10*3/uL (ref 0.7–4.0)
MCH: 25.7 pg — AB (ref 26.0–34.0)
MCHC: 29.5 g/dL — AB (ref 30.0–36.0)
MCV: 87.3 fL (ref 80.0–100.0)
Monocytes Absolute: 1.8 10*3/uL — ABNORMAL HIGH (ref 0.1–1.0)
Monocytes Relative: 9 %
Neutro Abs: 14.9 10*3/uL — ABNORMAL HIGH (ref 1.7–7.7)
Neutrophils Relative %: 79 %
Platelets: 680 10*3/uL — ABNORMAL HIGH (ref 150–400)
RBC: 3.46 MIL/uL — ABNORMAL LOW (ref 4.22–5.81)
RDW: 15.3 % (ref 11.5–15.5)
WBC: 18.9 10*3/uL — ABNORMAL HIGH (ref 4.0–10.5)
nRBC: 0 % (ref 0.0–0.2)

## 2018-10-22 LAB — PROCALCITONIN: PROCALCITONIN: 0.18 ng/mL

## 2018-10-22 LAB — LACTIC ACID, PLASMA: Lactic Acid, Venous: 1.1 mmol/L (ref 0.5–1.9)

## 2018-10-22 LAB — COMPREHENSIVE METABOLIC PANEL
ALT: 29 U/L (ref 0–44)
AST: 41 U/L (ref 15–41)
Albumin: 2.4 g/dL — ABNORMAL LOW (ref 3.5–5.0)
Alkaline Phosphatase: 108 U/L (ref 38–126)
Anion gap: 10 (ref 5–15)
BILIRUBIN TOTAL: 0.7 mg/dL (ref 0.3–1.2)
BUN: 81 mg/dL — ABNORMAL HIGH (ref 6–20)
CO2: 28 mmol/L (ref 22–32)
Calcium: 9.1 mg/dL (ref 8.9–10.3)
Chloride: 98 mmol/L (ref 98–111)
Creatinine, Ser: 2.74 mg/dL — ABNORMAL HIGH (ref 0.61–1.24)
GFR calc Af Amer: 29 mL/min — ABNORMAL LOW (ref >60)
GFR calc non Af Amer: 25 mL/min — ABNORMAL LOW (ref >60)
Glucose, Bld: 159 mg/dL — ABNORMAL HIGH (ref 70–99)
Potassium: 4.7 mmol/L (ref 3.5–5.1)
Sodium: 136 mmol/L (ref 135–145)
Total Protein: 7.8 g/dL (ref 6.5–8.1)

## 2018-10-22 LAB — PROTIME-INR
INR: 3 — ABNORMAL HIGH (ref 0.8–1.2)
Prothrombin Time: 30.4 seconds — ABNORMAL HIGH (ref 11.4–15.2)

## 2018-10-22 LAB — GLUCOSE, CAPILLARY: Glucose-Capillary: 93 mg/dL (ref 70–99)

## 2018-10-22 LAB — C-REACTIVE PROTEIN: CRP: 15.1 mg/dL — ABNORMAL HIGH (ref <1.0)

## 2018-10-22 LAB — APTT: aPTT: 67 seconds — ABNORMAL HIGH (ref 24–36)

## 2018-10-22 LAB — BRAIN NATRIURETIC PEPTIDE: B NATRIURETIC PEPTIDE 5: 66.1 pg/mL (ref 0.0–100.0)

## 2018-10-22 MED ORDER — VITAMIN C 500 MG PO TABS
500.0000 mg | ORAL_TABLET | Freq: Two times a day (BID) | ORAL | Status: DC
  Administered 2018-10-22 – 2018-10-31 (×17): 500 mg via ORAL
  Filled 2018-10-22 (×17): qty 1

## 2018-10-22 MED ORDER — SODIUM CHLORIDE 0.9 % IV SOLN
2.0000 g | INTRAVENOUS | Status: DC
  Administered 2018-10-22: 2 g via INTRAVENOUS
  Filled 2018-10-22: qty 2

## 2018-10-22 MED ORDER — SENNOSIDES-DOCUSATE SODIUM 8.6-50 MG PO TABS: 1.0000 | ORAL_TABLET | Freq: Every evening | ORAL | Status: DC | PRN

## 2018-10-22 MED ORDER — TAMSULOSIN HCL 0.4 MG PO CAPS
0.4000 mg | ORAL_CAPSULE | Freq: Every day | ORAL | Status: DC
  Administered 2018-10-23 – 2018-10-31 (×9): 0.4 mg via ORAL
  Filled 2018-10-22 (×9): qty 1

## 2018-10-22 MED ORDER — TIZANIDINE HCL 4 MG PO TABS
4.0000 mg | ORAL_TABLET | Freq: Two times a day (BID) | ORAL | Status: DC
  Administered 2018-10-23 – 2018-10-31 (×17): 4 mg via ORAL
  Filled 2018-10-22 (×20): qty 1

## 2018-10-22 MED ORDER — INSULIN ASPART 100 UNIT/ML ~~LOC~~ SOLN
0.0000 [IU] | Freq: Three times a day (TID) | SUBCUTANEOUS | Status: DC
  Administered 2018-10-23: 1 [IU] via SUBCUTANEOUS
  Administered 2018-10-24: 2 [IU] via SUBCUTANEOUS

## 2018-10-22 MED ORDER — WARFARIN - PHARMACIST DOSING INPATIENT: Freq: Every day | Status: DC

## 2018-10-22 MED ORDER — SODIUM CHLORIDE 0.9 % IV BOLUS
1000.0000 mL | Freq: Once | INTRAVENOUS | Status: AC
  Administered 2018-10-22: 1000 mL via INTRAVENOUS

## 2018-10-22 MED ORDER — HYDRALAZINE HCL 20 MG/ML IJ SOLN: 5.0000 mg | INTRAMUSCULAR | Status: DC | PRN

## 2018-10-22 MED ORDER — SODIUM CHLORIDE 0.9 % IV SOLN
2.0000 g | INTRAVENOUS | Status: DC
  Administered 2018-10-23 – 2018-10-28 (×7): 2 g via INTRAVENOUS
  Filled 2018-10-22 (×8): qty 20

## 2018-10-22 MED ORDER — ONDANSETRON HCL 4 MG PO TABS: 4.0000 mg | ORAL_TABLET | Freq: Four times a day (QID) | ORAL | Status: DC | PRN

## 2018-10-22 MED ORDER — LEVOTHYROXINE SODIUM 100 MCG PO TABS
100.0000 ug | ORAL_TABLET | Freq: Every day | ORAL | Status: DC
  Administered 2018-10-23 – 2018-10-31 (×9): 100 ug via ORAL
  Filled 2018-10-22 (×9): qty 1

## 2018-10-22 MED ORDER — SODIUM CHLORIDE 0.9 % IV SOLN
INTRAVENOUS | Status: DC
  Administered 2018-10-22 – 2018-10-24 (×3): via INTRAVENOUS

## 2018-10-22 MED ORDER — MIDODRINE HCL 5 MG PO TABS
5.0000 mg | ORAL_TABLET | Freq: Three times a day (TID) | ORAL | Status: DC
  Administered 2018-10-23 – 2018-10-31 (×26): 5 mg via ORAL
  Filled 2018-10-22 (×26): qty 1

## 2018-10-22 MED ORDER — ADULT MULTIVITAMIN W/MINERALS CH
1.0000 | ORAL_TABLET | Freq: Every day | ORAL | Status: DC
  Administered 2018-10-23 – 2018-10-31 (×8): 1 via ORAL
  Filled 2018-10-22 (×8): qty 1

## 2018-10-22 MED ORDER — ACETAMINOPHEN 650 MG RE SUPP: 650.0000 mg | Freq: Four times a day (QID) | RECTAL | Status: DC | PRN

## 2018-10-22 MED ORDER — VANCOMYCIN HCL 10 G IV SOLR
2500.0000 mg | Freq: Once | INTRAVENOUS | Status: AC
  Administered 2018-10-22: 2500 mg via INTRAVENOUS
  Filled 2018-10-22: qty 2500

## 2018-10-22 MED ORDER — ACETAMINOPHEN 325 MG PO TABS
650.0000 mg | ORAL_TABLET | Freq: Four times a day (QID) | ORAL | Status: DC | PRN
  Administered 2018-10-23 – 2018-10-31 (×2): 650 mg via ORAL
  Filled 2018-10-22 (×2): qty 2

## 2018-10-22 MED ORDER — FLUOXETINE HCL 20 MG PO CAPS
20.0000 mg | ORAL_CAPSULE | Freq: Every day | ORAL | Status: DC
  Administered 2018-10-23 – 2018-10-31 (×8): 20 mg via ORAL
  Filled 2018-10-22 (×8): qty 1

## 2018-10-22 MED ORDER — ASPIRIN EC 81 MG PO TBEC
81.0000 mg | DELAYED_RELEASE_TABLET | Freq: Every day | ORAL | Status: DC
  Administered 2018-10-23 – 2018-10-31 (×8): 81 mg via ORAL
  Filled 2018-10-22 (×8): qty 1

## 2018-10-22 MED ORDER — SODIUM CHLORIDE 0.9 % IV BOLUS
1000.0000 mL | Freq: Once | INTRAVENOUS | Status: AC
  Administered 2018-10-23: 1000 mL via INTRAVENOUS

## 2018-10-22 MED ORDER — VANCOMYCIN HCL 10 G IV SOLR: 1500.0000 mg | INTRAVENOUS | Status: DC

## 2018-10-22 MED ORDER — FENOFIBRATE 160 MG PO TABS
160.0000 mg | ORAL_TABLET | Freq: Every day | ORAL | Status: DC
  Administered 2018-10-23 – 2018-10-31 (×8): 160 mg via ORAL
  Filled 2018-10-22 (×8): qty 1

## 2018-10-22 MED ORDER — GABAPENTIN 300 MG PO CAPS
300.0000 mg | ORAL_CAPSULE | Freq: Two times a day (BID) | ORAL | Status: DC
  Administered 2018-10-22 – 2018-10-31 (×17): 300 mg via ORAL
  Filled 2018-10-22 (×17): qty 1

## 2018-10-22 MED ORDER — OXYCODONE-ACETAMINOPHEN 5-325 MG PO TABS
1.0000 | ORAL_TABLET | ORAL | Status: DC | PRN
  Administered 2018-10-23 – 2018-10-26 (×6): 1 via ORAL
  Filled 2018-10-22 (×6): qty 1

## 2018-10-22 MED ORDER — ONDANSETRON HCL 4 MG/2ML IJ SOLN
4.0000 mg | Freq: Four times a day (QID) | INTRAMUSCULAR | Status: DC | PRN
  Administered 2018-10-22 – 2018-10-26 (×2): 4 mg via INTRAVENOUS
  Filled 2018-10-22: qty 2

## 2018-10-22 MED ORDER — INSULIN GLARGINE 100 UNIT/ML ~~LOC~~ SOLN
5.0000 [IU] | Freq: Every day | SUBCUTANEOUS | Status: DC
  Administered 2018-10-22 – 2018-10-23 (×2): 5 [IU] via SUBCUTANEOUS
  Filled 2018-10-22 (×4): qty 0.05

## 2018-10-22 MED ORDER — DULOXETINE HCL 60 MG PO CPEP
60.0000 mg | ORAL_CAPSULE | Freq: Every day | ORAL | Status: DC
  Administered 2018-10-23 – 2018-10-31 (×8): 60 mg via ORAL
  Filled 2018-10-22 (×8): qty 1

## 2018-10-22 MED ORDER — ATORVASTATIN CALCIUM 80 MG PO TABS
80.0000 mg | ORAL_TABLET | Freq: Every day | ORAL | Status: DC
  Administered 2018-10-23 – 2018-10-31 (×9): 80 mg via ORAL
  Filled 2018-10-22 (×9): qty 1

## 2018-10-22 MED ORDER — LACTATED RINGERS IV BOLUS
500.0000 mL | Freq: Once | INTRAVENOUS | Status: AC
  Administered 2018-10-22: 500 mL via INTRAVENOUS

## 2018-10-22 MED ORDER — PRO-STAT SUGAR FREE PO LIQD
30.0000 mL | Freq: Three times a day (TID) | ORAL | Status: DC
  Administered 2018-10-23 – 2018-10-31 (×24): 30 mL via ORAL
  Filled 2018-10-22 (×25): qty 30

## 2018-10-22 MED ORDER — ZINC SULFATE 220 (50 ZN) MG PO CAPS
220.0000 mg | ORAL_CAPSULE | Freq: Every day | ORAL | Status: DC
  Administered 2018-10-23 – 2018-10-31 (×8): 220 mg via ORAL
  Filled 2018-10-22 (×9): qty 1

## 2018-10-22 NOTE — Progress Notes (Addendum)
Pharmacy Antibiotic Note  Ethan Campbell is a 58 y.o. male admitted on 10/22/2018 with wound infection.  Pharmacy has been consulted for cefepime and vancomycin dosing. Pt had a recent right BKA on 10/04/18 here with increasing redness and drainage from surgical incision. WBC 18.9. Afebrile. SCr 2.74 (BL ~ 2.2). nCrCl ~ 30 ml/min   Plan: -Cefepime 2 gm IV Q 24 hours -Vancomycin 2500 mg IV once, then start Vancomycin 1500 mg IV Q 36 hrs. Goal AUC 400-550. Expected AUC: 491 SCr used: 2.74 -Monitor CBC, renal fx, cultures and clinical progress -Vanc trough/peak as needed    Height: 5\' 9"  (175.3 cm) Weight: (!) 309 lb (140.2 kg) IBW/kg (Calculated) : 70.7  Temp (24hrs), Avg:100.1 F (37.8 C), Min:100.1 F (37.8 C), Max:100.1 F (37.8 C)  Recent Labs  Lab 10/22/18 1652  WBC 18.9*  CREATININE 2.74*    Estimated Creatinine Clearance: 41.4 mL/min (A) (by C-G formula based on SCr of 2.74 mg/dL (H)).    No Known Allergies   Thank you for allowing pharmacy to be a part of this patient's care.  Albertina Parr, PharmD., BCPS Clinical Pharmacist Clinical phone for 10/22/18 until 10:30pm: [phone] If after 10:30pm, please refer to [facility] for unit-specific pharmacist

## 2018-10-22 NOTE — ED Notes (Signed)
X-ray at bedside

## 2018-10-22 NOTE — ED Notes (Signed)
Dressing at right BKA  Removed. Large area aroung linear staples noted as black in color. Redness noted in p[osterior portion of distal stump. Pt denies pain but red drainage noted on dressing. Not dressing replaced at MD VO.

## 2018-10-22 NOTE — ED Provider Notes (Signed)
[facility] EMERGENCY DEPARTMENT Provider Note   CSN: [id number] Arrival date & time: 10/22/18  1620    History   Chief Complaint Chief Complaint  Patient presents with   Wound Infection    HPI Ethan Campbell is a 58 y.o. male with extensive past medical history as stated below who presents emergency department from nursing home facility after concerns are raised for possible infection of recent right BKA that was performed on 10/04/2018.  Patient has been at nursing facility recovering, when facility physician raise concern over possible infection at surgical site.  He had wound VAC taken down 6 days ago where blistering was noted at the site.  He states that since that time he has had increasing redness as well as serosanguineous drainage from surgical incision. Patient has been noted to be febrile as high as 103 F at the nursing facility where he resides.  He has had contact with a COVID-19 positive healthcare worker at that facility however is not had any symptoms such as cough, congestion, shortness of breath, or chest pain.  He currently has test pending for SARS-COV 2 virus.      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 Brandon Melnick, uses bipap   Syncope    Type 2 diabetes mellitus ([place])    Venous insufficiency     Patient Active Problem List   Diagnosis Date Noted   Type II diabetes mellitus with renal manifestations ([place]) 10/22/2018   Normocytic anemia 10/22/2018   Sepsis ([place]) 10/22/2018   Wound infection after surgery 10/22/2018   DVT (deep venous thrombosis) ([place]) 10/22/2018   Hypothyroidism 10/22/2018   Close Exposure to Covid-19 Virus 10/22/2018   Below-knee amputation of right lower extremity ([place]) 10/05/2018   Gangrene of right foot ([facility])    Amputated toe, right ([place]) 09/26/2018   Subacute osteomyelitis, right ankle and foot ([place])    Osteomyelitis of great toe of right foot ([place]) 06/19/2018   Diabetic polyneuropathy associated with type 2 diabetes mellitus ([place]) 06/19/2018   Body mass index 45.0-49.9, adult ([place]) 06/19/2018   Seizure disorder ([place]) 08/27/2015   Somnolence, daytime 08/27/2015   Orthostatic hypotension 11/27/2014   HLD (hyperlipidemia) 11/27/2014   Syncope and collapse 08/20/2014   Diabetic autonomic neuropathy associated with type 2 diabetes mellitus ([place]) 05/01/2014   Dizzy spells 03/17/2014   Acute renal failure superimposed on stage 3 chronic kidney disease ([place]) 08/22/2013   Type II diabetes mellitus, uncontrolled ([place]) 12/03/2009  HYPERLIPIDEMIA 12/03/2009   Morbid obesity ([place]) 12/03/2009   OBSTRUCTIVE SLEEP APNEA 12/03/2009   Essential hypertension, benign 12/03/2009   EDEMA 12/03/2009   PRECORDIAL PAIN 12/03/2009   PROTEINURIA 12/03/2009    Past Surgical History:  Procedure Laterality Date   AMPUTATION Right 06/20/2018   Procedure: RIGHT GREAT TOE AMPUTATION;  Surgeon: Newt Minion, MD;  Location: [facility];  Service: Orthopedics;  Laterality: Right;   AMPUTATION Right 09/26/2018   Procedure: RIGHT FOOT 5TH RAY AMPUTATION;  Surgeon: Newt Minion, MD;   Location: [facility];  Service: Orthopedics;  Laterality: Right;  MAC and regional anesthesia   AMPUTATION Right 10/05/2018   Procedure: RIGHT BELOW KNEE AMPUTATION;  Surgeon: Newt Minion, MD;  Location: [facility];  Service: Orthopedics;  Laterality: Right;   COLONOSCOPY     RESECTION BONE TUMOR FEMUR  1980's   Left femur, treated at [facility] with bone graft   TOTAL KNEE ARTHROPLASTY Left 2013        Home Medications    Prior to Admission medications   Medication Sig Start Date End Date Taking? Authorizing Provider  acetaminophen (TYLENOL) 325 MG tablet Take 650 mg by mouth every 4 (four) hours as needed (fever of 101 or higher).   Yes [provider]  acetaminophen (TYLENOL) 500 MG tablet Take 1,000 mg by mouth every 6 (six) hours as needed (pain/ temp of 101 or higher).   Yes [provider]  aspirin EC 81 MG tablet Take 81 mg by mouth daily.    Yes [provider]  doxycycline (VIBRA-TABS) 100 MG tablet Take 1 tablet (100 mg total) by mouth 2 (two) times daily. 10/16/18  Yes Newt Minion, MD  Multiple Vitamin (MULTIVITAMIN WITH MINERALS) TABS tablet Take 1 tablet by mouth daily.   Yes [provider]  PRESCRIPTION MEDICATION Inhale into the lungs at bedtime. CPAP   Yes [provider]  warfarin (COUMADIN) 10 MG tablet Take 10 mg by mouth See admin instructions. Take one tablet (10 mg) by mouth every Monday, Wednesday, Friday at 5pm   Yes [provider]  warfarin (COUMADIN) 7.5 MG tablet Take 7.5 mg by mouth See admin instructions. Take one tablet (7.5 mg) by mouth every Sunday, Tuesday, Thursday, Saturday at 5pm   Yes [provider]  zinc sulfate 220 (50 Zn) MG capsule Take 220 mg by mouth daily.   Yes [provider]  atorvastatin (LIPITOR) 80 MG tablet TAKE 1 TABLET DAILY. 04/29/14   Elayne Snare, MD  DULoxetine (CYMBALTA) 60 MG capsule Take 60 mg by mouth daily.     [provider]  fenofibrate (TRICOR) 145 MG  tablet Take 145 mg by mouth daily.     [provider]  FLUoxetine (PROZAC) 20 MG capsule Take 20 mg by mouth daily.    [provider]  furosemide (LASIX) 40 MG tablet Take 40 mg by mouth 2 (two) times daily. Takes one tablet in the morning and one-half tablet in the evening     [provider]  gabapentin (NEURONTIN) 300 MG capsule Take 300 mg by mouth 2 (two) times daily.     [provider]  glucose blood (ACCU-CHEK AVIVA PLUS) test strip TEST UP TO 4 TIMES DAILY Dx code E11.65 02/28/18   Elayne Snare, MD  glucose blood (FREESTYLE LITE) test strip Check sugar 3 times daily 06/18/18   Elayne Snare, MD  HUMULIN R 500 UNIT/ML injection USE AS DIRECTED, INJECT UP TO 30 UNITS 3  TIMES A DAY. Patient taking differently: INSULIN PUMP 08/21/18   Elayne Snare, MD  Insulin Disposable Pump (OMNIPOD 5 PACK) MISC 1 Package by Does not apply route every 3 (three) days. 05/01/18   Elayne Snare, MD  levothyroxine (SYNTHROID, LEVOTHROID) 100 MCG tablet Take 1 tablet daily before breakfast Patient taking differently: Take 100 mcg by mouth daily before breakfast. Take 1 tablet daily before breakfast 06/22/18   Elayne Snare, MD  liraglutide (VICTOZA) 18 MG/3ML SOPN USE 1.8MG SUBCUTANEOUSLY DAILY. Patient taking differently: Inject 1.8 mg into the skin every morning. USE 1.8MG SUBCUTANEOUSLY DAILY. 03/29/18   Elayne Snare, MD  meclizine (ANTIVERT) 25 MG tablet Take 25 mg by mouth 3 (three) times daily as needed for dizziness.    [provider]  midodrine (PROAMATINE) 5 MG tablet Take 5 mg by mouth 3 (three) times daily with meals.     [provider]  oxyCODONE-acetaminophen (PERCOCET/ROXICET) 5-325 MG tablet Take 1 tablet by mouth every 4 (four) hours as needed. 10/10/18   Newt Minion, MD  tamsulosin (FLOMAX) 0.4 MG CAPS capsule Take 0.4 mg by mouth daily.     [provider]  tiZANidine (ZANAFLEX) 4 MG tablet Take 4 mg by mouth 2 (two) times daily.    [provider]    Family History Family History  Problem Relation Age of Onset   Heart attack Mother 77   Breast cancer Mother    Stroke Mother    Heart attack Father 63   Breast cancer Sister    Colon cancer Sister     Social History Social History   Tobacco Use   Smoking status: Former Smoker    Types: Cigarettes   Smokeless tobacco: Never Used   Tobacco comment: 11/27/14 "quit smoking years ago"  Substance Use Topics   Alcohol use: No    Alcohol/week: 0.0 standard drinks   Drug use: No     Allergies   Patient has no known allergies.   Review of Systems Review of Systems  Constitutional: Positive for fever. Negative for chills.  HENT: Negative for ear pain and sore throat.   Eyes: Negative for pain and visual disturbance.  Respiratory: Negative for cough and shortness of breath.   Cardiovascular: Negative for chest pain and palpitations.  Gastrointestinal: Negative for abdominal pain and vomiting.  Genitourinary: Negative for dysuria and hematuria.  Musculoskeletal: Negative for arthralgias and back pain.       Pain at right BKA site.   Skin: Negative for color change and rash.  Neurological: Negative for seizures and syncope.  All other systems reviewed and are negative.    Physical Exam Updated Vital Signs BP 115/74 (BP Location: Left Arm)    Pulse 92    Temp 98.4 F (36.9 C) (Oral)    Resp 18    Ht _0  (1.753 m)    Wt (!) 140.2 kg    SpO2 97%    BMI 45.63 kg/m   Physical Exam Vitals signs and nursing note reviewed.  Constitutional:      Appearance: He is well-developed.  HENT:     Head: Normocephalic and atraumatic.  Eyes:     Conjunctiva/sclera: Conjunctivae normal.  Neck:     Musculoskeletal: Neck supple.  Cardiovascular:     Rate and Rhythm: Normal rate and regular rhythm.     Heart sounds: No murmur.  Pulmonary:     Effort: Pulmonary effort is normal. No respiratory distress.     Breath sounds: Normal  breath sounds.    Abdominal:     Palpations: Abdomen is soft.     Tenderness: There is no abdominal tenderness.  Musculoskeletal:     Comments: Intact staple line at site of right BKA with some serosanguineous drainage. Mild fluctuance to the area distal to the surgical site. Drainage is malodorous. Erythema of the entire distal portion of the RLE that does not extend past the mid thigh. Large necrotic  Eschar noted tat the inferomedial portion of surgical site.   Skin:    General: Skin is warm and dry.  Neurological:     General: No focal deficit present.     Mental Status: He is alert and oriented to person, place, and time.     Cranial Nerves: No cranial nerve deficit.      ED Treatments / Results  Labs (all labs ordered are listed, but only abnormal results are displayed) Labs Reviewed  CBC WITH DIFFERENTIAL/PLATELET - Abnormal; Notable for the following components:      Result Value   WBC 18.9 (*)    RBC 3.46 (*)    Hemoglobin 8.9 (*)    HCT 30.2 (*)    MCH 25.7 (*)    MCHC 29.5 (*)    Platelets 680 (*)    Neutro Abs 14.9 (*)    Monocytes Absolute 1.8 (*)    Abs Immature Granulocytes 0.11 (*)    All other components within normal limits  COMPREHENSIVE METABOLIC PANEL - Abnormal; Notable for the following components:   Glucose, Bld 159 (*)    BUN 81 (*)    Creatinine, Ser 2.74 (*)    Albumin 2.4 (*)    GFR calc non Af Amer 25 (*)    GFR calc Af Amer 29 (*)    All other components within normal limits  SEDIMENTATION RATE - Abnormal; Notable for the following components:   Sed Rate 130 (*)    All other components within normal limits  C-REACTIVE PROTEIN - Abnormal; Notable for the following components:   CRP 15.1 (*)    All other components within normal limits  PROTIME-INR - Abnormal; Notable for the following components:   Prothrombin Time 30.4 (*)    INR 3.0 (*)    All other components within normal limits  APTT - Abnormal; Notable for the following components:   aPTT 67 (*)     All other components within normal limits  CULTURE, BLOOD (ROUTINE X 2)  CULTURE, BLOOD (ROUTINE X 2)  LACTIC ACID, PLASMA  PROCALCITONIN  BRAIN NATRIURETIC PEPTIDE  GLUCOSE, CAPILLARY  LACTIC ACID, PLASMA  HIV ANTIBODY (ROUTINE TESTING W REFLEX)  BASIC METABOLIC PANEL  CBC  PROTIME-INR    EKG None  Radiology Dg Femur Portable Min 2 Views Right  Result Date: 10/22/2018 CLINICAL DATA:  Right leg pain EXAM: RIGHT FEMUR PORTABLE 2 VIEW COMPARISON:  None. FINDINGS: There is no evidence of fracture or other focal bone lesions. Soft tissues are unremarkable. Right BKA with skin staples. Expected postoperative appearance. IMPRESSION: Expected postoperative appearance of right BKA.  No acute findings. Electronically Signed   By: Ulyses Jarred M.D.   On: 10/22/2018 19:15    Procedures Procedures (including critical care time)  Medications Ordered in ED Medications  ceFEPIme (MAXIPIME) 2 g in sodium chloride 0.9 % 100 mL IVPB (0 g Intravenous Stopped 10/22/18 2031)  vancomycin (VANCOCIN) 2,500 mg in sodium chloride 0.9 % 500 mL IVPB (2,500 mg Intravenous Transfusing/Transfer 10/22/18 2133)  vancomycin (VANCOCIN) 1,500 mg in  sodium chloride 0.9 % 500 mL IVPB (has no administration in time range)  insulin aspart (novoLOG) injection 0-9 Units (has no administration in time range)  0.9 %  sodium chloride infusion ( Intravenous Transfusing/Transfer 10/22/18 2133)  acetaminophen (TYLENOL) tablet 650 mg (has no administration in time range)    Or  acetaminophen (TYLENOL) suppository 650 mg (has no administration in time range)  senna-docusate (Senokot-S) tablet 1 tablet (has no administration in time range)  ondansetron (ZOFRAN) tablet 4 mg (has no administration in time range)    Or  ondansetron (ZOFRAN) injection 4 mg (has no administration in time range)  sodium chloride 0.9 % bolus 1,000 mL (has no administration in time range)  hydrALAZINE (APRESOLINE) injection 5 mg (has no  administration in time range)  Warfarin - Pharmacist Dosing Inpatient (has no administration in time range)  lactated ringers bolus 500 mL (0 mLs Intravenous Stopped 10/22/18 1807)     Initial Impression / Assessment and Plan / ED Course  I have reviewed the triage vital signs and the nursing notes.  Pertinent labs & imaging results that were available during my care of the patient were reviewed by me and considered in my medical decision making (see chart for details).        Patient is a 58 year old male with extensive past medical history stated above who presented to the emergency department for concerns of postoperative wound infection of right BKA which is performed few weeks ago.  Patient states after his wound VAC was taken down 1 week ago he has had increasing redness and drainage from his surgical incision site.  He was also found to be febrile at his nursing facility where 1 of the healthcare workers has tested positive for COVID-19.  Patient is currently awaiting the results of his test however has no symptoms of cough, shortness of breath, or chest pain.  Not initial evaluation of the patient he was hemodynamically stable and nontoxic-appearing.  Patient's temperature was 100.1 degrees Fahrenheit.  He was not tachycardic, normotensive, satting appropriately on room air.  Physical examination as detailed above which was remarkable for overlying erythema at surgical incision site with large necrotic eschar at the antero-medial aspect of the surgical incision.  Some fluctuance is present however no subcutaneous emphysema or crepitus is present.  Cellulitic changes which extend proximally but do not cross the mid thigh.  Clinical concerns at this time are for postoperative infection.  Patient was given small 500cc IV fluid bolus given his reported history of CHF while awaiting lab work.  Lab work markable for leukocytosis of 18.9 with markedly elevated inflammatory markers including  CRP of 15.1 and ESR of 130.  Metabolic panel consistent with known CKD status.  Elevated glucose of 159 however bicarb and anion gap are within normal limits.  No significant electrolyte derangements.  X-ray of the right lower extremity showing expected postoperative appearance of the right BKA.  No acute findings such as subcutaneous emphysema or osteomyelitis present.  Cultures were obtained and patient was started on broad-spectrum antibiotics including IV vancomycin and cefepime.  Case discussed with on-call orthopedic surgeon Dr. Louanne Skye.  No additional recommendations given at this time.  He will inform Dr. Sharol Given, with likely operative intervention tomorrow.  Patient does not appear to be septic, does not require aggressive fluid resuscitation at this time.  Patient's case was discussed with Dr. Blaine Hamper who is in agreement at this time for admission and ongoing evaluation and care of wound infection.  Patient was  in stable condition while the emergency department and at time of admission.  Final Clinical Impressions(s) / ED Diagnoses   Final diagnoses:  Wound infection    ED Discharge Orders    None       Tommie Raymond, MD 10/22/18 2225    Duffy Bruce, MD 10/23/18 [phone]

## 2018-10-22 NOTE — ED Notes (Addendum)
ED TO INPATIENT HANDOFF REPORT  ED Nurse Name and Phone #: Lantz Hermann [phone]  S Name/Age/Gender Ethan Campbell 58 y.o. male Room/Bed: 021C/021C  Code Status   Code Status: Full Code  Home/SNF/Other Nursing Home Patient oriented to: self, place, time and situation Is this baseline? 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 Pt also advised he may have been exposed to Covid 19 by nurse at his facility and has been tested with lab pending.    Allergies No Known Allergies  Level of Care/Admitting Diagnosis ED Disposition    ED Disposition Condition [facility]: [place] [100100]  Level of Care: Med-Surg [16]  Diagnosis: Wound infection after surgery [063016]  Admitting Physician: Ivor Costa [[id number]]  Attending Physician: Ivor Costa [phone]  Estimated length of stay: past midnight tomorrow  Certification:: I certify this patient will need inpatient services for at least 2 midnights  Bed request comments: COVID 19 rule out, low risk  PT Class (Do Not Modify): Inpatient [101]  PT Acc Code (Do Not Modify): Private [1]       B Medical/Surgery History Past Medical History:  Diagnosis Date  . Arm vein blood clot, left    on Coumadin  . Arthritis   . 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 Chronic kidney disease    stage 3  . Depression   . Essential hypertension   . History of stroke   . Hyperlipidemia   . Hypothyroidism   . Insomnia   . Obesity   . Precordial pain June 2011   Nuclear stress; no ischemia; EF 60%  . Presence of permanent cardiac pacemaker   . Proteinuria   . Seizure disorder ([place])   . Sick sinus syndrome ([place])   . Sleep apnea    Dr. Brandon Melnick, uses bipap  . Syncope   . Type 2 diabetes mellitus ([place])   . Venous insufficiency    Past Surgical History:   Procedure Laterality Date  . AMPUTATION Right 06/20/2018   Procedure: RIGHT GREAT TOE AMPUTATION;  Surgeon: Newt Minion, MD;  Location: [place];  Service: Orthopedics;  Laterality: Right;  . AMPUTATION Right 09/26/2018   Procedure: RIGHT FOOT 5TH RAY AMPUTATION;  Surgeon: Newt Minion, MD;  Location: [place];  Service: Orthopedics;  Laterality: Right;  MAC and regional anesthesia  . AMPUTATION Right 10/05/2018   Procedure: RIGHT BELOW KNEE AMPUTATION;  Surgeon: Newt Minion, MD;  Location: [place];  Service: Orthopedics;  Laterality: Right;  . COLONOSCOPY    . RESECTION BONE TUMOR FEMUR  1980's   Left femur, treated at [facility] with bone graft  . TOTAL KNEE ARTHROPLASTY Left 2013     A IV Location/Drains/Wounds Patient Lines/Drains/Airways Status   Active Line/Drains/Airways    Name:   Placement date:   Placement time:   Site:   Days:   Peripheral IV 10/22/18 Right Antecubital   10/22/18    1644    Antecubital   less than 1   Negative Pressure Wound Therapy Leg Right;Distal   10/05/18    1210    -   17   Incision (Closed) 10/05/18 Leg Right   10/05/18    1212     17          Intake/Output  Last 24 hours No intake or output data in the 24 hours ending 10/22/18 2101  Labs/Imaging Results for orders placed or performed during the hospital encounter of 10/22/18 (from the past 48 hour(s))  CBC with Differential     Status: Abnormal   Collection Time: 10/22/18  4:52 PM  Result Value Ref Range   WBC 18.9 (H) 4.0 - 10.5 K/uL   RBC 3.46 (L) 4.22 - 5.81 MIL/uL   Hemoglobin 8.9 (L) 13.0 - 17.0 g/dL   HCT 30.2 (L) 39.0 - 52.0 %   MCV 87.3 80.0 - 100.0 fL   MCH 25.7 (L) 26.0 - 34.0 pg   MCHC 29.5 (L) 30.0 - 36.0 g/dL   RDW 15.3 11.5 - 15.5 %   Platelets 680 (H) 150 - 400 K/uL   nRBC 0.0 0.0 - 0.2 %   Neutrophils Relative % 79 %   Neutro Abs 14.9 (H) 1.7 - 7.7 K/uL   Lymphocytes Relative 11 %   Lymphs Abs 2.1 0.7 - 4.0 K/uL   Monocytes Relative 9 %   Monocytes Absolute 1.8 (H) 0.1 - 1.0  K/uL   Eosinophils Relative 0 %   Eosinophils Absolute 0.1 0.0 - 0.5 K/uL   Basophils Relative 0 %   Basophils Absolute 0.1 0.0 - 0.1 K/uL   Immature Granulocytes 1 %   Abs Immature Granulocytes 0.11 (H) 0.00 - 0.07 K/uL    Comment: Performed at [facility], [address], [place], [place] [address]  Comprehensive metabolic panel     Status: Abnormal   Collection Time: 10/22/18  4:52 PM  Result Value Ref Range   Sodium 136 135 - 145 mmol/L   Potassium 4.7 3.5 - 5.1 mmol/L    Comment: HEMOLYSIS AT THIS LEVEL MAY AFFECT RESULT   Chloride 98 98 - 111 mmol/L   CO2 28 22 - 32 mmol/L   Glucose, Bld 159 (H) 70 - 99 mg/dL   BUN 81 (H) 6 - 20 mg/dL   Creatinine, Ser 2.74 (H) 0.61 - 1.24 mg/dL   Calcium 9.1 8.9 - 10.3 mg/dL   Total Protein 7.8 6.5 - 8.1 g/dL   Albumin 2.4 (L) 3.5 - 5.0 g/dL   AST 41 15 - 41 U/L   ALT 29 0 - 44 U/L   Alkaline Phosphatase 108 38 - 126 U/L   Total Bilirubin 0.7 0.3 - 1.2 mg/dL   GFR calc non Af Amer 25 (L) >60 mL/min   GFR calc Af Amer 29 (L) >60 mL/min   Anion gap 10 5 - 15    Comment: Performed at [facility], [place] [address], [place], [place] [address]  Sedimentation rate     Status: Abnormal   Collection Time: 10/22/18  4:52 PM  Result Value Ref Range   Sed Rate 130 (H) 0 - 16 mm/hr    Comment: Performed at [facility] [address] [place], [place] [address]  C-reactive protein     Status: Abnormal   Collection Time: 10/22/18  5:13 PM  Result Value Ref Range   CRP 15.1 (H) <1.0 mg/dL    Comment: Performed at [facility] [address] [place], [place] [address]   Dg Femur Portable Min 2 Views Right  Result Date: 10/22/2018 CLINICAL DATA:  Right leg pain EXAM: RIGHT FEMUR PORTABLE 2 VIEW COMPARISON:  None. FINDINGS: There is no evidence of fracture or other focal bone lesions. Soft tissues are unremarkable. Right BKA with  skin staples. Expected postoperative appearance. IMPRESSION: Expected postoperative appearance  of right BKA.  No acute findings. Electronically Signed   By: Ulyses Jarred M.D.   On: 10/22/2018 19:15    Pending Labs Unresulted Labs (From admission, onward)    Start     Ordered   10/23/18 7353  Basic metabolic panel  Tomorrow morning,   R     10/22/18 2050   10/23/18 0500  CBC  Tomorrow morning,   R     10/22/18 2050   10/22/18 2048  Brain natriuretic peptide  Once,   R     10/22/18 2047   10/22/18 2048  HIV antibody (Routine Testing)  Once,   R     10/22/18 2050   10/22/18 2047  Lactic acid, plasma  STAT Now then every 3 hours,   STAT     10/22/18 2047   10/22/18 2047  Procalcitonin  ONCE - STAT,   R     10/22/18 2047   10/22/18 2047  Protime-INR  Once,   R     10/22/18 2047   10/22/18 2047  APTT  Once,   R     10/22/18 2047   10/22/18 1644  Blood culture (routine x 2)  BLOOD CULTURE X 2,   STAT     10/22/18 1644          Vitals/Pain Today's Vitals   10/22/18 1930 10/22/18 1938 10/22/18 1939 10/22/18 2000  BP: 127/65   130/62  Pulse: 90   92  Resp: 19   11  Temp:   98.8 F (37.1 C)   TempSrc:   Oral   SpO2: 96%   97%  Weight:      Height:      PainSc:  3       Isolation Precautions No active isolations  Medications Medications  ceFEPIme (MAXIPIME) 2 g in sodium chloride 0.9 % 100 mL IVPB (0 g Intravenous Stopped 10/22/18 2031)  vancomycin (VANCOCIN) 2,500 mg in sodium chloride 0.9 % 500 mL IVPB (2,500 mg Intravenous New Bag/Given 10/22/18 2100)  vancomycin (VANCOCIN) 1,500 mg in sodium chloride 0.9 % 500 mL IVPB (has no administration in time range)  insulin aspart (novoLOG) injection 0-9 Units (has no administration in time range)  0.9 %  sodium chloride infusion ( Intravenous New Bag/Given 10/22/18 2059)  acetaminophen (TYLENOL) tablet 650 mg (has no administration in time range)    Or  acetaminophen (TYLENOL) suppository 650 mg (has no administration in time range)  senna-docusate (Senokot-S) tablet 1 tablet (has no administration in time range)   ondansetron (ZOFRAN) tablet 4 mg (has no administration in time range)    Or  ondansetron (ZOFRAN) injection 4 mg (has no administration in time range)  lactated ringers bolus 500 mL (0 mLs Intravenous Stopped 10/22/18 1807)    Mobility manual wheelchair High fall risk   Focused Assessments Musculoskeletal   R Recommendations: See Admitting Provider Note  Report given to:   Additional Notes: Pt lives in nursing home with positive COVID-19 residents so pt was tested at facility. Denies any cough or SOB. Mild fever of 100.1 orally when pt arrived today, temp now 98.8 orally. Staples around stump intact but bleeding noted around incision.

## 2018-10-22 NOTE — H&P (Signed)
History and Physical    MATTOX SCHORR JQZ:[id number] DOB: 07-Oct-1960 DOA: 10/22/2018  Referring MD/NP/PA:   PCP: Arsenio Katz, NP   Patient coming from:  The patient is coming from [facility].  At baseline, pt is currently dependent for most of ADL.        Chief Complaint: right leg wound problem after BKA sugery  HPI: Ethan Campbell is a 58 y.o. male with medical history significant of hypertension, hyperlipidemia, diabetes mellitus, hypothyroidism, depression, left arm DVT on Coumadin, bone cancer (large cell tumor in May 1987), obesity, OSA on CPAP, CKD-3, who presents with right leg wound problem after BKA sugery.  Pt underwent right BKA procedure which was performed on 10/04/2018.  He had wound VAC taken down 6 days ago. Patient has been at nursing facility recovering. Pt states that he developed fever of 103F and chills today. He also has increasing redness as well as serosanguineous drainage from surgical incision. He states that he only has mild pain in wound area. Facility physician is concerned for possible infection at surgical site.  Patient does not have chest pain, shortness of breath, cough.  No nausea, vomiting, diarrhea or abdominal pain.  Denies symptoms of UTI. Pt states that he has been taking doxycycline after surgery.  He states that he has had contact with a COVID-19 positive healthcare worker at that facility, but he is asymptomatic for COVID-19. He currently has test pending for COVID19. Pt states that he is taking Midodrine for possible orthostatic hypotension.  ED Course: pt was found to have WBC 18.9, lactic acid 1.1, CRP 15.1, ESR 130, BNP 66.1, worsening renal function, temperature 100.1, no tachycardia, has RR 22, oxygen saturation 95% on room air. Pt is admitted to med-surg bed as inpt. Dr. Derald Macleod of ED will consult ortho.    X-ray of right femur showed and expected postoperative appearance of right BKA, but no acute findings.  Review of Systems:   General:  has fevers, chills, no body weight gain, has fatigue HEENT: no blurry vision, hearing changes or sore throat Respiratory: no dyspnea, coughing, wheezing CV: no chest pain, no palpitations GI: no nausea, vomiting, abdominal pain, diarrhea, constipation GU: no dysuria, burning on urination, increased urinary frequency, hematuria  Ext: no leg edema Neuro: no unilateral weakness, numbness, or tingling, no vision change or hearing loss Skin: no rash. s/p of right BKA with draining from wound. MSK: No muscle spasm, no deformity, no limitation of range of movement in spin Heme: No easy bruising.  Travel history: No recent long distant travel.  Allergy: No Known Allergies  Past Medical History:  Diagnosis Date  . Arm vein blood clot, left    on Coumadin  . Arthritis   . Cancer ([place])    Bone cancer 1987 (in knee Large cell tumor)  . Chronic kidney disease    stage 3  . Depression   . Essential hypertension   . History of stroke   . Hyperlipidemia   . Hypothyroidism   . Insomnia   . Obesity   . Precordial pain June 2011   Nuclear stress; no ischemia; EF 60%  . Presence of permanent cardiac pacemaker   . Proteinuria   . Seizure disorder ([place])   . Sick sinus syndrome ([place])   . Sleep apnea    Dr. Brandon Melnick, uses bipap  . Syncope   . Type 2 diabetes mellitus ([place])   . Venous insufficiency     Past Surgical History:  Procedure Laterality Date  .  AMPUTATION Right 06/20/2018   Procedure: RIGHT GREAT TOE AMPUTATION;  Surgeon: Newt Minion, MD;  Location: [facility];  Service: Orthopedics;  Laterality: Right;  . AMPUTATION Right 09/26/2018   Procedure: RIGHT FOOT 5TH RAY AMPUTATION;  Surgeon: Newt Minion, MD;  Location: [facility];  Service: Orthopedics;  Laterality: Right;  MAC and regional anesthesia  . AMPUTATION Right 10/05/2018   Procedure: RIGHT BELOW KNEE AMPUTATION;  Surgeon: Newt Minion, MD;  Location: [facility];  Service: Orthopedics;  Laterality: Right;  . COLONOSCOPY    . RESECTION  BONE TUMOR FEMUR  1980's   Left femur, treated at [facility] with bone graft  . TOTAL KNEE ARTHROPLASTY Left 2013    Social History:  reports that he has quit smoking. His smoking use included cigarettes. He has never used smokeless tobacco. He reports that he does not drink alcohol or use drugs.  Family History:  Family History  Problem Relation Age of Onset  . Heart attack Mother 19  . Breast cancer Mother   . Stroke Mother   . Heart attack Father 75  . Breast cancer Sister   . Colon cancer Sister      Prior to Admission medications   Medication Sig Start Date End Date Taking? Authorizing Provider  aspirin EC 81 MG tablet Take 81 mg by mouth daily.     [provider]  atorvastatin (LIPITOR) 80 MG tablet TAKE 1 TABLET DAILY. Patient taking differently: Take 80 mg by mouth daily.  04/29/14   Elayne Snare, MD  benazepril (LOTENSIN) 20 MG tablet Take 20 mg by mouth daily.    [provider]  doxycycline (VIBRA-TABS) 100 MG tablet Take 1 tablet (100 mg total) by mouth 2 (two) times daily. 10/16/18   Newt Minion, MD  DULoxetine (CYMBALTA) 60 MG capsule Take 60 mg by mouth daily.     [provider]  fenofibrate (TRICOR) 145 MG tablet Take 145 mg by mouth daily.     [provider]  FLUoxetine (PROZAC) 20 MG capsule Take 20 mg by mouth daily.    [provider]  furosemide (LASIX) 80 MG tablet Take 40-80 mg by mouth See admin instructions. Takes one tablet in the morning and one-half tablet in the evening     [provider]  gabapentin (NEURONTIN) 300 MG capsule Take 300 mg by mouth 2 (two) times daily.     [provider]  glucose blood (ACCU-CHEK AVIVA PLUS) test strip TEST UP TO 4 TIMES DAILY Dx code E11.65 02/28/18   Elayne Snare, MD  glucose blood (FREESTYLE LITE) test strip Check sugar 3 times daily 06/18/18   Elayne Snare, MD  HUMULIN R 500 UNIT/ML injection USE AS DIRECTED, INJECT UP TO 30 UNITS 3 TIMES A DAY. Patient taking  differently: INSULIN PUMP 08/21/18   Elayne Snare, MD  Insulin Disposable Pump (OMNIPOD 5 PACK) MISC 1 Package by Does not apply route every 3 (three) days. 05/01/18   Elayne Snare, MD  levothyroxine (SYNTHROID, LEVOTHROID) 100 MCG tablet Take 1 tablet daily before breakfast Patient taking differently: Take 100 mcg by mouth daily before breakfast. Take 1 tablet daily before breakfast 06/22/18   Elayne Snare, MD  liraglutide (VICTOZA) 18 MG/3ML SOPN USE 1.8MG SUBCUTANEOUSLY DAILY. Patient taking differently: Inject 1.8 mg into the skin every morning. USE 1.8MG SUBCUTANEOUSLY DAILY. 03/29/18   Elayne Snare, MD  meclizine (ANTIVERT) 25 MG tablet Take 25 mg by mouth 3 (three) times daily as needed for dizziness.  [provider]  midodrine (PROAMATINE) 5 MG tablet Take 5 mg by mouth 3 (three) times daily with meals.     [provider]  oxyCODONE-acetaminophen (PERCOCET/ROXICET) 5-325 MG tablet Take 1 tablet by mouth every 4 (four) hours as needed. 10/10/18   Newt Minion, MD  tamsulosin (FLOMAX) 0.4 MG CAPS capsule Take 0.4 mg by mouth daily.     [provider]  tiZANidine (ZANAFLEX) 4 MG tablet Take 4 mg by mouth 2 (two) times daily.    [provider]  warfarin (COUMADIN) 10 MG tablet Take 10 mg by mouth See admin instructions. Mon and Fri    [provider]  warfarin (COUMADIN) 7.5 MG tablet Take 7.5 mg by mouth See admin instructions. Su Tu W Th Sa    [provider]    Physical Exam: Vitals:   10/22/18 1939 10/22/18 2000 10/22/18 2100 10/22/18 2213  BP:  130/62 105/63 115/74  Pulse:  92 89 92  Resp:  _0 Temp: 98.8 F (37.1 C)   98.4 F (36.9 C)  TempSrc: Oral   Oral  SpO2:  97% 96% 97%  Weight:      Height:       General: Not in acute distress HEENT:       Eyes: PERRL, EOMI, no scleral icterus.       ENT: No discharge from the ears and nose, no pharynx injection, no tonsillar enlargement.        Neck: No JVD, no bruit, no  mass felt. Heme: No neck lymph node enlargement. Cardiac: S1/S2, RRR, No murmurs, No gallops or rubs. Respiratory: No rales, wheezing, rhonchi or rubs. GI: Soft, nondistended, nontender, no rebound pain, no organomegaly, BS present. GU: No hematuria Ext: No pitting leg edema bilaterally. 2+DP/PT pulse bilaterally. Musculoskeletal: No joint deformities, No joint redness or warmth, no limitation of ROM in spin. Skin: s/p of right BKA, with surrounding erythema and some serosanguineous drainage. Mild fluctuance to the area distal to the surgical site. Has a large necrotic  eschar at inferomedial portion of surgical site.    Neuro: Alert, oriented X3, cranial nerves II-XII grossly intact, moves all extremities normally.  Psych: Patient is not psychotic, no suicidal or hemocidal ideation.  Labs on Admission: I have personally reviewed following labs and imaging studies  CBC: Recent Labs  Lab 10/22/18 1652  WBC 18.9*  NEUTROABS 14.9*  HGB 8.9*  HCT 30.2*  MCV 87.3  PLT 144*   Basic Metabolic Panel: Recent Labs  Lab 10/22/18 1652  NA 136  K 4.7  CL 98  CO2 28  GLUCOSE 159*  BUN 81*  CREATININE 2.74*  CALCIUM 9.1   GFR: Estimated Creatinine Clearance: 41.4 mL/min (A) (by C-G formula based on SCr of 2.74 mg/dL (H)). Liver Function Tests: Recent Labs  Lab 10/22/18 1652  AST 41  ALT 29  ALKPHOS 108  BILITOT 0.7  PROT 7.8  ALBUMIN 2.4*   No results for input(s): LIPASE, AMYLASE in the last 168 hours. No results for input(s): AMMONIA in the last 168 hours. Coagulation Profile: Recent Labs  Lab 10/22/18 2057  INR 3.0*   Cardiac Enzymes: No results for input(s): CKTOTAL, CKMB, CKMBINDEX, TROPONINI in the last 168 hours. BNP (last 3 results) No results for input(s): PROBNP in the last 8760 hours. HbA1C: No results for input(s): HGBA1C in the last 72 hours. CBG: Recent Labs  Lab 10/22/18 2208  GLUCAP 93   Lipid Profile: No results for  input(s): CHOL, HDL,  LDLCALC, TRIG, CHOLHDL, LDLDIRECT in the last 72 hours. Thyroid Function Tests: No results for input(s): TSH, T4TOTAL, FREET4, T3FREE, THYROIDAB in the last 72 hours. Anemia Panel: No results for input(s): VITAMINB12, FOLATE, FERRITIN, TIBC, IRON, RETICCTPCT in the last 72 hours. Urine analysis:    Component Value Date/Time   COLORURINE YELLOW 09/03/2015 1400   APPEARANCEUR CLEAR 09/03/2015 1400   LABSPEC 1.020 09/03/2015 1400   PHURINE 5.0 09/03/2015 1400   GLUCOSEU 500 (A) 09/03/2015 1400   GLUCOSEU 500 (A) 11/14/2013 1016   HGBUR NEGATIVE 09/03/2015 1400   BILIRUBINUR NEGATIVE 09/03/2015 1400   KETONESUR NEGATIVE 09/03/2015 1400   PROTEINUR NEGATIVE 09/03/2015 1400   UROBILINOGEN 0.2 03/24/2015 1530   NITRITE NEGATIVE 09/03/2015 1400   LEUKOCYTESUR NEGATIVE 09/03/2015 1400   Sepsis Labs: _0 (procalcitonin:4,lacticidven:4) )No results found for this or any previous visit (from the past 240 hour(s)).   Radiological Exams on Admission: Dg Femur Portable Min 2 Views Right  Result Date: 10/22/2018 CLINICAL DATA:  Right leg pain EXAM: RIGHT FEMUR PORTABLE 2 VIEW COMPARISON:  None. FINDINGS: There is no evidence of fracture or other focal bone lesions. Soft tissues are unremarkable. Right BKA with skin staples. Expected postoperative appearance. IMPRESSION: Expected postoperative appearance of right BKA.  No acute findings. Electronically Signed   By: Ulyses Jarred M.D.   On: 10/22/2018 19:15     EKG:  Not done in ED.  Assessment/Plan Principal Problem:   Wound infection after surgery Active Problems:   Essential hypertension, benign   Acute renal failure superimposed on stage 3 chronic kidney disease (HCC)   HLD (hyperlipidemia)   Below-knee amputation of right lower extremity (HCC)   Type II diabetes mellitus with renal manifestations (HCC)   Normocytic anemia   Sepsis (HCC)   DVT (deep venous thrombosis) ([place])   Hypothyroidism   Close Exposure to Covid-19 Virus   Sepsis due to wound infection after surgery in right leg (s/p of BKA): pt meets criteria for sepsis with leukocytosis, fever and tachypnea.  Lactic acid is normal.  Currently hemodynamically stable.  Patient failed outpatient doxycycline treatment. Per EDP's note, "case discussed with on-call orthopedic surgeon Dr. Louanne Skye.  No additional recommendations given at this time.  He will inform Dr. Sharol Given, with likely operative intervention tomorrow".  - will admit to Med-surg as inpt - Empiric antimicrobial treatment with vancomycin  And Rocephin ( pt received 1 dose of cefepime in ED) - PRN Zofran for nausea,  prn Percocet for pain - Blood cultures x 2  - will get Procalcitonin and trend lactic acid levels per sepsis protocol. - IVF: 500 ml ringer solution in ED, then 2.0 L of NS bolus in ED, followed by 100 cc/h - f/u ortho's recommendations  Essential hypertension, benign: -IV hydralazine PRN -Hold Lotensin, Lasix due to worsening of renal function and risk of developing hypotension secondary to sepsis  AoCKD-III: Baseline Cre is 2.0-2.3, pt's Cre is 2.74 and BUN 81 on admission. Likely due to dehydration and continuation of ARB, diuretics - IVF as above - Follow up renal function by BMP - Hold Lasix and Lotensin  HLD (hyperlipidemia):  -tricor and Lipitor  Type II diabetes mellitus with renal manifestations ([place]): Last A1c 6.9 on 06/15/18, well controled. Patient is using insulin pump. Pharmacist tried to do medication reconciliation, but "no record in any of the paperwork sent to [place]. She could not find anything that said what type insulin is used in the pump". His blood sugar is 159 -  start Lantus 5 Units daily now-->need make adjustment depending on the blood sugar level -SSI  Normocytic anemia: hgb 7.8 on 10/08/18-->8.9 -will f/u by CBC -get type and screen in case pt needs blood transfusion if patient needs surgery.  DVT (deep venous thrombosis) ([place]): -switch Coumadin to IV heparin in  case patient needs surgery  Hypothyroidism:  -Continue Synthroid   Close Exposure to Covid-19 Virus: He states that he has had contact with a COVID-19 positive healthcare worker at that facility, but he is asymptomatic for COVID-19. He currently has test pending for COVID19. Pt is low risk now.   Physician PPE: I used Capr, gown, glove Patient PPE: No coughing. Pt was on mask when I entered room Fever: yes (likely from R leg wound infection) Cough: no  SOB: no URI symptoms: no GI symptoms: no Travel: no Sick contacts: yes CBC: no leukopenia, lymphopenia  BMP: increased BUN/Cr (81/2.74) LFTs: no increased AST/ALT/Tbili  CRP, LDH: CPR 15.1; LDH not done Procalcitonin: 0.18 CXR: not done (no respiratory symptoms CT chest: not done COVID subjective risk assessment: low risk COVID Testing: pending in SNF Precaution: Droplet and contact   Inpatient status:  # Patient requires inpatient status due to high intensity of service, high risk for further deterioration and high frequency of surveillance required.  I certify that at the point of admission it is my clinical judgment that the patient will require inpatient hospital care spanning beyond 2 midnights from the point of admission.  . This patient has multiple chronic comorbidities including hypertension, hyperlipidemia, diabetes mellitus, hypothyroidism, depression, left arm DVT on Coumadin, bone cancer (large cell tumor in May 1987), obesity, OSA on CPAP, CKD-3, recent right BKA surgery. . Now patient has presenting with sepsis due to surgical wound infection after recent R BKA surgery.  . The worrisome physical exam findings include: s/p of right BKA, with surrounding erythema and some serosanguineous drainage. Mild fluctuance to the area distal to the surgical site. Has a large necrotic eschar at inferomedial portion of surgical site. . The initial radiographic and laboratory data are worrisome because of leukocytosis, sepsis,  elevated CRP, elevated ESR . Current medical needs: please see my assessment and plan . Predictability of an adverse outcome (risk): Patient has multiple comorbidities, now presents with sepsis secondary to surgical wound infection after recent R BKA surgery.  Patient failed outpatient antibiotic treatment.  Will likely need surgical intervention.  Patient is at high risk of deteriorating.  Will need to be treated in hospital for at least 2 days.     DVT ppx: on coumadin Code Status: Full code Family Communication: None at bed side.        Disposition Plan:  Anticipate discharge back to previous SNF Consults called:  Dr. Damita Dunnings of ED will consult Ortho Admission status:  medical floor/obs         Date of Service 10/22/2018    [facility]   If 7PM-7AM, please contact night-coverage [email or web address] Password [email or web address] 10/22/2018, 10:50 PM

## 2018-10-22 NOTE — Progress Notes (Signed)
ANTICOAGULATION CONSULT NOTE - Initial Consult  Pharmacy Consult for warfarin Indication: H/o DVT  No Known Allergies  Patient Measurements: Height: 5\' 9"  (175.3 cm) Weight: (!) 309 lb (140.2 kg) IBW/kg (Calculated) : 70.7  Vital Signs: Temp: 98.8 F (37.1 C) (03/30 1939) Temp Source: Oral (03/30 1939) BP: 130/62 (03/30 2000) Pulse Rate: 92 (03/30 2000)  Labs: Recent Labs    10/22/18 1652  HGB 8.9*  HCT 30.2*  PLT 680*  CREATININE 2.74*    Estimated Creatinine Clearance: 41.4 mL/min (A) (by C-G formula based on SCr of 2.74 mg/dL (H)).   Medical History: Past Medical History:  Diagnosis Date  . Arm vein blood clot, left    on Coumadin  . Arthritis   . Cancer ([place])    Bone cancer 1987 (in knee Large cell tumor)  . Chronic kidney disease    stage 3  . Depression   . Essential hypertension   . History of stroke   . Hyperlipidemia   . Hypothyroidism   . Insomnia   . Obesity   . Precordial pain June 2011   Nuclear stress; no ischemia; EF 60%  . Presence of permanent cardiac pacemaker   . Proteinuria   . Seizure disorder ([place])   . Sick sinus syndrome ([place])   . Sleep apnea    Dr. Brandon Melnick, uses bipap  . Syncope   . Type 2 diabetes mellitus ([place])   . Venous insufficiency     Medications:  (Not in a hospital admission)   Assessment: 20 YOM here with wound infection on warfarin at home for h/o of LUE DVT. INR 3 on admission. H/H low, Plt 680k. Per patient he took warfarin dose today but was unable to recall regimen. Per his outpatient notes, his warfarin dose has fluctuated between 7.5 mg daily up to 10 mg daily.   Goal of Therapy:  INR 2-3 Monitor platelets by anticoagulation protocol: Yes   Plan:  -Hold warfarin today -Monitor daily PT/INR -Monitor for bleeding  Albertina Parr, PharmD., BCPS Clinical Pharmacist Clinical phone for 10/22/18 until 10:30pm: [phone] If after 10:30pm, please refer to [facility] for unit-specific pharmacist

## 2018-10-22 NOTE — Telephone Encounter (Signed)
I tried to call back to advise that Dr. Sharol Given and Dr. Bradley Ferris decided that pt should proceed to the hospital for IV ABX and plan for revision surgery on Wednesday 10/24/18. Will hold message and try to reach again later on this afternoon.

## 2018-10-22 NOTE — Telephone Encounter (Signed)
I called and sw Hilda Blades and she advised that the pt is being swabbed for COVID-19 and the flu and that he is running a 103 temp and does not know if this is virus related or if the pt's BKA is infected. Dr. Alean Rinne has called to speak with Dr. Sharol Given and I advised that once they have decided what to do with the pt then I will call her back and advise of the plan. cb # [phone]

## 2018-10-22 NOTE — Telephone Encounter (Signed)
I called and sw wife to advise of plan.she would like for [place] to call her with an update once pt has been seen by Dr. Sharol Given or on the day of surgery. I advised that I would hold message in chart and keep updated.

## 2018-10-22 NOTE — Telephone Encounter (Signed)
Called triage and left message x 3 for pt. States the [facility] has advised pt has a 103 temp. Pt is s/p a BKA and on doxy. Wants a call back to advise what to do.

## 2018-10-22 NOTE — ED Notes (Signed)
Dr Niu at bedside 

## 2018-10-22 NOTE — ED Triage Notes (Signed)
Pt arrives [facility] from [facility] for care of necrotic wound at right BKA. Pt also advised he may have been exposed to Covid 19 by nurse at his facility and has been tested with lab pending.

## 2018-10-23 ENCOUNTER — Ambulatory Visit (INDEPENDENT_AMBULATORY_CARE_PROVIDER_SITE_OTHER): Payer: Medicaid Other | Admitting: Orthopedic Surgery

## 2018-10-23 ENCOUNTER — Telehealth (INDEPENDENT_AMBULATORY_CARE_PROVIDER_SITE_OTHER): Payer: Self-pay

## 2018-10-23 DIAGNOSIS — T8781 Dehiscence of amputation stump: Secondary | ICD-10-CM

## 2018-10-23 DIAGNOSIS — E43 Unspecified severe protein-calorie malnutrition: Secondary | ICD-10-CM

## 2018-10-23 LAB — BASIC METABOLIC PANEL
Anion gap: 7 (ref 5–15)
BUN: 69 mg/dL — ABNORMAL HIGH (ref 6–20)
CO2: 28 mmol/L (ref 22–32)
Calcium: 8.3 mg/dL — ABNORMAL LOW (ref 8.9–10.3)
Chloride: 103 mmol/L (ref 98–111)
Creatinine, Ser: 2.26 mg/dL — ABNORMAL HIGH (ref 0.61–1.24)
GFR calc Af Amer: 36 mL/min — ABNORMAL LOW (ref >60)
GFR, EST NON AFRICAN AMERICAN: 31 mL/min — AB (ref >60)
Glucose, Bld: 97 mg/dL (ref 70–99)
Potassium: 4.2 mmol/L (ref 3.5–5.1)
Sodium: 138 mmol/L (ref 135–145)

## 2018-10-23 LAB — CBC
HCT: 25.6 % — ABNORMAL LOW (ref 39.0–52.0)
Hemoglobin: 8.1 g/dL — ABNORMAL LOW (ref 13.0–17.0)
MCH: 27 pg (ref 26.0–34.0)
MCHC: 31.6 g/dL (ref 30.0–36.0)
MCV: 85.3 fL (ref 80.0–100.0)
Platelets: 582 10*3/uL — ABNORMAL HIGH (ref 150–400)
RBC: 3 MIL/uL — ABNORMAL LOW (ref 4.22–5.81)
RDW: 15.2 % (ref 11.5–15.5)
WBC: 18.2 10*3/uL — ABNORMAL HIGH (ref 4.0–10.5)
nRBC: 0 % (ref 0.0–0.2)

## 2018-10-23 LAB — TYPE AND SCREEN
ABO/RH(D): O POS
Antibody Screen: NEGATIVE

## 2018-10-23 LAB — GLUCOSE, CAPILLARY
Glucose-Capillary: 79 mg/dL (ref 70–99)
Glucose-Capillary: 64 mg/dL — ABNORMAL LOW (ref 70–99)
Glucose-Capillary: 128 mg/dL — ABNORMAL HIGH (ref 70–99)
Glucose-Capillary: 143 mg/dL — ABNORMAL HIGH (ref 70–99)

## 2018-10-23 LAB — PROTIME-INR
INR: 3.2 — AB (ref 0.8–1.2)
PROTHROMBIN TIME: 32 s — AB (ref 11.4–15.2)

## 2018-10-23 LAB — LACTIC ACID, PLASMA: Lactic Acid, Venous: 1.2 mmol/L (ref 0.5–1.9)

## 2018-10-23 LAB — HIV ANTIBODY (ROUTINE TESTING W REFLEX): HIV Screen 4th Generation wRfx: NONREACTIVE

## 2018-10-23 MED ORDER — VANCOMYCIN HCL 1000 MG IV SOLR
1000.0000 mg | INTRAVENOUS | Status: DC
  Administered 2018-10-23: 1000 mg via INTRAVENOUS
  Filled 2018-10-23 (×2): qty 1000

## 2018-10-23 NOTE — Progress Notes (Signed)
Pharmacy Antibiotic Note  Ethan Campbell is a 58 y.o. male admitted on 10/22/2018 with wound infection.  Pharmacy has been consulted for vancomycin dosing. Pt had a recent right BKA on 10/04/18 here with increasing redness and drainage from surgical incision. WBC 18.9. Plan for BKA > AKA revision o 4/3.  Noted SCr improvement 2.74 > 2.26 requiring Vancomyin adjustment.  Plan: -Vancomycin 1000 mg IV Q24 hrs. Goal AUC 400-550. Expected AUC: 482 SCr used: 2.26 -Monitor CBC, renal fx, cultures and clinical progress -Vanc trough/peak as needed    Height: 5\' 9"  (175.3 cm) Weight: 283 lb 11.7 oz (128.7 kg) IBW/kg (Calculated) : 70.7  Temp (24hrs), Avg:99.5 F (37.5 C), Min:98.4 F (36.9 C), Max:100.7 F (38.2 C)  Recent Labs  Lab 10/22/18 1652 10/22/18 2057 10/22/18 2337 10/23/18 0323  WBC 18.9*  --   --  18.2*  CREATININE 2.74*  --   --  2.26*  LATICACIDVEN  --  1.1 1.2  --     Estimated Creatinine Clearance: 47.9 mL/min (A) (by C-G formula based on SCr of 2.26 mg/dL (H)).    No Known Allergies   Thank you for allowing pharmacy to be a part of this patient's care.  [employer], Pharm.D., BCPS Clinical Pharmacist Clinical phone for 10/23/2018 from 8:30-4:00 is [phone].  **Pharmacist phone directory can now be found on [email or web address] ([email or web address]).  Listed under [place].  10/23/2018 9:18 AM

## 2018-10-23 NOTE — Progress Notes (Signed)
PROGRESS NOTE    Ethan Campbell  VQX:[id number] DOB: [birth year] DOA: 10/22/2018 PCP: Arsenio Katz, NP   Brief Narrative: Patient is a 58 year old male with history of hypertension, hyperlipidemia, diabetes type 2, hypothyroidism, depression, left arm DVT on Coumadin, bone cancer, OSA on CPAP, CKD stage III who presents with complaints of right leg wound infection.  Patient just had BKA of right lower extremity by orthopedics on 10/04/2018.  Wound VAC was taken down  6 days ago.  He was also having fever of 103 F and chills skilled nursing facility.  Patient stated that he had contact with Covid-19 health worker at the facility.  Test for COVID-19 sent.  Patient is completely asymptomatic. Orthopedics planning for right AKA on Friday.  Dr. Sharol Given following.  Assessment & Plan:   Principal Problem:   Wound infection after surgery Active Problems:   Essential hypertension, benign   Acute renal failure superimposed on stage 3 chronic kidney disease (HCC)   HLD (hyperlipidemia)   Below-knee amputation of right lower extremity (HCC)   Uncontrolled type 2 diabetes mellitus with polyneuropathy (HCC)   Normocytic anemia   Sepsis (HCC)   DVT (deep venous thrombosis) ([place])   Hypothyroidism   Close Exposure to Covid-19 Virus   Dehiscence of amputation stump (HCC)   Severe protein-calorie malnutrition ([place])   Suspected sepsis secondary to wound infection of the right lower extremity: History of recent BKA.  Right lower extremity wound appears dark, mottled.  Presented with fever, tachycardia, leukocytosis.  Hemodynamically stable.  Failed outpatient doxycycline treatment. Started on empiric antibiotics.  Culture sent.  Right lower extremity amputation site infection: Amputation site is dark, mottled.  Staples still there.  Orthopedics, Dr. Sharol Given, following and planning for right AKA on Friday .continue antibiotics.  Suspicion for Covid-19: Patient stated that he had contact with nursing staff  with COVID-19.  Test has been sent.  We will follow-up report.  Patient is completely asymptomatic.  No shortness of breath or cough.  Will discontinue airborne precaution.  Will continue on droplet precaution only.  Hypertension: Lotensin, Lasix held due to worsening renal function.  Continue PRN meds for now  AKI on CKD stage III: Baseline creatinine ranges from 2-2.3.  Presented with mild acute kidney injury.  Continue gentle IV fluids for today.  Consider discontinuing IV fluids tomorrow.  Type 2 diabetes mellitus: Hemoglobin A1c of 6.9 on 11/19.  On insulin pump.  Continue Lantus and sliding scale insulin for now.  Continue to monitor blood sugars  Hyperlipidemia: Continue home meds  Normocytic anemia: Chronic normocytic anemia most likely from the chronic comorbidities.  Continue to monitor H&H.  History of DVT: Currently on Coumadin.  Will switch to IV heparin when he closes to surgery date.  Hypothyroidism: Continue Synthyroid  Sleep apnea: Uses CPAP at night             PPE: Gown, gloves, N 95, face shield DVT prophylaxis: Coumadin Code Status: Full code Family Communication: None present at the bedside Disposition Plan: Home after full work-up, operative intervention.  Needs physical therapy evaluation eventually before discharge   Consultants: Orthopedics  Procedures: None  Antimicrobials:  Anti-infectives (From admission, onward)   Start     Dose/Rate Route Frequency Ordered Stop   10/24/18 0800  vancomycin (VANCOCIN) 1,500 mg in sodium chloride 0.9 % 500 mL IVPB  Status:  Discontinued     1,500 mg 250 mL/hr over 120 Minutes Intravenous Every 36 hours 10/22/18 1917 10/23/18 0919   10/23/18 2100  vancomycin (VANCOCIN) 1,000 mg in sodium chloride 0.9 % 250 mL IVPB     1,000 mg 250 mL/hr over 60 Minutes Intravenous Every 24 hours 10/23/18 0919     10/23/18 0000  cefTRIAXone (ROCEPHIN) 2 g in sodium chloride 0.9 % 100 mL IVPB     2 g 200 mL/hr over 30 Minutes  Intravenous Every 24 hours 10/22/18 2345     10/22/18 1930  ceFEPIme (MAXIPIME) 2 g in sodium chloride 0.9 % 100 mL IVPB  Status:  Discontinued     2 g 200 mL/hr over 30 Minutes Intravenous Every 24 hours 10/22/18 1917 10/22/18 2345   10/22/18 1930  vancomycin (VANCOCIN) 2,500 mg in sodium chloride 0.9 % 500 mL IVPB     2,500 mg 250 mL/hr over 120 Minutes Intravenous  Once 10/22/18 1917 10/22/18 2300      Subjective: Patient seen and examined the bedside this morning.  Appears comfortable.  Hemodynamically stable.  Denies any complaints.  Denies any shortness of breath, cough.  Has mild grade fever this morning.  Objective: Vitals:   10/22/18 2213 10/22/18 2344 10/22/18 2350 10/23/18 1030  BP: 115/74 (!) 141/56  (!) 142/43  Pulse: 92 94  91  Resp: 18   18  Temp: 98.4 F (36.9 C) (!) 100.7 F (38.2 C)  99.1 F (37.3 C)  TempSrc: Oral Oral  Oral  SpO2: 97% 97%  99%  Weight:   128.7 kg   Height:        Intake/Output Summary (Last 24 hours) at 10/23/2018 1205 Last data filed at 10/23/2018 1100 Gross per 24 hour  Intake 252.05 ml  Output 1700 ml  Net -1447.95 ml   Filed Weights   10/22/18 1627 10/22/18 2350  Weight: (!) 140.2 kg 128.7 kg    Examination:  General exam: Appears calm and comfortable ,Not in distress, obese HEENT:PERRL,Oral mucosa moist, Ear/Nose normal on gross exam Respiratory system: Bilateral equal air entry, normal vesicular breath sounds, no wheezes or crackles  Cardiovascular system: S1 & S2 heard, RRR. No JVD, murmurs, rubs, gallops or clicks. No pedal edema. Gastrointestinal system: Abdomen is nondistended, soft and nontender. No organomegaly or masses felt. Normal bowel sounds heard. Central nervous system: Alert and oriented. No focal neurological deficits. Extremities: Right BKA, BKA stump appears dark and mottled.  Staples in place Skin: No rashes, lesions or ulcers,no icterus ,no pallor   Data Reviewed: I have personally reviewed following labs  and imaging studies  CBC: Recent Labs  Lab 10/22/18 1652 10/23/18 0323  WBC 18.9* 18.2*  NEUTROABS 14.9*  --   HGB 8.9* 8.1*  HCT 30.2* 25.6*  MCV 87.3 85.3  PLT 680* 443*   Basic Metabolic Panel: Recent Labs  Lab 10/22/18 1652 10/23/18 0323  NA 136 138  K 4.7 4.2  CL 98 103  CO2 28 28  GLUCOSE 159* 97  BUN 81* 69*  CREATININE 2.74* 2.26*  CALCIUM 9.1 8.3*   GFR: Estimated Creatinine Clearance: 47.9 mL/min (A) (by C-G formula based on SCr of 2.26 mg/dL (H)). Liver Function Tests: Recent Labs  Lab 10/22/18 1652  AST 41  ALT 29  ALKPHOS 108  BILITOT 0.7  PROT 7.8  ALBUMIN 2.4*   No results for input(s): LIPASE, AMYLASE in the last 168 hours. No results for input(s): AMMONIA in the last 168 hours. Coagulation Profile: Recent Labs  Lab 10/22/18 2057 10/23/18 0323  INR 3.0* 3.2*   Cardiac Enzymes: No results for input(s): CKTOTAL, CKMB, CKMBINDEX, TROPONINI in  the last 168 hours. BNP (last 3 results) No results for input(s): PROBNP in the last 8760 hours. HbA1C: No results for input(s): HGBA1C in the last 72 hours. CBG: Recent Labs  Lab 10/22/18 2208 10/23/18 0807  GLUCAP 93 79   Lipid Profile: No results for input(s): CHOL, HDL, LDLCALC, TRIG, CHOLHDL, LDLDIRECT in the last 72 hours. Thyroid Function Tests: No results for input(s): TSH, T4TOTAL, FREET4, T3FREE, THYROIDAB in the last 72 hours. Anemia Panel: No results for input(s): VITAMINB12, FOLATE, FERRITIN, TIBC, IRON, RETICCTPCT in the last 72 hours. Sepsis Labs: Recent Labs  Lab 10/22/18 2057 10/22/18 2337  PROCALCITON 0.18  --   LATICACIDVEN 1.1 1.2    Recent Results (from the past 240 hour(s))  Blood culture (routine x 2)     Status: None (Preliminary result)   Collection Time: 10/22/18  5:00 PM  Result Value Ref Range Status   Specimen Description BLOOD RIGHT ANTECUBITAL  Final   Special Requests   Final    BOTTLES DRAWN AEROBIC AND ANAEROBIC Blood Culture results may not be  optimal due to an excessive volume of blood received in culture bottles   Culture   Final    NO GROWTH < 24 HOURS Performed at [facility] [address], [place], [place] [address]    Report Status PENDING  Incomplete  Blood culture (routine x 2)     Status: None (Preliminary result)   Collection Time: 10/22/18  5:10 PM  Result Value Ref Range Status   Specimen Description BLOOD LEFT ANTECUBITAL  Final   Special Requests   Final    BOTTLES DRAWN AEROBIC AND ANAEROBIC Blood Culture results may not be optimal due to an excessive volume of blood received in culture bottles   Culture   Final    NO GROWTH < 24 HOURS Performed at [facility], [place] [address], [place], [place] [address]    Report Status PENDING  Incomplete         Radiology Studies: Dg Femur Portable Min 2 Views Right  Result Date: 10/22/2018 CLINICAL DATA:  Right leg pain EXAM: RIGHT FEMUR PORTABLE 2 VIEW COMPARISON:  None. FINDINGS: There is no evidence of fracture or other focal bone lesions. Soft tissues are unremarkable. Right BKA with skin staples. Expected postoperative appearance. IMPRESSION: Expected postoperative appearance of right BKA.  No acute findings. Electronically Signed   By: Ulyses Jarred M.D.   On: 10/22/2018 19:15        Scheduled Meds: . aspirin EC  81 mg Oral Daily  . atorvastatin  80 mg Oral Daily  . DULoxetine  60 mg Oral Daily  . feeding supplement (PRO-STAT SUGAR FREE 64)  30 mL Oral TID  . fenofibrate  160 mg Oral Daily  . FLUoxetine  20 mg Oral Daily  . gabapentin  300 mg Oral BID  . insulin aspart  0-9 Units Subcutaneous TID WC  . insulin glargine  5 Units Subcutaneous Daily  . levothyroxine  100 mcg Oral Daily  . midodrine  5 mg Oral TID WC  . multivitamin with minerals  1 tablet Oral Daily  . tamsulosin  0.4 mg Oral Daily  . tiZANidine  4 mg Oral BID  . vitamin C  500 mg Oral BID  . zinc sulfate  220 mg Oral Daily   Continuous Infusions: . sodium chloride  Stopped (10/22/18 2101)  . cefTRIAXone (ROCEPHIN)  IV 2 g (10/23/18 0123)  . vancomycin  LOS: 1 day    Time spent: 35 mins.More than 50% of that time was spent in counseling and/or coordination of care.      Shelly Coss, MD [facility] Pager [phone]  If 7PM-7AM, please contact night-coverage [email or web address] Password [facility] 10/23/2018, 12:05 PM

## 2018-10-23 NOTE — H&P (View-Only) (Signed)
ORTHOPAEDIC CONSULTATION  REQUESTING PHYSICIAN: Shelly Coss, MD  Chief Complaint: Gangrene right BKA with fever and chills.  HPI: Ethan Campbell is a 58 y.o. male who presents with gangrene of the right below the knee amputation.  Patient states that 2 days ago he did have a fever up to 103.  Patient states he has no respiratory symptoms.  He is currently on nasal cannula because he usually uses a CPAP machine and did not bring this with him.  Patient does come from a skilled nursing facility that did have patients with COVID-19.  Patient currently is in isolation room.  Past Medical History:  Diagnosis Date  . Arm vein blood clot, left    on Coumadin  . Arthritis   . 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 Chronic kidney disease    stage 3  . Depression   . Essential hypertension   . History of stroke   . Hyperlipidemia   . Hypothyroidism   . Insomnia   . Obesity   . Precordial pain June 2011   Nuclear stress; no ischemia; EF 60%  . Presence of permanent cardiac pacemaker   . Proteinuria   . Seizure disorder ([place])   . Sick sinus syndrome ([place])   . Sleep apnea    Dr. Brandon Melnick, uses bipap  . Syncope   . Type 2 diabetes mellitus ([place])   . Venous insufficiency    Past Surgical History:  Procedure Laterality Date  . AMPUTATION Right 06/20/2018   Procedure: RIGHT GREAT TOE AMPUTATION;  Surgeon: Newt Minion, MD;  Location: [facility];  Service: Orthopedics;  Laterality: Right;  . AMPUTATION Right 09/26/2018   Procedure: RIGHT FOOT 5TH RAY AMPUTATION;  Surgeon: Newt Minion, MD;  Location: [facility];  Service: Orthopedics;  Laterality: Right;  MAC and regional anesthesia  . AMPUTATION Right 10/05/2018   Procedure: RIGHT BELOW KNEE AMPUTATION;  Surgeon: Newt Minion, MD;  Location: [facility];  Service: Orthopedics;  Laterality: Right;  . COLONOSCOPY    . RESECTION BONE TUMOR FEMUR  1980's   Left femur, treated at [facility] with bone graft  . TOTAL KNEE  ARTHROPLASTY Left 2013   Social History   Socioeconomic History  . Marital status: Single    Spouse name: Not on file  . Number of children: 0  . Years of education: GED  . Highest education level: Not on file  Occupational History  . Occupation: Disabled  Social Needs  . Financial resource strain: Not on file  . Food insecurity:    Worry: Not on file    Inability: Not on file  . Transportation needs:    Medical: Not on file    Non-medical: Not on file  Tobacco Use  . Smoking status: Former Smoker    Types: Cigarettes  . Smokeless tobacco: Never Used  . Tobacco comment: 11/27/14 "quit smoking years ago"  Substance and Sexual Activity  . Alcohol use: No    Alcohol/week: 0.0 standard drinks  . Drug use: No  . Sexual activity: Never  Lifestyle  . Physical activity:    Days per week: Not on file    Minutes per session: Not on file  . Stress: Not on file  Relationships  . Social connections:    Talks on phone: Not on file    Gets together: Not on file    Attends religious service: Not on file    Active  member of club or organization: Not on file    Attends meetings of clubs or organizations: Not on file    Relationship status: Not on file  Other Topics Concern  . Not on file  Social History Narrative  . Not on file   Family History  Problem Relation Age of Onset  . Heart attack Mother 49  . Breast cancer Mother   . Stroke Mother   . Heart attack Father 38  . Breast cancer Sister   . Colon cancer Sister    - negative except otherwise stated in the family history section No Known Allergies Prior to Admission medications   Medication Sig Start Date End Date Taking? Authorizing Provider  acetaminophen (TYLENOL) 325 MG tablet Take 650 mg by mouth every 4 (four) hours as needed (fever of 101 or higher).   Yes [provider]  acetaminophen (TYLENOL) 500 MG tablet Take 1,000 mg by mouth every 6 (six) hours as needed (pain/ temp of 101 or higher).   Yes  [provider]  aspirin EC 81 MG tablet Take 81 mg by mouth daily.    Yes [provider]  atorvastatin (LIPITOR) 80 MG tablet TAKE 1 TABLET DAILY. Patient taking differently: Take 80 mg by mouth daily.  04/29/14  Yes Elayne Snare, MD  benazepril (LOTENSIN) 20 MG tablet Take 20 mg by mouth daily.   Yes [provider]  doxycycline (VIBRA-TABS) 100 MG tablet Take 1 tablet (100 mg total) by mouth 2 (two) times daily. 10/16/18  Yes Newt Minion, MD  DULoxetine (CYMBALTA) 60 MG capsule Take 60 mg by mouth daily.    Yes [provider]  fenofibrate (TRICOR) 145 MG tablet Take 145 mg by mouth daily.    Yes [provider]  FLUoxetine (PROZAC) 20 MG capsule Take 20 mg by mouth daily.   Yes [provider]  furosemide (LASIX) 40 MG tablet Take 40 mg by mouth 2 (two) times daily.    Yes [provider]  gabapentin (NEURONTIN) 300 MG capsule Take 300 mg by mouth 2 (two) times daily.    Yes [provider]  Insulin Disposable Pump (OMNIPOD 5 PACK) MISC 1 Package by Does not apply route every 3 (three) days. 05/01/18  Yes Elayne Snare, MD  levothyroxine (SYNTHROID, LEVOTHROID) 100 MCG tablet Take 1 tablet daily before breakfast Patient taking differently: Take 100 mcg by mouth daily. Take 1 tablet daily before breakfast 06/22/18  Yes Elayne Snare, MD  liraglutide (VICTOZA) 18 MG/3ML SOPN USE 1.8MG SUBCUTANEOUSLY DAILY. Patient taking differently: Inject 1.8 mg into the skin daily.  03/29/18  Yes Elayne Snare, MD  meclizine (ANTIVERT) 25 MG tablet Take 25 mg by mouth every 8 (eight) hours as needed for dizziness.    Yes [provider]  midodrine (PROAMATINE) 5 MG tablet Take 5 mg by mouth 3 (three) times daily.    Yes [provider]  Multiple Vitamin (MULTIVITAMIN WITH MINERALS) TABS tablet Take 1 tablet by mouth daily.   Yes [provider]  Nutritional Supplements (PROMOD) LIQD Take 30 mLs by mouth 3 (three) times  daily.   Yes [provider]  oxyCODONE-acetaminophen (PERCOCET/ROXICET) 5-325 MG tablet Take 1 tablet by mouth every 4 (four) hours as needed. Patient taking differently: Take 1 tablet by mouth every 4 (four) hours as needed (pain).  10/10/18  Yes Newt Minion, MD  PRESCRIPTION MEDICATION Inhale into the lungs at bedtime. CPAP   Yes [provider]  tamsulosin (  FLOMAX) 0.4 MG CAPS capsule Take 0.4 mg by mouth daily.    Yes [provider]  tiZANidine (ZANAFLEX) 4 MG tablet Take 4 mg by mouth 2 (two) times daily.   Yes [provider]  vitamin C (ASCORBIC ACID) 500 MG tablet Take 500 mg by mouth 2 (two) times daily.   Yes [provider]  warfarin (COUMADIN) 10 MG tablet Take 10 mg by mouth See admin instructions. Take one tablet (10 mg) by mouth every Monday, Wednesday, Friday at 5pm   Yes [provider]  warfarin (COUMADIN) 7.5 MG tablet Take 7.5 mg by mouth See admin instructions. Take one tablet (7.5 mg) by mouth every Sunday, Tuesday, Thursday, Saturday at 5pm   Yes [provider]  zinc sulfate 220 (50 Zn) MG capsule Take 220 mg by mouth daily.   Yes [provider]  glucose blood (ACCU-CHEK AVIVA PLUS) test strip TEST UP TO 4 TIMES DAILY Dx code E11.65 02/28/18   Elayne Snare, MD  glucose blood (FREESTYLE LITE) test strip Check sugar 3 times daily 06/18/18   Elayne Snare, MD  HUMULIN R 500 UNIT/ML injection USE AS DIRECTED, INJECT UP TO 30 UNITS 3 TIMES A DAY. Patient not taking: Reported on 10/22/2018 08/21/18   Elayne Snare, MD   Dg Femur Portable Min 2 Views Right  Result Date: 10/22/2018 CLINICAL DATA:  Right leg pain EXAM: RIGHT FEMUR PORTABLE 2 VIEW COMPARISON:  None. FINDINGS: There is no evidence of fracture or other focal bone lesions. Soft tissues are unremarkable. Right BKA with skin staples. Expected postoperative appearance. IMPRESSION: Expected postoperative appearance of right BKA.  No acute findings.  Electronically Signed   By: Ulyses Jarred M.D.   On: 10/22/2018 19:15   - pertinent xrays, CT, MRI studies were reviewed and independently interpreted  Positive ROS: All other systems have been reviewed and were otherwise negative with the exception of those mentioned in the HPI and as above.  Physical Exam: General: Alert, no acute distress Psychiatric: Patient is competent for consent with normal mood and affect Lymphatic: No axillary or cervical lymphadenopathy Cardiovascular: No pedal edema Respiratory: No cyanosis, no use of accessory musculature GI: No organomegaly, abdomen is soft and non-tender    Images:  _0 @  Labs:  Lab Results  Component Value Date   HGBA1C 6.9 (A) 06/15/2018   HGBA1C 10.9 (A) 02/21/2018   HGBA1C 13.4 09/11/2017   ESRSEDRATE 130 (H) 10/22/2018   CRP 15.1 (H) 10/22/2018   REPTSTATUS PENDING 10/22/2018   CULT  10/22/2018    NO GROWTH < 24 HOURS Performed at [facility], [place] [address], [place], [place] [address]     Lab Results  Component Value Date   ALBUMIN 2.4 (L) 10/22/2018   ALBUMIN 3.9 09/30/2015   ALBUMIN 4.1 09/03/2015    Neurologic: Patient does not have protective sensation bilateral lower extremities.   MUSCULOSKELETAL:   Skin: Examination patient has extensive gangrenous necrosis of the right transtibial amputation.  There is no ascending cellulitis proximal to the knee joint.  Patient denies any cough or respiratory symptoms.  He is on nasal cannula FiO2 because he normally uses a CPAP machine at night.  Patient does have an elevated white cell count that has increased since his admission.  Patient has uncontrolled type 2 diabetes with severe protein caloric malnutrition.  Radiographs are reviewed and show no air in the soft tissue.  There is no crepitation in the soft tissue to palpation.  Patient was  seen with full COVID-19 protocol precautions with an observer.  Assessment: Assessment: Uncontrolled type 2  diabetes with severe protein caloric malnutrition with gangrenous dehiscence of the right transtibial amputation.  Plan: Plan: Patient will plan for a right above-the-knee amputation on Friday.  I did call his wife and informed her of the findings and the need for revision surgery.  It seems that patient's fever and symptoms are coming from the gangrene of the residual limb.   Patient is currently in no distress and is comfortable.  If patient's condition deteriorates I would proceed with surgery sooner.  Thank you for the consult and the opportunity to see Mr. Lion Fernandez, [place] [phone] 9:53 AM

## 2018-10-23 NOTE — Progress Notes (Signed)
ANTICOAGULATION CONSULT NOTE - Initial Consult  Pharmacy Consult for Heparin (warfarin on hold) Indication: History of VTE  No Known Allergies  Patient Measurements: Height: 5\' 9"  (175.3 cm) Weight: 283 lb 11.7 oz (128.7 kg) IBW/kg (Calculated) : 70.7  Vital Signs: Temp: 100.7 F (38.2 C) (03/30 2344) Temp Source: Oral (03/30 2344) BP: 141/56 (03/30 2344) Pulse Rate: 94 (03/30 2344)  Labs: Recent Labs    10/22/18 1652 10/22/18 2057  HGB 8.9*  --   HCT 30.2*  --   PLT 680*  --   APTT  --  67*  LABPROT  --  30.4*  INR  --  3.0*  CREATININE 2.74*  --     Estimated Creatinine Clearance: 39.5 mL/min (A) (by C-G formula based on SCr of 2.74 mg/dL (H)).   Medical History: Past Medical History:  Diagnosis Date  . Arm vein blood clot, left    on Coumadin  . Arthritis   . Cancer ([place])    Bone cancer 1987 (in knee Large cell tumor)  . Chronic kidney disease    stage 3  . Depression   . Essential hypertension   . History of stroke   . Hyperlipidemia   . Hypothyroidism   . Insomnia   . Obesity   . Precordial pain June 2011   Nuclear stress; no ischemia; EF 60%  . Presence of permanent cardiac pacemaker   . Proteinuria   . Seizure disorder ([place])   . Sick sinus syndrome ([place])   . Sleep apnea    Dr. Brandon Melnick, uses bipap  . Syncope   . Type 2 diabetes mellitus ([place])   . Venous insufficiency      Assessment: 58 y/o M here from nursing facility for wound infection, holding warfarin and starting heparin in case surgical procedure is needed on wound, INR is 3, Hgb 8.9  Goal of Therapy:  Heparin level 0.3-0.7 units/ml Monitor platelets by anticoagulation protocol: Yes   Plan:  Daily PT/INR Start heparin when INR is <2  Narda Bonds 10/23/2018,2:06 AM

## 2018-10-23 NOTE — Telephone Encounter (Signed)
Dr. Sharol Given called and sw pt's wife to advise he has seen the pt in consult this morning. The pt will require an AKA and Dr. Sharol Given is looking to put the pt on the sch for Friday. Explained procedure to the pt's wife reviewed risks and benefits.

## 2018-10-23 NOTE — Progress Notes (Signed)
Patient is being ruled our for COVID-19 CPAP not indicated at this time.

## 2018-10-23 NOTE — Consult Note (Signed)
ORTHOPAEDIC CONSULTATION  REQUESTING PHYSICIAN: Shelly Coss, MD  Chief Complaint: Gangrene right BKA with fever and chills.  HPI: Ethan Campbell is a 58 y.o. male who presents with gangrene of the right below the knee amputation.  Patient states that 2 days ago he did have a fever up to 103.  Patient states he has no respiratory symptoms.  He is currently on nasal cannula because he usually uses a CPAP machine and did not bring this with him.  Patient does come from a skilled nursing facility that did have patients with COVID-19.  Patient currently is in isolation room.  Past Medical History:  Diagnosis Date  . Arm vein blood clot, left    on Coumadin  . Arthritis   . Cancer ([place])    Bone cancer 1987 (in knee Large cell tumor)  . Chronic kidney disease    stage 3  . Depression   . Essential hypertension   . History of stroke   . Hyperlipidemia   . Hypothyroidism   . Insomnia   . Obesity   . Precordial pain June 2011   Nuclear stress; no ischemia; EF 60%  . Presence of permanent cardiac pacemaker   . Proteinuria   . Seizure disorder ([place])   . Sick sinus syndrome ([place])   . Sleep apnea    Dr. Brandon Melnick, uses bipap  . Syncope   . Type 2 diabetes mellitus ([place])   . Venous insufficiency    Past Surgical History:  Procedure Laterality Date  . AMPUTATION Right 06/20/2018   Procedure: RIGHT GREAT TOE AMPUTATION;  Surgeon: Newt Minion, MD;  Location: [facility];  Service: Orthopedics;  Laterality: Right;  . AMPUTATION Right 09/26/2018   Procedure: RIGHT FOOT 5TH RAY AMPUTATION;  Surgeon: Newt Minion, MD;  Location: [facility];  Service: Orthopedics;  Laterality: Right;  MAC and regional anesthesia  . AMPUTATION Right 10/05/2018   Procedure: RIGHT BELOW KNEE AMPUTATION;  Surgeon: Newt Minion, MD;  Location: [facility];  Service: Orthopedics;  Laterality: Right;  . COLONOSCOPY    . RESECTION BONE TUMOR FEMUR  1980's   Left femur, [facility] at [facility] with bone graft  . TOTAL KNEE  ARTHROPLASTY Left 2013   Social History   Socioeconomic History  . Marital status: Single    Spouse name: Not on file  . Number of children: 0  . Years of education: GED  . Highest education level: Not on file  Occupational History  . Occupation: Disabled  Social Needs  . Financial resource strain: Not on file  . Food insecurity:    Worry: Not on file    Inability: Not on file  . Transportation needs:    Medical: Not on file    Non-medical: Not on file  Tobacco Use  . Smoking status: Former Smoker    Types: Cigarettes  . Smokeless tobacco: Never Used  . Tobacco comment: 11/27/14 "quit smoking years ago"  Substance and Sexual Activity  . Alcohol use: No    Alcohol/week: 0.0 standard drinks  . Drug use: No  . Sexual activity: Never  Lifestyle  . Physical activity:    Days per week: Not on file    Minutes per session: Not on file  . Stress: Not on file  Relationships  . Social connections:    Talks on phone: Not on file    Gets together: Not on file    Attends religious service: Not on file    Active  member of club or organization: Not on file    Attends meetings of clubs or organizations: Not on file    Relationship status: Not on file  Other Topics Concern  . Not on file  Social History Narrative  . Not on file   Family History  Problem Relation Age of Onset  . Heart attack Mother 43  . Breast cancer Mother   . Stroke Mother   . Heart attack Father 96  . Breast cancer Sister   . Colon cancer Sister    - negative except otherwise stated in the family history section No Known Allergies Prior to Admission medications   Medication Sig Start Date End Date Taking? Authorizing Provider  acetaminophen (TYLENOL) 325 MG tablet Take 650 mg by mouth every 4 (four) hours as needed (fever of 101 or higher).   Yes [provider]  acetaminophen (TYLENOL) 500 MG tablet Take 1,000 mg by mouth every 6 (six) hours as needed (pain/ temp of 101 or higher).   Yes  [provider]  aspirin EC 81 MG tablet Take 81 mg by mouth daily.    Yes [provider]  atorvastatin (LIPITOR) 80 MG tablet TAKE 1 TABLET DAILY. Patient taking differently: Take 80 mg by mouth daily.  04/29/14  Yes Elayne Snare, MD  benazepril (LOTENSIN) 20 MG tablet Take 20 mg by mouth daily.   Yes [provider]  doxycycline (VIBRA-TABS) 100 MG tablet Take 1 tablet (100 mg total) by mouth 2 (two) times daily. 10/16/18  Yes Newt Minion, MD  DULoxetine (CYMBALTA) 60 MG capsule Take 60 mg by mouth daily.    Yes [provider]  fenofibrate (TRICOR) 145 MG tablet Take 145 mg by mouth daily.    Yes [provider]  FLUoxetine (PROZAC) 20 MG capsule Take 20 mg by mouth daily.   Yes [provider]  furosemide (LASIX) 40 MG tablet Take 40 mg by mouth 2 (two) times daily.    Yes [provider]  gabapentin (NEURONTIN) 300 MG capsule Take 300 mg by mouth 2 (two) times daily.    Yes [provider]  Insulin Disposable Pump (OMNIPOD 5 PACK) MISC 1 Package by Does not apply route every 3 (three) days. 05/01/18  Yes Elayne Snare, MD  levothyroxine (SYNTHROID, LEVOTHROID) 100 MCG tablet Take 1 tablet daily before breakfast Patient taking differently: Take 100 mcg by mouth daily. Take 1 tablet daily before breakfast 06/22/18  Yes Elayne Snare, MD  liraglutide (VICTOZA) 18 MG/3ML SOPN USE 1.8MG SUBCUTANEOUSLY DAILY. Patient taking differently: Inject 1.8 mg into the skin daily.  03/29/18  Yes Elayne Snare, MD  meclizine (ANTIVERT) 25 MG tablet Take 25 mg by mouth every 8 (eight) hours as needed for dizziness.    Yes [provider]  midodrine (PROAMATINE) 5 MG tablet Take 5 mg by mouth 3 (three) times daily.    Yes [provider]  Multiple Vitamin (MULTIVITAMIN WITH MINERALS) TABS tablet Take 1 tablet by mouth daily.   Yes [provider]  Nutritional Supplements (PROMOD) LIQD Take 30 mLs by mouth 3 (three) times  daily.   Yes [provider]  oxyCODONE-acetaminophen (PERCOCET/ROXICET) 5-325 MG tablet Take 1 tablet by mouth every 4 (four) hours as needed. Patient taking differently: Take 1 tablet by mouth every 4 (four) hours as needed (pain).  10/10/18  Yes Newt Minion, MD  PRESCRIPTION MEDICATION Inhale into the lungs at bedtime. CPAP   Yes [provider]  tamsulosin (  FLOMAX) 0.4 MG CAPS capsule Take 0.4 mg by mouth daily.    Yes [provider]  tiZANidine (ZANAFLEX) 4 MG tablet Take 4 mg by mouth 2 (two) times daily.   Yes [provider]  vitamin C (ASCORBIC ACID) 500 MG tablet Take 500 mg by mouth 2 (two) times daily.   Yes [provider]  warfarin (COUMADIN) 10 MG tablet Take 10 mg by mouth See admin instructions. Take one tablet (10 mg) by mouth every Monday, Wednesday, Friday at 5pm   Yes [provider]  warfarin (COUMADIN) 7.5 MG tablet Take 7.5 mg by mouth See admin instructions. Take one tablet (7.5 mg) by mouth every Sunday, Tuesday, Thursday, Saturday at 5pm   Yes [provider]  zinc sulfate 220 (50 Zn) MG capsule Take 220 mg by mouth daily.   Yes [provider]  glucose blood (ACCU-CHEK AVIVA PLUS) test strip TEST UP TO 4 TIMES DAILY Dx code E11.65 02/28/18   Elayne Snare, MD  glucose blood (FREESTYLE LITE) test strip Check sugar 3 times daily 06/18/18   Elayne Snare, MD  HUMULIN R 500 UNIT/ML injection USE AS DIRECTED, INJECT UP TO 30 UNITS 3 TIMES A DAY. Patient not taking: Reported on 10/22/2018 08/21/18   Elayne Snare, MD   Dg Femur Portable Min 2 Views Right  Result Date: 10/22/2018 CLINICAL DATA:  Right leg pain EXAM: RIGHT FEMUR PORTABLE 2 VIEW COMPARISON:  None. FINDINGS: There is no evidence of fracture or other focal bone lesions. Soft tissues are unremarkable. Right BKA with skin staples. Expected postoperative appearance. IMPRESSION: Expected postoperative appearance of right BKA.  No acute findings.  Electronically Signed   By: Ulyses Jarred M.D.   On: 10/22/2018 19:15   - pertinent xrays, CT, MRI studies were reviewed and independently interpreted  Positive ROS: All other systems have been reviewed and were otherwise negative with the exception of those mentioned in the HPI and as above.  Physical Exam: General: Alert, no acute distress Psychiatric: Patient is competent for consent with normal mood and affect Lymphatic: No axillary or cervical lymphadenopathy Cardiovascular: No pedal edema Respiratory: No cyanosis, no use of accessory musculature GI: No organomegaly, abdomen is soft and non-tender    Images:  _0 @  Labs:  Lab Results  Component Value Date   HGBA1C 6.9 (A) 06/15/2018   HGBA1C 10.9 (A) 02/21/2018   HGBA1C 13.4 09/11/2017   ESRSEDRATE 130 (H) 10/22/2018   CRP 15.1 (H) 10/22/2018   REPTSTATUS PENDING 10/22/2018   CULT  10/22/2018    NO GROWTH < 24 HOURS Performed at [facility], [place] [address] [place], [place] [address]     Lab Results  Component Value Date   ALBUMIN 2.4 (L) 10/22/2018   ALBUMIN 3.9 09/30/2015   ALBUMIN 4.1 09/03/2015    Neurologic: Patient does not have protective sensation bilateral lower extremities.   MUSCULOSKELETAL:   Skin: Examination patient has extensive gangrenous necrosis of the right transtibial amputation.  There is no ascending cellulitis proximal to the knee joint.  Patient denies any cough or respiratory symptoms.  He is on nasal cannula FiO2 because he normally uses a CPAP machine at night.  Patient does have an elevated white cell count that has increased since his admission.  Patient has uncontrolled type 2 diabetes with severe protein caloric malnutrition.  Radiographs are reviewed and show no air in the soft tissue.  There is no crepitation in the soft tissue to palpation.  Patient was  seen with full COVID-19 protocol precautions with an observer.  Assessment: Assessment: Uncontrolled type 2  diabetes with severe protein caloric malnutrition with gangrenous dehiscence of the right transtibial amputation.  Plan: Plan: Patient will plan for a right above-the-knee amputation on Friday.  I did call his wife and informed her of the findings and the need for revision surgery.  It seems that patient's fever and symptoms are coming from the gangrene of the residual limb.   Patient is currently in no distress and is comfortable.  If patient's condition deteriorates I would proceed with surgery sooner.  Thank you for the consult and the opportunity to see Mr. Creek Gan, [place] [phone] 9:53 AM

## 2018-10-23 NOTE — Progress Notes (Signed)
Daughter crystal dropped off CPAP machine and tablet. CPAP in patients closet inside a white bag with patient label on it.

## 2018-10-23 NOTE — Consult Note (Signed)
[facility] Nurse wound consult note Patient receiving care in [facility] 2W28.  On COVID 19 precautions from exposure to known COVID 19 case.  I have reviewed the record, including the photos.  Thank you for the photos in the record. Reason for Consult: R BKA surgical infection Wound type: as above Measurement: Incision line appears intact Drainage (amount, consistency, odor) heavy serosaniginous Periwound: discolored purple/red Dressing procedure/placement/frequency: Place Aquacel Ag Kellie Simmering [phone]) along the right leg stump incision line. Apply ABD pads over that, then wrap with kerlex. Change daily.  If you have not done so, reach out to Dr. Sharol Given for additional guidance. Monitor the wound area(s) for worsening of condition such as: Signs/symptoms of infection,  Increase in size,  Development of or worsening of odor, Development of pain, or increased pain at the affected locations.  Notify the medical team if any of these develop.  Thank you for the consult.  [facility] nurse will not follow at this time.  Please re-consult the [facility] team if needed.  Val Riles, RN, MSN, CWOCN, CNS-BC, pager [phone]

## 2018-10-23 NOTE — Telephone Encounter (Signed)
Dr. Sharol Given sw pt's wife today. Will hold message for Dr. Sharol Given to call on Friday to give update after surgery.

## 2018-10-24 DIAGNOSIS — D649 Anemia, unspecified: Secondary | ICD-10-CM

## 2018-10-24 DIAGNOSIS — Z794 Long term (current) use of insulin: Secondary | ICD-10-CM

## 2018-10-24 DIAGNOSIS — E11649 Type 2 diabetes mellitus with hypoglycemia without coma: Secondary | ICD-10-CM

## 2018-10-24 LAB — CBC WITH DIFFERENTIAL/PLATELET
Abs Immature Granulocytes: 0.04 10*3/uL (ref 0.00–0.07)
BASOS ABS: 0 10*3/uL (ref 0.0–0.1)
Basophils Relative: 0 %
Eosinophils Absolute: 0.1 10*3/uL (ref 0.0–0.5)
Eosinophils Relative: 1 %
HCT: 30.1 % — ABNORMAL LOW (ref 39.0–52.0)
Hemoglobin: 9.1 g/dL — ABNORMAL LOW (ref 13.0–17.0)
Immature Granulocytes: 0 %
Lymphocytes Relative: 11 %
Lymphs Abs: 1.3 10*3/uL (ref 0.7–4.0)
MCH: 26.8 pg (ref 26.0–34.0)
MCHC: 30.2 g/dL (ref 30.0–36.0)
MCV: 88.8 fL (ref 80.0–100.0)
Monocytes Absolute: 1.1 10*3/uL — ABNORMAL HIGH (ref 0.1–1.0)
Monocytes Relative: 10 %
NEUTROS ABS: 8.7 10*3/uL — AB (ref 1.7–7.7)
Neutrophils Relative %: 78 %
Platelets: 472 10*3/uL — ABNORMAL HIGH (ref 150–400)
RBC: 3.39 MIL/uL — ABNORMAL LOW (ref 4.22–5.81)
RDW: 15.3 % (ref 11.5–15.5)
WBC: 11.2 10*3/uL — ABNORMAL HIGH (ref 4.0–10.5)
nRBC: 0 % (ref 0.0–0.2)

## 2018-10-24 LAB — BASIC METABOLIC PANEL
Anion gap: 13 (ref 5–15)
BUN: 54 mg/dL — ABNORMAL HIGH (ref 6–20)
CO2: 21 mmol/L — ABNORMAL LOW (ref 22–32)
Calcium: 8.6 mg/dL — ABNORMAL LOW (ref 8.9–10.3)
Chloride: 105 mmol/L (ref 98–111)
Creatinine, Ser: 1.78 mg/dL — ABNORMAL HIGH (ref 0.61–1.24)
GFR calc non Af Amer: 41 mL/min — ABNORMAL LOW (ref >60)
GFR, EST AFRICAN AMERICAN: 48 mL/min — AB (ref >60)
Glucose, Bld: 53 mg/dL — ABNORMAL LOW (ref 70–99)
Potassium: 4.3 mmol/L (ref 3.5–5.1)
Sodium: 139 mmol/L (ref 135–145)

## 2018-10-24 LAB — GLUCOSE, CAPILLARY
Glucose-Capillary: 54 mg/dL — ABNORMAL LOW (ref 70–99)
Glucose-Capillary: 154 mg/dL — ABNORMAL HIGH (ref 70–99)
Glucose-Capillary: 231 mg/dL — ABNORMAL HIGH (ref 70–99)

## 2018-10-24 LAB — PROTIME-INR
INR: 3.1 — ABNORMAL HIGH (ref 0.8–1.2)
Prothrombin Time: 31.7 seconds — ABNORMAL HIGH (ref 11.4–15.2)

## 2018-10-24 MED ORDER — INSULIN ASPART 100 UNIT/ML ~~LOC~~ SOLN
0.0000 [IU] | Freq: Every day | SUBCUTANEOUS | Status: DC
  Administered 2018-10-24 – 2018-10-30 (×3): 2 [IU] via SUBCUTANEOUS

## 2018-10-24 MED ORDER — INSULIN GLARGINE 100 UNIT/ML ~~LOC~~ SOLN
40.0000 [IU] | Freq: Every day | SUBCUTANEOUS | Status: DC
  Administered 2018-10-24 – 2018-10-28 (×5): 40 [IU] via SUBCUTANEOUS
  Filled 2018-10-24 (×6): qty 0.4

## 2018-10-24 MED ORDER — INSULIN ASPART 100 UNIT/ML ~~LOC~~ SOLN
0.0000 [IU] | Freq: Three times a day (TID) | SUBCUTANEOUS | Status: DC
  Administered 2018-10-24: 3 [IU] via SUBCUTANEOUS
  Administered 2018-10-25 – 2018-10-26 (×4): 5 [IU] via SUBCUTANEOUS
  Administered 2018-10-27: 2 [IU] via SUBCUTANEOUS
  Administered 2018-10-27 (×2): 5 [IU] via SUBCUTANEOUS
  Administered 2018-10-28: 3 [IU] via SUBCUTANEOUS
  Administered 2018-10-28 (×2): 5 [IU] via SUBCUTANEOUS
  Administered 2018-10-29: 2 [IU] via SUBCUTANEOUS
  Administered 2018-10-29: 5 [IU] via SUBCUTANEOUS
  Administered 2018-10-30: 3 [IU] via SUBCUTANEOUS
  Administered 2018-10-30: 5 [IU] via SUBCUTANEOUS
  Administered 2018-10-31 (×2): 3 [IU] via SUBCUTANEOUS

## 2018-10-24 MED ORDER — INSULIN ASPART 100 UNIT/ML ~~LOC~~ SOLN
6.0000 [IU] | Freq: Three times a day (TID) | SUBCUTANEOUS | Status: DC
  Administered 2018-10-25 – 2018-10-28 (×8): 6 [IU] via SUBCUTANEOUS

## 2018-10-24 MED ORDER — VANCOMYCIN HCL 10 G IV SOLR
1250.0000 mg | INTRAVENOUS | Status: DC
  Administered 2018-10-24 – 2018-10-26 (×3): 1250 mg via INTRAVENOUS
  Filled 2018-10-24 (×4): qty 1250

## 2018-10-24 NOTE — Progress Notes (Signed)
[facility] for Heparin (warfarin on hold) Indication: History of VTE  No Known Allergies  Patient Measurements: Height: 5\' 9"  (175.3 cm) Weight: 283 lb 11.7 oz (128.7 kg) IBW/kg (Calculated) : 70.7  Vital Signs: Temp: 98.9 F (37.2 C) (04/01 0742) Temp Source: Oral (04/01 0742) BP: 139/50 (04/01 0742) Pulse Rate: 102 (04/01 0742)  Labs: Recent Labs    10/22/18 1652 10/22/18 2057 10/23/18 0323 10/24/18 0523  HGB 8.9*  --  8.1* 9.1*  HCT 30.2*  --  25.6* 30.1*  PLT 680*  --  582* 472*  APTT  --  67*  --   --   LABPROT  --  30.4* 32.0*  --   INR  --  3.0* 3.2*  --   CREATININE 2.74*  --  2.26* 1.78*    Estimated Creatinine Clearance: 60.8 mL/min (A) (by C-G formula based on SCr of 1.78 mg/dL (H)).   Medical History: Past Medical History:  Diagnosis Date  . Arm vein blood clot, left    on Coumadin  . Arthritis   . Cancer ([place])    Bone cancer 1987 (in knee Large cell tumor)  . Chronic kidney disease    stage 3  . Depression   . Essential hypertension   . History of stroke   . Hyperlipidemia   . Hypothyroidism   . Insomnia   . Obesity   . Precordial pain June 2011   Nuclear stress; no ischemia; EF 60%  . Presence of permanent cardiac pacemaker   . Proteinuria   . Seizure disorder ([place])   . Sick sinus syndrome ([place])   . Sleep apnea    Dr. Brandon Melnick, uses bipap  . Syncope   . Type 2 diabetes mellitus ([place])   . Venous insufficiency     Assessment: 58 y/o M here from nursing facility for wound infection, holding warfarin and starting heparin in case surgical procedure is needed on wound. INR 3.1 this AM. CBC stable. No active bleed issues documented.  Goal of Therapy:  Heparin level 0.3-0.7 units/ml Monitor platelets by anticoagulation protocol: Yes   Plan:  Daily PT/INR Start heparin when INR is <2 Monitor CBC, s/sx bleeding  Elicia Lamp, PharmD, BCPS Please check AMION for all [facility] contact  numbers Clinical Pharmacist 10/24/2018 9:19 AM

## 2018-10-24 NOTE — Progress Notes (Signed)
PROGRESS NOTE    Ethan Campbell  CZY:[id number] DOB: February 12, 1961 DOA: 10/22/2018 PCP: Arsenio Katz, NP   Brief Narrative: Patient is a 58 year old male with history of hypertension, hyperlipidemia, diabetes type 2 on insulin pump, hypothyroidism, depression, left arm DVT on Coumadin, bone cancer, OSA on CPAP, CKD stage III who presented with complaints of right leg wound infection. Patient just had BKA of right lower extremity by orthopedics on 10/04/2018.  Wound VAC was taken down  6 days ago.  He was also having fever of 103 F and chills at skilled nursing facility.  Patient stated that he had contact with Covid-19 positive health worker at the facility.  Test for COVID-19 sent & pending.  Patient is completely asymptomatic. Orthopedics planning for right AKA on Friday 4/3.  Dr. Sharol Given following.  Assessment & Plan:   Principal Problem:   Wound infection after surgery Active Problems:   Essential hypertension, benign   Acute renal failure superimposed on stage 3 chronic kidney disease (HCC)   HLD (hyperlipidemia)   Below-knee amputation of right lower extremity (HCC)   Uncontrolled type 2 diabetes mellitus with polyneuropathy (HCC)   Normocytic anemia   Sepsis (HCC)   DVT (deep venous thrombosis) ([place])   Hypothyroidism   Close Exposure to Covid-19 Virus   Dehiscence of amputation stump (HCC)   Severe protein-calorie malnutrition ([place])   Suspected sepsis secondary to postop wound infection of the right lower extremity:  History of recent BKA.   Right lower extremity wound appears dark, mottled.   Presented with fever, tachycardia, leukocytosis.  Hemodynamically stable.   Failed outpatient doxycycline treatment. Started on empiric antibiotics, IV ceftriaxone and vancomycin.  Blood cultures x2: Negative to date. Sepsis physiology resolved, defervesced, leukocytosis has improved  Right lower extremity amputation site infection:  Amputation site is dark, mottled.  Staples still  there.   Orthopedics, Dr. Sharol Given, following and planning for right AKA on Friday 4/3. Continue antibiotics.  Suspicion for Covid-19:  Patient stated that he had contact with nursing staff with COVID-19.  Test has been sent.  We will follow-up report.  Patient is completely asymptomatic.  No shortness of breath or cough.  Airborne isolation discontinued and placed on droplet precautions alone.  Hypertension: Lotensin, Lasix held due to worsening renal function.  Continue PRN meds for now.  Reasonable control for now.  AKI on CKD stage III:  Baseline creatinine ranges from 2-2.3.  Presented with mild acute kidney injury.  Creatinine improved to 1.7 post IV hydration.  Creatinine back to baseline.  Discontinued IV fluids.  Type 2 diabetes mellitus/hypoglycemia:  Hemoglobin A1c of 6.9 on 11/19.  On insulin pump/U500 which was still ongoing this morning in addition to Lantus 5 units and SSI.  Had hypoglycemia/CBG of 54 this morning likely due to above insulins.   Discussed in detail with diabetes coordinator and follow recommendations: DC insulin pump/U5 100, starting Lantus 40 units daily, SSI and mealtime NovoLog.  Monitor closely.  Hyperlipidemia:  Continue home meds  Normocytic anemia:  Chronic normocytic anemia most likely from the chronic comorbidities.  Possibly anemia of chronic disease.  Hemoglobin stable in the 8-9 range.  History of DVT:  Currently on Coumadin.  INR 3.1, hold Coumadin in anticipation for surgery in 48 hours.  Resume postop Coumadin when okay with orthopedics.  Hypothyroidism:  Continue Synthyroid  Sleep apnea:  Uses CPAP at night, currently not getting due to Covid-19 rule out.     PPE: Patient: None.  Provider: Head cover,CAPR, gown,  gloves and shoe cover DVT prophylaxis: Anticoagulated on Coumadin Code Status: Full code Family Communication: None present at the bedside Disposition Plan: To be determined pending surgery and clinical improvement.  He  presented from SNF.?  Return to SNF.   Consultants:  Orthopedics  Procedures:  None  Antimicrobials:  Anti-infectives (From admission, onward)   Start     Dose/Rate Route Frequency Ordered Stop   10/24/18 2100  vancomycin (VANCOCIN) 1,250 mg in sodium chloride 0.9 % 250 mL IVPB     1,250 mg 166.7 mL/hr over 90 Minutes Intravenous Every 24 hours 10/24/18 0936     10/24/18 0800  vancomycin (VANCOCIN) 1,500 mg in sodium chloride 0.9 % 500 mL IVPB  Status:  Discontinued     1,500 mg 250 mL/hr over 120 Minutes Intravenous Every 36 hours 10/22/18 1917 10/23/18 0919   10/23/18 2100  vancomycin (VANCOCIN) 1,000 mg in sodium chloride 0.9 % 250 mL IVPB  Status:  Discontinued     1,000 mg 250 mL/hr over 60 Minutes Intravenous Every 24 hours 10/23/18 0919 10/24/18 0936   10/23/18 0000  cefTRIAXone (ROCEPHIN) 2 g in sodium chloride 0.9 % 100 mL IVPB     2 g 200 mL/hr over 30 Minutes Intravenous Every 24 hours 10/22/18 2345     10/22/18 1930  ceFEPIme (MAXIPIME) 2 g in sodium chloride 0.9 % 100 mL IVPB  Status:  Discontinued     2 g 200 mL/hr over 30 Minutes Intravenous Every 24 hours 10/22/18 1917 10/22/18 2345   10/22/18 1930  vancomycin (VANCOCIN) 2,500 mg in sodium chloride 0.9 % 500 mL IVPB     2,500 mg 250 mL/hr over 120 Minutes Intravenous  Once 10/22/18 1917 10/23/18 1905      Subjective: Patient reports that he was [facility] reports pain at right lower extremity stump site ranging between 3/10- 10/10.  Denies dyspnea, cough or fever.  No other complaints reported.  Objective: Vitals:   10/23/18 1030 10/23/18 1649 10/23/18 2300 10/24/18 0742  BP: (!) 142/43 (!) 108/37 (!) 112/52 (!) 139/50  Pulse: 91 88 90 (!) 102  Resp: 18 19 20    Temp: 99.1 F (37.3 C) 98.9 F (37.2 C) 99 F (37.2 C) 98.9 F (37.2 C)  TempSrc: Oral Oral Oral Oral  SpO2: 99% 95% 97% 98%  Weight:      Height:        Intake/Output Summary (Last 24 hours) at 10/24/2018 1452 Last  data filed at 10/24/2018 1200 Gross per 24 hour  Intake 1752.1 ml  Output 2800 ml  Net -1047.9 ml   Filed Weights   10/22/18 1627 10/22/18 2350  Weight: (!) 140.2 kg 128.7 kg    Examination:  General exam: Pleasant young male, moderately built and morbidly obese lying comfortably propped up in bed.  Oral mucosa moist. Respiratory system: Clear to auscultation.  No increased work of breathing. Cardiovascular system: S1 & S2 heard, RRR. No JVD, murmurs, rubs, gallops or clicks. No pedal edema. Gastrointestinal system: Abdomen is nondistended, soft and nontender. No organomegaly or masses felt. Normal bowel sounds heard. Central nervous system: Alert and oriented. No focal neurological deficits. Extremities: Right BKA stump dressing mildly soiled, no odor, RN getting ready to change dressing later this morning.  Symmetric 5 x 5 power. Skin: No rashes, lesions or ulcers,no icterus ,no pallor   Data Reviewed: I have personally reviewed following labs and imaging studies  CBC: Recent Labs  Lab 10/22/18 1652 10/23/18 0323 10/24/18  0523  WBC 18.9* 18.2* 11.2*  NEUTROABS 14.9*  --  8.7*  HGB 8.9* 8.1* 9.1*  HCT 30.2* 25.6* 30.1*  MCV 87.3 85.3 88.8  PLT 680* 582* 027*   Basic Metabolic Panel: Recent Labs  Lab 10/22/18 1652 10/23/18 0323 10/24/18 0523  NA 136 138 139  K 4.7 4.2 4.3  CL 98 103 105  CO2 28 28 21*  GLUCOSE 159* 97 53*  BUN 81* 69* 54*  CREATININE 2.74* 2.26* 1.78*  CALCIUM 9.1 8.3* 8.6*   GFR: Estimated Creatinine Clearance: 60.8 mL/min (A) (by C-G formula based on SCr of 1.78 mg/dL (H)). Liver Function Tests: Recent Labs  Lab 10/22/18 1652  AST 41  ALT 29  ALKPHOS 108  BILITOT 0.7  PROT 7.8  ALBUMIN 2.4*   Coagulation Profile: Recent Labs  Lab 10/22/18 2057 10/23/18 0323 10/24/18 0948  INR 3.0* 3.2* 3.1*   CBG: Recent Labs  Lab 10/23/18 1204 10/23/18 1646 10/23/18 2226 10/24/18 0740 10/24/18 1255  GLUCAP 64* 128* 143* 54* 154*      Recent Results (from the past 240 hour(s))  Blood culture (routine x 2)     Status: None (Preliminary result)   Collection Time: 10/22/18  5:00 PM  Result Value Ref Range Status   Specimen Description BLOOD RIGHT ANTECUBITAL  Final   Special Requests   Final    BOTTLES DRAWN AEROBIC AND ANAEROBIC Blood Culture results may not be optimal due to an excessive volume of blood received in culture bottles   Culture   Final    NO GROWTH 2 DAYS Performed at [facility] [address], [place], [place] [address]    Report Status PENDING  Incomplete  Blood culture (routine x 2)     Status: None (Preliminary result)   Collection Time: 10/22/18  5:10 PM  Result Value Ref Range Status   Specimen Description BLOOD LEFT ANTECUBITAL  Final   Special Requests   Final    BOTTLES DRAWN AEROBIC AND ANAEROBIC Blood Culture results may not be optimal due to an excessive volume of blood received in culture bottles   Culture   Final    NO GROWTH 2 DAYS Performed at [facility], [place] [address] [place], [place] [address]    Report Status PENDING  Incomplete         Radiology Studies: Dg Femur Portable Min 2 Views Right  Result Date: 10/22/2018 CLINICAL DATA:  Right leg pain EXAM: RIGHT FEMUR PORTABLE 2 VIEW COMPARISON:  None. FINDINGS: There is no evidence of fracture or other focal bone lesions. Soft tissues are unremarkable. Right BKA with skin staples. Expected postoperative appearance. IMPRESSION: Expected postoperative appearance of right BKA.  No acute findings. Electronically Signed   By: Ulyses Jarred M.D.   On: 10/22/2018 19:15        Scheduled Meds: . aspirin EC  81 mg Oral Daily  . atorvastatin  80 mg Oral Daily  . DULoxetine  60 mg Oral Daily  . feeding supplement (PRO-STAT SUGAR FREE 64)  30 mL Oral TID  . fenofibrate  160 mg Oral Daily  . FLUoxetine  20 mg Oral Daily  . gabapentin  300 mg Oral BID  . insulin aspart  0-9 Units Subcutaneous TID WC  . levothyroxine   100 mcg Oral Daily  . midodrine  5 mg Oral TID WC  . multivitamin with minerals  1 tablet Oral Daily  . tamsulosin  0.4 mg Oral Daily  .  tiZANidine  4 mg Oral BID  . vitamin C  500 mg Oral BID  . zinc sulfate  220 mg Oral Daily   Continuous Infusions: . sodium chloride 75 mL/hr at 10/24/18 1307  . cefTRIAXone (ROCEPHIN)  IV 2 g (10/24/18 0151)  . vancomycin       LOS: 2 days     Vernell Leep, MD, FACP, [facility]. [facility]  To contact the attending provider between 7A-7P or the covering provider during after hours 7P-7A, please log into the web site [email or web address] and access using universal [place] password for that web site. If you do not have the password, please call the hospital operator.

## 2018-10-24 NOTE — Progress Notes (Addendum)
Inpatient Diabetes Program Recommendations  AACE/ADA: New Consensus Statement on Inpatient Glycemic Control (2015)  Target Ranges:  Prepandial:   less than 140 mg/dL      Peak postprandial:   less than 180 mg/dL (1-2 hours)      Critically ill patients:  140 - 180 mg/dL   Results for Ethan Campbell, Ethan Campbell (MRN [id number]) as of 10/24/2018 10:40  Ref. Range 10/23/2018 08:07 10/23/2018 12:04 10/23/2018 16:46 10/23/2018 22:26 10/24/2018 07:40  Glucose-Capillary Latest Ref Range: 70 - 99 mg/dL 79 64 (L) 128 (H) 143 (H) 54 (L)   Review of Glycemic Control  Diabetes history: DM 2 Outpatient Diabetes medications:  Omnipod insulin pump with humulin R U-500 insulin, Victoza 1.8 mg Daily (follows with Dr. Dwyane Dee, Endocrinologist)  Basal rates  12A      0.4 units/hour 12P      0.9 units/hour 9P        0.75 units/hour  Current orders for Inpatient glycemic control: Novolog 0-9 units tid  Inpatient Diabetes Program Recommendations:    Patient currently has insulin pump on and infusing basal rate. Patient had hypoglycemia in the 50's this am related to Lantus 5 units BID received the day before. Per patient report he was treated with 2 orange juices, 2 peanut butters, and 2 packs of crackers. Patient consumed all of the orange juice and some of the crackers and peanut butter. He has also had his breakfast tray.  Patient reports his insulin pump is due to be changed tomorrow, however he does not have supplies or the control device to be able to operate pump. Patient will need another method of insulin delivery while here.  Recommend  -  Lantus 40 units to be given around lunchtime -  Novolog Moderate Correction 0-15 units tid + hs scale -  Novolog 6 units tid meal coverage if patient consumes at least 50% of meals.  Paged Dr. Algis Liming with plan of care.  Thanks,  Tama Headings RN, MSN, BC-ADM Inpatient Diabetes Coordinator Team Pager [phone] (8a-5p)

## 2018-10-24 NOTE — Progress Notes (Signed)
Inpatient Diabetes Program Recommendations  AACE/ADA: New Consensus Statement on Inpatient Glycemic Control (2015)  Target Ranges:  Prepandial:   less than 140 mg/dL      Peak postprandial:   less than 180 mg/dL (1-2 hours)      Critically ill patients:  140 - 180 mg/dL   Spoke with Dr. Algis Liming about patient's plan of care.  RN to get patient to discontinue patient's insulin pump at 6pm today 4/1.  Patient to received Lantus 40 units at hs tonight 4/1.  Patient to receive Novolog 0-15 units and Novolog 0-5 for correction starting at supper 4/1.  Patient also to receive Novolog 6 units tid meal coverage if consuming at least 50% of meals and glucose is at least 80 mg/dl. Starting tomorrow 4/2.  Thanks,  Tama Headings RN, MSN, BC-ADM Inpatient Diabetes Coordinator Team Pager [phone] (8a-5p)

## 2018-10-24 NOTE — Progress Notes (Signed)
Pharmacy Antibiotic Note  Ethan Campbell is a 58 y.o. male admitted on 10/22/2018 with wound infection.  Pharmacy has been consulted for vancomycin dosing. Also on ceftriaxone 2g IV q24h per MD. Pt had a recent right BKA on 10/04/18 here. WBC trend down, afebrile. Plan for AKA 4/3. Noted SCr improvement 2.74 > 1.78.  Plan: -Adjust vancomycin to 1250 mg IV Q24 hrs due to improving renal function. Goal AUC 400-550. Expected AUC: 489 SCr used: 1.78 -Monitor CBC, renal fx, cultures and clinical progress -Vanc trough/peak as needed    Height: 5\' 9"  (175.3 cm) Weight: 283 lb 11.7 oz (128.7 kg) IBW/kg (Calculated) : 70.7  Temp (24hrs), Avg:99 F (37.2 C), Min:98.9 F (37.2 C), Max:99.1 F (37.3 C)  Recent Labs  Lab 10/22/18 1652 10/22/18 2057 10/22/18 2337 10/23/18 0323 10/24/18 0523  WBC 18.9*  --   --  18.2* 11.2*  CREATININE 2.74*  --   --  2.26* 1.78*  LATICACIDVEN  --  1.1 1.2  --   --     Estimated Creatinine Clearance: 60.8 mL/min (A) (by C-G formula based on SCr of 1.78 mg/dL (H)).    No Known Allergies  Elicia Lamp, PharmD, BCPS Please check AMION for all [place] contact numbers Clinical Pharmacist 10/24/2018 9:36 AM

## 2018-10-24 NOTE — Progress Notes (Signed)
Patient is being ruled our for COVID-19 CPAP not indicated at this time.

## 2018-10-25 ENCOUNTER — Ambulatory Visit (INDEPENDENT_AMBULATORY_CARE_PROVIDER_SITE_OTHER): Payer: Self-pay | Admitting: Physician Assistant

## 2018-10-25 LAB — BASIC METABOLIC PANEL
Anion gap: 5 (ref 5–15)
BUN: 47 mg/dL — ABNORMAL HIGH (ref 6–20)
CO2: 26 mmol/L (ref 22–32)
Calcium: 8.1 mg/dL — ABNORMAL LOW (ref 8.9–10.3)
Chloride: 102 mmol/L (ref 98–111)
Creatinine, Ser: 1.69 mg/dL — ABNORMAL HIGH (ref 0.61–1.24)
GFR calc Af Amer: 51 mL/min — ABNORMAL LOW (ref >60)
GFR calc non Af Amer: 44 mL/min — ABNORMAL LOW (ref >60)
Glucose, Bld: 222 mg/dL — ABNORMAL HIGH (ref 70–99)
Potassium: 4.2 mmol/L (ref 3.5–5.1)
Sodium: 133 mmol/L — ABNORMAL LOW (ref 135–145)

## 2018-10-25 LAB — CBC
HCT: 25.7 % — ABNORMAL LOW (ref 39.0–52.0)
Hemoglobin: 8 g/dL — ABNORMAL LOW (ref 13.0–17.0)
MCH: 26.6 pg (ref 26.0–34.0)
MCHC: 31.1 g/dL (ref 30.0–36.0)
MCV: 85.4 fL (ref 80.0–100.0)
Platelets: 558 10*3/uL — ABNORMAL HIGH (ref 150–400)
RBC: 3.01 MIL/uL — ABNORMAL LOW (ref 4.22–5.81)
RDW: 15 % (ref 11.5–15.5)
WBC: 11.1 10*3/uL — ABNORMAL HIGH (ref 4.0–10.5)
nRBC: 0 % (ref 0.0–0.2)

## 2018-10-25 LAB — PROTIME-INR
INR: 2.2 — ABNORMAL HIGH (ref 0.8–1.2)
Prothrombin Time: 24.4 seconds — ABNORMAL HIGH (ref 11.4–15.2)

## 2018-10-25 LAB — GLUCOSE, CAPILLARY
Glucose-Capillary: 220 mg/dL — ABNORMAL HIGH (ref 70–99)
Glucose-Capillary: 209 mg/dL — ABNORMAL HIGH (ref 70–99)
Glucose-Capillary: 245 mg/dL — ABNORMAL HIGH (ref 70–99)
Glucose-Capillary: 215 mg/dL — ABNORMAL HIGH (ref 70–99)
Glucose-Capillary: 204 mg/dL — ABNORMAL HIGH (ref 70–99)

## 2018-10-25 MED ORDER — CEFAZOLIN SODIUM-DEXTROSE 2-4 GM/100ML-% IV SOLN
2.0000 g | INTRAVENOUS | Status: AC
  Administered 2018-10-26: 2 g via INTRAVENOUS
  Filled 2018-10-25: qty 100

## 2018-10-25 MED ORDER — CHLORHEXIDINE GLUCONATE 4 % EX LIQD
60.0000 mL | Freq: Once | CUTANEOUS | Status: DC
  Filled 2018-10-25: qty 60

## 2018-10-25 NOTE — Telephone Encounter (Signed)
Gave Dr. Sharol Given  Debra's phone number and he will call her after surgery tomorrow

## 2018-10-25 NOTE — Progress Notes (Signed)
[facility] for Heparin (warfarin on hold) Indication: History of VTE  No Known Allergies  Patient Measurements: Height: 5\' 9"  (175.3 cm) Weight: 283 lb 11.7 oz (128.7 kg) IBW/kg (Calculated) : 70.7  Heparin dosing weight - 103.9 kg  Vital Signs: Temp: 99.6 F (37.6 C) (04/02 0844) Temp Source: Oral (04/02 0844) BP: 130/71 (04/02 0844) Pulse Rate: 140 (04/02 0844)  Labs: Recent Labs    10/22/18 2057 10/23/18 0323 10/24/18 0523 10/24/18 0948 10/25/18 0721  HGB  --  8.1* 9.1*  --  8.0*  HCT  --  25.6* 30.1*  --  25.7*  PLT  --  582* 472*  --  558*  APTT 67*  --   --   --   --   LABPROT 30.4* 32.0*  --  31.7* 24.4*  INR 3.0* 3.2*  --  3.1* 2.2*  CREATININE  --  2.26* 1.78*  --  1.69*    Estimated Creatinine Clearance: 64.1 mL/min (A) (by C-G formula based on SCr of 1.69 mg/dL (H)).   Medical History: Past Medical History:  Diagnosis Date  . Arm vein blood clot, left    on Coumadin  . Arthritis   . Cancer ([place])    Bone cancer 1987 (in knee Large cell tumor)  . Chronic kidney disease    stage 3  . Depression   . Essential hypertension   . History of stroke   . Hyperlipidemia   . Hypothyroidism   . Insomnia   . Obesity   . Precordial pain June 2011   Nuclear stress; no ischemia; EF 60%  . Presence of permanent cardiac pacemaker   . Proteinuria   . Seizure disorder ([place])   . Sick sinus syndrome ([place])   . Sleep apnea    Dr. Brandon Melnick, uses bipap  . Syncope   . Type 2 diabetes mellitus ([place])   . Venous insufficiency     Assessment: 58 y/o M here from nursing facility for wound infection, holding warfarin for hx VTE and starting heparin in case surgical procedure is needed on wound. INR down to 2.2 this AM. CBC stable. No active bleed issues documented.  Goal of Therapy:  Heparin level 0.3-0.7 units/ml Monitor platelets by anticoagulation protocol: Yes   Plan:  Spoke with Dr. Sharol Given - hold heparin and warfarin until  after surgery Will f/u anticoagulation plans post-op  Elicia Lamp, PharmD, BCPS Please check AMION for all [place] contact numbers Clinical Pharmacist 10/25/2018 9:04 AM

## 2018-10-25 NOTE — Progress Notes (Signed)
PROGRESS NOTE    DAIVD FREDERICKSEN  AJO:[id number] DOB: 12-26-1960 DOA: 10/22/2018 PCP: Arsenio Katz, NP   Brief Narrative: Patient is a 58 year old male with history of hypertension, hyperlipidemia, diabetes type 2 on insulin pump, hypothyroidism, depression, left arm DVT on Coumadin, bone cancer, OSA on CPAP, CKD stage III who presented with complaints of right leg wound infection. Patient just had BKA of right lower extremity by orthopedics on 10/04/2018.  Wound VAC was taken down 6 days PTA.  He was also having fever of 103 F and chills at skilled nursing facility.  Patient stated that he had contact with Covid-19 positive health worker at the facility.  Test for COVID-19 sent & pending.  Patient is completely asymptomatic. Orthopedics planning for right AKA on Friday 4/3 at 7:30 AM.  Dr. Sharol Given following.  Assessment & Plan:   Principal Problem:   Wound infection after surgery Active Problems:   Essential hypertension, benign   Acute renal failure superimposed on stage 3 chronic kidney disease (HCC)   HLD (hyperlipidemia)   Below-knee amputation of right lower extremity (HCC)   Uncontrolled type 2 diabetes mellitus with polyneuropathy (HCC)   Normocytic anemia   Sepsis (HCC)   DVT (deep venous thrombosis) ([place])   Hypothyroidism   Close Exposure to Covid-19 Virus   Dehiscence of amputation stump (HCC)   Severe protein-calorie malnutrition ([place])   Suspected sepsis secondary to postop wound infection of the right lower extremity:  History of recent BKA.   Right lower extremity wound appears dark, mottled.   Presented with fever, tachycardia, leukocytosis.  Hemodynamically stable.   Failed outpatient doxycycline treatment. Started on empiric antibiotics, IV ceftriaxone and vancomycin.  Blood cultures x2: Negative to date. Sepsis physiology resolved, defervesced, leukocytosis has improved Definitive treatment/plan for right AKA 4/3.  Right lower extremity amputation site  infection:  Amputation site is dark, mottled.  Staples still there.   Orthopedics, Dr. Sharol Given, following and planning for right AKA on Friday 4/3. Continue antibiotics. I discussed with Dr. Sharol Given.  Suspicion for Covid-19:  Patient stated that he had contact with nursing staff with COVID-19.  Test has been sent.  We will follow-up report.  Patient is completely asymptomatic.  No shortness of breath or cough.   Was on airborne isolation.  Currently on droplet isolation and contact precautions. Requested RN to have social work follow-up on results from SNF.  Essential hypertension: Lotensin, Lasix held due to worsening renal function.  Continue PRN meds for now.  Reasonable control for now.  Also on midodrine, suspect due to orthostatic hypotension.  AKI on CKD stage III:  Baseline creatinine ranges from 2-2.3.  Presented with mild acute kidney injury.  Creatinine improved to 1.7 post IV hydration.  Creatinine back to baseline.  Discontinued IV fluids.  Creatinine down to 1.69.  Type 2 diabetes mellitus/hypoglycemia:  Hemoglobin A1c of 6.9 on 11/19.  Patient had a mild hypoglycemic episode with CBG of 54 on 4/1 while he was still on U5 100 insulin pump, Lantus 5 units daily and NovoLog SSI. Discussed in detail with diabetes coordinator 4/1 and as per their recommendations, DC'ed insulin pump/U5 100, started Lantus 40 units daily, SSI and mealtime NovoLog.  CBGs mildly uncontrolled in the low 200s.  Monitor closely and adjust insulins as needed.  Hyperlipidemia:  Continue home meds  Normocytic anemia:  Chronic normocytic anemia most likely from the chronic comorbidities.  Possibly anemia of chronic disease.  Hemoglobin stable in the 8-9 range.  History of DVT:  Currently on Coumadin.  INR down to 2.2, hold Coumadin in anticipation for surgery in 48 hours.  Resume postop Coumadin when okay with orthopedics. As per patient, he had a solitary episode of left upper extremity DVT approximately 1  year ago.  He is unsure as to why he is on long-term anticoagulation.  He denies history of recurrent DVT, PE or being told of any hypercoagulable status. As per review of cardiology follow-up in care everywhere, history of run of atrial tachycardia versus atrial flutter noted in March 2019.  As per cardiology follow-up 02/21/2018, he was advised to continue warfarin for stroke prophylaxis.  Atrial tachycardia Management as noted above.  On anticoagulation for this indication.  Follows [facility] at [facility].  Hypothyroidism:  Continue Synthyroid  OSA: Uses CPAP at night, currently not getting due to Covid-19 rule out.  Morbid obesity/Body mass index is 41.9 kg/m.   Chronic diastolic CHF Compensated.  Currently off of diuretics.  History of cardiogenic syncope/sick sinus syndrome As per nephrology note in care everywhere.  He is status post pacemaker [facility]/14/2017 at [facility].  Seizure disorder Patient reports that he is not on medications for this.  Claims that his last episode was prior to last surgery in early March 2020.  Does not drive.  History of orthostatic hypotension Remains on midodrine.  Followed at [facility].      PPE: Patient: None.  Provider: Head cover,CAPR, gown, gloves and shoe cover.  Currently on droplet and contact precautions. DVT prophylaxis: Anticoagulated on Coumadin Code Status: Full code Family Communication: None present at the bedside Disposition Plan: To be determined pending surgery and clinical improvement.  He presented from SNF.?  Return to SNF.   Consultants:  Orthopedics  Procedures:  None  Antimicrobials:  Anti-infectives (From admission, onward)   Start     Dose/Rate Route Frequency Ordered Stop   10/24/18 2100  vancomycin (VANCOCIN) 1,250 mg in sodium chloride 0.9 % 250 mL IVPB     1,250 mg 166.7 mL/hr over 90 Minutes Intravenous Every 24 hours 10/24/18 0936     10/24/18 0800  vancomycin (VANCOCIN)  1,500 mg in sodium chloride 0.9 % 500 mL IVPB  Status:  Discontinued     1,500 mg 250 mL/hr over 120 Minutes Intravenous Every 36 hours 10/22/18 1917 10/23/18 0919   10/23/18 2100  vancomycin (VANCOCIN) 1,000 mg in sodium chloride 0.9 % 250 mL IVPB  Status:  Discontinued     1,000 mg 250 mL/hr over 60 Minutes Intravenous Every 24 hours 10/23/18 0919 10/24/18 0936   10/23/18 0000  cefTRIAXone (ROCEPHIN) 2 g in sodium chloride 0.9 % 100 mL IVPB     2 g 200 mL/hr over 30 Minutes Intravenous Every 24 hours 10/22/18 2345     10/22/18 1930  ceFEPIme (MAXIPIME) 2 g in sodium chloride 0.9 % 100 mL IVPB  Status:  Discontinued     2 g 200 mL/hr over 30 Minutes Intravenous Every 24 hours 10/22/18 1917 10/22/18 2345   10/22/18 1930  vancomycin (VANCOCIN) 2,500 mg in sodium chloride 0.9 % 500 mL IVPB     2,500 mg 250 mL/hr over 120 Minutes Intravenous  Once 10/22/18 1917 10/23/18 1905      Subjective: Patient denies complaints.  No pain reported.  No dyspnea, cough.  Objective: Vitals:   10/24/18 0742 10/24/18 1732 10/24/18 2320 10/25/18 0844  BP: (!) 139/50 (!) 148/54 (!) 142/58 130/71  Pulse: (!) 102 88 82 (!) 140  Resp:  19  Temp: 98.9 F (37.2 C) 97.6 F (36.4 C) 98.2 F (36.8 C) 99.6 F (37.6 C)  TempSrc: Oral Oral Oral Oral  SpO2: 98% 99% 97% 100%  Weight:      Height:        Intake/Output Summary (Last 24 hours) at 10/25/2018 1210 Last data filed at 10/25/2018 5732 Gross per 24 hour  Intake 240 ml  Output 2050 ml  Net -1810 ml   Filed Weights   10/22/18 1627 10/22/18 2350  Weight: (!) 140.2 kg 128.7 kg    Examination:  General exam: Pleasant young male, moderately built and morbidly obese lying comfortably propped up in bed.  Oral mucosa moist.  Does not appear in any distress. Respiratory system: Clear to auscultation.  No increased work of breathing.  Stable. Cardiovascular system: S1 & S2 heard, RRR. No JVD, murmurs, rubs, gallops or clicks. No pedal edema.   Stable. Gastrointestinal system: Abdomen is nondistended, soft and nontender. No organomegaly or masses felt. Normal bowel sounds heard. Central nervous system: Alert and oriented. No focal neurological deficits. Extremities: Right BKA stump dressing clean and dry, was just changed by RN.  Symmetric 5 x 5 power. Skin: No rashes, lesions or ulcers,no icterus ,no pallor   Data Reviewed: I have personally reviewed following labs and imaging studies  CBC: Recent Labs  Lab 10/22/18 1652 10/23/18 0323 10/24/18 0523 10/25/18 0721  WBC 18.9* 18.2* 11.2* 11.1*  NEUTROABS 14.9*  --  8.7*  --   HGB 8.9* 8.1* 9.1* 8.0*  HCT 30.2* 25.6* 30.1* 25.7*  MCV 87.3 85.3 88.8 85.4  PLT 680* 582* 472* 202*   Basic Metabolic Panel: Recent Labs  Lab 10/22/18 1652 10/23/18 0323 10/24/18 0523 10/25/18 0721  NA 136 138 139 133*  K 4.7 4.2 4.3 4.2  CL 98 103 105 102  CO2 28 28 21* 26  GLUCOSE 159* 97 53* 222*  BUN 81* 69* 54* 47*  CREATININE 2.74* 2.26* 1.78* 1.69*  CALCIUM 9.1 8.3* 8.6* 8.1*   GFR: Estimated Creatinine Clearance: 64.1 mL/min (A) (by C-G formula based on SCr of 1.69 mg/dL (H)). Liver Function Tests: Recent Labs  Lab 10/22/18 1652  AST 41  ALT 29  ALKPHOS 108  BILITOT 0.7  PROT 7.8  ALBUMIN 2.4*   Coagulation Profile: Recent Labs  Lab 10/22/18 2057 10/23/18 0323 10/24/18 0948 10/25/18 0721  INR 3.0* 3.2* 3.1* 2.2*   CBG: Recent Labs  Lab 10/24/18 0740 10/24/18 1255 10/24/18 1730 10/24/18 2218 10/25/18 0618  GLUCAP 54* 154* 220* 231* 209*     Recent Results (from the past 240 hour(s))  Blood culture (routine x 2)     Status: None (Preliminary result)   Collection Time: 10/22/18  5:00 PM  Result Value Ref Range Status   Specimen Description BLOOD RIGHT ANTECUBITAL  Final   Special Requests   Final    BOTTLES DRAWN AEROBIC AND ANAEROBIC Blood Culture results may not be optimal due to an excessive volume of blood received in culture bottles   Culture    Final    NO GROWTH 3 DAYS Performed at [facility], [place] [address] [place], [place] [address]    Report Status PENDING  Incomplete  Blood culture (routine x 2)     Status: None (Preliminary result)   Collection Time: 10/22/18  5:10 PM  Result Value Ref Range Status   Specimen Description BLOOD LEFT ANTECUBITAL  Final   Special Requests   Final  BOTTLES DRAWN AEROBIC AND ANAEROBIC Blood Culture results may not be optimal due to an excessive volume of blood received in culture bottles   Culture   Final    NO GROWTH 3 DAYS Performed at [facility], [place] [address], [place], [place] [address]    Report Status PENDING  Incomplete         Radiology Studies: No results found.      Scheduled Meds:  aspirin EC  81 mg Oral Daily   atorvastatin  80 mg Oral Daily   DULoxetine  60 mg Oral Daily   feeding supplement (PRO-STAT SUGAR FREE 64)  30 mL Oral TID   fenofibrate  160 mg Oral Daily   FLUoxetine  20 mg Oral Daily   gabapentin  300 mg Oral BID   insulin aspart  0-15 Units Subcutaneous TID WC   insulin aspart  0-5 Units Subcutaneous QHS   insulin aspart  6 Units Subcutaneous TID WC   insulin glargine  40 Units Subcutaneous QHS   levothyroxine  100 mcg Oral Daily   midodrine  5 mg Oral TID WC   multivitamin with minerals  1 tablet Oral Daily   tamsulosin  0.4 mg Oral Daily   tiZANidine  4 mg Oral BID   vitamin C  500 mg Oral BID   zinc sulfate  220 mg Oral Daily   Continuous Infusions:  cefTRIAXone (ROCEPHIN)  IV 2 g (10/24/18 2350)   vancomycin 1,250 mg (10/24/18 2221)     LOS: 3 days     Vernell Leep, MD, FACP, [facility]. [facility]  To contact the attending provider between 7A-7P or the covering provider during after hours 7P-7A, please log into the web site [email or web address] and access using universal [place] password for that web site. If you do not have the password, please call the hospital operator.

## 2018-10-25 NOTE — TOC Initial Note (Signed)
Transition of Care [facility]) - Initial/Assessment Note    Patient Details  Name: ROSA GAMBALE MRN: [id number] Date of Birth: 1961/01/12  Transition of Care [facility]) CM/SW Contact:    Eileen Stanford, LCSW Phone Number: 10/25/2018, 12:45 PM  Clinical Narrative:    Pt is alert and oriented. Pt came from [facility] in [place] where he was there for short term. Pt would prefer to go home if able to however is agreeable to [facility] in [place] if needed. CSW to follow up with facility. Pt has a pending COVID-19 test.  Readmission risk screening completed. NO additional resources needed at this time.     Expected Discharge Plan: [place] Barriers to Discharge: (pending COVID-19 test)   Patient Goals and CMS Choice Patient states their goals for this hospitalization and ongoing recovery are:: pt wants to go home if able, but agreeable to [facility] in [place] if needed   Choice offered to / list presented to : Patient  Expected Discharge Plan and Services Expected Discharge Plan: [place] In-house Referral: (None at this time)   Post Acute Care Choice: [place] arrangements for the past 2 months: [place]                          Prior Living Arrangements/Services Living arrangements for the past 2 months: [place] Lives with:: Facility Resident Patient language and need for interpreter reviewed:: No Do you feel safe going back to the place where you live?: Yes      Need for Family Participation in Patient Care: No (Comment) Care giver support system in place?: Yes (comment)   Criminal Activity/Legal Involvement Pertinent to Current Situation/Hospitalization: No - Comment as needed  Activities of Daily Living Home Assistive Devices/Equipment: Cane (specify quad or straight), Walker (specify type), CBG Meter, Raised toilet seat with rails, Shower chair without back ADL Screening (condition at time  of admission) Patient's cognitive ability adequate to safely complete daily activities?: Yes Is the patient deaf or have difficulty hearing?: No Does the patient have difficulty seeing, even when wearing glasses/contacts?: No Does the patient have difficulty concentrating, remembering, or making decisions?: No Patient able to express need for assistance with ADLs?: Yes Does the patient have difficulty dressing or bathing?: No Independently performs ADLs?: Yes (appropriate for developmental age) Does the patient have difficulty walking or climbing stairs?: Yes Weakness of Legs: Both Weakness of Arms/Hands: None  Permission Sought/Granted Permission sought to share information with : Family Supports, Chartered certified accountant granted to share information with : Yes, Release of Information Signed  Share Information with NAME: Hilda Blades  Permission granted to share info w AGENCY: [place] granted to share info w Relationship: spouse     Emotional Assessment Appearance:: Appears stated age Attitude/Demeanor/Rapport: (pt was appropriate) Affect (typically observed): Accepting, Appropriate, Calm Orientation: : Oriented to Self, Oriented to Place, Oriented to  Time, Oriented to Situation Alcohol / Substance Use: Not Applicable Psych Involvement: No (comment)  Admission diagnosis:  Wound infection [T14.8XXA, L08.9] Patient Active Problem List   Diagnosis Date Noted  . Dehiscence of amputation stump ([place])   . Severe protein-calorie malnutrition ([place])   . Uncontrolled type 2 diabetes mellitus with polyneuropathy ([place]) 10/22/2018  . Normocytic anemia 10/22/2018  . Sepsis ([place]) 10/22/2018  . Wound infection after surgery 10/22/2018  . DVT (deep venous thrombosis) ([place]) 10/22/2018  . Hypothyroidism 10/22/2018  .  Close Exposure to Covid-19 Virus 10/22/2018  . Below-knee amputation of right lower extremity ([place]) 10/05/2018  . Gangrene of right foot ([place])    . Amputated toe, right ([place]) 09/26/2018  . Subacute osteomyelitis, right ankle and foot ([place])   . Osteomyelitis of great toe of right foot ([place]) 06/19/2018  . Diabetic polyneuropathy associated with type 2 diabetes mellitus ([place]) 06/19/2018  . Body mass index 45.0-49.9, adult ([place]) 06/19/2018  . Seizure disorder ([place]) 08/27/2015  . Somnolence, daytime 08/27/2015  . Orthostatic hypotension 11/27/2014  . HLD (hyperlipidemia) 11/27/2014  . Syncope and collapse 08/20/2014  . Diabetic autonomic neuropathy associated with type 2 diabetes mellitus ([place]) 05/01/2014  . Dizzy spells 03/17/2014  . Acute renal failure superimposed on stage 3 chronic kidney disease ([place]) 08/22/2013  . Type II diabetes mellitus, uncontrolled ([place]) 12/03/2009  . HYPERLIPIDEMIA 12/03/2009  . Morbid obesity ([place]) 12/03/2009  . OBSTRUCTIVE SLEEP APNEA 12/03/2009  . Essential hypertension, benign 12/03/2009  . EDEMA 12/03/2009  . PRECORDIAL PAIN 12/03/2009  . PROTEINURIA 12/03/2009   PCP:  Arsenio Katz, NP Pharmacy:  No Pharmacies Listed    Social Determinants of Health (SDOH) Interventions    Readmission Risk Interventions Readmission Risk Prevention Plan 10/25/2018  Transportation Screening Complete  PCP or Specialist Appt within 3-5 Days Complete  HRI or [place] Complete  HRI or Home Care Consult comments N/A  Social Work Consult for [place] Planning/Counseling Complete  Palliative Care Screening Not Applicable  Medication Review Press photographer) Complete  Some recent data might be hidden

## 2018-10-25 NOTE — Progress Notes (Signed)
Patient is being ruled our for COVID-19 CPAP not indicated at this time.

## 2018-10-25 NOTE — [place] FL2 (Signed)
[place] MEDICAID FL2 LEVEL OF CARE SCREENING TOOL     IDENTIFICATION  Patient Name: Ethan Campbell Birthdate: 1961-06-29 Sex: male Admission Date (Current Location): 10/22/2018  [facility] and [place] Number:  Herbalist and Address:  [facility] [place], [place] [address] [place], [place] [address]      Provider Number: [id number]  Attending Physician Name and Address:  Modena Jansky, MD  Relative Name and Phone Number:  Hilda Blades (spouse) [phone]    Current Level of Care: Hospital Recommended Level of Care: [place] Prior Approval Number:    Date Approved/Denied:   PASRR Number:    Discharge Plan: SNF    Current Diagnoses: Patient Active Problem List   Diagnosis Date Noted  . Dehiscence of amputation stump ([place])   . Severe protein-calorie malnutrition ([place])   . Uncontrolled type 2 diabetes mellitus with polyneuropathy ([place]) 10/22/2018  . Normocytic anemia 10/22/2018  . Sepsis ([place]) 10/22/2018  . Wound infection after surgery 10/22/2018  . DVT (deep venous thrombosis) ([place]) 10/22/2018  . Hypothyroidism 10/22/2018  . Close Exposure to Covid-19 Virus 10/22/2018  . Below-knee amputation of right lower extremity ([place]) 10/05/2018  . Gangrene of right foot ([place])   . Amputated toe, right ([place]) 09/26/2018  . Subacute osteomyelitis, right ankle and foot ([place])   . Osteomyelitis of great toe of right foot ([place]) 06/19/2018  . Diabetic polyneuropathy associated with type 2 diabetes mellitus ([place]) 06/19/2018  . Body mass index 45.0-49.9, adult ([place]) 06/19/2018  . Seizure disorder ([place]) 08/27/2015  . Somnolence, daytime 08/27/2015  . Orthostatic hypotension 11/27/2014  . HLD (hyperlipidemia) 11/27/2014  . Syncope and collapse 08/20/2014  . Diabetic autonomic neuropathy associated with type 2 diabetes mellitus ([place]) 05/01/2014  . Dizzy spells 03/17/2014  . Acute renal failure superimposed on stage 3 chronic kidney disease ([place])  08/22/2013  . Type II diabetes mellitus, uncontrolled ([place]) 12/03/2009  . HYPERLIPIDEMIA 12/03/2009  . Morbid obesity ([place]) 12/03/2009  . OBSTRUCTIVE SLEEP APNEA 12/03/2009  . Essential hypertension, benign 12/03/2009  . EDEMA 12/03/2009  . PRECORDIAL PAIN 12/03/2009  . PROTEINURIA 12/03/2009    Orientation RESPIRATION BLADDER Height & Weight     Time, Situation, Place, Self  Normal Continent Weight: 283 lb 11.7 oz (128.7 kg) Height:  5\' 9"  (175.3 cm)  BEHAVIORAL SYMPTOMS/MOOD NEUROLOGICAL BOWEL NUTRITION STATUS      Continent Diet(heart healthy/carb modified, thin liquids)  AMBULATORY STATUS COMMUNICATION OF NEEDS Skin   Limited Assist Verbally Surgical wounds(Closed incision right leg. Open wound, right leg, Gauze. )                       Personal Care Assistance Level of Assistance  Bathing, Feeding, Dressing, Total care Bathing Assistance: Limited assistance Feeding assistance: Independent Dressing Assistance: Limited assistance     Functional Limitations Info  Sight, Hearing, Speech Sight Info: Adequate Hearing Info: Adequate Speech Info: Adequate    SPECIAL CARE FACTORS FREQUENCY        PT Frequency: 5x OT Frequency: 5x            Contractures Contractures Info: Not present    Additional Factors Info  Code Status, Allergies, Isolation Precautions Code Status Info: Full Code Allergies Info: No known allergies     Isolation Precautions Info: Droplet/Contact- pending COVID-19 test     Current Medications (10/25/2018):  This is the current hospital active medication list Current Facility-Administered Medications  Medication Dose Route Frequency Provider Last Rate  Last Dose  . acetaminophen (TYLENOL) tablet 650 mg  650 mg Oral Q6H PRN Ivor Costa, MD   650 mg at 10/23/18 1043   Or  . acetaminophen (TYLENOL) suppository 650 mg  650 mg Rectal Q6H PRN Ivor Costa, MD      . aspirin EC tablet 81 mg  81 mg Oral Daily Ivor Costa, MD   81 mg at 10/25/18 3016   . atorvastatin (LIPITOR) tablet 80 mg  80 mg Oral Daily Ivor Costa, MD   80 mg at 10/25/18 0109  . cefTRIAXone (ROCEPHIN) 2 g in sodium chloride 0.9 % 100 mL IVPB  2 g Intravenous Q24H Ivor Costa, MD 200 mL/hr at 10/24/18 2350 2 g at 10/24/18 2350  . DULoxetine (CYMBALTA) DR capsule 60 mg  60 mg Oral Daily Ivor Costa, MD   60 mg at 10/25/18 3235  . feeding supplement (PRO-STAT SUGAR FREE 64) liquid 30 mL  30 mL Oral TID Ivor Costa, MD   30 mL at 10/25/18 0921  . fenofibrate tablet 160 mg  160 mg Oral Daily Ivor Costa, MD   160 mg at 10/25/18 5732  . FLUoxetine (PROZAC) capsule 20 mg  20 mg Oral Daily Ivor Costa, MD   20 mg at 10/25/18 2025  . gabapentin (NEURONTIN) capsule 300 mg  300 mg Oral BID Ivor Costa, MD   300 mg at 10/25/18 4270  . hydrALAZINE (APRESOLINE) injection 5 mg  5 mg Intravenous Q2H PRN Ivor Costa, MD      . insulin aspart (novoLOG) injection 0-15 Units  0-15 Units Subcutaneous TID WC Modena Jansky, MD   5 Units at 10/25/18 [phone]  . insulin aspart (novoLOG) injection 0-5 Units  0-5 Units Subcutaneous QHS Modena Jansky, MD   2 Units at 10/24/18 2221  . insulin aspart (novoLOG) injection 6 Units  6 Units Subcutaneous TID WC Modena Jansky, MD   6 Units at 10/25/18 [phone]  . insulin glargine (LANTUS) injection 40 Units  40 Units Subcutaneous QHS Modena Jansky, MD   40 Units at 10/24/18 2220  . levothyroxine (SYNTHROID, LEVOTHROID) tablet 100 mcg  100 mcg Oral Daily Ivor Costa, MD   100 mcg at 10/25/18 0620  . midodrine (PROAMATINE) tablet 5 mg  5 mg Oral TID WC Ivor Costa, MD   5 mg at 10/25/18 0620  . multivitamin with minerals tablet 1 tablet  1 tablet Oral Daily Ivor Costa, MD   1 tablet at 10/25/18 [phone]  . ondansetron (ZOFRAN) tablet 4 mg  4 mg Oral Q6H PRN Ivor Costa, MD       Or  . ondansetron [facility]) injection 4 mg  4 mg Intravenous Q6H PRN Ivor Costa, MD   4 mg at 10/22/18 2348  . oxyCODONE-acetaminophen (PERCOCET/ROXICET) 5-325 MG per tablet 1 tablet  1 tablet  Oral Q4H PRN Ivor Costa, MD   1 tablet at 10/24/18 2220  . senna-docusate (Senokot-S) tablet 1 tablet  1 tablet Oral QHS PRN Ivor Costa, MD      . tamsulosin [facility]) capsule 0.4 mg  0.4 mg Oral Daily Ivor Costa, MD   0.4 mg at 10/25/18 6160  . tiZANidine (ZANAFLEX) tablet 4 mg  4 mg Oral BID Ivor Costa, MD   4 mg at 10/25/18 7371  . vancomycin (VANCOCIN) 1,250 mg in sodium chloride 0.9 % 250 mL IVPB  1,250 mg Intravenous Q24H Romona Curls, RPH 166.7 mL/hr at 10/24/18 2221 1,250 mg at 10/24/18 2221  . vitamin C (  ASCORBIC ACID) tablet 500 mg  500 mg Oral BID Ivor Costa, MD   500 mg at 10/25/18 4944  . zinc sulfate capsule 220 mg  220 mg Oral Daily Ivor Costa, MD   220 mg at 10/25/18 7395     Discharge Medications: Please see discharge summary for a list of discharge medications.  Relevant Imaging Results:  Relevant Lab Results:   Additional Information SSN: [id number]  Eileen Stanford, LCSW

## 2018-10-25 NOTE — Progress Notes (Signed)
Inpatient Diabetes Program Recommendations  AACE/ADA: New Consensus Statement on Inpatient Glycemic Control (2015)  Target Ranges:  Prepandial:   less than 140 mg/dL      Peak postprandial:   less than 180 mg/dL (1-2 hours)      Critically ill patients:  140 - 180 mg/dL     Review of Glycemic Control  Diabetes history: DM 2 Outpatient Diabetes medications:  Omnipod insulin pump with humulin R U-500 insulin, Victoza 1.8 mg Daily (follows with Dr. Dwyane Dee, Endocrinologist)  Current orders for Inpatient glycemic control:  Lantus 40 units qhs Novolog 0-15 units tid + Novolog 0-5 units qhs Novolog 6 units tid meal coverage  Inpatient Diabetes Program Recommendations:    Consider increasing Lantus to 45 units.   Postprandial glucose trends increase after meals on current meal coverage dose. Consider increasing Novolog meal coverage to 10 units tid if patient consumes at least 50% of meals.  Thanks,  Tama Headings RN, MSN, BC-ADM Inpatient Diabetes Coordinator Team Pager [phone] (8a-5p)

## 2018-10-26 ENCOUNTER — Inpatient Hospital Stay (HOSPITAL_COMMUNITY): Payer: Medicaid Other | Admitting: Anesthesiology

## 2018-10-26 ENCOUNTER — Encounter (HOSPITAL_COMMUNITY)
Admission: EM | Disposition: A | Discharge status: Home Health | Payer: Self-pay | Source: Skilled Nursing Facility | Attending: Internal Medicine

## 2018-10-26 DIAGNOSIS — T8149XA Infection following a procedure, other surgical site, initial encounter: Secondary | ICD-10-CM

## 2018-10-26 DIAGNOSIS — T8781 Dehiscence of amputation stump: Secondary | ICD-10-CM

## 2018-10-26 HISTORY — PX: AMPUTATION: SHX166

## 2018-10-26 LAB — BASIC METABOLIC PANEL WITH GFR
Anion gap: 7 (ref 5–15)
BUN: 51 mg/dL — ABNORMAL HIGH (ref 6–20)
CO2: 25 mmol/L (ref 22–32)
Calcium: 8.3 mg/dL — ABNORMAL LOW (ref 8.9–10.3)
Chloride: 102 mmol/L (ref 98–111)
Creatinine, Ser: 1.81 mg/dL — ABNORMAL HIGH (ref 0.61–1.24)
GFR calc Af Amer: 47 mL/min — ABNORMAL LOW (ref >60)
GFR calc non Af Amer: 41 mL/min — ABNORMAL LOW (ref >60)
Glucose, Bld: 214 mg/dL — ABNORMAL HIGH (ref 70–99)
Potassium: 4.6 mmol/L (ref 3.5–5.1)
Sodium: 134 mmol/L — ABNORMAL LOW (ref 135–145)

## 2018-10-26 LAB — CBC
HCT: 25.4 % — ABNORMAL LOW (ref 39.0–52.0)
Hemoglobin: 8 g/dL — ABNORMAL LOW (ref 13.0–17.0)
MCH: 26.7 pg (ref 26.0–34.0)
MCHC: 31.5 g/dL (ref 30.0–36.0)
MCV: 84.7 fL (ref 80.0–100.0)
Platelets: 541 K/uL — ABNORMAL HIGH (ref 150–400)
RBC: 3 MIL/uL — ABNORMAL LOW (ref 4.22–5.81)
RDW: 14.9 % (ref 11.5–15.5)
WBC: 14.3 K/uL — ABNORMAL HIGH (ref 4.0–10.5)
nRBC: 0 % (ref 0.0–0.2)

## 2018-10-26 LAB — GLUCOSE, CAPILLARY
Glucose-Capillary: 188 mg/dL — ABNORMAL HIGH (ref 70–99)
Glucose-Capillary: 209 mg/dL — ABNORMAL HIGH (ref 70–99)
Glucose-Capillary: 201 mg/dL — ABNORMAL HIGH (ref 70–99)
Glucose-Capillary: 211 mg/dL — ABNORMAL HIGH (ref 70–99)

## 2018-10-26 LAB — PROTIME-INR
INR: 1.8 — ABNORMAL HIGH (ref 0.8–1.2)
Prothrombin Time: 20.2 seconds — ABNORMAL HIGH (ref 11.4–15.2)

## 2018-10-26 SURGERY — AMPUTATION, ABOVE KNEE
Anesthesia: General | Site: Leg Upper | Laterality: Right

## 2018-10-26 MED ORDER — MIDAZOLAM HCL 2 MG/2ML IJ SOLN
INTRAMUSCULAR | Status: AC
  Filled 2018-10-26: qty 2

## 2018-10-26 MED ORDER — SUCCINYLCHOLINE CHLORIDE 200 MG/10ML IV SOSY
PREFILLED_SYRINGE | INTRAVENOUS | Status: AC
  Filled 2018-10-26: qty 10

## 2018-10-26 MED ORDER — LIDOCAINE 2% (20 MG/ML) 5 ML SYRINGE
INTRAMUSCULAR | Status: AC
  Filled 2018-10-26: qty 5

## 2018-10-26 MED ORDER — POLYETHYLENE GLYCOL 3350 17 G PO PACK: 17.0000 g | PACK | Freq: Every day | ORAL | Status: DC | PRN

## 2018-10-26 MED ORDER — HYDROMORPHONE HCL 1 MG/ML IJ SOLN
0.5000 mg | INTRAMUSCULAR | Status: DC | PRN
  Administered 2018-10-26: 0.5 mg via INTRAVENOUS
  Filled 2018-10-26: qty 1

## 2018-10-26 MED ORDER — FENTANYL CITRATE (PF) 250 MCG/5ML IJ SOLN
INTRAMUSCULAR | Status: AC
  Filled 2018-10-26: qty 5

## 2018-10-26 MED ORDER — PROPOFOL 10 MG/ML IV BOLUS
INTRAVENOUS | Status: AC
  Filled 2018-10-26: qty 20

## 2018-10-26 MED ORDER — METOCLOPRAMIDE HCL 5 MG PO TABS: 5.0000 mg | ORAL_TABLET | Freq: Three times a day (TID) | ORAL | Status: DC | PRN

## 2018-10-26 MED ORDER — MAGNESIUM CITRATE PO SOLN: 1.0000 | Freq: Once | ORAL | Status: DC | PRN

## 2018-10-26 MED ORDER — FENTANYL CITRATE (PF) 250 MCG/5ML IJ SOLN
INTRAMUSCULAR | Status: DC | PRN
  Administered 2018-10-26 (×3): 50 ug via INTRAVENOUS
  Administered 2018-10-26: 100 ug via INTRAVENOUS

## 2018-10-26 MED ORDER — METOCLOPRAMIDE HCL 5 MG/ML IJ SOLN: 5.0000 mg | Freq: Three times a day (TID) | INTRAMUSCULAR | Status: DC | PRN

## 2018-10-26 MED ORDER — SUCCINYLCHOLINE CHLORIDE 200 MG/10ML IV SOSY
PREFILLED_SYRINGE | INTRAVENOUS | Status: DC | PRN
  Administered 2018-10-26: 180 mg via INTRAVENOUS

## 2018-10-26 MED ORDER — ROCURONIUM BROMIDE 50 MG/5ML IV SOSY
PREFILLED_SYRINGE | INTRAVENOUS | Status: AC
  Filled 2018-10-26: qty 5

## 2018-10-26 MED ORDER — PROPOFOL 10 MG/ML IV BOLUS
INTRAVENOUS | Status: DC | PRN
  Administered 2018-10-26: 170 mg via INTRAVENOUS

## 2018-10-26 MED ORDER — METHOCARBAMOL 500 MG PO TABS: 500.0000 mg | ORAL_TABLET | Freq: Four times a day (QID) | ORAL | Status: DC | PRN

## 2018-10-26 MED ORDER — EPHEDRINE 5 MG/ML INJ
INTRAVENOUS | Status: AC
  Filled 2018-10-26: qty 10

## 2018-10-26 MED ORDER — CEFAZOLIN SODIUM-DEXTROSE 2-3 GM-%(50ML) IV SOLR
INTRAVENOUS | Status: DC | PRN
  Administered 2018-10-26: 2 g via INTRAVENOUS

## 2018-10-26 MED ORDER — LACTATED RINGERS IV SOLN
INTRAVENOUS | Status: DC | PRN
  Administered 2018-10-26: 15:00:00 via INTRAVENOUS

## 2018-10-26 MED ORDER — 0.9 % SODIUM CHLORIDE (POUR BTL) OPTIME
TOPICAL | Status: DC | PRN
  Administered 2018-10-26: 16:00:00 1000 mL

## 2018-10-26 MED ORDER — ONDANSETRON HCL 4 MG/2ML IJ SOLN: 4.0000 mg | Freq: Four times a day (QID) | INTRAMUSCULAR | Status: DC | PRN

## 2018-10-26 MED ORDER — OXYCODONE HCL 5 MG PO TABS
5.0000 mg | ORAL_TABLET | ORAL | Status: DC | PRN
  Administered 2018-10-28: 5 mg via ORAL
  Administered 2018-10-29 – 2018-10-30 (×3): 10 mg via ORAL
  Filled 2018-10-26 (×2): qty 2
  Filled 2018-10-26: qty 1
  Filled 2018-10-26: qty 2

## 2018-10-26 MED ORDER — METHOCARBAMOL 1000 MG/10ML IJ SOLN
500.0000 mg | Freq: Four times a day (QID) | INTRAVENOUS | Status: DC | PRN
  Filled 2018-10-26: qty 5

## 2018-10-26 MED ORDER — ONDANSETRON HCL 4 MG/2ML IJ SOLN
INTRAMUSCULAR | Status: AC
  Filled 2018-10-26: qty 2

## 2018-10-26 MED ORDER — DOCUSATE SODIUM 100 MG PO CAPS
100.0000 mg | ORAL_CAPSULE | Freq: Two times a day (BID) | ORAL | Status: DC
  Administered 2018-10-26 – 2018-10-31 (×10): 100 mg via ORAL
  Filled 2018-10-26 (×10): qty 1

## 2018-10-26 MED ORDER — PHENYLEPHRINE 40 MCG/ML (10ML) SYRINGE FOR IV PUSH (FOR BLOOD PRESSURE SUPPORT)
PREFILLED_SYRINGE | INTRAVENOUS | Status: DC | PRN
  Administered 2018-10-26 (×2): 120 ug via INTRAVENOUS

## 2018-10-26 MED ORDER — OXYCODONE HCL 5 MG PO TABS
10.0000 mg | ORAL_TABLET | ORAL | Status: DC | PRN
  Administered 2018-10-27 – 2018-10-28 (×4): 15 mg via ORAL
  Filled 2018-10-26 (×4): qty 3

## 2018-10-26 MED ORDER — BISACODYL 10 MG RE SUPP: 10.0000 mg | Freq: Every day | RECTAL | Status: DC | PRN

## 2018-10-26 MED ORDER — MIDAZOLAM HCL 2 MG/2ML IJ SOLN
INTRAMUSCULAR | Status: DC | PRN
  Administered 2018-10-26: 2 mg via INTRAVENOUS

## 2018-10-26 MED ORDER — SODIUM CHLORIDE 0.9 % IV SOLN: INTRAVENOUS | Status: DC

## 2018-10-26 MED ORDER — ONDANSETRON HCL 4 MG PO TABS: 4.0000 mg | ORAL_TABLET | Freq: Four times a day (QID) | ORAL | Status: DC | PRN

## 2018-10-26 MED ORDER — LIDOCAINE 2% (20 MG/ML) 5 ML SYRINGE
INTRAMUSCULAR | Status: DC | PRN
  Administered 2018-10-26: 100 mg via INTRAVENOUS

## 2018-10-26 MED ORDER — METOPROLOL TARTRATE 5 MG/5ML IV SOLN
5.0000 mg | Freq: Once | INTRAVENOUS | Status: AC
  Administered 2018-10-26: 5 mg via INTRAVENOUS
  Filled 2018-10-26: qty 5

## 2018-10-26 MED ORDER — ACETAMINOPHEN 325 MG PO TABS: 325.0000 mg | ORAL_TABLET | Freq: Four times a day (QID) | ORAL | Status: DC | PRN

## 2018-10-26 MED ORDER — SUGAMMADEX SODIUM 500 MG/5ML IV SOLN
INTRAVENOUS | Status: AC
  Filled 2018-10-26: qty 5

## 2018-10-26 SURGICAL SUPPLY — 33 items
BLADE SAW RECIP 87.9 MT (BLADE) ×2 IMPLANT
BNDG COHESIVE 6X5 TAN STRL LF (GAUZE/BANDAGES/DRESSINGS) ×2 IMPLANT
CANISTER WOUND CARE 500ML ATS (WOUND CARE) IMPLANT
COVER SURGICAL LIGHT HANDLE (MISCELLANEOUS) ×2 IMPLANT
COVER WAND RF STERILE (DRAPES) ×2 IMPLANT
CUFF TOURNIQUET SINGLE 34IN LL (TOURNIQUET CUFF) IMPLANT
DRAPE INCISE IOBAN 66X45 STRL (DRAPES) ×4 IMPLANT
DRAPE U-SHAPE 47X51 STRL (DRAPES) ×2 IMPLANT
DRESSING PREVENA PLUS CUSTOM (GAUZE/BANDAGES/DRESSINGS) ×1 IMPLANT
DRSG PREVENA PLUS CUSTOM (GAUZE/BANDAGES/DRESSINGS) ×2
DURAPREP 26ML APPLICATOR (WOUND CARE) ×2 IMPLANT
ELECT REM PT RETURN 9FT ADLT (ELECTROSURGICAL) ×2
ELECTRODE REM PT RTRN 9FT ADLT (ELECTROSURGICAL) ×1 IMPLANT
GLOVE BIOGEL PI IND STRL 9 (GLOVE) ×1 IMPLANT
GLOVE BIOGEL PI INDICATOR 9 (GLOVE) ×1
GLOVE SURG ORTHO 9.0 STRL STRW (GLOVE) ×2 IMPLANT
GOWN STRL REUS W/ TWL XL LVL3 (GOWN DISPOSABLE) ×2 IMPLANT
GOWN STRL REUS W/TWL XL LVL3 (GOWN DISPOSABLE) ×4
KIT BASIN OR (CUSTOM PROCEDURE TRAY) ×2 IMPLANT
KIT TURNOVER KIT B (KITS) ×2 IMPLANT
MANIFOLD NEPTUNE II (INSTRUMENTS) ×2 IMPLANT
NS IRRIG 1000ML POUR BTL (IV SOLUTION) ×2 IMPLANT
PACK ORTHO EXTREMITY (CUSTOM PROCEDURE TRAY) ×2 IMPLANT
PAD ARMBOARD 7.5X6 YLW CONV (MISCELLANEOUS) ×2 IMPLANT
PREVENA RESTOR ARTHOFORM 46X30 (CANNISTER) ×3 IMPLANT
STAPLER VISISTAT 35W (STAPLE) IMPLANT
STOCKINETTE IMPERVIOUS LG (DRAPES) IMPLANT
SUT ETHILON 2 0 PSLX (SUTURE) ×4 IMPLANT
SUT SILK 2 0 (SUTURE) ×2
SUT SILK 2-0 18XBRD TIE 12 (SUTURE) ×1 IMPLANT
TOWEL GREEN STERILE FF (TOWEL DISPOSABLE) ×2 IMPLANT
TUBE CONNECTING 20X1/4 (TUBING) ×2 IMPLANT
YANKAUER SUCT BULB TIP NO VENT (SUCTIONS) ×2 IMPLANT

## 2018-10-26 NOTE — Anesthesia Preprocedure Evaluation (Signed)
Anesthesia Evaluation  Patient identified by MRN, date of birth, ID band Patient awake    Reviewed: Allergy & Precautions, NPO status , Patient's Chart, lab work & pertinent test results  Airway Mallampati: II  TM Distance: <3 FB Neck ROM: Full    Dental no notable dental hx.    Pulmonary sleep apnea and Continuous Positive Airway Pressure Ventilation , former smoker,    breath sounds clear to auscultation + decreased breath sounds      Cardiovascular hypertension, Normal cardiovascular exam+ pacemaker  Rhythm:Regular Rate:Normal  EF 60%   Neuro/Psych negative neurological ROS  negative psych ROS   GI/Hepatic negative GI ROS, Neg liver ROS,   Endo/Other  diabetesHypothyroidism Morbid obesity  Renal/GU Renal InsufficiencyRenal disease  negative genitourinary   Musculoskeletal negative musculoskeletal ROS (+)   Abdominal   Peds negative pediatric ROS (+)  Hematology  (+) anemia ,   Anesthesia Other Findings   Reproductive/Obstetrics negative OB ROS                             Anesthesia Physical Anesthesia Plan  ASA: III  Anesthesia Plan: General   Post-op Pain Management:    Induction: Intravenous and Rapid sequence  PONV Risk Score and Plan: 2 and Ondansetron and Treatment may vary due to age or medical condition  Airway Management Planned: Oral ETT  Additional Equipment:   Intra-op Plan:   Post-operative Plan: Extubation in OR  Informed Consent: I have reviewed the patients History and Physical, chart, labs and discussed the procedure including the risks, benefits and alternatives for the proposed anesthesia with the patient or authorized representative who has indicated his/her understanding and acceptance.     Dental advisory given  Plan Discussed with: CRNA and Surgeon  Anesthesia Plan Comments:         Anesthesia Quick Evaluation

## 2018-10-26 NOTE — Transfer of Care (Signed)
Immediate Anesthesia Transfer of Care Note  Patient: Ethan Campbell  Procedure(s) Performed: RIGHT ABOVE KNEE AMPUTATION (Right Leg Upper)  Patient Location: 2W28  Anesthesia Type:General  Level of Consciousness: awake, alert , oriented and patient cooperative  Airway & Oxygen Therapy: Patient Spontanous Breathing  Post-op Assessment: Report given to RN and Post -op Vital signs reviewed and stable  Post vital signs: Reviewed and stable  Last Vitals:  Vitals Value Taken Time  BP    Temp    Pulse    Resp    SpO2      Last Pain:  Vitals:   10/26/18 0900  TempSrc:   PainSc: 0-No pain         Complications: No apparent anesthesia complications

## 2018-10-26 NOTE — Anesthesia Postprocedure Evaluation (Signed)
Anesthesia Post Note  Patient: Ethan Campbell  Procedure(s) Performed: RIGHT ABOVE KNEE AMPUTATION (Right Leg Upper)     Patient location during evaluation: PACU Anesthesia Type: General Level of consciousness: awake and alert Pain management: pain level controlled Vital Signs Assessment: post-procedure vital signs reviewed and stable Respiratory status: spontaneous breathing, nonlabored ventilation, respiratory function stable and patient connected to nasal cannula oxygen Cardiovascular status: blood pressure returned to baseline and stable Postop Assessment: no apparent nausea or vomiting Anesthetic complications: no    Last Vitals:  Vitals:   10/25/18 2302 10/26/18 0857  BP: 122/71 120/60  Pulse: (!) 110 68  Resp: 18   Temp: 37.2 C 37.4 C  SpO2: 98% 99%    Last Pain:  Vitals:   10/26/18 0900  TempSrc:   PainSc: 0-No pain                 Austyn Seier S

## 2018-10-26 NOTE — Op Note (Signed)
10/26/2018  5:20 PM  PATIENT:  Ethan Campbell    PRE-OPERATIVE DIAGNOSIS:  Gangrene Right Below Knee Amputation  POST-OPERATIVE DIAGNOSIS:  Same  PROCEDURE:  RIGHT ABOVE KNEE AMPUTATION Application of wound VAC.  SURGEON:  Newt Minion, MD  PHYSICIAN ASSISTANT:None ANESTHESIA:   General  PREOPERATIVE INDICATIONS:  Ethan Campbell is a  58 y.o. male with a diagnosis of Gangrene Right Below Knee Amputation who failed conservative measures and elected for surgical management.    The risks benefits and alternatives were discussed with the patient preoperatively including but not limited to the risks of infection, bleeding, nerve injury, cardiopulmonary complications, the need for revision surgery, among others, and the patient was willing to proceed.  OPERATIVE IMPLANTS: Praveena customizable and restore wound VAC  @ENCIMAGES @  OPERATIVE FINDINGS: No abscess or osteomyelitis at the level of the amputation however the muscle and soft tissue did not have good color consistency the muscle did have contractility.  OPERATIVE PROCEDURE: Patient was brought the operating room and underwent a general anesthetic.  After adequate levels anesthesia were obtained patient's right lower extremity was prepped using DuraPrep draped into a sterile field a timeout was called.  A fishmouth incision was made proximal to the patella the necrotic below the knee amputation was wrapped out with an impervious stockinette the necrotic tissue was not exposed to the surgical field.  The fishmouth incision was made this was carried down to the vascular bundles medially these were clamped and suture ligated with 2-0 silk.  The bone was resected with a reciprocating saw.  The amputation was completed.  Electrocautery was used for hemostasis.  Patient had muscle and soft tissue that did not have good vascularity and this soft tissue envelope was resected back a few more inches.  The tissue appeared more healthy and  viable there was no signs of infection at the level of amputation.  Electrocautery was used for hemostasis.  The deep and superficial fascia layers and skin was closed using 2-0 nylon.  A Praveena customizable and restore dressing were applied.  This had a good suction fit patient was extubated and recovered in the operating room and taken straight back to his room.   DISCHARGE PLANNING:  Antibiotic duration: Continue IV antibiotics for 4 weeks  Weightbearing: Nonweightbearing on the right  Pain medication: Opioid pathway ordered  Dressing care/ Wound VAC: Wound VAC for 1 week  Ambulatory devices: Wheelchair  Discharge to: Anticipate discharge back to skilled nursing.  Follow-up: In the office 1 week post operative.

## 2018-10-26 NOTE — Interval H&P Note (Signed)
History and Physical Interval Note:  10/26/2018 7:13 AM  Ethan Campbell  has presented today for surgery, with the diagnosis of Gangrene Right Below Knee Amputation.  The various methods of treatment have been discussed with the patient and family. After consideration of risks, benefits and other options for treatment, the patient has consented to  Procedure(s): RIGHT ABOVE KNEE AMPUTATION (Right) as a surgical intervention.  The patient's history has been reviewed, patient examined, no change in status, stable for surgery.  I have reviewed the patient's chart and labs.  Questions were answered to the patient's satisfaction.     Erlinda Hong, PA-C [employer] [phone]

## 2018-10-26 NOTE — Progress Notes (Signed)
Pt BP 81/58, HR 91, pt asymptomatic no dizziness, pt remains alert and oriented x 4. NP on call notified. Will monitor pt for now, since metoprolol 5 mg was given earlier for tachycardia.

## 2018-10-26 NOTE — Progress Notes (Signed)
Pt BP improved, HR controled, pt denied discomfort, no signs of acute distress. Will continue to monitor pt closely.

## 2018-10-26 NOTE — Progress Notes (Signed)
PROGRESS NOTE    Ethan Campbell  ZOX:[id number] DOB: May 09, 1961 DOA: 10/22/2018 PCP: Arsenio Katz, NP   Brief Narrative: Patient is a 58 year old male with history of hypertension, hyperlipidemia, diabetes type 2 on insulin pump, hypothyroidism, depression, left arm DVT on Coumadin, bone cancer, OSA on CPAP, CKD stage III who presented with complaints of right leg wound infection. Patient just had BKA of right lower extremity by orthopedics on 10/04/2018.  Wound VAC was taken down 6 days PTA.  He was also having fever of 103 F and chills at skilled nursing facility.  Patient stated that he had contact with Covid-19 positive health worker at the facility.  Test for COVID-19 sent from SNF on 3/30 and results still pending as of today.  Patient is completely asymptomatic. Orthopedics planning for right AKA on Friday 4/3 at 7:30 AM.  Dr. Sharol Given following.  Assessment & Plan:   Principal Problem:   Wound infection after surgery Active Problems:   Essential hypertension, benign   Acute renal failure superimposed on stage 3 chronic kidney disease (HCC)   HLD (hyperlipidemia)   Below-knee amputation of right lower extremity (HCC)   Uncontrolled type 2 diabetes mellitus with polyneuropathy (HCC)   Normocytic anemia   Sepsis (HCC)   DVT (deep venous thrombosis) ([place])   Hypothyroidism   Close Exposure to Covid-19 Virus   Dehiscence of amputation stump (HCC)   Severe protein-calorie malnutrition ([place])   Suspected sepsis secondary to postop wound infection of the right lower extremity:  History of recent BKA.   Right lower extremity wound appears dark, mottled.   Presented with fever, tachycardia, leukocytosis.  Hemodynamically stable.   Failed outpatient doxycycline treatment. Started on empiric antibiotics, IV ceftriaxone and vancomycin.  Blood cultures x2: Negative to date. Sepsis physiology resolved, defervesced, leukocytosis has improved Definitive treatment/plan for right AKA 4/3.   Patient was seen this morning prior to procedure.  Right lower extremity amputation site infection:  Amputation site is dark, mottled.  Staples still there.   Orthopedics, Dr. Sharol Given, following and planning for right AKA on Friday 4/3. Continue antibiotics. I discussed with Dr. Sharol Given on 4/2.  Patient was seen this morning prior to procedure.  Although he was supposed to be for surgery at 730, it appears that this has been delayed.  Suspicion for Covid-19:  Patient stated that he had contact with nursing staff with COVID-19.  Test has been sent.  We will follow-up report.  Patient is completely asymptomatic.  No shortness of breath or cough.   Was on airborne isolation.  Currently on droplet isolation and contact precautions. As per RN verification with [facility], test was sent off on 3/30 and results to date are pending.  Essential hypertension: Lotensin, Lasix held due to worsening renal function.  Continue PRN meds for now.  Reasonable control for now.  Also on midodrine, suspect due to orthostatic hypotension.  AKI on CKD stage III:  Baseline creatinine ranges from 2-2.3.  Presented with mild acute kidney injury.  Acute kidney injury resolved.  IV fluids discontinued.  Creatinine fluctuating but still within range.  Follow BMP in a.m.  Type 2 diabetes mellitus/hypoglycemia:  Hemoglobin A1c of 6.9 on 11/19.  Patient had a mild hypoglycemic episode with CBG of 54 on 4/1 while he was still on U5 100 insulin pump, Lantus 5 units daily and NovoLog SSI. Discussed in detail with diabetes coordinator 4/1 and as per their recommendations, DC'ed insulin pump/U5 100, started Lantus 40 units daily, SSI and  mealtime NovoLog.  CBGs mildly uncontrolled, 188 mg per DL this morning.  Supposed to be for surgery this morning.  Postop can tighten glycemic control.  Hyperlipidemia:  Continue home meds  Normocytic anemia:  Chronic normocytic anemia most likely from the chronic comorbidities.  Possibly  anemia of chronic disease.  Hemoglobin stable in the 8-9 range.  Follow postop CBC in a.m.  History of DVT:  Currently on Coumadin.  INR down to 1.8, hold Coumadin in anticipation for surgery in 48 hours.  Resume postop Coumadin when okay with orthopedics. As per patient, he had a solitary episode of left upper extremity DVT approximately 1 year ago.  He is unsure as to why he is on long-term anticoagulation.  He denies history of recurrent DVT, PE or being told of any hypercoagulable status. As per review of cardiology follow-up in care everywhere, history of run of atrial tachycardia versus atrial flutter noted in March 2019.  As per cardiology follow-up 02/21/2018, he was advised to continue warfarin for stroke prophylaxis.  Atrial tachycardia Management as noted above.  On anticoagulation for this indication.  Follows with cardiology at [facility].  Hypothyroidism:  Continue Synthyroid  OSA: Uses CPAP at night, currently not getting due to Covid-19 rule out.  Morbid obesity/Body mass index is 41.9 kg/m.   Chronic diastolic CHF Compensated.  Currently off of diuretics.  History of cardiogenic syncope/sick sinus syndrome As per nephrology note in care everywhere.  He is status post pacemaker 02/14/2017 at [facility]  Seizure disorder Patient reports that he is not on medications for this.  Claims that his last episode was prior to last surgery in early March 2020.  Does not drive.  History of orthostatic hypotension Remains on midodrine.  Followed at [facility].      PPE: Patient: None.  Provider: Head cover,CAPR, gown, gloves and shoe cover.  Currently on droplet and contact precautions. DVT prophylaxis: Anticoagulated on Coumadin, held for couple days now for surgery. Code Status: Full code Family Communication: None present at the bedside Disposition Plan: To be determined pending surgery and clinical improvement.  He presented from SNF.?  Return to  SNF.   Consultants:  Orthopedics  Procedures:  None  Antimicrobials:  Anti-infectives (From admission, onward)   Start     Dose/Rate Route Frequency Ordered Stop   10/26/18 0700  ceFAZolin (ANCEF) IVPB 2g/100 mL premix     2 g 200 mL/hr over 30 Minutes Intravenous To [facility] 10/25/18 1959 10/27/18 0700   10/24/18 2100  vancomycin (VANCOCIN) 1,250 mg in sodium chloride 0.9 % 250 mL IVPB     1,250 mg 166.7 mL/hr over 90 Minutes Intravenous Every 24 hours 10/24/18 0936     10/24/18 0800  vancomycin (VANCOCIN) 1,500 mg in sodium chloride 0.9 % 500 mL IVPB  Status:  Discontinued     1,500 mg 250 mL/hr over 120 Minutes Intravenous Every 36 hours 10/22/18 1917 10/23/18 0919   10/23/18 2100  vancomycin (VANCOCIN) 1,000 mg in sodium chloride 0.9 % 250 mL IVPB  Status:  Discontinued     1,000 mg 250 mL/hr over 60 Minutes Intravenous Every 24 hours 10/23/18 0919 10/24/18 0936   10/23/18 0000  cefTRIAXone (ROCEPHIN) 2 g in sodium chloride 0.9 % 100 mL IVPB     2 g 200 mL/hr over 30 Minutes Intravenous Every 24 hours 10/22/18 2345     10/22/18 1930  ceFEPIme (MAXIPIME) 2 g in sodium chloride 0.9 % 100 mL IVPB  Status:  Discontinued     2 g 200 mL/hr over 30 Minutes Intravenous Every 24 hours 10/22/18 1917 10/22/18 2345   10/22/18 1930  vancomycin (VANCOCIN) 2,500 mg in sodium chloride 0.9 % 500 mL IVPB     2,500 mg 250 mL/hr over 120 Minutes Intravenous  Once 10/22/18 1917 10/23/18 1905      Subjective: Patient was seen this morning prior to procedure.  Denies complaints.  Denies right lower extremity pain.  Denies chest pain, dyspnea, palpitations, dizziness or lightheadedness.  Patient states that prior to recent surgery, he stayed with his sister and moved around with the help of a wheelchair.  Objective: Vitals:   10/25/18 1616 10/25/18 2213 10/25/18 2302 10/26/18 0857  BP: 136/77 119/77 122/71 120/60  Pulse: (!) 133 100 (!) 110 68  Resp: 18 18 18    Temp: 99 F (37.2  C) 98.9 F (37.2 C) 98.9 F (37.2 C) 99.3 F (37.4 C)  TempSrc: Oral Oral Oral Oral  SpO2: 98% 97% 98% 99%  Weight:      Height:        Intake/Output Summary (Last 24 hours) at 10/26/2018 1019 Last data filed at 10/26/2018 0559 Gross per 24 hour  Intake 360 ml  Output 1750 ml  Net -1390 ml   Filed Weights   10/22/18 1627 10/22/18 2350  Weight: (!) 140.2 kg 128.7 kg    Examination:  General exam: Pleasant young male, moderately built and morbidly obese lying comfortably propped up in bed.  Oral mucosa moist.  Does not appear in any distress. Respiratory system: Clear to auscultation.  No increased work of breathing. Cardiovascular system: S1 & S2 heard, RRR. No JVD, murmurs, rubs, gallops or clicks. No pedal edema.  Stable. Gastrointestinal system: Abdomen is nondistended, soft and nontender. No organomegaly or masses felt. Normal bowel sounds heard.  Stable. Central nervous system: Alert and oriented. No focal neurological deficits. Extremities: Right BKA stump dressing mildly soiled, no odor.  Symmetric 5 x 5 power. Skin: No rashes, lesions or ulcers,no icterus ,no pallor   Data Reviewed: I have personally reviewed following labs and imaging studies  CBC: Recent Labs  Lab 10/22/18 1652 10/23/18 0323 10/24/18 0523 10/25/18 0721 10/26/18 0800  WBC 18.9* 18.2* 11.2* 11.1* 14.3*  NEUTROABS 14.9*  --  8.7*  --   --   HGB 8.9* 8.1* 9.1* 8.0* 8.0*  HCT 30.2* 25.6* 30.1* 25.7* 25.4*  MCV 87.3 85.3 88.8 85.4 84.7  PLT 680* 582* 472* 558* 888*   Basic Metabolic Panel: Recent Labs  Lab 10/22/18 1652 10/23/18 0323 10/24/18 0523 10/25/18 0721 10/26/18 0800  NA 136 138 139 133* 134*  K 4.7 4.2 4.3 4.2 4.6  CL 98 103 105 102 102  CO2 28 28 21* 26 25  GLUCOSE 159* 97 53* 222* 214*  BUN 81* 69* 54* 47* 51*  CREATININE 2.74* 2.26* 1.78* 1.69* 1.81*  CALCIUM 9.1 8.3* 8.6* 8.1* 8.3*   GFR: Estimated Creatinine Clearance: 59.8 mL/min (A) (by C-G formula based on SCr of  1.81 mg/dL (H)). Liver Function Tests: Recent Labs  Lab 10/22/18 1652  AST 41  ALT 29  ALKPHOS 108  BILITOT 0.7  PROT 7.8  ALBUMIN 2.4*   Coagulation Profile: Recent Labs  Lab 10/22/18 2057 10/23/18 0323 10/24/18 0948 10/25/18 0721 10/26/18 0800  INR 3.0* 3.2* 3.1* 2.2* 1.8*   CBG: Recent Labs  Lab 10/25/18 0618 10/25/18 1210 10/25/18 1612 10/25/18 2207 10/26/18 0903  GLUCAP 209* 245* 215* 204* 188*  Recent Results (from the past 240 hour(s))  Blood culture (routine x 2)     Status: None (Preliminary result)   Collection Time: 10/22/18  5:00 PM  Result Value Ref Range Status   Specimen Description BLOOD RIGHT ANTECUBITAL  Final   Special Requests   Final    BOTTLES DRAWN AEROBIC AND ANAEROBIC Blood Culture results may not be optimal due to an excessive volume of blood received in culture bottles   Culture   Final    NO GROWTH 4 DAYS Performed at [facility] [address] [place], [place] [address]    Report Status PENDING  Incomplete  Blood culture (routine x 2)     Status: None (Preliminary result)   Collection Time: 10/22/18  5:10 PM  Result Value Ref Range Status   Specimen Description BLOOD LEFT ANTECUBITAL  Final   Special Requests   Final    BOTTLES DRAWN AEROBIC AND ANAEROBIC Blood Culture results may not be optimal due to an excessive volume of blood received in culture bottles   Culture   Final    NO GROWTH 4 DAYS Performed at [facility], [place] [address], [place], [place] [address]    Report Status PENDING  Incomplete         Radiology Studies: No results found.      Scheduled Meds: . aspirin EC  81 mg Oral Daily  . atorvastatin  80 mg Oral Daily  . chlorhexidine  60 mL Topical Once  . DULoxetine  60 mg Oral Daily  . feeding supplement (PRO-STAT SUGAR FREE 64)  30 mL Oral TID  . fenofibrate  160 mg Oral Daily  . FLUoxetine  20 mg Oral Daily  . gabapentin  300 mg Oral BID  . insulin aspart  0-15 Units  Subcutaneous TID WC  . insulin aspart  0-5 Units Subcutaneous QHS  . insulin aspart  6 Units Subcutaneous TID WC  . insulin glargine  40 Units Subcutaneous QHS  . levothyroxine  100 mcg Oral Daily  . midodrine  5 mg Oral TID WC  . multivitamin with minerals  1 tablet Oral Daily  . tamsulosin  0.4 mg Oral Daily  . tiZANidine  4 mg Oral BID  . vitamin C  500 mg Oral BID  . zinc sulfate  220 mg Oral Daily   Continuous Infusions: .  ceFAZolin (ANCEF) IV    . cefTRIAXone (ROCEPHIN)  IV 2 g (10/26/18 0007)  . vancomycin 1,250 mg (10/25/18 2150)     LOS: 4 days     Vernell Leep, MD, FACP, [facility]. [facility]  To contact the attending provider between 7A-7P or the covering provider during after hours 7P-7A, please log into the web site [email or web address] and access using universal [place] password for that web site. If you do not have the password, please call the hospital operator.

## 2018-10-26 NOTE — Anesthesia Procedure Notes (Signed)
Procedure Name: Intubation Date/Time: 10/26/2018 3:30 PM Performed by: Elayne Snare, CRNA Pre-anesthesia Checklist: Patient identified, Emergency Drugs available, Suction available and Patient being monitored Patient Re-evaluated:Patient Re-evaluated prior to induction Oxygen Delivery Method: Circle System Utilized Preoxygenation: Pre-oxygenation with 100% oxygen Induction Type: Rapid sequence Laryngoscope Size: Glidescope and 4 Grade View: Grade I Tube type: Oral Tube size: 7.5 mm Number of attempts: 1 Airway Equipment and Method: Stylet and Video-laryngoscopy Placement Confirmation: ETT inserted through vocal cords under direct vision,  positive ETCO2 and breath sounds checked- equal and bilateral Secured at: 23 cm Tube secured with: Tape Dental Injury: Teeth and Oropharynx as per pre-operative assessment  Comments: Elective glidescope due to possible COVID-19

## 2018-10-26 NOTE — Plan of Care (Signed)
  Problem: Education: Goal: Knowledge of General Education information will improve Description Including pain rating scale, medication(s)/side effects and non-pharmacologic comfort measures Outcome: Progressing   

## 2018-10-26 NOTE — Progress Notes (Signed)
Pt has tachycardia, HR in 120s, pt also had some changer in rhythm, pacemaker pacing. Other VS stable. NP on call notified, metoprolol 5mg  ordered.

## 2018-10-27 LAB — CBC
HCT: 19.1 % — ABNORMAL LOW (ref 39.0–52.0)
HCT: 21.7 % — ABNORMAL LOW (ref 39.0–52.0)
Hemoglobin: 5.8 g/dL — CL (ref 13.0–17.0)
Hemoglobin: 7 g/dL — ABNORMAL LOW (ref 13.0–17.0)
MCH: 26.4 pg (ref 26.0–34.0)
MCH: 27.5 pg (ref 26.0–34.0)
MCHC: 30.4 g/dL (ref 30.0–36.0)
MCHC: 32.3 g/dL (ref 30.0–36.0)
MCV: 86.8 fL (ref 80.0–100.0)
MCV: 85.1 fL (ref 80.0–100.0)
Platelets: 442 K/uL — ABNORMAL HIGH (ref 150–400)
Platelets: 537 10*3/uL — ABNORMAL HIGH (ref 150–400)
RBC: 2.2 MIL/uL — ABNORMAL LOW (ref 4.22–5.81)
RBC: 2.55 MIL/uL — ABNORMAL LOW (ref 4.22–5.81)
RDW: 14.9 % (ref 11.5–15.5)
RDW: 15 % (ref 11.5–15.5)
WBC: 11.5 K/uL — ABNORMAL HIGH (ref 4.0–10.5)
WBC: 14.4 10*3/uL — ABNORMAL HIGH (ref 4.0–10.5)
nRBC: 0 % (ref 0.0–0.2)
nRBC: 0 % (ref 0.0–0.2)

## 2018-10-27 LAB — BASIC METABOLIC PANEL
Anion gap: 9 (ref 5–15)
Anion gap: 9 (ref 5–15)
BUN: 62 mg/dL — ABNORMAL HIGH (ref 6–20)
BUN: 69 mg/dL — ABNORMAL HIGH (ref 6–20)
CO2: 21 mmol/L — ABNORMAL LOW (ref 22–32)
CO2: 23 mmol/L (ref 22–32)
Calcium: 7.8 mg/dL — ABNORMAL LOW (ref 8.9–10.3)
Calcium: 7.9 mg/dL — ABNORMAL LOW (ref 8.9–10.3)
Chloride: 102 mmol/L (ref 98–111)
Chloride: 98 mmol/L (ref 98–111)
Creatinine, Ser: 2.42 mg/dL — ABNORMAL HIGH (ref 0.61–1.24)
Creatinine, Ser: 2.54 mg/dL — ABNORMAL HIGH (ref 0.61–1.24)
GFR calc Af Amer: 33 mL/min — ABNORMAL LOW (ref >60)
GFR calc Af Amer: 31 mL/min — ABNORMAL LOW (ref >60)
GFR calc non Af Amer: 29 mL/min — ABNORMAL LOW (ref >60)
GFR calc non Af Amer: 27 mL/min — ABNORMAL LOW (ref >60)
Glucose, Bld: 241 mg/dL — ABNORMAL HIGH (ref 70–99)
Glucose, Bld: 271 mg/dL — ABNORMAL HIGH (ref 70–99)
Potassium: 5.2 mmol/L — ABNORMAL HIGH (ref 3.5–5.1)
Potassium: 5 mmol/L (ref 3.5–5.1)
Sodium: 132 mmol/L — ABNORMAL LOW (ref 135–145)
Sodium: 130 mmol/L — ABNORMAL LOW (ref 135–145)

## 2018-10-27 LAB — CULTURE, BLOOD (ROUTINE X 2)
Culture: NO GROWTH
Culture: NO GROWTH

## 2018-10-27 LAB — GLUCOSE, CAPILLARY
Glucose-Capillary: 240 mg/dL — ABNORMAL HIGH (ref 70–99)
Glucose-Capillary: 241 mg/dL — ABNORMAL HIGH (ref 70–99)
Glucose-Capillary: 206 mg/dL — ABNORMAL HIGH (ref 70–99)
Glucose-Capillary: 182 mg/dL — ABNORMAL HIGH (ref 70–99)

## 2018-10-27 LAB — PREPARE RBC (CROSSMATCH)

## 2018-10-27 LAB — PROTIME-INR
INR: 1.7 — ABNORMAL HIGH (ref 0.8–1.2)
Prothrombin Time: 19.3 seconds — ABNORMAL HIGH (ref 11.4–15.2)

## 2018-10-27 MED ORDER — SODIUM CHLORIDE 0.9% FLUSH
10.0000 mL | INTRAVENOUS | Status: DC | PRN
  Administered 2018-10-31: 10 mL
  Filled 2018-10-27: qty 40

## 2018-10-27 MED ORDER — WARFARIN - PHARMACIST DOSING INPATIENT: Freq: Every day | Status: DC

## 2018-10-27 MED ORDER — SODIUM CHLORIDE 0.9 % IV BOLUS
500.0000 mL | Freq: Once | INTRAVENOUS | Status: AC
  Administered 2018-10-27: 500 mL via INTRAVENOUS

## 2018-10-27 MED ORDER — VANCOMYCIN HCL 1000 MG IV SOLR
1000.0000 mg | INTRAVENOUS | Status: DC
  Administered 2018-10-27 – 2018-10-28 (×2): 1000 mg via INTRAVENOUS
  Filled 2018-10-27 (×3): qty 1000

## 2018-10-27 MED ORDER — SODIUM CHLORIDE 0.9% IV SOLUTION
Freq: Once | INTRAVENOUS | Status: AC
  Administered 2018-10-27: 04:00:00 via INTRAVENOUS

## 2018-10-27 MED ORDER — METOPROLOL TARTRATE 5 MG/5ML IV SOLN
2.5000 mg | INTRAVENOUS | Status: DC | PRN
  Administered 2018-10-27: 2.5 mg via INTRAVENOUS
  Filled 2018-10-27: qty 5

## 2018-10-27 MED ORDER — METOPROLOL TARTRATE 12.5 MG HALF TABLET
12.5000 mg | ORAL_TABLET | Freq: Two times a day (BID) | ORAL | Status: DC
  Administered 2018-10-27 – 2018-10-31 (×8): 12.5 mg via ORAL
  Filled 2018-10-27 (×9): qty 1

## 2018-10-27 MED ORDER — SODIUM CHLORIDE 0.9 % IV SOLN
INTRAVENOUS | Status: AC
  Administered 2018-10-27 – 2018-10-28 (×2): via INTRAVENOUS

## 2018-10-27 MED ORDER — WARFARIN SODIUM 7.5 MG PO TABS
7.5000 mg | ORAL_TABLET | Freq: Once | ORAL | Status: AC
  Administered 2018-10-27: 7.5 mg via ORAL
  Filled 2018-10-27: qty 1

## 2018-10-27 NOTE — Progress Notes (Signed)
PT Cancellation Note  Patient Details Name: Ethan Campbell MRN: [id number] DOB: [birth year]   Cancelled Treatment:    Reason Eval/Treat Not Completed: Patient not medically ready(low Hgb ) and labile BP   Duncan Dull 10/27/2018, 6:46 AM

## 2018-10-27 NOTE — Progress Notes (Signed)
Pharmacy Antibiotic Note  Ethan Campbell is a 58 y.o. male admitted on 10/22/2018 with wound infection.  Pharmacy has been consulted for vancomycin dosing. Also on ceftriaxone 2g IV q24h per MD. Pt had a recent right BKA on 10/04/18 here, and is now s/p AKA on 4/3.. WBC trend down, afebrile. Noted SCr increased from 1.81 to 2.42.  Plan: -Adjust vancomycin to 1000 mg IV Q24 hrs due to renal dysfunction. Goal AUC 400-550. Expected AUC: 480 SCr used: 2.42 -Monitor CBC, renal fx, cultures and clinical progress -Vanc trough/peak once at steady state  Height: 5\' 9"  (175.3 cm) Weight: 283 lb 11.7 oz (128.7 kg) IBW/kg (Calculated) : 70.7  Temp (24hrs), Avg:98.1 F (36.7 C), Min:97.8 F (36.6 C), Max:98.9 F (37.2 C)  Recent Labs  Lab 10/22/18 2057 10/22/18 2337 10/23/18 0323 10/24/18 0523 10/25/18 0721 10/26/18 0800 10/27/18 0102  WBC  --   --  18.2* 11.2* 11.1* 14.3* 11.5*  CREATININE  --   --  2.26* 1.78* 1.69* 1.81* 2.42*  LATICACIDVEN 1.1 1.2  --   --   --   --   --     Estimated Creatinine Clearance: 44.7 mL/min (A) (by C-G formula based on SCr of 2.42 mg/dL (H)).    No Known Allergies  Thank you for allowing pharmacy to be a part of this patient's care.  Leron Croak, PharmD PGY1 Pharmacy Resident  Please check AMION for all [facility] Pharmacy phone numbers 10/27/2018 9:21 AM

## 2018-10-27 NOTE — Progress Notes (Signed)
Lab called with critical Hemoglobin of 5.8. NP on call notified.

## 2018-10-27 NOTE — Progress Notes (Addendum)
Addendum  RN paged earlier this afternoon re transient asymptomatic tachycardia.  Personally reviewed tele monitor:  Noted brief episode of ~ 12 minutes of Aflutter with RVR in 130s b/w 1:12-1:24 pm which spontaneously reverted to CVR vs AV paced rhythm. Continue to monitor closely. Reviewed home med list, not on rate control meds.  Reviewed case with on-call Cardiologist who reviewed patient's chart, echo in 2015 with normal EF, recent pacemaker check was okay.  He recommended starting metoprolol 12.5 mg twice daily, initiated 4/4.  Discussed with RN.  Vernell Leep, MD, FACP, [facility]. [facility]  To contact the attending provider between 7A-7P or the covering provider during after hours 7P-7A, please log into the web site [email or web address] and access using universal [place] password for that web site. If you do not have the password, please call the hospital operator.

## 2018-10-27 NOTE — Progress Notes (Signed)
Pt Bp low, pt still asymptomatic, more pale than earlier. NP on call notified and Rapid response RN called.  Received order for 500 ml bolus of NS and STAT labs ordered as well as type and screen.

## 2018-10-27 NOTE — Progress Notes (Signed)
PROGRESS NOTE    Ethan Campbell  KXF:[id number] DOB: 1961-07-09 DOA: 10/22/2018 PCP: Arsenio Katz, NP   Brief Narrative: Patient is a 58 year old male with history of hypertension, hyperlipidemia, diabetes type 2 on insulin pump, hypothyroidism, depression, left arm DVT on Coumadin, bone cancer, OSA on CPAP, CKD stage III who presented with complaints of right leg wound infection. Patient just had BKA of right lower extremity by orthopedics on 10/04/2018.  Wound VAC was taken down 6 days PTA.  He was also having fever of 103 F and chills at skilled nursing facility.  Patient stated that he had contact with Covid-19 positive health worker at the facility.  Test for COVID-19 sent from SNF on 3/30 and results still pending as of today.  Patient is completely asymptomatic.  Orthopedics consulted, patient underwent right AKA 4/3, postop course overnight of surgery complicated by hypotension, suspected ATN and ABLA, required 1 unit PRBC transfusion.  Assessment & Plan:   Principal Problem:   Wound infection after surgery Active Problems:   Essential hypertension, benign   Acute renal failure superimposed on stage 3 chronic kidney disease (HCC)   HLD (hyperlipidemia)   Below-knee amputation of right lower extremity (HCC)   Uncontrolled type 2 diabetes mellitus with polyneuropathy (HCC)   Normocytic anemia   Sepsis (HCC)   DVT (deep venous thrombosis) ([place])   Hypothyroidism   Close Exposure to Covid-19 Virus   Dehiscence of amputation stump (HCC)   Severe protein-calorie malnutrition ([place])   Suspected sepsis secondary to postop wound infection of the right lower extremity:  History of recent BKA.   Failed outpatient doxycycline treatment. Started on empiric antibiotics, IV ceftriaxone and vancomycin.  Blood cultures x2: Negative to date. Sepsis resolved. S/P right AKA on 4/3 by Dr. Sharol Given.  Has wound VAC in place. I discussed with Dr. Sharol Given who recommends IV antibiotics possibly for 4  weeks due to necrotic adipose tissue.  Weightbearing as tolerated on left lower extremity.  Right lower extremity amputation site infection:  S/P right AKA on 4/3 by Dr. Sharol Given.  Has wound VAC in place. I discussed with Dr. Sharol Given who recommends IV antibiotics possibly for 4 weeks due to necrotic adipose tissue.  Weightbearing as tolerated on left lower extremity.  Suspicion for Covid-19:  Patient stated that he had contact with nursing staff with COVID-19.  Test has been sent.  We will follow-up report.  Patient is completely asymptomatic.  No shortness of breath or cough.   Was on airborne isolation.  Currently on droplet isolation and contact precautions. As per RN verification with [facility], test was sent off on 3/30 and results to date are pending.  Essential hypertension: Lotensin, Lasix held due to worsening renal function.  Continue PRN meds for now.  Reasonable control for now.  Also on midodrine, suspect due to orthostatic hypotension. Overnight of surgery 4/3, developed hypotension with SBP in the 70s.  Resuscitated with IV saline bolus and a unit of PRBC.  Blood pressure has normalized.  AKI on CKD stage III:  Baseline creatinine ranges from 2-2.3.  Presented with mild acute kidney injury.  Acute kidney injury had resolved.  IV fluids were discontinued.  Creatinine fluctuating but still within range.  Creatinine went up from 1.8 preop to 2.4 postop, suspect due to hypotension, possible ATN related to ABLA.  Creatinine seems to have stabilized over the last 12 hours.  Brief IV fluids.  Follow BMP in a.m.  Avoid nephrotoxic's.  Hyperkalemia, mild Potassium was 5.2  last night, likely related to acute kidney injury.  Improved to 5.  Treat acute kidney injury as above.  Low potassium diet.  Type 2 diabetes mellitus/hypoglycemia:  Hemoglobin A1c of 6.9 on 11/19.  Patient had a mild hypoglycemic episode with CBG of 54 on 4/1 while he was still on U5 100 insulin pump, Lantus 5 units  daily and NovoLog SSI. Discussed in detail with diabetes coordinator 4/1 and as per their recommendations, DC'ed insulin pump/U5 100, started Lantus 40 units daily, SSI and mealtime NovoLog.  CBGs mildly uncontrolled and in the low 200s.  Continue current regimen for today and consider adjusting tomorrow.  Hyperlipidemia:  Continue home meds  Postop ABLA complicating chronic normocytic anemia:  Chronic normocytic anemia most likely from the chronic comorbidities.  Possibly anemia of chronic disease.  Hemoglobin dropped from 8 g preop to 5.8 postop.  As discussed with Dr. Sharol Given, mild EBL at surgery. S/P 1 unit PRBC overnight 4/3.  Hemoglobin up to 7.  Follow CBC in a.m. and transfuse if hemoglobin less than 7.  Patient asymptomatic.  History of DVT:  As per patient, he had a solitary episode of left upper extremity DVT approximately 1 year ago.  He is unsure as to why he is on long-term anticoagulation.  He denies history of recurrent DVT, PE or being told of any hypercoagulable status. As per review of cardiology follow-up in care everywhere, history of run of atrial tachycardia versus atrial flutter noted in March 2019.  As per cardiology follow-up 02/21/2018, he was advised to continue warfarin for stroke prophylaxis. As discussed with Dr. Sharol Given, resumed Coumadin but target INR between 2-3.  He had presented with INR of greater than 3.  Follow CBC closely.  Atrial flutter with CVR Management as noted above.  On anticoagulation for this indication.  Follows with cardiology at [place] noted atrial flutter with controlled ventricular rate.  Will resume Coumadin without bridging due to significant anemia.  Hypothyroidism:  Continue Synthyroid  OSA: Uses CPAP at night, currently not getting due to Covid-19 rule out.  Morbid obesity/Body mass index is 41.9 kg/m.   Chronic diastolic CHF Compensated.  Currently off of diuretics.  History of cardiogenic syncope/sick sinus  syndrome As per nephrology note in care everywhere.  He is status post pacemaker 02/14/2017 at [facility].  Seizure disorder Patient reports that he is not on medications for this.  Claims that his last episode was prior to last surgery in early March 2020.  Does not drive.  History of orthostatic hypotension Remains on midodrine.  Followed at [facility].      PPE: Patient: None.  Provider: Head cover,CAPR, gown, gloves and shoe cover.  Currently on droplet and contact precautions. DVT prophylaxis: Anticoagulated on Coumadin, held for couple days now for surgery. Code Status: Full code Family Communication: None present at the bedside Disposition Plan: To be determined pending surgery and clinical improvement.  He presented from SNF.?  Return to SNF.   Consultants:  Orthopedics  Procedures:  Right AKA with wound VAC placement on 4/3.  Antimicrobials:  Anti-infectives (From admission, onward)   Start     Dose/Rate Route Frequency Ordered Stop   10/27/18 2100  vancomycin (VANCOCIN) 1,000 mg in sodium chloride 0.9 % 250 mL IVPB     1,000 mg 250 mL/hr over 60 Minutes Intravenous Every 24 hours 10/27/18 0923     10/26/18 0700  ceFAZolin (ANCEF) IVPB 2g/100 mL premix     2 g  200 mL/hr over 30 Minutes Intravenous To [facility] 10/25/18 1959 10/26/18 1708   10/24/18 2100  vancomycin (VANCOCIN) 1,250 mg in sodium chloride 0.9 % 250 mL IVPB  Status:  Discontinued     1,250 mg 166.7 mL/hr over 90 Minutes Intravenous Every 24 hours 10/24/18 0936 10/27/18 0923   10/24/18 0800  vancomycin (VANCOCIN) 1,500 mg in sodium chloride 0.9 % 500 mL IVPB  Status:  Discontinued     1,500 mg 250 mL/hr over 120 Minutes Intravenous Every 36 hours 10/22/18 1917 10/23/18 0919   10/23/18 2100  vancomycin (VANCOCIN) 1,000 mg in sodium chloride 0.9 % 250 mL IVPB  Status:  Discontinued     1,000 mg 250 mL/hr over 60 Minutes Intravenous Every 24 hours 10/23/18 0919 10/24/18 0936    10/23/18 0000  cefTRIAXone (ROCEPHIN) 2 g in sodium chloride 0.9 % 100 mL IVPB     2 g 200 mL/hr over 30 Minutes Intravenous Every 24 hours 10/22/18 2345     10/22/18 1930  ceFEPIme (MAXIPIME) 2 g in sodium chloride 0.9 % 100 mL IVPB  Status:  Discontinued     2 g 200 mL/hr over 30 Minutes Intravenous Every 24 hours 10/22/18 1917 10/22/18 2345   10/22/18 1930  vancomycin (VANCOCIN) 2,500 mg in sodium chloride 0.9 % 500 mL IVPB     2,500 mg 250 mL/hr over 120 Minutes Intravenous  Once 10/22/18 1917 10/23/18 1905      Subjective: Overnight events noted.  Had some issues with tachycardia and hypotension.  Reports mild appropriate postop right lower extremity pain.  No chest pain, dyspnea, dizziness, lightheadedness or palpitations reported.  Patient interviewed along with RN in room.  Objective: Vitals:   10/27/18 0326 10/27/18 0341 10/27/18 0641 10/27/18 0739  BP: (!) 139/25   (!) 146/47  Pulse: 69   97  Resp: 12   14  Temp:  97.8 F (36.6 C) 98.9 F (37.2 C) 98.1 F (36.7 C)  TempSrc:  Oral Oral Oral  SpO2: 99%   99%  Weight:      Height:        Intake/Output Summary (Last 24 hours) at 10/27/2018 1256 Last data filed at 10/27/2018 5456 Gross per 24 hour  Intake 2290.66 ml  Output 750 ml  Net 1540.66 ml   Filed Weights   10/22/18 1627 10/22/18 2350  Weight: (!) 140.2 kg 128.7 kg    Examination:  General exam: Pleasant young male, moderately built and morbidly obese lying comfortably propped up in bed.  Oral mucosa moist. Respiratory system: Clear to auscultation.  No increased work of breathing. Cardiovascular system: S1 and S2 heard, irregularly irregular.  No JVD, murmurs or pedal edema.  Telemetry personally reviewed: Atrial flutter with controlled ventricular rate and at times on demand V paced rhythm. Gastrointestinal system: Abdomen is nondistended, soft and nontender. No organomegaly or masses felt. Normal bowel sounds heard.  Stable. Central nervous system: Alert  and oriented. No focal neurological deficits.  Stable. Extremities: Right AKA with wound VAC in place and intact without acute findings.  Symmetric 5 x 5 power. Skin: No rashes, lesions or ulcers,no icterus ,no pallor   Data Reviewed: I have personally reviewed following labs and imaging studies  CBC: Recent Labs  Lab 10/22/18 1652  10/24/18 0523 10/25/18 0721 10/26/18 0800 10/27/18 0102 10/27/18 1113  WBC 18.9*   < > 11.2* 11.1* 14.3* 11.5* 14.4*  NEUTROABS 14.9*  --  8.7*  --   --   --   --  HGB 8.9*   < > 9.1* 8.0* 8.0* 5.8* 7.0*  HCT 30.2*   < > 30.1* 25.7* 25.4* 19.1* 21.7*  MCV 87.3   < > 88.8 85.4 84.7 86.8 85.1  PLT 680*   < > 472* 558* 541* 442* 537*   < > = values in this interval not displayed.   Basic Metabolic Panel: Recent Labs  Lab 10/24/18 0523 10/25/18 0721 10/26/18 0800 10/27/18 0102 10/27/18 1113  NA 139 133* 134* 132* 130*  K 4.3 4.2 4.6 5.2* 5.0  CL 105 102 102 102 98  CO2 21* 26 25 21* 23  GLUCOSE 53* 222* 214* 241* 271*  BUN 54* 47* 51* 62* 69*  CREATININE 1.78* 1.69* 1.81* 2.42* 2.54*  CALCIUM 8.6* 8.1* 8.3* 7.8* 7.9*   GFR: Estimated Creatinine Clearance: 42.6 mL/min (A) (by C-G formula based on SCr of 2.54 mg/dL (H)). Liver Function Tests: Recent Labs  Lab 10/22/18 1652  AST 41  ALT 29  ALKPHOS 108  BILITOT 0.7  PROT 7.8  ALBUMIN 2.4*   Coagulation Profile: Recent Labs  Lab 10/23/18 0323 10/24/18 0948 10/25/18 0721 10/26/18 0800 10/27/18 0102  INR 3.2* 3.1* 2.2* 1.8* 1.7*   CBG: Recent Labs  Lab 10/26/18 0903 10/26/18 1721 10/26/18 2137 10/26/18 2152 10/27/18 0738  GLUCAP 188* 209* 211* 201* 240*     Recent Results (from the past 240 hour(s))  Blood culture (routine x 2)     Status: None   Collection Time: 10/22/18  5:00 PM  Result Value Ref Range Status   Specimen Description BLOOD RIGHT ANTECUBITAL  Final   Special Requests   Final    BOTTLES DRAWN AEROBIC AND ANAEROBIC Blood Culture results may not be  optimal due to an excessive volume of blood received in culture bottles   Culture   Final    NO GROWTH 5 DAYS Performed at [facility], [place] [address], [place], [place] [address]    Report Status 10/27/2018 FINAL  Final  Blood culture (routine x 2)     Status: None (Preliminary result)   Collection Time: 10/22/18  5:10 PM  Result Value Ref Range Status   Specimen Description BLOOD LEFT ANTECUBITAL  Final   Special Requests   Final    BOTTLES DRAWN AEROBIC AND ANAEROBIC Blood Culture results may not be optimal due to an excessive volume of blood received in culture bottles   Culture   Final    NO GROWTH 4 DAYS Performed at [facility], [place] [address] [place], [place] [address]    Report Status PENDING  Incomplete         Radiology Studies: No results found.      Scheduled Meds: . aspirin EC  81 mg Oral Daily  . atorvastatin  80 mg Oral Daily  . docusate sodium  100 mg Oral BID  . DULoxetine  60 mg Oral Daily  . feeding supplement (PRO-STAT SUGAR FREE 64)  30 mL Oral TID  . fenofibrate  160 mg Oral Daily  . FLUoxetine  20 mg Oral Daily  . gabapentin  300 mg Oral BID  . insulin aspart  0-15 Units Subcutaneous TID WC  . insulin aspart  0-5 Units Subcutaneous QHS  . insulin aspart  6 Units Subcutaneous TID WC  . insulin glargine  40 Units Subcutaneous QHS  . levothyroxine  100 mcg Oral Daily  . midodrine  5 mg Oral TID WC  . multivitamin with minerals  1 tablet  Oral Daily  . tamsulosin  0.4 mg Oral Daily  . tiZANidine  4 mg Oral BID  . vitamin C  500 mg Oral BID  . zinc sulfate  220 mg Oral Daily   Continuous Infusions: . sodium chloride 10 mL/hr at 10/26/18 1743  . cefTRIAXone (ROCEPHIN)  IV 2 g (10/26/18 2334)  . methocarbamol (ROBAXIN) IV    . vancomycin       LOS: 5 days     Vernell Leep, MD, FACP, [facility]. [facility]  To contact the attending provider between 7A-7P or the covering provider during after hours 7P-7A, please log into  the web site [email or web address] and access using universal [place] password for that web site. If you do not have the password, please call the hospital operator.

## 2018-10-27 NOTE — Progress Notes (Signed)
Patient ID: Ethan Campbell, male   DOB: [birth year], 58 y.o.   MRN: [id number] Patient is postoperative day 1 right above-the-knee amputation.  Patient did have discoloration of the adipose tissue the muscle appeared very healthy there is no necrotic fascia.  There is no drainage in the wound VAC canister.  Patient will need to continue with IV antibiotics.  Patient is on droplet precautions now and not on full respiratory precautions.

## 2018-10-27 NOTE — Progress Notes (Signed)
ANTICOAGULATION CONSULT NOTE - Initial Consult  Pharmacy Consult for warfarin Indication: aflutter and history of DVT  No Known Allergies  Patient Measurements: Height: 5\' 9"  (175.3 cm) Weight: 283 lb 11.7 oz (128.7 kg) IBW/kg (Calculated) : 70.7 Heparin Dosing Weight: 103.9 kg  Vital Signs: Temp: 98.1 F (36.7 C) (04/04 0739) Temp Source: Oral (04/04 0739) BP: 146/47 (04/04 0739) Pulse Rate: 97 (04/04 0739)  Labs: Recent Labs    10/25/18 0721 10/26/18 0800 10/27/18 0102 10/27/18 1113  HGB 8.0* 8.0* 5.8* 7.0*  HCT 25.7* 25.4* 19.1* 21.7*  PLT 558* 541* 442* 537*  LABPROT 24.4* 20.2* 19.3*  --   INR 2.2* 1.8* 1.7*  --   CREATININE 1.69* 1.81* 2.42* 2.54*    Estimated Creatinine Clearance: 42.6 mL/min (A) (by C-G formula based on SCr of 2.54 mg/dL (H)).   Medical History: Past Medical History:  Diagnosis Date  . Arm vein blood clot, left    on Coumadin  . Arthritis   . Cancer ([place])    Bone cancer 1987 (in knee Large cell tumor)  . Chronic kidney disease    stage 3  . Depression   . Essential hypertension   . History of stroke   . Hyperlipidemia   . Hypothyroidism   . Insomnia   . Obesity   . Precordial pain June 2011   Nuclear stress; no ischemia; EF 60%  . Presence of permanent cardiac pacemaker   . Proteinuria   . Seizure disorder ([place])   . Sick sinus syndrome ([place])   . Sleep apnea    Dr. Brandon Melnick, uses bipap  . Syncope   . Type 2 diabetes mellitus ([place])   . Venous insufficiency     Assessment: 11 yoM on warfarin PTA for atrial flutter/history of DVT. Patient is s/p AKA on 4/3. Pharmacy has been consulted to resume patient's warfarin. On 3/31 patient's INR supratherapeutic of 3.2. Patient's last dose of warfarin was on 3/29. Today patient's INR is subtherapeutic at 1.7. Patient with low Hgb of 5.8 this AM requiring 1 units PRBC, HCT 19.1, and pltc 442. Repeat CBC shows improvement in Hgb to 7.0.  PTA dose: warfarin 10 mg PO every MWF, and 7.5 mg  PO on all other days of the week  Goal of Therapy:  INR 2-3 Monitor platelets by anticoagulation protocol: Yes   Plan:  Warfarin 7.5 mg PO x 1 tonight Monitor daily INR Monitor for s/sx of bleeding  Thank you for allowing pharmacy to be a part of this patient's care.  Leron Croak, PharmD PGY1 Pharmacy Resident  Please check AMION for all [facility] phone numbers 10/27/2018,1:17 PM

## 2018-10-27 NOTE — Progress Notes (Signed)
Pt BP 70/37, MAP 44, other VS stable, pt asymptomatic. NP on call notified. Will continue to monitor pt closely.

## 2018-10-27 NOTE — Progress Notes (Addendum)
CSW acknowledges consult and was already aware that the patient needed skilled nursing. He is from [facility].   Per reading the assessment completed by CSW Shelton Silvas, the patient would be willing to back if necessary. Patient is able to return once medically stable.   Patient will need updated PT/OT evaluations.   CSW will continue to follow and assist with DC planning.   Domenic Schwab, MSW, [place]

## 2018-10-28 DIAGNOSIS — I4892 Unspecified atrial flutter: Secondary | ICD-10-CM

## 2018-10-28 LAB — BASIC METABOLIC PANEL
Anion gap: 8 (ref 5–15)
BUN: 75 mg/dL — ABNORMAL HIGH (ref 6–20)
CO2: 24 mmol/L (ref 22–32)
Calcium: 7.9 mg/dL — ABNORMAL LOW (ref 8.9–10.3)
Chloride: 98 mmol/L (ref 98–111)
Creatinine, Ser: 2.2 mg/dL — ABNORMAL HIGH (ref 0.61–1.24)
GFR calc Af Amer: 37 mL/min — ABNORMAL LOW (ref >60)
GFR calc non Af Amer: 32 mL/min — ABNORMAL LOW (ref >60)
Glucose, Bld: 217 mg/dL — ABNORMAL HIGH (ref 70–99)
Potassium: 4.6 mmol/L (ref 3.5–5.1)
Sodium: 130 mmol/L — ABNORMAL LOW (ref 135–145)

## 2018-10-28 LAB — CBC
HCT: 18.9 % — ABNORMAL LOW (ref 39.0–52.0)
Hemoglobin: 6 g/dL — CL (ref 13.0–17.0)
MCH: 27.1 pg (ref 26.0–34.0)
MCHC: 31.7 g/dL (ref 30.0–36.0)
MCV: 85.5 fL (ref 80.0–100.0)
Platelets: 505 10*3/uL — ABNORMAL HIGH (ref 150–400)
RBC: 2.21 MIL/uL — ABNORMAL LOW (ref 4.22–5.81)
RDW: 15 % (ref 11.5–15.5)
WBC: 12.8 10*3/uL — ABNORMAL HIGH (ref 4.0–10.5)
nRBC: 0 % (ref 0.0–0.2)

## 2018-10-28 LAB — GLUCOSE, CAPILLARY
Glucose-Capillary: 214 mg/dL — ABNORMAL HIGH (ref 70–99)
Glucose-Capillary: 216 mg/dL — ABNORMAL HIGH (ref 70–99)
Glucose-Capillary: 159 mg/dL — ABNORMAL HIGH (ref 70–99)
Glucose-Capillary: 194 mg/dL — ABNORMAL HIGH (ref 70–99)

## 2018-10-28 LAB — PREPARE RBC (CROSSMATCH)

## 2018-10-28 LAB — PROTIME-INR
INR: 1.6 — ABNORMAL HIGH (ref 0.8–1.2)
Prothrombin Time: 18.4 seconds — ABNORMAL HIGH (ref 11.4–15.2)

## 2018-10-28 MED ORDER — SODIUM CHLORIDE 0.9 % IV SOLN
INTRAVENOUS | Status: DC
  Administered 2018-10-28: 18:00:00 via INTRAVENOUS

## 2018-10-28 MED ORDER — INSULIN ASPART 100 UNIT/ML ~~LOC~~ SOLN
8.0000 [IU] | Freq: Three times a day (TID) | SUBCUTANEOUS | Status: DC
  Administered 2018-10-28 – 2018-10-31 (×11): 8 [IU] via SUBCUTANEOUS

## 2018-10-28 MED ORDER — SODIUM CHLORIDE 0.9% IV SOLUTION
Freq: Once | INTRAVENOUS | Status: AC
  Administered 2018-10-28: 13:00:00 via INTRAVENOUS

## 2018-10-28 MED ORDER — WARFARIN SODIUM 7.5 MG PO TABS
7.5000 mg | ORAL_TABLET | Freq: Once | ORAL | Status: AC
  Administered 2018-10-28: 7.5 mg via ORAL
  Filled 2018-10-28: qty 1

## 2018-10-28 NOTE — Progress Notes (Signed)
PT Cancellation Note  Patient Details Name: Ethan Campbell MRN: [id number] DOB: 01-Mar-1961   Cancelled Treatment:    Reason Eval/Treat Not Completed: Patient not medically ready. PT eval received, chart reviewed. Pt with low Hg this morning at 6.0 g/dL, and INR remains subtherapeutic at 1.6. Will continue to follow and initiate PT eval when pt is medically ready to participate.    Thelma Comp 10/28/2018, 1:20 PM   Rolinda Roan, PT, DPT Acute Rehabilitation Services Pager: [phone] Office: [phone]

## 2018-10-28 NOTE — Progress Notes (Signed)
PROGRESS NOTE    Ethan Campbell  LOV:[id number] DOB: [birth year] DOA: 10/22/2018 PCP: Arsenio Katz, NP   Brief Narrative: Patient is a 58 year old male with history of hypertension, hyperlipidemia, diabetes type 2 on insulin pump, hypothyroidism, depression, left arm DVT on Coumadin, bone cancer, OSA on CPAP, CKD stage III who presented with complaints of right leg wound infection. Patient just had BKA of right lower extremity by orthopedics on 10/04/2018.  Wound VAC was taken down 6 days PTA.  He was also having fever of 103 F and chills at skilled nursing facility.  Patient stated that he had contact with Covid-19 positive health worker at the facility.  Test for COVID-19 sent from SNF on 3/30 and results still pending as of today.  Patient is completely asymptomatic.  Orthopedics consulted, patient underwent right AKA 4/3, postop course complicated by hypotension (resolved), suspected ATN and anemia requiring transfusion.  Assessment & Plan:   Principal Problem:   Wound infection after surgery Active Problems:   Essential hypertension, benign   Acute renal failure superimposed on stage 3 chronic kidney disease (HCC)   HLD (hyperlipidemia)   Below-knee amputation of right lower extremity (HCC)   Uncontrolled type 2 diabetes mellitus with polyneuropathy (HCC)   Normocytic anemia   Sepsis (HCC)   DVT (deep venous thrombosis) ([place])   Hypothyroidism   Close Exposure to Covid-19 Virus   Dehiscence of amputation stump (HCC)   Severe protein-calorie malnutrition ([place])   Suspected sepsis secondary to postop wound infection of the right lower extremity:  History of recent BKA.   Failed outpatient doxycycline treatment. Started on empiric antibiotics, IV ceftriaxone and vancomycin.  Blood cultures x2: Negative, final report. Sepsis resolved. S/P right AKA on 4/3 by Dr. Sharol Given.  Has wound VAC in place. I discussed with Dr. Sharol Given 4/5 who recommends IV antibiotics through course of  hospitalization due to necrotic adipose tissue and upon discharge oral doxycycline for 4 weeks.  Weightbearing as tolerated on left lower extremity.  He indicated that wound VAC is dry and does not have any blood.  Right lower extremity amputation site infection:  S/P right AKA on 4/3 by Dr. Sharol Given.  Has wound VAC in place. Management as indicated above.  Suspicion for Covid-19:  Patient stated that he had contact with nursing staff at [facility] in [place] with COVID-19.  Test was sent off on 3/30 and results are still pending.  Patient has been asymptomatic for several days.  Hopefully will have results back soon.  Essential hypertension: Lotensin and Lasix on hold due to acute kidney injury.  Transient postop hypotension has resolved after IV crystalloid bolus and blood transfusion.  Metoprolol 12.5 mg twice daily started due to atrial flutter with RVR.  Also on PRN IV hydralazine.  Controlled.  AKI on CKD stage III:  Baseline creatinine ranges from 2-2.3.  Presented with mild acute kidney injury.  Acute kidney injury had resolved.  IV fluids were discontinued.  Creatinine went up from 1.8 preop to 2.4 postop, suspect due to hypotension, possible ATN related to ABLA.  Creatinine is improved from 2.4-2.2.  Transfusing 2 units PRBC and continue gentle IV fluids for additional 24 hours and follow BMP in a.m.  Hyperkalemia, mild Resolved.  Type 2 diabetes mellitus/hypoglycemia:  Hemoglobin A1c of 6.9 on 11/19.  Patient had a mild hypoglycemic episode with CBG of 54 on 4/1 while he was still on U5 100 insulin pump, Lantus 5 units daily and NovoLog SSI. Discussed in detail with diabetes  coordinator 4/1 and as per their recommendations, DC'ed insulin pump/U5 100, started Lantus 40 units daily, SSI and mealtime NovoLog.  CBGs mildly uncontrolled and in the low 200s.  Continue current regimen for today and consider adjusting tomorrow.  Mildly uncontrolled.  Adjusted mealtime NovoLog.   Hyperlipidemia:  Continue home meds  Postop ABLA complicating chronic normocytic anemia/suspect anemia of chronic disease and chronic kidney disease.:  Hemoglobin dropped from 8 g preop to 5.8 postop.  Posttransfusion hemoglobin had improved to 7 but dropped again to 6.  Transfusing additional 2 units of PRBCs. Patient denies symptoms of any overt bleeding.  Follow CBC in a.m.  History of DVT:  As per patient, he had a solitary episode of left upper extremity DVT approximately 1 year ago.  He is unsure as to why he is on long-term anticoagulation.  He denies history of recurrent DVT, PE or being told of any hypercoagulable status. As per review of cardiology follow-up in care everywhere, history of run of atrial tachycardia versus atrial flutter noted in March 2019.  As per cardiology follow-up 02/21/2018, he was advised to continue warfarin for stroke prophylaxis. As discussed with Dr. Sharol Given, resumed Coumadin but target INR between 2-3.  He had presented with INR of greater than 3.  Follow CBC closely.  INR subtherapeutic/1.6.  Coumadin per pharmacy.  Atrial flutter with RVR On anticoagulation for this indication.  Follows with cardiology at [place] atrial flutter with intermittent RVR on 4/4.  Reviewed case with cardiology.  Echo in 2015 with normal EF.  Recent pacemaker check was normal. Started metoprolol 12.5 mg twice daily.  Controlled.  Hypothyroidism:  Continue Synthyroid  OSA: Uses CPAP at night, currently not getting due to Covid-19 rule out.  Morbid obesity/Body mass index is 41.9 kg/m.   Chronic diastolic CHF Remains clinically compensated.  Currently off of diuretics.  History of cardiogenic syncope/sick sinus syndrome As per nephrology note in care everywhere.  He is status post pacemaker 02/14/2017 at [facility].  Seizure disorder Patient reports that he is not on medications for this.  Claims that his last episode was prior to last surgery  in early March 2020.  Does not drive.  History of orthostatic hypotension Remains on midodrine.  Follow[facility] at [facility].      PPE: Patient: None.  Provider: Head cover,CAPR, gown, gloves and shoe cover.  Currently on droplet and contact precautions. DVT prophylaxis: Resumed Coumadin per pharmacy.  Added subcutaneous heparin DVT prophylactic dose until INR therapeutic. Code Status: Full code Family Communication: None present at the bedside Disposition Plan: To be determined pending surgery and clinical improvement.  He presented from SNF.?  Return to SNF.   Consultants:  Orthopedics  Procedures:  Right AKA with wound VAC placement on 4/3.  Antimicrobials:  Anti-infectives (From admission, onward)   Start     Dose/Rate Route Frequency Ordered Stop   10/27/18 2100  vancomycin (VANCOCIN) 1,000 mg in sodium chloride 0.9 % 250 mL IVPB     1,000 mg 250 mL/hr over 60 Minutes Intravenous Every 24 hours 10/27/18 0923     10/26/18 0700  ceFAZolin (ANCEF) IVPB 2g/100 mL premix     2 g 200 mL/hr over 30 Minutes Intravenous To [facility] 10/25/18 1959 10/26/18 1708   10/24/18 2100  vancomycin (VANCOCIN) 1,250 mg in sodium chloride 0.9 % 250 mL IVPB  Status:  Discontinued     1,250 mg 166.7 mL/hr over 90 Minutes Intravenous Every 24 hours 10/24/18  6967 10/27/18 0923   10/24/18 0800  vancomycin (VANCOCIN) 1,500 mg in sodium chloride 0.9 % 500 mL IVPB  Status:  Discontinued     1,500 mg 250 mL/hr over 120 Minutes Intravenous Every 36 hours 10/22/18 1917 10/23/18 0919   10/23/18 2100  vancomycin (VANCOCIN) 1,000 mg in sodium chloride 0.9 % 250 mL IVPB  Status:  Discontinued     1,000 mg 250 mL/hr over 60 Minutes Intravenous Every 24 hours 10/23/18 0919 10/24/18 0936   10/23/18 0000  cefTRIAXone (ROCEPHIN) 2 g in sodium chloride 0.9 % 100 mL IVPB     2 g 200 mL/hr over 30 Minutes Intravenous Every 24 hours 10/22/18 2345     10/22/18 1930  ceFEPIme (MAXIPIME) 2 g in sodium  chloride 0.9 % 100 mL IVPB  Status:  Discontinued     2 g 200 mL/hr over 30 Minutes Intravenous Every 24 hours 10/22/18 1917 10/22/18 2345   10/22/18 1930  vancomycin (VANCOCIN) 2,500 mg in sodium chloride 0.9 % 500 mL IVPB     2,500 mg 250 mL/hr over 120 Minutes Intravenous  Once 10/22/18 1917 10/23/18 1905      Subjective: Mild appropriate postop right lower extremity pain.  No chest pain, dyspnea, palpitations, dizziness or lightheadedness.  Denies any overt bleeding.  Had BM yesterday with normal colored stools.  No black stools or blood in stools.  Objective: Vitals:   10/28/18 0300 10/28/18 0820 10/28/18 0900 10/28/18 1100  BP: (!) 118/56 130/61    Pulse: 92 89 77 73  Resp: (!) 22 18    Temp: 98.9 F (37.2 C) 98.7 F (37.1 C)    TempSrc: Oral Oral    SpO2: 97% 97% 94% 98%  Weight:      Height:        Intake/Output Summary (Last 24 hours) at 10/28/2018 1123 Last data filed at 10/27/2018 2100 Gross per 24 hour  Intake 1300 ml  Output 900 ml  Net 400 ml   Filed Weights   10/22/18 1627 10/22/18 2350  Weight: (!) 140.2 kg 128.7 kg    Examination:  General exam: Pleasant young male, moderately built and morbidly obese lying comfortably propped up in bed.  Oral mucosa moist.  Did not appear in any distress. Respiratory system: Clear to auscultation.  No increased work of breathing.  Stable. Cardiovascular system: S1 and S2 heard, irregularly irregular.  No JVD, murmurs or pedal edema.  Telemetry personally reviewed: SR/on demand V paced rhythm/paroxysmal atrial flutter/fib with controlled ventricular rate. Gastrointestinal system: Abdomen is nondistended, soft and nontender. No organomegaly or masses felt. Normal bowel sounds heard.  Stable Central nervous system: Alert and oriented. No focal neurological deficits.  Stable. Extremities: Right AKA with wound VAC in place and intact without acute findings.  Symmetric 5 x 5 power. Skin: No rashes, lesions or ulcers,no icterus  ,no pallor   Data Reviewed: I have personally reviewed following labs and imaging studies  CBC: Recent Labs  Lab 10/22/18 1652  10/24/18 0523 10/25/18 0721 10/26/18 0800 10/27/18 0102 10/27/18 1113 10/28/18 0858  WBC 18.9*   < > 11.2* 11.1* 14.3* 11.5* 14.4* 12.8*  NEUTROABS 14.9*  --  8.7*  --   --   --   --   --   HGB 8.9*   < > 9.1* 8.0* 8.0* 5.8* 7.0* 6.0*  HCT 30.2*   < > 30.1* 25.7* 25.4* 19.1* 21.7* 18.9*  MCV 87.3   < > 88.8 85.4 84.7 86.8 85.1 85.5  PLT 680*   < > 472* 558* 541* 442* 537* 505*   < > = values in this interval not displayed.   Basic Metabolic Panel: Recent Labs  Lab 10/25/18 0721 10/26/18 0800 10/27/18 0102 10/27/18 1113 10/28/18 0858  NA 133* 134* 132* 130* 130*  K 4.2 4.6 5.2* 5.0 4.6  CL 102 102 102 98 98  CO2 26 25 21* 23 24  GLUCOSE 222* 214* 241* 271* 217*  BUN 47* 51* 62* 69* 75*  CREATININE 1.69* 1.81* 2.42* 2.54* 2.20*  CALCIUM 8.1* 8.3* 7.8* 7.9* 7.9*   GFR: Estimated Creatinine Clearance: 49.2 mL/min (A) (by C-G formula based on SCr of 2.2 mg/dL (H)). Liver Function Tests: Recent Labs  Lab 10/22/18 1652  AST 41  ALT 29  ALKPHOS 108  BILITOT 0.7  PROT 7.8  ALBUMIN 2.4*   Coagulation Profile: Recent Labs  Lab 10/24/18 0948 10/25/18 0721 10/26/18 0800 10/27/18 0102 10/28/18 0858  INR 3.1* 2.2* 1.8* 1.7* 1.6*   CBG: Recent Labs  Lab 10/27/18 0738 10/27/18 1320 10/27/18 1731 10/27/18 2134 10/28/18 0810  GLUCAP 240* 241* 206* 182* 214*     Recent Results (from the past 240 hour(s))  Blood culture (routine x 2)     Status: None   Collection Time: 10/22/18  5:00 PM  Result Value Ref Range Status   Specimen Description BLOOD RIGHT ANTECUBITAL  Final   Special Requests   Final    BOTTLES DRAWN AEROBIC AND ANAEROBIC Blood Culture results may not be optimal due to an excessive volume of blood received in culture bottles   Culture   Final    NO GROWTH 5 DAYS Performed at [facility], [place] [address]  [place], [place] [address]    Report Status 10/27/2018 FINAL  Final  Blood culture (routine x 2)     Status: None   Collection Time: 10/22/18  5:10 PM  Result Value Ref Range Status   Specimen Description BLOOD LEFT ANTECUBITAL  Final   Special Requests   Final    BOTTLES DRAWN AEROBIC AND ANAEROBIC Blood Culture results may not be optimal due to an excessive volume of blood received in culture bottles   Culture   Final    NO GROWTH 5 DAYS Performed at [facility], [place] [address] [place], [place] [address]    Report Status 10/27/2018 FINAL  Final         Radiology Studies: No results found.      Scheduled Meds: . sodium chloride   Intravenous Once  . aspirin EC  81 mg Oral Daily  . atorvastatin  80 mg Oral Daily  . docusate sodium  100 mg Oral BID  . DULoxetine  60 mg Oral Daily  . feeding supplement (PRO-STAT SUGAR FREE 64)  30 mL Oral TID  . fenofibrate  160 mg Oral Daily  . FLUoxetine  20 mg Oral Daily  . gabapentin  300 mg Oral BID  . insulin aspart  0-15 Units Subcutaneous TID WC  . insulin aspart  0-5 Units Subcutaneous QHS  . insulin aspart  8 Units Subcutaneous TID WC  . insulin glargine  40 Units Subcutaneous QHS  . levothyroxine  100 mcg Oral Daily  . metoprolol tartrate  12.5 mg Oral BID  . midodrine  5 mg Oral TID WC  . multivitamin with minerals  1 tablet Oral Daily  . tamsulosin  0.4 mg Oral Daily  . tiZANidine  4 mg Oral BID  .  vitamin C  500 mg Oral BID  . Warfarin - Pharmacist Dosing Inpatient   Does not apply q1800  . zinc sulfate  220 mg Oral Daily   Continuous Infusions: . sodium chloride    . cefTRIAXone (ROCEPHIN)  IV 2 g (10/28/18 0010)  . methocarbamol (ROBAXIN) IV    . vancomycin 1,000 mg (10/27/18 2152)     LOS: 6 days     Vernell Leep, MD, FACP, [facility]. [facility]  To contact the attending provider between 7A-7P or the covering provider during after hours 7P-7A, please log into the web site [email or web address] and access  using universal [place] password for that web site. If you do not have the password, please call the hospital operator.

## 2018-10-28 NOTE — Progress Notes (Signed)
Patient ID: Ethan Campbell, male   DOB: 1960/10/03, 58 y.o.   MRN: [id number] Postoperative day 2 right above-the-knee amputation. Still no drainage in the wound VAC canister.  Patient remains asymptomatic.  Patient's surgical wound margins were clear I would continue to his IV antibiotics throughout his remaining hospital stay and then transition to oral doxycycline 100 mg twice a day for 4 weeks at discharge.

## 2018-10-28 NOTE — Progress Notes (Signed)
ANTICOAGULATION CONSULT NOTE - Initial Consult  Pharmacy Consult for warfarin Indication: aflutter and history of DVT  No Known Allergies  Patient Measurements: Height: 5\' 9"  (175.3 cm) Weight: 283 lb 11.7 oz (128.7 kg) IBW/kg (Calculated) : 70.7 Heparin Dosing Weight: 103.9 kg  Vital Signs: Temp: 98.7 F (37.1 C) (04/05 0820) Temp Source: Oral (04/05 0820) BP: 130/61 (04/05 0820) Pulse Rate: 73 (04/05 1100)  Labs: Recent Labs    10/26/18 0800 10/27/18 0102 10/27/18 1113 10/28/18 0858  HGB 8.0* 5.8* 7.0* 6.0*  HCT 25.4* 19.1* 21.7* 18.9*  PLT 541* 442* 537* 505*  LABPROT 20.2* 19.3*  --  18.4*  INR 1.8* 1.7*  --  1.6*  CREATININE 1.81* 2.42* 2.54* 2.20*    Estimated Creatinine Clearance: 49.2 mL/min (A) (by C-G formula based on SCr of 2.2 mg/dL (H)).   Medical History: Past Medical History:  Diagnosis Date  . Arm vein blood clot, left    on Coumadin  . Arthritis   . Cancer ([place])    Bone cancer 1987 (in knee Large cell tumor)  . Chronic kidney disease    stage 3  . Depression   . Essential hypertension   . History of stroke   . Hyperlipidemia   . Hypothyroidism   . Insomnia   . Obesity   . Precordial pain June 2011   Nuclear stress; no ischemia; EF 60%  . Presence of permanent cardiac pacemaker   . Proteinuria   . Seizure disorder ([place])   . Sick sinus syndrome ([place])   . Sleep apnea    Dr. Brandon Melnick, uses bipap  . Syncope   . Type 2 diabetes mellitus ([place])   . Venous insufficiency     Assessment: 26 yoM on warfarin PTA for atrial flutter/history of DVT. Patient is s/p AKA on 4/3. Pharmacy has been consulted to resume patient's warfarin.  Patient's INR remains subtherapeutic at 1.6. Patient with low Hgb of 6 this AM requiring 2 units PRBC, HCT 18.9, and pltc 505. No bleeding noted.    PTA dose: warfarin 10 mg PO every MWF, and 7.5 mg PO on all other days of the week  Goal of Therapy:  INR 2-3 Monitor platelets by anticoagulation protocol: Yes    Plan:  Warfarin 7.5 mg PO x 1 tonight Monitor daily INR Monitor for s/sx of bleeding  Thank you for allowing pharmacy to be a part of this patient's care.  Leron Croak, PharmD PGY1 Pharmacy Resident  Please check AMION for all [facility] Pharmacy phone numbers 10/28/2018,11:45 AM

## 2018-10-29 ENCOUNTER — Encounter (HOSPITAL_COMMUNITY): Payer: Self-pay | Admitting: Orthopedic Surgery

## 2018-10-29 LAB — RETICULOCYTES
Immature Retic Fract: 42.4 % — ABNORMAL HIGH (ref 2.3–15.9)
RBC.: 2.26 MIL/uL — ABNORMAL LOW (ref 4.22–5.81)
Retic Count, Absolute: 62.8 10*3/uL (ref 19.0–186.0)
Retic Ct Pct: 2.8 % (ref 0.4–3.1)

## 2018-10-29 LAB — BASIC METABOLIC PANEL
Anion gap: 5 (ref 5–15)
BUN: 72 mg/dL — ABNORMAL HIGH (ref 6–20)
CO2: 25 mmol/L (ref 22–32)
Calcium: 8 mg/dL — ABNORMAL LOW (ref 8.9–10.3)
Chloride: 99 mmol/L (ref 98–111)
Creatinine, Ser: 1.9 mg/dL — ABNORMAL HIGH (ref 0.61–1.24)
GFR calc Af Amer: 44 mL/min — ABNORMAL LOW (ref >60)
GFR calc non Af Amer: 38 mL/min — ABNORMAL LOW (ref >60)
Glucose, Bld: 213 mg/dL — ABNORMAL HIGH (ref 70–99)
Potassium: 5.3 mmol/L — ABNORMAL HIGH (ref 3.5–5.1)
Sodium: 129 mmol/L — ABNORMAL LOW (ref 135–145)

## 2018-10-29 LAB — CBC
HCT: 20.3 % — ABNORMAL LOW (ref 39.0–52.0)
Hemoglobin: 6.4 g/dL — CL (ref 13.0–17.0)
MCH: 26.7 pg (ref 26.0–34.0)
MCHC: 31.5 g/dL (ref 30.0–36.0)
MCV: 84.6 fL (ref 80.0–100.0)
Platelets: 510 10*3/uL — ABNORMAL HIGH (ref 150–400)
RBC: 2.4 MIL/uL — ABNORMAL LOW (ref 4.22–5.81)
RDW: 16.3 % — ABNORMAL HIGH (ref 11.5–15.5)
WBC: 13.2 10*3/uL — ABNORMAL HIGH (ref 4.0–10.5)
nRBC: 0.4 % — ABNORMAL HIGH (ref 0.0–0.2)

## 2018-10-29 LAB — HEPATIC FUNCTION PANEL
ALT: 46 U/L — ABNORMAL HIGH (ref 0–44)
AST: 61 U/L — ABNORMAL HIGH (ref 15–41)
Albumin: 1.5 g/dL — ABNORMAL LOW (ref 3.5–5.0)
Alkaline Phosphatase: 86 U/L (ref 38–126)
Bilirubin, Direct: <0.1 mg/dL (ref 0.0–0.2)
Total Bilirubin: 0.4 mg/dL (ref 0.3–1.2)
Total Protein: 5.6 g/dL — ABNORMAL LOW (ref 6.5–8.1)

## 2018-10-29 LAB — FOLATE: Folate: 6.2 ng/mL (ref >5.9)

## 2018-10-29 LAB — PREPARE RBC (CROSSMATCH)

## 2018-10-29 LAB — SAVE SMEAR(SSMR), FOR PROVIDER SLIDE REVIEW

## 2018-10-29 LAB — LACTATE DEHYDROGENASE: LDH: 203 U/L — ABNORMAL HIGH (ref 98–192)

## 2018-10-29 LAB — IRON AND TIBC
Iron: 25 ug/dL — ABNORMAL LOW (ref 45–182)
Saturation Ratios: 13 % — ABNORMAL LOW (ref 17.9–39.5)
TIBC: 197 ug/dL — ABNORMAL LOW (ref 250–450)
UIBC: 172 ug/dL

## 2018-10-29 LAB — GLUCOSE, CAPILLARY
Glucose-Capillary: 204 mg/dL — ABNORMAL HIGH (ref 70–99)
Glucose-Capillary: 224 mg/dL — ABNORMAL HIGH (ref 70–99)
Glucose-Capillary: 186 mg/dL — ABNORMAL HIGH (ref 70–99)

## 2018-10-29 LAB — PROTIME-INR
INR: 1.9 — ABNORMAL HIGH (ref 0.8–1.2)
Prothrombin Time: 21.9 seconds — ABNORMAL HIGH (ref 11.4–15.2)

## 2018-10-29 LAB — VITAMIN B12: Vitamin B-12: 586 pg/mL (ref 180–914)

## 2018-10-29 LAB — FERRITIN: Ferritin: 155 ng/mL (ref 24–336)

## 2018-10-29 MED ORDER — FERROUS SULFATE 325 (65 FE) MG PO TABS
325.0000 mg | ORAL_TABLET | Freq: Three times a day (TID) | ORAL | Status: DC
  Administered 2018-10-29 – 2018-10-31 (×7): 325 mg via ORAL
  Filled 2018-10-29 (×7): qty 1

## 2018-10-29 MED ORDER — SODIUM CHLORIDE 0.9% IV SOLUTION
Freq: Once | INTRAVENOUS | Status: AC
  Administered 2018-10-29: 12:00:00 via INTRAVENOUS

## 2018-10-29 MED ORDER — DOXYCYCLINE HYCLATE 100 MG PO TABS
100.0000 mg | ORAL_TABLET | Freq: Two times a day (BID) | ORAL | Status: DC
  Administered 2018-10-29 – 2018-10-31 (×6): 100 mg via ORAL
  Filled 2018-10-29 (×5): qty 1

## 2018-10-29 MED ORDER — WARFARIN SODIUM 4 MG PO TABS
4.0000 mg | ORAL_TABLET | Freq: Once | ORAL | Status: AC
  Administered 2018-10-29: 4 mg via ORAL
  Filled 2018-10-29: qty 1

## 2018-10-29 MED ORDER — INSULIN GLARGINE 100 UNIT/ML ~~LOC~~ SOLN
45.0000 [IU] | Freq: Every day | SUBCUTANEOUS | Status: DC
  Administered 2018-10-29: 45 [IU] via SUBCUTANEOUS
  Filled 2018-10-29 (×2): qty 0.45

## 2018-10-29 MED ORDER — WARFARIN SODIUM 5 MG PO TABS: 5.0000 mg | ORAL_TABLET | Freq: Once | ORAL | Status: DC

## 2018-10-29 MED ORDER — SODIUM ZIRCONIUM CYCLOSILICATE 5 G PO PACK
5.0000 g | PACK | Freq: Once | ORAL | Status: AC
  Administered 2018-10-29: 5 g via ORAL
  Filled 2018-10-29: qty 1

## 2018-10-29 NOTE — Progress Notes (Addendum)
ANTICOAGULATION CONSULT NOTE -follow up  Pharmacy Consult for warfarin Indication: aflutter and history of DVT  No Known Allergies  Patient Measurements: Height: 5\' 9"  (175.3 cm) Weight: 283 lb 11.7 oz (128.7 kg) IBW/kg (Calculated) : 70.7 Heparin Dosing Weight: 103.9 kg  Vital Signs: Temp: 97.7 F (36.5 C) (04/06 0806) Temp Source: Oral (04/06 0806) BP: 132/32 (04/06 0806) Pulse Rate: 73 (04/06 0806)  Labs: Recent Labs    10/27/18 0102 10/27/18 1113 10/28/18 0858 10/29/18 0656 10/29/18 1023  HGB 5.8* 7.0* 6.0* 6.4*  --   HCT 19.1* 21.7* 18.9* 20.3*  --   PLT 442* 537* 505* 510*  --   LABPROT 19.3*  --  18.4*  --  21.9*  INR 1.7*  --  1.6*  --  1.9*  CREATININE 2.42* 2.54* 2.20* 1.90*  --     Estimated Creatinine Clearance: 57 mL/min (A) (by C-G formula based on SCr of 1.9 mg/dL (H)).   Medical History: Past Medical History:  Diagnosis Date  . Arm vein blood clot, left    on Coumadin  . Arthritis   . Cancer ([place])    Bone cancer 1987 (in knee Large cell tumor)  . Chronic kidney disease    stage 3  . Depression   . Essential hypertension   . History of stroke   . Hyperlipidemia   . Hypothyroidism   . Insomnia   . Obesity   . Precordial pain June 2011   Nuclear stress; no ischemia; EF 60%  . Presence of permanent cardiac pacemaker   . Proteinuria   . Seizure disorder ([place])   . Sick sinus syndrome ([place])   . Sleep apnea    Dr. Brandon Melnick, uses bipap  . Syncope   . Type 2 diabetes mellitus ([place])   . Venous insufficiency     Assessment: 18 yoM on warfarin PTA for atrial flutter/history of DVT. Patient is s/p right AKA on 4/3. Pharmacy has been consulted to resume patient's warfarin.  Patient's INR remains subtherapeutic but it trending up toward goal at 1.9 this AM. Patient with low Hgb of 6.4 this AM (from 6.0) after transfusion of 2 units PRBC yesterday. HCT up to 20.3 remains low, and pltc stable at 510. No bleeding noted.    PTA dose: warfarin 10 mg  PO every MWF, and 7.5 mg PO on all other days of the week  Goal of Therapy:  INR 2-3 Monitor platelets by anticoagulation protocol: Yes   Plan:  Warfarin 4 mg PO x 1 tonight Monitor daily INR Monitor for s/sx of bleeding  Thank you for allowing pharmacy to be a part of this patient's care.  Nicole Cella, [place] Clinical Pharmacist Pager: [phone] Please check AMION for all [place] phone numbers After 10:00 PM, call [place] [phone] 10/29/2018,11:11 AM   ADDENDUM:  Antibiotic therapy changed to oral doxycycline today, Ortho plans 4 weeks of antibiotic therapy.  Note Doxycycline may increase hypothrombinemic effect of warfarin.   Plan:  Warfarin dose was already decreased appropriately today.  Will continue to monitor.  Nicole Cella, [place] Clinical Pharmacist Pager: [phone] 10/29/2018 12:29 PM

## 2018-10-29 NOTE — TOC Progression Note (Signed)
Transition of Care [facility]) - Progression Note    Patient Details  Name: Ethan Campbell MRN: [id number] Date of Birth: [birth year]  [facility] of [facility]) CM/SW Contact  Eileen Stanford, LCSW Phone Number: 10/29/2018, 3:06 PM  Clinical Narrative:   CSW continuing to follow. Pt would prefer to go home at d/c however agreeable to [place] if needs SNF level of care. Pt's COVID-19 test results pending at SNF.    Expected Discharge Plan: Skilled Nursing Facility Barriers to Discharge: (pending COVID-19 test)  Expected Discharge Plan and Services Expected Discharge Plan: [place] In-house Referral: (None at this time)   Post Acute Care Choice: [place] Living arrangements for the past 2 months: [place]                           Social Determinants of Health (SDOH) Interventions    Readmission Risk Interventions Readmission Risk Prevention Plan 10/25/2018  Transportation Screening Complete  PCP or Specialist Appt within 3-5 Days Complete  HRI or Home Care Consult Complete  HRI or Home Care Consult comments N/A  Social Work Consult for [place] Planning/Counseling Complete  Palliative Care Screening Not Applicable  Medication Review [employer]) Complete  Some recent data might be hidden

## 2018-10-29 NOTE — Progress Notes (Signed)
PROGRESS NOTE    Ethan Campbell  RSW:[id number] DOB: 1961/01/17 DOA: 10/22/2018 PCP: Arsenio Katz, NP   Brief Narrative: Patient is a 58 year old male with history of hypertension, hyperlipidemia, diabetes type 2 on insulin pump, hypothyroidism, depression, left arm DVT on Coumadin, bone cancer, OSA on CPAP, CKD stage III who presented with complaints of right leg wound infection. Patient just had BKA of right lower extremity by orthopedics on 10/04/2018.  Wound VAC was taken down 6 days PTA.  He was also having fever of 103 F and chills at skilled nursing facility.  Patient stated that he had contact with Covid-19 positive health worker at the facility.  Test for COVID-19 sent from SNF on 3/30 and results still pending as of today.  Patient is completely asymptomatic.  Orthopedics consulted, patient underwent right AKA 4/3, postop course complicated by hypotension (resolved), suspected ATN and anemia requiring transfusion.  Assessment & Plan:   Principal Problem:   Wound infection after surgery Active Problems:   Essential hypertension, benign   Acute renal failure superimposed on stage 3 chronic kidney disease (HCC)   HLD (hyperlipidemia)   Below-knee amputation of right lower extremity (HCC)   Uncontrolled type 2 diabetes mellitus with polyneuropathy (HCC)   Normocytic anemia   Sepsis (HCC)   DVT (deep venous thrombosis) ([place])   Hypothyroidism   Close Exposure to Covid-19 Virus   Dehiscence of amputation stump (HCC)   Severe protein-calorie malnutrition ([place])   Suspected sepsis secondary to postop wound infection of the right lower extremity:  History of recent BKA.   Failed outpatient doxycycline treatment. Started on empiric antibiotics, IV ceftriaxone and vancomycin.  Blood cultures x2: Negative, final report. Sepsis resolved. S/P right AKA on 4/3 by Dr. Sharol Given.  Has wound VAC in place. IV antibiotics discontinued 4/6 and changed to oral doxycycline for total of 4 weeks  beginning 4/6.  Weightbearing as tolerated on left lower extremity.  No significant bleeding from wound VAC site.  Right lower extremity amputation site infection:  S/P right AKA on 4/3 by Dr. Sharol Given.  Has wound VAC in place. Management as indicated above.  Suspicion for Covid-19:  Patient stated that he had contact with nursing staff at [facility] in [place] with COVID-19.  Test was sent off on 3/30 and results are still pending.  Patient has been asymptomatic for several days.  Still waiting for results sent from SNF on 3/30.  Discussed with patient's RN and charge RN on floor.  Essential hypertension: Lotensin and Lasix on hold due to acute kidney injury.  Transient postop hypotension has resolved after IV crystalloid bolus and blood transfusion.  Metoprolol 12.5 mg twice daily started due to atrial flutter with RVR.  Also on PRN IV hydralazine.  Controlled.  AKI on CKD stage III:  Baseline creatinine ranges from 2-2.3.  Presented with mild acute kidney injury.  Acute kidney injury had resolved.  IV fluids were discontinued.  Creatinine went up from 1.8 preop to 2.4 postop, suspect due to hypotension, possible ATN related to ABLA.  After IV fluids and blood transfusion, creatinine has improved to 1.9.  Continue to follow BMP closely.  Hyperkalemia, mild This had resolved but again potassium back up to 5.3 today.?  RTA for inpatient with longstanding DM.  Gave a dose of Lokelma 5 g, follow-up BMP in a.m.  Type 2 diabetes mellitus/hypoglycemia:  Hemoglobin A1c of 6.9 on 11/19.  Patient had a mild hypoglycemic episode with CBG of 54 on 4/1 while he was  still on U5 100 insulin pump, Lantus 5 units daily and NovoLog SSI. Discussed in detail with diabetes coordinator 4/1 and as per their recommendations, DC'ed insulin pump/U5 100, started Lantus 40 units daily, SSI and mealtime NovoLog.  FBG 204.  Increase Lantus to 45 units at bedtime.  Hyperlipidemia:  Continue home meds  Postop ABLA  complicating chronic normocytic anemia/suspect anemia of chronic disease and chronic kidney disease.:  Hemoglobin recently has been in the 8-9 range.  Had hemoglobin of 6.5 on 3/16 and may have received blood transfusion at that time.  He presented with hemoglobin of 8.9 which gradually drifted down to 8 preop and then postop dropped to 5.8 despite no significant operative bleeding per Dr. Sharol Given.  No other overt source of bleeding noted.  S/P 1 unit PRBC 4/4, hemoglobin up to 7, then dropped to 6, second unit PRBC 4/5 (only received 1 of 2 units ordered), hemoglobin up to 6.4, ordered 30 unit PRBC transfusion 4/6.  Etiology may be multifactorial: Anemia of chronic disease, iron deficiency, postop ABLA.  Anemia panel reviewed: Iron 25, TIBC 197, saturation ratio 13, ferritin 155, folate 6.2, B12: 586, reticulocyte count 62.8.  LDH 203, haptoglobin pending.  Low index of suspicion for hemolysis. Check LFT's. Check FOBT If continues to have anemia that is not responsive to PRBCs, discuss with hematology.  History of DVT:  As per patient, he had a solitary episode of left upper extremity DVT approximately 1 year ago.  He is unsure as to why he is on long-term anticoagulation.  He denies history of recurrent DVT, PE or being told of any hypercoagulable status. As per review of cardiology follow-up in care everywhere, history of run of atrial tachycardia versus atrial flutter noted in March 2019.  As per cardiology follow-up 02/21/2018, he was advised to continue warfarin for stroke prophylaxis. As discussed with Dr. Sharol Given, resumed Coumadin but target INR between 2-3.  He had presented with INR of greater than 3.  Follow CBC closely.  INR subtherapeutic/1.9, improving.  Coumadin per pharmacy.  Atrial flutter with RVR On anticoagulation for this indication.  Follows with cardiology at [facility] atrial flutter with intermittent RVR on 4/4.  Reviewed case with cardiology.  Echo in 2015 with  normal EF.  Recent pacemaker check was normal. Started metoprolol 12.5 mg twice daily.  Controlled.  Hypothyroidism:  Continue Synthyroid  OSA: Uses CPAP at night, currently not getting due to Covid-19 rule out.  Morbid obesity/Body mass index is 41.9 kg/m.   Chronic diastolic CHF Remains clinically compensated.  Currently off of diuretics.  History of cardiogenic syncope/sick sinus syndrome As per nephrology note in care everywhere.  He is status post pacemaker 02/14/2017 at [facility].  Seizure disorder Patient reports that he is not on medications for this.  Claims that his last episode was prior to last surgery in early March 2020.  Does not drive.  History of orthostatic hypotension Remains on midodrine.  Followed at [facility].      PPE: Patient: None.  Provider: Head cover,CAPR, gown, gloves and shoe cover.  Currently on droplet and contact precautions. DVT prophylaxis: Resumed Coumadin per pharmacy.  Added subcutaneous heparin DVT prophylactic dose until INR therapeutic. Code Status: Full code Family Communication: None present at the bedside Disposition Plan: To be determined pending surgery and clinical improvement.  He presented from SNF.?  Return to SNF.   Consultants:  Orthopedics  Procedures:  Right AKA with wound VAC placement on 4/3.  Antimicrobials:  Anti-infectives (From admission, onward)   Start     Dose/Rate Route Frequency Ordered Stop   10/29/18 1330  doxycycline (VIBRA-TABS) tablet 100 mg     100 mg Oral Every 12 hours 10/29/18 1227     10/27/18 2100  vancomycin (VANCOCIN) 1,000 mg in sodium chloride 0.9 % 250 mL IVPB  Status:  Discontinued     1,000 mg 250 mL/hr over 60 Minutes Intravenous Every 24 hours 10/27/18 0923 10/29/18 1227   10/26/18 0700  ceFAZolin (ANCEF) IVPB 2g/100 mL premix     2 g 200 mL/hr over 30 Minutes Intravenous To [facility] 10/25/18 1959 10/26/18 1708   10/24/18 2100  vancomycin (VANCOCIN) 1,250 mg  in sodium chloride 0.9 % 250 mL IVPB  Status:  Discontinued     1,250 mg 166.7 mL/hr over 90 Minutes Intravenous Every 24 hours 10/24/18 0936 10/27/18 0923   10/24/18 0800  vancomycin (VANCOCIN) 1,500 mg in sodium chloride 0.9 % 500 mL IVPB  Status:  Discontinued     1,500 mg 250 mL/hr over 120 Minutes Intravenous Every 36 hours 10/22/18 1917 10/23/18 0919   10/23/18 2100  vancomycin (VANCOCIN) 1,000 mg in sodium chloride 0.9 % 250 mL IVPB  Status:  Discontinued     1,000 mg 250 mL/hr over 60 Minutes Intravenous Every 24 hours 10/23/18 0919 10/24/18 0936   10/23/18 0000  cefTRIAXone (ROCEPHIN) 2 g in sodium chloride 0.9 % 100 mL IVPB  Status:  Discontinued     2 g 200 mL/hr over 30 Minutes Intravenous Every 24 hours 10/22/18 2345 10/29/18 1227   10/22/18 1930  ceFEPIme (MAXIPIME) 2 g in sodium chloride 0.9 % 100 mL IVPB  Status:  Discontinued     2 g 200 mL/hr over 30 Minutes Intravenous Every 24 hours 10/22/18 1917 10/22/18 2345   10/22/18 1930  vancomycin (VANCOCIN) 2,500 mg in sodium chloride 0.9 % 500 mL IVPB     2,500 mg 250 mL/hr over 120 Minutes Intravenous  Once 10/22/18 1917 10/23/18 1905      Subjective: Patient denies complaints.  No pain reported.  Denies black stools or bleeding elsewhere.  Denies bruising.  Objective: Vitals:   10/29/18 0341 10/29/18 0806 10/29/18 1145 10/29/18 1200  BP: (!) 130/47 (!) 132/32 (!) 115/56 (!) 105/59  Pulse: 68 73 70   Resp:  18 18 18   Temp: 98.7 F (37.1 C) 97.7 F (36.5 C) 97.8 F (36.6 C) 97.7 F (36.5 C)  TempSrc: Oral Oral Oral Oral  SpO2: 100% 98%    Weight:      Height:        Intake/Output Summary (Last 24 hours) at 10/29/2018 1325 Last data filed at 10/29/2018 1145 Gross per 24 hour  Intake 732.33 ml  Output 500 ml  Net 232.33 ml   Filed Weights   10/22/18 1627 10/22/18 2350  Weight: (!) 140.2 kg 128.7 kg    Examination:  General exam: Pleasant young male, moderately built and morbidly obese lying comfortably  propped up in bed.  Oral mucosa moist.  Did not appear in any distress. Respiratory system: Clear to auscultation.  No increased work of breathing.  Stable Cardiovascular system: S1 and S2 heard, irregularly irregular.  No JVD, murmurs or pedal edema.  Telemetry personally reviewed: Sinus rhythm/on demand atrial paced rhythm. Gastrointestinal system: Abdomen is nondistended, soft and nontender. No organomegaly or masses felt. Normal bowel sounds heard.  Stable Central nervous system: Alert and oriented. No focal neurological deficits.  Stable.  Extremities: Right AKA with wound VAC in place without acute findings.  Symmetric 5 x 5 power. Skin: No rashes, lesions or ulcers,no icterus ,no pallor   Data Reviewed: I have personally reviewed following labs and imaging studies  CBC: Recent Labs  Lab 10/22/18 1652  10/24/18 0523  10/26/18 0800 10/27/18 0102 10/27/18 1113 10/28/18 0858 10/29/18 0656  WBC 18.9*   < > 11.2*   < > 14.3* 11.5* 14.4* 12.8* 13.2*  NEUTROABS 14.9*  --  8.7*  --   --   --   --   --   --   HGB 8.9*   < > 9.1*   < > 8.0* 5.8* 7.0* 6.0* 6.4*  HCT 30.2*   < > 30.1*   < > 25.4* 19.1* 21.7* 18.9* 20.3*  MCV 87.3   < > 88.8   < > 84.7 86.8 85.1 85.5 84.6  PLT 680*   < > 472*   < > 541* 442* 537* 505* 510*   < > = values in this interval not displayed.   Basic Metabolic Panel: Recent Labs  Lab 10/26/18 0800 10/27/18 0102 10/27/18 1113 10/28/18 0858 10/29/18 0656  NA 134* 132* 130* 130* 129*  K 4.6 5.2* 5.0 4.6 5.3*  CL 102 102 98 98 99  CO2 25 21* 23 24 25   GLUCOSE 214* 241* 271* 217* 213*  BUN 51* 62* 69* 75* 72*  CREATININE 1.81* 2.42* 2.54* 2.20* 1.90*  CALCIUM 8.3* 7.8* 7.9* 7.9* 8.0*   GFR: Estimated Creatinine Clearance: 57 mL/min (A) (by C-G formula based on SCr of 1.9 mg/dL (H)). Liver Function Tests: Recent Labs  Lab 10/22/18 1652  AST 41  ALT 29  ALKPHOS 108  BILITOT 0.7  PROT 7.8  ALBUMIN 2.4*   Coagulation Profile: Recent Labs  Lab  10/25/18 0721 10/26/18 0800 10/27/18 0102 10/28/18 0858 10/29/18 1023  INR 2.2* 1.8* 1.7* 1.6* 1.9*   CBG: Recent Labs  Lab 10/28/18 1157 10/28/18 1615 10/28/18 2201 10/29/18 0802 10/29/18 1139  GLUCAP 216* 159* 194* 204* 224*     Recent Results (from the past 240 hour(s))  Blood culture (routine x 2)     Status: None   Collection Time: 10/22/18  5:00 PM  Result Value Ref Range Status   Specimen Description BLOOD RIGHT ANTECUBITAL  Final   Special Requests   Final    BOTTLES DRAWN AEROBIC AND ANAEROBIC Blood Culture results may not be optimal due to an excessive volume of blood received in culture bottles   Culture   Final    NO GROWTH 5 DAYS Performed at [facility], [place] [address] [place], [place] [address]    Report Status 10/27/2018 FINAL  Final  Blood culture (routine x 2)     Status: None   Collection Time: 10/22/18  5:10 PM  Result Value Ref Range Status   Specimen Description BLOOD LEFT ANTECUBITAL  Final   Special Requests   Final    BOTTLES DRAWN AEROBIC AND ANAEROBIC Blood Culture results may not be optimal due to an excessive volume of blood received in culture bottles   Culture   Final    NO GROWTH 5 DAYS Performed at [facility], [place] [address] [place], [place] [address]    Report Status 10/27/2018 FINAL  Final         Radiology Studies: No results found.      Scheduled Meds: . aspirin EC  81 mg Oral Daily  .  atorvastatin  80 mg Oral Daily  . docusate sodium  100 mg Oral BID  . doxycycline  100 mg Oral Q12H  . DULoxetine  60 mg Oral Daily  . feeding supplement (PRO-STAT SUGAR FREE 64)  30 mL Oral TID  . fenofibrate  160 mg Oral Daily  . FLUoxetine  20 mg Oral Daily  . gabapentin  300 mg Oral BID  . insulin aspart  0-15 Units Subcutaneous TID WC  . insulin aspart  0-5 Units Subcutaneous QHS  . insulin aspart  8 Units Subcutaneous TID WC  . insulin glargine  40 Units Subcutaneous QHS  . levothyroxine  100 mcg Oral Daily   . metoprolol tartrate  12.5 mg Oral BID  . midodrine  5 mg Oral TID WC  . multivitamin with minerals  1 tablet Oral Daily  . tamsulosin  0.4 mg Oral Daily  . tiZANidine  4 mg Oral BID  . vitamin C  500 mg Oral BID  . warfarin  4 mg Oral ONCE-1800  . Warfarin - Pharmacist Dosing Inpatient   Does not apply q1800  . zinc sulfate  220 mg Oral Daily   Continuous Infusions: . methocarbamol (ROBAXIN) IV       LOS: 7 days     Vernell Leep, MD, FACP, [facility]. [facility]  To contact the attending provider between 7A-7P or the covering provider during after hours 7P-7A, please log into the web site [email or web address] and access using universal [place] password for that web site. If you do not have the password, please call the hospital operator.

## 2018-10-29 NOTE — Evaluation (Signed)
Physical Therapy Evaluation Patient Details Name: Ethan Campbell MRN: [id number] DOB: 04/26/1961 Today's Date: 10/29/2018   History of Present Illness  58 y.o. male admitted on 10/22/18 with fever and chills, recent R BKA that was gangrenous and had to be converted to an AKA on 10/26/18.  Pt was being tested for COVID-19 at the SNF where he was getting rehab (swabbed 10/22/18). As of initial PT evaluation on 10/29/18 results are pending.  Pt with significant PMH of R BKA on 10/05/18, DM2, seizure, sick sinus syndrome, obesity, essential HTN, CKD stage 3, L TKA.     Clinical Impression  Pt was able to laterally squat/scoot OOB to the drop arm recliner chair with min assist for safety and stability during transfer.  He did not feel comfortable trying to stand with RW at this time.  He is appropriate for post acute rehab at discharge.  PT will continue to follow acutely for safe mobility progression    Follow Up Recommendations SNF    Equipment Recommendations  None recommended by PT    Recommendations for Other Services   NA    Precautions / Restrictions Precautions Precautions: Fall Precaution Comments: wound vac Restrictions Weight Bearing Restrictions: Yes RLE Weight Bearing: Non weight bearing      Mobility  Bed Mobility Overal bed mobility: Needs Assistance Bed Mobility: Supine to Sit     Supine to sit: Min assist     General bed mobility comments: Min hand held assist to pull to sitting EOB with left hand, right hand using bed rail and HOB elevated ~ 30 degrees.   Transfers Overall transfer level: Needs assistance Equipment used: None Transfers: Lateral/Scoot Transfers          Lateral/Scoot Transfers: Min assist General transfer comment: Min assist to ensure safety and to make sure his foot would not slip out from under him.  I asked him if he would like to try with RW, but he stated he didn't trust the RW and would prefer his crutches that he was used to prior to the  BKA surgery.  "I was just about to get to practice with them at rehab when all of this happened."  Ambulation/Gait             General Gait Details: unable to safely at this time.       Balance Overall balance assessment: Needs assistance Sitting-balance support: Feet supported;Bilateral upper extremity supported Sitting balance-Leahy Scale: Fair                                       Pertinent Vitals/Pain Pain Assessment: Faces Faces Pain Scale: Hurts even more Pain Location: R residual limb Pain Descriptors / Indicators: Grimacing;Guarding Pain Intervention(s): Limited activity within patient's tolerance;Monitored during session;Repositioned    Home Living Family/patient expects to be discharged to:: Skilled nursing facility Living Arrangements: Spouse/significant other Available Help at Discharge: Family;Available 24 hours/day Type of Home: Mobile home Home Access: Ramped entrance     Home Layout: One level Home Equipment: [place] - 4 wheels;Bedside commode;Adaptive equipment;Crutches      Prior Function Level of Independence: Needs assistance   Gait / Transfers Assistance Needed: did not have much of a chance to start rehab at SNF before he transferred back to [facility] for repeat surgery.           Hand Dominance   Dominant Hand: Right    Extremity/Trunk  Assessment   Upper Extremity Assessment Upper Extremity Assessment: Overall WFL for tasks assessed    Lower Extremity Assessment Lower Extremity Assessment: RLE deficits/detail RLE Deficits / Details: right post op AKA (high), edematous residual limb with 3-/5 strenght and diffuse edema.    Cervical / Trunk Assessment Cervical / Trunk Assessment: Normal  Communication   Communication: No difficulties  Cognition Arousal/Alertness: Awake/alert Behavior During Therapy: WFL for tasks assessed/performed Overall Cognitive Status: Within Functional Limits for tasks assessed                                            Exercises Other Exercises Other Exercises: We discussed keeping his left leg moving while up in the chair with seated kicks, marches and ankle pumps.  He demonstrated the ability to move his right residual limb up into flexion and out into abduction seated in the chair.  I encouraged him to get in sidelying later and do some hip extension exercises.  He was familiar with these and that he needed to keep his hip flexible to be fitted with a successful prosthesis.    Assessment/Plan    PT Assessment Patient needs continued PT services  PT Problem List Decreased strength;Decreased activity tolerance;Decreased range of motion;Decreased balance;Decreased mobility;Decreased knowledge of use of DME;Decreased knowledge of precautions;Obesity;Pain       PT Treatment Interventions DME instruction;Gait training;Functional mobility training;Therapeutic activities;Therapeutic exercise;Balance training;Patient/family education    PT Goals (Current goals can be found in the Care Plan section)  Acute Rehab PT Goals Patient Stated Goal: go home soon PT Goal Formulation: With patient Time For Goal Achievement: 11/12/18 Potential to Achieve Goals: Good    Frequency Min 3X/week           AM-PAC PT "6 Clicks" Mobility  Outcome Measure Help needed turning from your back to your side while in a flat bed without using bedrails?: A Little Help needed moving from lying on your back to sitting on the side of a flat bed without using bedrails?: A Little Help needed moving to and from a bed to a chair (including a wheelchair)?: A Little Help needed standing up from a chair using your arms (e.g., wheelchair or bedside chair)?: A Little Help needed to walk in hospital room?: Total Help needed climbing 3-5 steps with a railing? : Total 6 Click Score: 14    End of Session   Activity Tolerance: Patient limited by pain Patient left: in chair;with call bell/phone  within reach Nurse Communication: Mobility status;Other (comment)(to Engineer, manufacturing) PT Visit Diagnosis: Muscle weakness (generalized) (M62.81);Difficulty in walking, not elsewhere classified (R26.2);Pain Pain - Right/Left: Right Pain - part of body: Leg    Time: 7846-9629 PT Time Calculation (min) (ACUTE ONLY): 28 min   Charges:         Wells Guiles B. Malone Admire, PT, DPT  Acute Rehabilitation [phone] pager #[phone] office   PT Evaluation $PT Eval Moderate Complexity: 1 Mod PT Treatments $Therapeutic Activity: 8-22 mins       10/29/2018, 6:40 PM

## 2018-10-29 NOTE — Progress Notes (Signed)
Physical medicine rehabilitation consult requested chart reviewed. Per report patient is presently from [facility] facility and documented plan to return back to skilled facility. Await further therapy to be completed and currently is return back to skilled nursing facility hold on current formal rehabilitation consult at this time

## 2018-10-29 NOTE — Progress Notes (Signed)
Inpatient Diabetes Program Recommendations  AACE/ADA: New Consensus Statement on Inpatient Glycemic Control (2015)  Target Ranges:  Prepandial:   less than 140 mg/dL      Peak postprandial:   less than 180 mg/dL (1-2 hours)      Critically ill patients:  140 - 180 mg/dL   Lab Results  Component Value Date   GLUCAP 224 (H) 10/29/2018   HGBA1C 6.9 (A) 06/15/2018    Review of Glycemic Control Results for Ethan Campbell, Ethan Campbell (MRN [id number]) as of 10/29/2018 12:48  Ref. Range 10/28/2018 11:57 10/28/2018 16:15 10/28/2018 22:01 10/29/2018 08:02  Glucose-Capillary Latest Ref Range: 70 - 99 mg/dL 216 (H) 159 (H) 194 (H) 204 (H)   Current orders for Inpatient glycemic control:  Lantus 40 units q HS, Novolog 10 units tid with meals, Novolog moderate tid with meals and HS  Inpatient Diabetes Program Recommendations:    Please consider increasing Lantus to 45 units q HS.   Thanks,  Adah Perl, RN, BC-ADM Inpatient Diabetes Coordinator Pager [phone] (8a-5p)

## 2018-10-30 ENCOUNTER — Encounter (HOSPITAL_COMMUNITY): Payer: Self-pay | Admitting: Orthopedic Surgery

## 2018-10-30 LAB — CBC
HCT: 24.2 % — ABNORMAL LOW (ref 39.0–52.0)
Hemoglobin: 7.6 g/dL — ABNORMAL LOW (ref 13.0–17.0)
MCH: 27 pg (ref 26.0–34.0)
MCHC: 31.4 g/dL (ref 30.0–36.0)
MCV: 85.8 fL (ref 80.0–100.0)
Platelets: 504 10*3/uL — ABNORMAL HIGH (ref 150–400)
RBC: 2.82 MIL/uL — ABNORMAL LOW (ref 4.22–5.81)
RDW: 16.3 % — ABNORMAL HIGH (ref 11.5–15.5)
WBC: 12.7 10*3/uL — ABNORMAL HIGH (ref 4.0–10.5)
nRBC: 0.4 % — ABNORMAL HIGH (ref 0.0–0.2)

## 2018-10-30 LAB — BPAM RBC
Blood Product Expiration Date: 202004102359
Blood Product Expiration Date: 202004222359
Blood Product Expiration Date: 202004232359
ISSUE DATE / TIME: 202004040313
ISSUE DATE / TIME: 202004051228
ISSUE DATE / TIME: 202004061132
Unit Type and Rh: 5100
Unit Type and Rh: 5100
Unit Type and Rh: 9500

## 2018-10-30 LAB — BASIC METABOLIC PANEL
Anion gap: 6 (ref 5–15)
BUN: 56 mg/dL — ABNORMAL HIGH (ref 6–20)
CO2: 25 mmol/L (ref 22–32)
Calcium: 8.2 mg/dL — ABNORMAL LOW (ref 8.9–10.3)
Chloride: 102 mmol/L (ref 98–111)
Creatinine, Ser: 1.54 mg/dL — ABNORMAL HIGH (ref 0.61–1.24)
GFR calc Af Amer: 57 mL/min — ABNORMAL LOW (ref >60)
GFR calc non Af Amer: 49 mL/min — ABNORMAL LOW (ref >60)
Glucose, Bld: 230 mg/dL — ABNORMAL HIGH (ref 70–99)
Potassium: 5.2 mmol/L — ABNORMAL HIGH (ref 3.5–5.1)
Sodium: 133 mmol/L — ABNORMAL LOW (ref 135–145)

## 2018-10-30 LAB — GLUCOSE, CAPILLARY
Glucose-Capillary: 194 mg/dL — ABNORMAL HIGH (ref 70–99)
Glucose-Capillary: 229 mg/dL — ABNORMAL HIGH (ref 70–99)
Glucose-Capillary: 193 mg/dL — ABNORMAL HIGH (ref 70–99)
Glucose-Capillary: 84 mg/dL (ref 70–99)
Glucose-Capillary: 208 mg/dL — ABNORMAL HIGH (ref 70–99)

## 2018-10-30 LAB — TYPE AND SCREEN
ABO/RH(D): O POS
Antibody Screen: NEGATIVE
Unit division: 0
Unit division: 0
Unit division: 0

## 2018-10-30 LAB — PROTIME-INR
INR: 1.8 — ABNORMAL HIGH (ref 0.8–1.2)
Prothrombin Time: 20.9 seconds — ABNORMAL HIGH (ref 11.4–15.2)

## 2018-10-30 LAB — HAPTOGLOBIN: Haptoglobin: 205 mg/dL (ref 29–370)

## 2018-10-30 MED ORDER — SODIUM ZIRCONIUM CYCLOSILICATE 5 G PO PACK
5.0000 g | PACK | Freq: Once | ORAL | Status: AC
  Administered 2018-10-30: 5 g via ORAL
  Filled 2018-10-30: qty 1

## 2018-10-30 MED ORDER — WARFARIN SODIUM 5 MG PO TABS
5.0000 mg | ORAL_TABLET | Freq: Once | ORAL | Status: AC
  Administered 2018-10-30: 5 mg via ORAL
  Filled 2018-10-30: qty 1

## 2018-10-30 MED ORDER — HEPARIN SODIUM (PORCINE) 5000 UNIT/ML IJ SOLN
5000.0000 [IU] | Freq: Three times a day (TID) | INTRAMUSCULAR | Status: DC
  Administered 2018-10-30 – 2018-10-31 (×4): 5000 [IU] via SUBCUTANEOUS
  Filled 2018-10-30 (×4): qty 1

## 2018-10-30 MED ORDER — INSULIN GLARGINE 100 UNIT/ML ~~LOC~~ SOLN
50.0000 [IU] | Freq: Every day | SUBCUTANEOUS | Status: DC
  Administered 2018-10-30: 50 [IU] via SUBCUTANEOUS
  Filled 2018-10-30 (×3): qty 0.5

## 2018-10-30 NOTE — TOC Progression Note (Addendum)
Transition of Care [facility]) - Progression Note    Patient Details  Name: Ethan Campbell MRN: [id number] Date of Birth: 1960/11/14  [facility] CM/SW Contact  Zenon Mayo, RN Phone Number: 10/30/2018, 3:11 PM  Clinical Narrative:    NCM was informed to call wife of patient, Ethan Campbell , she states she does not want patient to go back to SNF, she is concerned that they have positive covid patients, so she wants to take patient home with HHRN,HHPT, hospital bed and w/chair.  NCM informed MD of this information.  Orders are in for DME, NCM contacted Brad with [employer] , he will contact wife to coordinate delivery of the hospital bed and w/chair. NCM offered choice for HH from Medicare . gov list.  NCM awaiting to here back from [facility] to see if they were able to take referral for RN, PT.  Patient will need ambulance transport home , the home address has been confirmed.      Expected Discharge Plan: Skilled Nursing Facility Barriers to Discharge: Continued Medical Work up  Expected Discharge Plan and Services Expected Discharge Plan: [place] In-house Referral: Clinical Social Work Discharge Planning Services: CM Consult Post Acute Care Choice: [place] arrangements for the past 2 months: [place]                 DME Arranged: Hospital bed, Wheelchair manual DME Agency: [facility] HH Arranged: Refused SNF     Social Determinants of Health (SDOH) Interventions    Readmission Risk Interventions Readmission Risk Prevention Plan 10/25/2018  Transportation Screening Complete  PCP or Specialist Appt within 3-5 Days Complete  HRI or Home Care Consult Complete  HRI or Home Care Consult comments N/A  Social Work Consult for [place] Planning/Counseling Complete  Palliative Care Screening Not Applicable  Medication Review Press photographer) Complete  Some recent data might be hidden

## 2018-10-30 NOTE — Progress Notes (Signed)
Addendum  Results of Covid-19 sent from SNF still not available and unclear as to when they will be back.  This is a major barrier in progressing patient including DC back to SNF.  He remains on droplet and contact isolation with PPE being used by providers.  I discussed with ID MD on call.  To expedite process, repeated Covid-19 on 4/7 and hopeful that this result will come back sooner than the one from SNF.  Vernell Leep, MD, FACP, [facility]. [facility]  To contact the attending provider between 7A-7P or the covering provider during after hours 7P-7A, please log into the web site [email or web address] and access using universal [place] password for that web site. If you do not have the password, please call the hospital operator.

## 2018-10-30 NOTE — OR Nursing (Signed)
Addendum created to event log for 10/26/2018. Patient did not come into the Recovery Area.

## 2018-10-30 NOTE — Progress Notes (Signed)
Inpatient Diabetes Program Recommendations  AACE/ADA: New Consensus Statement on Inpatient Glycemic Control (2015)  Target Ranges:  Prepandial:   less than 140 mg/dL      Peak postprandial:   less than 180 mg/dL (1-2 hours)      Critically ill patients:  140 - 180 mg/dL   Lab Results  Component Value Date   GLUCAP 229 (H) 10/30/2018   HGBA1C 6.9 (A) 06/15/2018    Review of Glycemic Control Results for Ethan Campbell, Ethan Campbell (MRN [id number]) as of 10/30/2018 11:26  Ref. Range 10/29/2018 08:02 10/29/2018 11:39 10/29/2018 16:15 10/29/2018 20:35 10/30/2018 07:40  Glucose-Capillary Latest Ref Range: 70 - 99 mg/dL 204 (H) 224 (H) 194 (H) 186 (H) 229 (H)   Diabetes history: DM 2 Outpatient Diabetes medications:  Omnipod insulin pump with humulin R U-500 insulin, Victoza 1.8 mg Daily (follows with Dr. Dwyane Dee, Endocrinologist)  Basal rates  12A0.4units/hour 12P0.9 units/hour 9P0.75units/hour  Total basal=15.15 U500 insulin which is equivalent of 75.75 units/24 hours of U100 Regular Current orders for Inpatient glycemic control:  Novolog moderate tid with meals and HS, Lantus 45 units q HS, Novolog 8 units tid with meals  Inpatient Diabetes Program Recommendations:    Please consider increasing Lantus further to 50 units q HS.   Thanks , Adah Perl, RN, BC-ADM Inpatient Diabetes Coordinator Pager [phone] (8a-5p)

## 2018-10-30 NOTE — Progress Notes (Signed)
ANTICOAGULATION CONSULT NOTE -follow up  Pharmacy Consult for warfarin Indication: aflutter and history of DVT  No Known Allergies  Patient Measurements: Height: 5\' 9"  (175.3 cm) Weight: 283 lb 11.7 oz (128.7 kg) IBW/kg (Calculated) : 70.7 Heparin Dosing Weight: 103.9 kg  Vital Signs: Temp: 97.6 F (36.4 C) (04/07 0700) Temp Source: Oral (04/07 0700) BP: 160/69 (04/07 0700) Pulse Rate: 70 (04/07 0700)  Labs: Recent Labs    10/28/18 0858 10/29/18 0656 10/29/18 1023 10/30/18 0420  HGB 6.0* 6.4*  --  7.6*  HCT 18.9* 20.3*  --  24.2*  PLT 505* 510*  --  504*  LABPROT 18.4*  --  21.9* 20.9*  INR 1.6*  --  1.9* 1.8*  CREATININE 2.20* 1.90*  --  1.54*    Estimated Creatinine Clearance: 70.3 mL/min (A) (by C-G formula based on SCr of 1.54 mg/dL (H)).  Assessment: 67 yoM on warfarin PTA for atrial flutter/history of DVT. Patient is s/p right AKA on 4/3.   Anticoag: h/o of LUE DVT and appears continued on warfarin for aflutter per cards at [facility].  Warfarin held on admit 2/2 need R AKA S/p AKA 4/3 , waf resumed 4/4. INR 1.8  PTA dose: warfarin 10 mg PO every MWF, and 7.5 mg PO on all other days of the week  Renal: AKI on CKD stage III: SCr 2.74 > 1.54 (baseline 2-2.3)  Heme/Onc: H&H 7.6/24.2, Plt 504  Goal of Therapy:  INR 2-3 Monitor platelets by anticoagulation protocol: Yes   Plan:  - Warfarin 5 mg PO x 1  - F/u daily INR  Levester Fresh, PharmD, BCPS, BCCCP Clinical Pharmacist [phone]  Please check AMION for all [place] numbers  10/30/2018 10:40 AM

## 2018-10-30 NOTE — Progress Notes (Signed)
Addendum  I just received a call from case management who discussed patient's care with his significant other whom I had discussed with a short while ago.  She indicated that she did not want patient to return to prior SNF due to cases of Covid-19 at that facility and preferred to take patient home with home health services.  In the interim, received message from 2W charge RN that SNF faxed them his Covid-19 results which are negative. Advised RN to cancel Covid-19 test ordered earlier today.  As per case management, arranged for [facility] RN, [place] [facility], hospital bed and wheelchair. Discontinued contact and droplet precautions, transfer out of 2W  Pending resolution of hyperkalemia, possible discharge home in a.m.  Vernell Leep, MD, FACP, [facility]. [facility]  To contact the attending provider between 7A-7P or the covering provider during after hours 7P-7A, please log into the web site [email or web address] and access using universal [place] password for that web site. If you do not have the password, please call the hospital operator.

## 2018-10-30 NOTE — Progress Notes (Signed)
PROGRESS NOTE    VERN GUERETTE  VOJ:[id number] DOB: [birth year] DOA: 10/22/2018 PCP: Arsenio Katz, NP   Brief Narrative: Patient is a 58 year old male with history of hypertension, hyperlipidemia, diabetes type 2 on insulin pump, hypothyroidism, depression, left arm DVT on Coumadin, bone cancer, OSA on CPAP, CKD stage III who presented from SNF with complaints of right leg wound infection. Patient just had BKA of right lower extremity by Orthopedics on 10/04/2018.  Wound VAC was taken down 6 days PTA.  He had fever of 103 F and chills at skilled nursing facility.  Patient stated that he had contact with Covid-19 positive health worker at the facility. Test for COVID-19 sent from SNF ([facility]) on 3/30, results are still pending as of 4/7 morning.  Orthopedics consulted, patient underwent right AKA 4/3. Postop course complicated by hypotension (resolved), AoCKD and mild hyperkalemia d/t suspected ATN (improving) and anemia (s/p 3 units PRBC's this admission, improved).  Orthopedics has changed antibiotics to PO doxycycline for 4 weeks from 10/29/2018.  Plan is for DC back to SNF pending negative Covid-19 results, resolution of hyperkalemia and further improvement in acute kidney injury.  Assessment & Plan:   Principal Problem:   Wound infection after surgery Active Problems:   Essential hypertension, benign   Acute renal failure superimposed on stage 3 chronic kidney disease (HCC)   HLD (hyperlipidemia)   Below-knee amputation of right lower extremity (HCC)   Uncontrolled type 2 diabetes mellitus with polyneuropathy (HCC)   Normocytic anemia   Sepsis (HCC)   DVT (deep venous thrombosis) ([place])   Hypothyroidism   Close Exposure to Covid-19 Virus   Dehiscence of amputation stump (HCC)   Severe protein-calorie malnutrition ([place])   Suspected sepsis secondary to postop wound infection of the right lower extremity:  History of recent right BKA.   Failed outpatient doxycycline  treatment. Started on empiric antibiotics, IV ceftriaxone and vancomycin.  Blood cultures x2: Negative, final report. Sepsis resolved. S/P right AKA on 4/3 by Dr. Sharol Given.  Has wound VAC in place which can be changed to [facility] at time of discharge to SNF. IV antibiotics discontinued 4/6 by Dr. Sharol Given and changed to oral doxycycline for total of 4 weeks beginning 4/6.  Weightbearing as tolerated on left lower extremity.  No significant bleeding from wound VAC site.  Outpatient follow-up with Dr. Sharol Given.  Right lower extremity amputation site infection:  S/P right AKA on 4/3 by Dr. Sharol Given.  Has wound VAC in place. Management as indicated above.  Suspicion for Covid-19:  Patient stated that he had contact with nursing staff at [facility] in [place] with COVID-19.  Test was sent off on 3/30 and results are still pending as per RN check with SNF on 4/7 morning.  Patient remains asymptomatic.    Essential hypertension: Lotensin and Lasix on hold due to acute kidney injury.  Transient postop hypotension resolved after IV crystalloid bolus and blood transfusion.  Metoprolol 12.5 mg twice daily started due to atrial flutter with RVR.  Also on PRN IV hydralazine.  Controlled.  AKI on CKD stage III:  Baseline creatinine ranges from 2-2.3.  Presented with mild acute kidney injury.  Acute kidney injury had resolved.  IV fluids were discontinued.  Creatinine went up from 1.8 preop to 2.4 postop, suspect due to hypotension, possible ATN related to ABLA.  After IV fluids and blood transfusion, creatinine has improved to 1.5.  Acute kidney injury resolved.  Periodically follow BMP.  Hyperkalemia, mild This had  resolved but again potassium back up to 5.3>5.2 today.?  RTA for inpatient with longstanding DM.  S/P Lokelma 5 g x 1 dose yesterday, will repeat again today.  Low potassium diet.  Consider starting prior home oral Lasix but at lower dose in a.m.  Type 2 diabetes mellitus/hypoglycemia:  Hemoglobin A1c of 6.9  on 11/19.  Patient had a mild hypoglycemic episode with CBG of 54 on 4/1 while he was still on U5 100 insulin pump, Lantus 5 units daily and NovoLog SSI. DM coordinator continue to follow and their input much appreciated, DC'ed insulin pump/U5 100, started Lantus, SSI and mealtime NovoLog.  Adjusting Lantus as needed, increased Lantus from 45units to 50 units at bedtime on 4/7.  Hyperlipidemia:  Continue home meds  Postop ABLA complicating chronic normocytic anemia/suspect anemia of chronic disease, chronic disease and iron deficiency. - Hemoglobin recently has been in the 8-9 range.  Had hemoglobin of 6.5 on 3/16 and may have received blood transfusion at that time.  He presented with hemoglobin of 8.9 which gradually drifted down to 8 preop and then postop dropped to 5.8 despite no significant operative bleeding per Dr. Sharol Given.  - No other overt source of bleeding noted. S/P 3 units PRBCs this admission and hemoglobin now up to 7.6.  Follow CBC in a.m. and closely thereafter. Etiology may be multifactorial: Anemia of chronic disease, CKD, iron deficiency, postop ABLA.  Anemia panel reviewed: Iron 25, TIBC 197, saturation ratio 13, ferritin 155, folate 6.2, B12: 586, reticulocyte count 62.8.  LDH 203, haptoglobin normal, bilirubin normal.  Low index of suspicion for hemolysis.  Check FOBT If hemoglobin repeatedly drops then consider discussing with hematology.  Consider erythropoietin.  History of DVT:  As per patient, he had a solitary episode of left upper extremity DVT approximately 1 year ago.  He is unsure as to why he is on long-term anticoagulation.  He denies history of recurrent DVT, PE or being told of any hypercoagulable status. As per review of cardiology follow-up in care everywhere, history of run of atrial tachycardia versus atrial flutter noted in March 2019.  As per cardiology follow-up 02/21/2018, he was advised to continue warfarin for stroke prophylaxis. As discussed with Dr. Sharol Given,  resumed Coumadin but target INR between 2-3.  He had presented with INR of greater than 3.  Follow CBC closely.  INR subtherapeutic/1.8.  Coumadin per pharmacy.  Atrial flutter with RVR On anticoagulation for this indication.  Follows with cardiology at [place] atrial flutter with intermittent RVR on 4/4.  Reviewed case with cardiology.  Echo in 2015 with normal EF.  Recent pacemaker check was normal. Started metoprolol 12.5 mg twice daily.  Controlled.  Hypothyroidism:  Continue Synthyroid  OSA: Uses CPAP at night, currently not getting due to Covid-19 rule out.  Morbid obesity/Body mass index is 41.9 kg/m.   Chronic diastolic CHF Remains clinically compensated.  Currently off of diuretics.  History of cardiogenic syncope/sick sinus syndrome As per review in care everywhere.  He is status post pacemaker [facility]/14/2017 at [facility].  Seizure disorder Patient reports that he is not on medications for this.  Claims that his last episode was prior to last surgery in early March 2020.  Does not drive.  History of orthostatic hypotension Remains on midodrine.  Followed at [facility].      PPE: Droplet and contact isolation.  Patient: None.  Provider: Head cover,CAPR, gown, gloves and shoe cover.   DVT prophylaxis: Resumed Coumadin  per pharmacy.  Added subcutaneous heparin DVT prophylactic dose until INR therapeutic. Code Status: Full code Family Communication: Discussed with patient's significant other, updated care and answered questions. She would like to speak to CSW -she has called multiple times yesterday without success.  She is a retired RT, is on disability herself and may not be able to care for him at home. Disposition Plan: To be determined pending clinical improvement & Covid-19 results.  He presented from SNF.?  Return to SNF.   Consultants:  Orthopedics  Procedures:  Right AKA with wound VAC placement on 4/3.  Antimicrobials:   Anti-infectives (From admission, onward)   Start     Dose/Rate Route Frequency Ordered Stop   10/29/18 1330  doxycycline (VIBRA-TABS) tablet 100 mg     100 mg Oral Every 12 hours 10/29/18 1227     10/27/18 2100  vancomycin (VANCOCIN) 1,000 mg in sodium chloride 0.9 % 250 mL IVPB  Status:  Discontinued     1,000 mg 250 mL/hr over 60 Minutes Intravenous Every 24 hours 10/27/18 0923 10/29/18 1227   10/26/18 0700  ceFAZolin (ANCEF) IVPB 2g/100 mL premix     2 g 200 mL/hr over 30 Minutes Intravenous To [facility] 10/25/18 1959 10/26/18 1708   10/24/18 2100  vancomycin (VANCOCIN) 1,250 mg in sodium chloride 0.9 % 250 mL IVPB  Status:  Discontinued     1,250 mg 166.7 mL/hr over 90 Minutes Intravenous Every 24 hours 10/24/18 0936 10/27/18 0923   10/24/18 0800  vancomycin (VANCOCIN) 1,500 mg in sodium chloride 0.9 % 500 mL IVPB  Status:  Discontinued     1,500 mg 250 mL/hr over 120 Minutes Intravenous Every 36 hours 10/22/18 1917 10/23/18 0919   10/23/18 2100  vancomycin (VANCOCIN) 1,000 mg in sodium chloride 0.9 % 250 mL IVPB  Status:  Discontinued     1,000 mg 250 mL/hr over 60 Minutes Intravenous Every 24 hours 10/23/18 0919 10/24/18 0936   10/23/18 0000  cefTRIAXone (ROCEPHIN) 2 g in sodium chloride 0.9 % 100 mL IVPB  Status:  Discontinued     2 g 200 mL/hr over 30 Minutes Intravenous Every 24 hours 10/22/18 2345 10/29/18 1227   10/22/18 1930  ceFEPIme (MAXIPIME) 2 g in sodium chloride 0.9 % 100 mL IVPB  Status:  Discontinued     2 g 200 mL/hr over 30 Minutes Intravenous Every 24 hours 10/22/18 1917 10/22/18 2345   10/22/18 1930  vancomycin (VANCOCIN) 2,500 mg in sodium chloride 0.9 % 500 mL IVPB     2,500 mg 250 mL/hr over 120 Minutes Intravenous  Once 10/22/18 1917 10/23/18 1905      Subjective: Intermittent right lower extremity postop pain, rated at 6/10 in severity, denies any other complaints.  Objective: Vitals:   10/29/18 2100 10/30/18 0148 10/30/18 0344 10/30/18 0700   BP: (!) 155/65 119/61 (!) 146/58 (!) 160/69  Pulse:  67 67 70  Resp:   16   Temp:  98 F (36.7 C) 97.7 F (36.5 C) 97.6 F (36.4 C)  TempSrc:  Oral Oral Oral  SpO2:  99% 100% 100%  Weight:      Height:        Intake/Output Summary (Last 24 hours) at 10/30/2018 1212 Last data filed at 10/29/2018 2221 Gross per 24 hour  Intake 675 ml  Output 2925 ml  Net -2250 ml   Filed Weights   10/22/18 1627 10/22/18 2350  Weight: (!) 140.2 kg 128.7 kg    Examination:  General  exam: Pleasant young male, moderately built and morbidly obese lying comfortably propped up in bed.  Oral mucosa moist.  Did not appear in any distress. Respiratory system: Clear to auscultation.  No increased work of breathing.  Stable Cardiovascular system: S1 and S2 heard, irregularly irregular.  No JVD, murmurs or pedal edema.  Telemetry personally reviewed: Sinus rhythm/a paced rhythm. Gastrointestinal system: Abdomen is nondistended, soft and nontender. No organomegaly or masses felt. Normal bowel sounds heard.  Stable Central nervous system: Alert and oriented. No focal neurological deficits.  Stable. Extremities: Right AKA with wound VAC in place without acute findings.  Symmetric 5 x 5 power.  Stable. Skin: No rashes, lesions or ulcers,no icterus ,no pallor   Data Reviewed: I have personally reviewed following labs and imaging studies  CBC: Recent Labs  Lab 10/24/18 0523  10/27/18 0102 10/27/18 1113 10/28/18 0858 10/29/18 0656 10/30/18 0420  WBC 11.2*   < > 11.5* 14.4* 12.8* 13.2* 12.7*  NEUTROABS 8.7*  --   --   --   --   --   --   HGB 9.1*   < > 5.8* 7.0* 6.0* 6.4* 7.6*  HCT 30.1*   < > 19.1* 21.7* 18.9* 20.3* 24.2*  MCV 88.8   < > 86.8 85.1 85.5 84.6 85.8  PLT 472*   < > 442* 537* 505* 510* 504*   < > = values in this interval not displayed.   Basic Metabolic Panel: Recent Labs  Lab 10/27/18 0102 10/27/18 1113 10/28/18 0858 10/29/18 0656 10/30/18 0420  NA 132* 130* 130* 129* 133*  K 5.2*  5.0 4.6 5.3* 5.2*  CL 102 98 98 99 102  CO2 21* 23 24 25 25   GLUCOSE 241* 271* 217* 213* 230*  BUN 62* 69* 75* 72* 56*  CREATININE 2.42* 2.54* 2.20* 1.90* 1.54*  CALCIUM 7.8* 7.9* 7.9* 8.0* 8.2*   GFR: Estimated Creatinine Clearance: 70.3 mL/min (A) (by C-G formula based on SCr of 1.54 mg/dL (H)). Liver Function Tests: Recent Labs  Lab 10/29/18 1023  AST 61*  ALT 46*  ALKPHOS 86  BILITOT 0.4  PROT 5.6*  ALBUMIN 1.5*   Coagulation Profile: Recent Labs  Lab 10/26/18 0800 10/27/18 0102 10/28/18 0858 10/29/18 1023 10/30/18 0420  INR 1.8* 1.7* 1.6* 1.9* 1.8*   CBG: Recent Labs  Lab 10/29/18 1139 10/29/18 1615 10/29/18 2035 10/30/18 0740 10/30/18 1149  GLUCAP 224* 194* 186* 229* 193*     Recent Results (from the past 240 hour(s))  Blood culture (routine x 2)     Status: None   Collection Time: 10/22/18  5:00 PM  Result Value Ref Range Status   Specimen Description BLOOD RIGHT ANTECUBITAL  Final   Special Requests   Final    BOTTLES DRAWN AEROBIC AND ANAEROBIC Blood Culture results may not be optimal due to an excessive volume of blood received in culture bottles   Culture   Final    NO GROWTH 5 DAYS Performed at [facility], [place] [address] [place], [place] [address]    Report Status 10/27/2018 FINAL  Final  Blood culture (routine x 2)     Status: None   Collection Time: 10/22/18  5:10 PM  Result Value Ref Range Status   Specimen Description BLOOD LEFT ANTECUBITAL  Final   Special Requests   Final    BOTTLES DRAWN AEROBIC AND ANAEROBIC Blood Culture results may not be optimal due to an excessive volume of blood received in culture bottles  Culture   Final    NO GROWTH 5 DAYS Performed at [facility], [place] [address], [place], [place] [address]    Report Status 10/27/2018 FINAL  Final         Radiology Studies: No results found.      Scheduled Meds: . aspirin EC  81 mg Oral Daily  . atorvastatin  80 mg Oral Daily  . docusate  sodium  100 mg Oral BID  . doxycycline  100 mg Oral Q12H  . DULoxetine  60 mg Oral Daily  . feeding supplement (PRO-STAT SUGAR FREE 64)  30 mL Oral TID  . fenofibrate  160 mg Oral Daily  . ferrous sulfate  325 mg Oral TID WC  . FLUoxetine  20 mg Oral Daily  . gabapentin  300 mg Oral BID  . insulin aspart  0-15 Units Subcutaneous TID WC  . insulin aspart  0-5 Units Subcutaneous QHS  . insulin aspart  8 Units Subcutaneous TID WC  . insulin glargine  45 Units Subcutaneous QHS  . levothyroxine  100 mcg Oral Daily  . metoprolol tartrate  12.5 mg Oral BID  . midodrine  5 mg Oral TID WC  . multivitamin with minerals  1 tablet Oral Daily  . tamsulosin  0.4 mg Oral Daily  . tiZANidine  4 mg Oral BID  . vitamin C  500 mg Oral BID  . warfarin  5 mg Oral ONCE-1800  . Warfarin - Pharmacist Dosing Inpatient   Does not apply q1800  . zinc sulfate  220 mg Oral Daily   Continuous Infusions: . methocarbamol (ROBAXIN) IV       LOS: 8 days     Vernell Leep, MD, FACP, [facility]. [facility]  To contact the attending provider between 7A-7P or the covering provider during after hours 7P-7A, please log into the web site [email or web address] and access using universal [place] password for that web site. If you do not have the password, please call the hospital operator.

## 2018-10-30 NOTE — Progress Notes (Addendum)
   Durable Medical Equipment (From admission, onward)       Start     Ordered  10/30/18 1446   For home use only DME lightweight manual wheelchair with seat cushion  Once   Comments:  Patient suffers from  Right AKA which impairs their ability to perform daily activities like bathing, dressing in the home.  A walker will not resolve  issue with performing activities of daily living. A wheelchair will allow patient to safely perform daily activities. Patient is not able to propel themselves in the home using a standard weight wheelchair due to weakness:[email or web address].Marland Kitchen Patient can self propel in the lightweight wheelchair.  Accessories: elevating leg rests (ELRs), wheel locks, extensions and anti-tippers.  10/30/18 1450  10/30/18 1444   For home use only DME wheelchair cushion (seat and back)  Once    10/30/18 1444  10/30/18 1440   For home use only DME Hospital bed  Once   Question Answer Comment The above medical condition requires: Patient requires the ability to reposition frequently  Head must be elevated greater than: 30 degrees  Bed type Semi-electric  Hoyer Lift Yes  Support Surface: Gel Overlay    10/30/18 1443

## 2018-10-31 DIAGNOSIS — S88111A Complete traumatic amputation at level between knee and ankle, right lower leg, initial encounter: Secondary | ICD-10-CM

## 2018-10-31 DIAGNOSIS — N183 Chronic kidney disease, stage 3 (moderate): Secondary | ICD-10-CM

## 2018-10-31 DIAGNOSIS — I82629 Acute embolism and thrombosis of deep veins of unspecified upper extremity: Secondary | ICD-10-CM

## 2018-10-31 DIAGNOSIS — N170 Acute kidney failure with tubular necrosis: Secondary | ICD-10-CM

## 2018-10-31 LAB — CBC
HCT: 23.1 % — ABNORMAL LOW (ref 39.0–52.0)
Hemoglobin: 7.1 g/dL — ABNORMAL LOW (ref 13.0–17.0)
MCH: 26.8 pg (ref 26.0–34.0)
MCHC: 30.7 g/dL (ref 30.0–36.0)
MCV: 87.2 fL (ref 80.0–100.0)
Platelets: 528 10*3/uL — ABNORMAL HIGH (ref 150–400)
RBC: 2.65 MIL/uL — ABNORMAL LOW (ref 4.22–5.81)
RDW: 16.5 % — ABNORMAL HIGH (ref 11.5–15.5)
WBC: 15.8 10*3/uL — ABNORMAL HIGH (ref 4.0–10.5)
nRBC: 0 % (ref 0.0–0.2)

## 2018-10-31 LAB — BASIC METABOLIC PANEL
Anion gap: 6 (ref 5–15)
BUN: 42 mg/dL — ABNORMAL HIGH (ref 6–20)
CO2: 25 mmol/L (ref 22–32)
Calcium: 8.3 mg/dL — ABNORMAL LOW (ref 8.9–10.3)
Chloride: 102 mmol/L (ref 98–111)
Creatinine, Ser: 1.44 mg/dL — ABNORMAL HIGH (ref 0.61–1.24)
GFR calc Af Amer: >60 mL/min (ref >60)
GFR calc non Af Amer: 54 mL/min — ABNORMAL LOW (ref >60)
Glucose, Bld: 186 mg/dL — ABNORMAL HIGH (ref 70–99)
Potassium: 5 mmol/L (ref 3.5–5.1)
Sodium: 133 mmol/L — ABNORMAL LOW (ref 135–145)

## 2018-10-31 LAB — PROTIME-INR
INR: 1.8 — ABNORMAL HIGH (ref 0.8–1.2)
Prothrombin Time: 20.7 seconds — ABNORMAL HIGH (ref 11.4–15.2)

## 2018-10-31 LAB — GLUCOSE, CAPILLARY
Glucose-Capillary: 167 mg/dL — ABNORMAL HIGH (ref 70–99)
Glucose-Capillary: 171 mg/dL — ABNORMAL HIGH (ref 70–99)
Glucose-Capillary: 107 mg/dL — ABNORMAL HIGH (ref 70–99)

## 2018-10-31 MED ORDER — PROMOD PO LIQD: 30.0000 mL | Freq: Three times a day (TID) | ORAL | 0 refills | Status: AC

## 2018-10-31 MED ORDER — FUROSEMIDE 40 MG PO TABS: 40.0000 mg | ORAL_TABLET | Freq: Every day | ORAL | 0 refills | Status: DC

## 2018-10-31 MED ORDER — INSULIN GLARGINE 100 UNIT/ML SOLOSTAR PEN: 50.0000 [IU] | PEN_INJECTOR | Freq: Every day | SUBCUTANEOUS | 0 refills | Status: DC

## 2018-10-31 MED ORDER — INSULIN ASPART 100 UNIT/ML FLEXPEN: 8.0000 [IU] | PEN_INJECTOR | Freq: Three times a day (TID) | SUBCUTANEOUS | 0 refills | Status: DC

## 2018-10-31 MED ORDER — LIRAGLUTIDE 18 MG/3ML ~~LOC~~ SOPN: 1.8000 mg | PEN_INJECTOR | Freq: Every day | SUBCUTANEOUS | 0 refills | Status: DC

## 2018-10-31 MED ORDER — FENOFIBRATE 145 MG PO TABS: 145.0000 mg | ORAL_TABLET | Freq: Every day | ORAL | 0 refills | Status: DC

## 2018-10-31 MED ORDER — LEVOTHYROXINE SODIUM 100 MCG PO TABS: 100.0000 ug | ORAL_TABLET | Freq: Every day | ORAL | 0 refills | Status: DC

## 2018-10-31 MED ORDER — INSULIN PEN NEEDLE 32G X 8 MM MISC: 0 refills | Status: DC

## 2018-10-31 MED ORDER — FLUOXETINE HCL 20 MG PO CAPS: 20.0000 mg | ORAL_CAPSULE | Freq: Every day | ORAL | 0 refills | Status: DC

## 2018-10-31 MED ORDER — MECLIZINE HCL 25 MG PO TABS: 25.0000 mg | ORAL_TABLET | Freq: Three times a day (TID) | ORAL | 0 refills | Status: DC | PRN

## 2018-10-31 MED ORDER — MIDODRINE HCL 5 MG PO TABS: 5.0000 mg | ORAL_TABLET | Freq: Three times a day (TID) | ORAL | 0 refills | Status: DC

## 2018-10-31 MED ORDER — GLUCOSE BLOOD VI STRP: ORAL_STRIP | 0 refills | Status: DC

## 2018-10-31 MED ORDER — ADULT MULTIVITAMIN W/MINERALS CH: 1.0000 | ORAL_TABLET | Freq: Every day | ORAL | 0 refills | Status: AC

## 2018-10-31 MED ORDER — DOXYCYCLINE HYCLATE 100 MG PO TABS: 100.0000 mg | ORAL_TABLET | Freq: Two times a day (BID) | ORAL | 0 refills | Status: DC

## 2018-10-31 MED ORDER — DULOXETINE HCL 60 MG PO CPEP: 60.0000 mg | ORAL_CAPSULE | Freq: Every day | ORAL | 0 refills | Status: DC

## 2018-10-31 MED ORDER — METOPROLOL TARTRATE 25 MG PO TABS: 12.5000 mg | ORAL_TABLET | Freq: Two times a day (BID) | ORAL | 0 refills | Status: DC

## 2018-10-31 MED ORDER — ATORVASTATIN CALCIUM 80 MG PO TABS: 80.0000 mg | ORAL_TABLET | Freq: Every day | ORAL | 0 refills | Status: DC

## 2018-10-31 MED ORDER — TIZANIDINE HCL 4 MG PO TABS: 4.0000 mg | ORAL_TABLET | Freq: Two times a day (BID) | ORAL | 0 refills | Status: AC

## 2018-10-31 MED ORDER — OXYCODONE HCL 5 MG PO TABS: 5.0000 mg | ORAL_TABLET | Freq: Four times a day (QID) | ORAL | 0 refills | Status: DC | PRN

## 2018-10-31 MED ORDER — WARFARIN SODIUM 5 MG PO TABS
5.0000 mg | ORAL_TABLET | Freq: Once | ORAL | Status: AC
  Administered 2018-10-31: 5 mg via ORAL
  Filled 2018-10-31: qty 1

## 2018-10-31 MED ORDER — POLYETHYLENE GLYCOL 3350 17 G PO PACK: 17.0000 g | PACK | Freq: Every day | ORAL | 0 refills | Status: DC | PRN

## 2018-10-31 MED ORDER — FERROUS SULFATE 325 (65 FE) MG PO TABS: 325.0000 mg | ORAL_TABLET | Freq: Three times a day (TID) | ORAL | 0 refills | Status: DC

## 2018-10-31 MED ORDER — FREESTYLE LANCETS MISC: 0 refills | Status: DC

## 2018-10-31 MED ORDER — GABAPENTIN 300 MG PO CAPS: 300.0000 mg | ORAL_CAPSULE | Freq: Two times a day (BID) | ORAL | 0 refills | Status: DC

## 2018-10-31 MED ORDER — ZINC SULFATE 220 (50 ZN) MG PO CAPS: 220.0000 mg | ORAL_CAPSULE | Freq: Every day | ORAL | 0 refills | Status: AC

## 2018-10-31 MED ORDER — BLOOD GLUCOSE METER KIT: PACK | 0 refills | Status: DC

## 2018-10-31 MED ORDER — VITAMIN C 500 MG PO TABS: 500.0000 mg | ORAL_TABLET | Freq: Two times a day (BID) | ORAL | 0 refills | Status: AC

## 2018-10-31 MED ORDER — TAMSULOSIN HCL 0.4 MG PO CAPS: 0.4000 mg | ORAL_CAPSULE | Freq: Every day | ORAL | 0 refills | Status: AC

## 2018-10-31 MED ORDER — WARFARIN SODIUM 5 MG PO TABS: 7.5000 mg | ORAL_TABLET | Freq: Once | ORAL | 0 refills | Status: DC

## 2018-10-31 NOTE — TOC Transition Note (Addendum)
Transition of Care [facility]) - CM/SW Discharge Note   Patient Details  Name: Ethan Campbell MRN: [id number] Date of Birth: 1960-10-09  [facility]SW Contact:  Sharin Mons, RN Phone Number: 10/31/2018, 10:33 AM   Clinical Narrative:    Transition to home today with home health services to follow. [facility] will provide home health services. Pt to d/c with wound vac/Praveena. Awaiting delivery of hospital bed and W/C to pt's home. Once equipment has delivered pt can be discharged to home. PTAR services will be needed for transportation to home. Pt to f/u with PCP on tomorrow for hospital f/u and INR check @ 9am, NCM noted on AVS and made wife aware.   Final next level of care: [place] Barriers to Discharge: Continued Medical Work up    10/31/2018 @ 11:30 am NCM received call from [place] liaison and was informed pt's equipment scheduled for delivery between 2-6pm today.  Patient Goals and CMS Choice Patient states their goals for this hospitalization and ongoing recovery are:: pt wants to go home if able, but agreeable to [facility] in [place] if needed [email or web address] Compare Post Acute Care list provided to:: (spouse) Choice offered to / list presented to : Spouse  Discharge Placement                       Discharge Plan and Services In-house Referral: Clinical Social Work Discharge Planning Services: CM Consult Post Acute Care Choice: Home Health          DME Arranged: Hospital bed, Wheelchair manual DME Agency: [facility] HH Arranged: RN, PT [place] Agency: [facility] ([facility])   Social Determinants of Health (SDOH) Interventions     Readmission Risk Interventions Readmission Risk Prevention Plan 10/25/2018  Transportation Screening Complete  PCP or Specialist Appt within 3-5 Days Complete  HRI or Home Care Consult Complete  HRI or Home Care Consult comments N/A  Social Work Consult for [place]  Planning/Counseling Complete  Palliative Care Screening Not Applicable  Medication Review Press photographer) Complete  Some recent data might be hidden

## 2018-10-31 NOTE — Progress Notes (Signed)
ANTICOAGULATION CONSULT NOTE -follow up  Pharmacy Consult for warfarin Indication: aflutter and history of DVT  No Known Allergies  Patient Measurements: Height: 5\' 9"  (175.3 cm) Weight: 279 lb 5.2 oz (126.7 kg) IBW/kg (Calculated) : 70.7 Heparin Dosing Weight: 103.9 kg  Vital Signs: Temp: 98.6 F (37 C) (04/08 0335) Temp Source: Oral (04/08 0335) BP: 142/60 (04/08 0335) Pulse Rate: 69 (04/08 0335)  Labs: Recent Labs    10/29/18 0656 10/29/18 1023 10/30/18 0420 10/31/18 0355  HGB 6.4*  --  7.6* 7.1*  HCT 20.3*  --  24.2* 23.1*  PLT 510*  --  504* 528*  LABPROT  --  21.9* 20.9* 20.7*  INR  --  1.9* 1.8* 1.8*  CREATININE 1.90*  --  1.54* 1.44*    Estimated Creatinine Clearance: 74.5 mL/min (A) (by C-G formula based on SCr of 1.44 mg/dL (H)).  Assessment: 32 yoM on warfarin PTA for atrial flutter/history of DVT. Patient is s/p right AKA on 4/3.   Anticoag: h/o of LUE DVT and appears continued on warfarin for aflutter per cards at [facility].  Warfarin held on admit 2/2 need R AKA S/p AKA 4/3 , waf resumed 4/4.  PTA dose: warfarin 10 mg PO every MWF, and 7.5 mg PO on all other days of the week  INR slightly subtherapeutic at 1.8, D#3 doxycycline, H/H stable.    Goal of Therapy:  INR 2-3 Monitor platelets by anticoagulation protocol: Yes   Plan:  Warfarin 5mg  PO x 1 tonight Daily INR, s/s bleeding  Bertis Ruddy, PharmD Clinical Pharmacist Please check AMION for all [place] numbers 10/31/2018 8:38 AM

## 2018-10-31 NOTE — Discharge Summary (Signed)
PATIENT DETAILS Name: Ethan Campbell Age: 58 y.o. Sex: male Date of Birth: 06/08/1961 MRN: [id number]. Admitting Physician: Ivor Costa, MD GBT:DVVOH, Levada Dy, NP  Admit Date: 10/22/2018 Discharge date: 10/31/2018  Recommendations for Outpatient Follow-up:  1. INR/CBC to be repeated tomorrow desk tomorrow at PCPs office. 2. Adjust/optimize dosing of Coumadin-patient on doxycycline-need to be careful with interaction. 3. Currently not on insulin pump-continue Lantus/NovoLog-Ensure follow-up with endocrinology to see if patient can be restarted on insulin pump 4. Ensure follow-up with orthopedics   Admitted From:  SNF  Disposition: Home with home health services (family refused SNF)   Home Health: Yes  Equipment/Devices: Prevana Pump  Discharge Condition: Stable  CODE STATUS: FULL CODE  Diet recommendation:  Heart Healthy / Carb Modified   Brief Summary: See H&P, Labs, Consult and Test reports for all details in brief, Patient is a 58 year old male with history of hypertension, hyperlipidemia, diabetes type 2 on insulin pump, hypothyroidism, depression, left arm DVT on Coumadin, bone cancer, OSA on CPAP, CKD stage III who presented from SNF with complaints of right leg wound infection. Patient just had BKA of right lower extremity by Orthopedics on 10/04/2018.  Wound VAC was taken down 6 days PTA.  He had fever of 103 F and chills at skilled nursing facility.  Patient stated that he had contact with Covid-19 positive health worker at the facility.  Evaluated by orthopedics-underwent AKA on 4/3.  Postop course complicated by hypotension, ATN, worsening anemia-required multiple units of PRBC transfusion, hyperkalemia.  Subsequently stabilized-COVID-19 test done by SNF was reportedly negative.  Family refused SNF on discharge-being discharged to home with maximum home health services.  See below for further details.  Brief Hospital Course: Sepsis secondary to postop wound  infection of the right BKA stump with gangrenous dehiscence:  Recent BKA prior to this hospitalization-evaluated by orthopedics-underwent AKA on 4/3.  Initially on broad-spectrum IV antimicrobial therapy-recommendations from orthopedics are for 4 weeks of doxycycline on discharge.  Sepsis pathophysiology has resolved, blood cultures negative.  Spoke with Dr. Sharol Given this morning-continue wound VAC-and make arrangements for home health services on discharge.  Dr. Jess Barters office will arrange for outpatient follow-up.  Continue weightbearing as tolerated on the left lower extremity.  Suspicion for Covid-19: Patient had a exposure at [facility] in [place]-COVID 19 test that was done at [facility] prior to this hospitalization was negative per Dr. Gasper Lloyd  last note on 4/7.  AKI on CKD stage III: AKI likely hemodynamically mediated-probably ATN due to hypotension that patient developed postoperatively.  Creatinine has improved with supportive care-continue to follow electrolytes periodically-defer to attending MD/PCP  Anemia: Felt to be multifactorial in the setting of anemia related to CKD-postop acute blood loss anemia acute/critical illness.  Has required multiple units of PRBC transfusion this admission.  No indication of hemolysis.  Brown stools-no history of bleeding as well.  Currently patient appears to be asymptomatic-have asked patient/spouse (who I talked to over the phone) to recheck CBC over the next few days.  A. fib with RVR: Rate controlled-prior MD discussed case with cardiology-continue metoprolol-on Coumadin.  History of DVT: Apparently had a left upper extremity DVT 1 year back-however suspect anticoagulation is continued due to patient's history of A. fib.  INR target between 2 and 3-currently at 1.8-Case management has arranged for repeat INR to be done tomorrow at PCPs office.  Insulin-dependent DM-2: Hospital course complicated by an episode of hypoglycemia-he was taken off the insulin  pump and started on Lantus and NovoLog.  Case was discussed with the diabetic coordinator today over the phone-since patient's basal insulin is significantly less than what he was getting through his pump-recommendations are to discharge on Lantus/NovoLog.  This was discussed with the patient spouse over the phone-she is aware that she needs to keep a record of his CBGs-and then discuss with his primary endocrinologist whether or not patient can be restarted on the insulin pump.  Hypertension: Renew metoprolol-resume Lasix on discharge.  Has history of orthostatic hypotension and is on midodrine.  Further optimization deferred to his outpatient physicians.  Hypothyroidism: Continue Synthroid  Chronic diastolic heart failure: Clinically compensated-resume Lasix on discharge  History of syncope/sick sinus syndrome: Has permanent pacemaker in place-follow[facility]  OSA: Resume CPAP on discharge  Morbid obesity  Procedures/Studies: 4/3>>Right AKA with wound VAC placement   Discharge Diagnoses:  Principal Problem:   Wound infection after surgery Active Problems:   Essential hypertension, benign   Acute renal failure superimposed on stage 3 chronic kidney disease (HCC)   HLD (hyperlipidemia)   Below-knee amputation of right lower extremity ([place])   Uncontrolled type 2 diabetes mellitus with polyneuropathy (HCC)   Normocytic anemia   Sepsis ([place])   DVT (deep venous thrombosis) ([place])   Hypothyroidism   Close Exposure to Covid-19 Virus   Dehiscence of amputation stump ([place])   Severe protein-calorie malnutrition ([place])   Discharge Instructions:  Activity:  As tolerated with Full fall precautions use walker/cane & assistance as needed  Discharge Instructions    Call MD for:  redness, tenderness, or signs of infection (pain, swelling, redness, odor or green/yellow discharge around incision site)   Complete by:  As directed    Diet - low sodium heart healthy    Complete by:  As directed    Diet Carb Modified   Complete by:  As directed    Discharge instructions   Complete by:  As directed    Follow with Primary MD  Arsenio Katz, NP in 1 week  Follow with primary endocrinologist-Dr. Dwyane Dee in 1 week  Follow with Dr. Sharol Given in the next 1-2 weeks  Check CBGs before meals and at bedtime-please keep a log of these readings and take it to your primary endocrinologist's office  Please ask your primary care MD to check INR and CBC in the next few days.  Your primary and we will need to adjust dosage of Coumadin accordingly.    Please get a complete blood count and chemistry panel checked by your Primary MD at your next visit, and again as instructed by your Primary MD.  Get Medicines reviewed and adjusted: Please take all your medications with you for your next visit with your Primary MD  Laboratory/radiological data: Please request your Primary MD to go over all hospital tests and procedure/radiological results at the follow up, please ask your Primary MD to get all Hospital records sent to his/her office.  In some cases, they will be blood work, cultures and biopsy results pending at the time of your discharge. Please request that your primary care M.D. follows up on these results.  Also Note the following: If you experience worsening of your admission symptoms, develop shortness of breath, life threatening emergency, suicidal or homicidal thoughts you must seek medical attention immediately by calling 911 or calling your MD immediately  if symptoms less severe.  You must read complete instructions/literature along with all the possible adverse reactions/side effects for all the Medicines you take and that have been prescribed  to you. Take any new Medicines after you have completely understood and accpet all the possible adverse reactions/side effects.   Do not drive when taking Pain medications or sleeping medications (Benzodaizepines)  Do not  take more than prescribed Pain, Sleep and Anxiety Medications. It is not advisable to combine anxiety,sleep and pain medications without talking with your primary care practitioner  Special Instructions: If you have smoked or chewed Tobacco  in the last 2 yrs please stop smoking, stop any regular Alcohol  and or any Recreational drug use.  Wear Seat belts while driving.  Please note: You were cared for by a hospitalist during your hospital stay. Once you are discharged, your primary care physician will handle any further medical issues. Please note that NO REFILLS for any discharge medications will be authorized once you are discharged, as it is imperative that you return to your primary care physician (or establish a relationship with a primary care physician if you do not have one) for your post hospital discharge needs so that they can reassess your need for medications and monitor your lab values.   Increase activity slowly   Complete by:  As directed      Allergies as of 10/31/2018   No Known Allergies     Medication List    STOP taking these medications   benazepril 20 MG tablet Commonly known as:  LOTENSIN   HUMULIN R 500 UNIT/ML injection Generic drug:  insulin regular human CONCENTRATED   OmniPod 5 Pack Misc   oxyCODONE-acetaminophen 5-325 MG tablet Commonly known as:  PERCOCET/ROXICET     TAKE these medications   acetaminophen 325 MG tablet Commonly known as:  TYLENOL Take 650 mg by mouth every 4 (four) hours as needed (fever of 101 or higher). What changed:  Another medication with the same name was removed. Continue taking this medication, and follow the directions you see here.   aspirin EC 81 MG tablet Take 81 mg by mouth daily.   atorvastatin 80 MG tablet Commonly known as:  LIPITOR Take 1 tablet (80 mg total) by mouth daily.   blood glucose meter kit and supplies Dispense based on patient and insurance preference. Use up to four times daily as directed. (FOR  ICD-10 E10.9, E11.9).   doxycycline 100 MG tablet Commonly known as:  VIBRA-TABS Take 1 tablet (100 mg total) by mouth 2 (two) times daily for 26 days.   DULoxetine 60 MG capsule Commonly known as:  CYMBALTA Take 1 capsule (60 mg total) by mouth daily.   fenofibrate 145 MG tablet Commonly known as:  TRICOR Take 1 tablet (145 mg total) by mouth daily.   ferrous sulfate 325 (65 FE) MG tablet Take 1 tablet (325 mg total) by mouth 3 (three) times daily with meals for 30 days.   FLUoxetine 20 MG capsule Commonly known as:  PROZAC Take 1 capsule (20 mg total) by mouth daily.   freestyle lancets Use as instructed   furosemide 40 MG tablet Commonly known as:  LASIX Take 1 tablet (40 mg total) by mouth daily for 30 days. What changed:  when to take this   gabapentin 300 MG capsule Commonly known as:  NEURONTIN Take 1 capsule (300 mg total) by mouth 2 (two) times daily.   glucose blood test strip Commonly known as:  Accu-Chek Aviva Plus TEST UP TO 4 TIMES DAILY Dx code E11.65 What changed:  Another medication with the same name was added. Make sure you understand how and when to  take each.   glucose blood test strip Commonly known as:  FREESTYLE LITE Check sugar 3 times daily What changed:  Another medication with the same name was added. Make sure you understand how and when to take each.   glucose blood test strip Commonly known as:  FREESTYLE TEST STRIPS Use as instructed What changed:  You were already taking a medication with the same name, and this prescription was added. Make sure you understand how and when to take each.   insulin aspart 100 UNIT/ML FlexPen Commonly known as:  NovoLOG FlexPen Inject 8 Units into the skin 3 (three) times daily with meals for 30 days.   Insulin Glargine 100 UNIT/ML Solostar Pen Commonly known as:  LANTUS Inject 50 Units into the skin daily at 10 pm for 30 days.   Insulin Pen Needle 32G X 8 MM Misc Use as directed   levothyroxine  100 MCG tablet Commonly known as:  SYNTHROID, LEVOTHROID Take 1 tablet (100 mcg total) by mouth daily. Take 1 tablet daily before breakfast   liraglutide 18 MG/3ML Sopn Commonly known as:  Victoza Inject 0.3 mLs (1.8 mg total) into the skin daily for 30 days.   meclizine 25 MG tablet Commonly known as:  ANTIVERT Take 1 tablet (25 mg total) by mouth every 8 (eight) hours as needed for dizziness.   metoprolol tartrate 25 MG tablet Commonly known as:  LOPRESSOR Take 0.5 tablets (12.5 mg total) by mouth 2 (two) times daily for 30 days.   midodrine 5 MG tablet Commonly known as:  PROAMATINE Take 1 tablet (5 mg total) by mouth 3 (three) times daily for 30 days.   multivitamin with minerals Tabs tablet Take 1 tablet by mouth daily for 30 days.   oxyCODONE 5 MG immediate release tablet Commonly known as:  Oxy IR/ROXICODONE Take 1 tablet (5 mg total) by mouth every 6 (six) hours as needed for severe pain (pain score 7-10).   polyethylene glycol packet Commonly known as:  MIRALAX / GLYCOLAX Take 17 g by mouth daily as needed for mild constipation.   PRESCRIPTION MEDICATION Inhale into the lungs at bedtime. CPAP   ProMod Liqd Take 30 mLs by mouth 3 (three) times daily for 30 days.   tamsulosin 0.4 MG Caps capsule Commonly known as:  FLOMAX Take 1 capsule (0.4 mg total) by mouth daily for 30 days.   tiZANidine 4 MG tablet Commonly known as:  ZANAFLEX Take 1 tablet (4 mg total) by mouth 2 (two) times daily for 30 days.   vitamin C 500 MG tablet Commonly known as:  ASCORBIC ACID Take 1 tablet (500 mg total) by mouth 2 (two) times daily for 30 days.   warfarin 5 MG tablet Commonly known as:  COUMADIN Take 1.5 tablets (7.5 mg total) by mouth one time only at 6 PM for 30 doses. Take one tablet (7.5 mg) by  at 6 pm What changed:    medication strength  when to take this  additional instructions  Another medication with the same name was removed. Continue taking this  medication, and follow the directions you see here.   zinc sulfate 220 (50 Zn) MG capsule Take 1 capsule (220 mg total) by mouth daily for 30 days.            Durable Medical Equipment  (From admission, onward)         Start     Ordered   10/30/18 1446  For home use only DME lightweight manual  wheelchair with seat cushion  Once    Comments:  Patient suffers from  Right AKA which impairs their ability to perform daily activities like bathing, dressing in the home.  A walker will not resolve  issue with performing activities of daily living. A wheelchair will allow patient to safely perform daily activities. Patient is not able to propel themselves in the home using a standard weight wheelchair due to weakness:[email or web address].Marland Kitchen Patient can self propel in the lightweight wheelchair.  Accessories: elevating leg rests (ELRs), wheel locks, extensions and anti-tippers.   10/30/18 1450   10/30/18 1444  For home use only DME wheelchair cushion (seat and back)  Once     10/30/18 1444   10/30/18 1440  For home use only DME Hospital bed  Once    Question Answer Comment  The above medical condition requires: Patient requires the ability to reposition frequently   Head must be elevated greater than: 30 degrees   Bed type Semi-electric   Hoyer Lift Yes   Support Surface: Gel Overlay      10/30/18 1443         Follow-up Information    Newt Minion, MD In 1 week.   Specialty:  Orthopedic Surgery Contact information: [place] [address] [phone]        [facility] Follow up.   Why:  hospital bed, wheelchair       [facility] Follow up on 11/01/2018.   Why:   Hospital f/u and CBC/INR check scheduled for 9am, 11/01/2018 Contact information: [facility]  Address: [address] [place], [place] [address] Phone: [phone]       Elayne Snare, MD. Schedule an appointment as soon as possible for a visit in 1 week(s).    Specialty:  Endocrinology Contact information: [address] [place] [address] [phone]        Arsenio Katz, NP. Schedule an appointment as soon as possible for a visit in 1 week(s).   Specialty:  Nurse Practitioner Contact information: [place] [address] [phone]          No Known Allergies  Consultations:   orthopedic surgery   Other Procedures/Studies: Dg Femur Portable Min 2 Views Right  Result Date: 10/22/2018 CLINICAL DATA:  Right leg pain EXAM: RIGHT FEMUR PORTABLE 2 VIEW COMPARISON:  None. FINDINGS: There is no evidence of fracture or other focal bone lesions. Soft tissues are unremarkable. Right BKA with skin staples. Expected postoperative appearance. IMPRESSION: Expected postoperative appearance of right BKA.  No acute findings. Electronically Signed   By: Ulyses Jarred M.D.   On: 10/22/2018 19:15      TODAY-DAY OF DISCHARGE:  Subjective:   Ethan Campbell today has no headache,no chest abdominal pain,no new weakness tingling or numbness, feels much better wants to go home today.   Objective:   Blood pressure (!) 172/56, pulse 78, temperature 98.8 F (37.1 C), temperature source Oral, resp. rate 16, height '5\' 9"'  (1.753 m), weight 126.7 kg, SpO2 97 %.  Intake/Output Summary (Last 24 hours) at 10/31/2018 1023 Last data filed at 10/31/2018 1017 Gross per 24 hour  Intake 840 ml  Output 3600 ml  Net -2760 ml   Filed Weights   10/22/18 1627 10/22/18 2350 10/31/18 0337  Weight: (!) 140.2 kg 128.7 kg 126.7 kg    Exam: Awake Alert, Oriented *3, No new F.N deficits, Normal affect [place].AT,PERRAL Supple Neck,No JVD, No cervical lymphadenopathy  appriciated.  Symmetrical Chest wall movement, Good air movement bilaterally, CTAB RRR,No Gallops,Rubs or new Murmurs, No Parasternal Heave +ve B.Sounds, Abd Soft, Non tender, No organomegaly appriciated, No rebound -guarding or rigidity. No Cyanosis, Clubbing or edema, No new Rash  or bruise   PERTINENT RADIOLOGIC STUDIES: Dg Femur Portable Min 2 Views Right  Result Date: 10/22/2018 CLINICAL DATA:  Right leg pain EXAM: RIGHT FEMUR PORTABLE 2 VIEW COMPARISON:  None. FINDINGS: There is no evidence of fracture or other focal bone lesions. Soft tissues are unremarkable. Right BKA with skin staples. Expected postoperative appearance. IMPRESSION: Expected postoperative appearance of right BKA.  No acute findings. Electronically Signed   By: Ulyses Jarred M.D.   On: 10/22/2018 19:15     PERTINENT LAB RESULTS: CBC: Recent Labs    10/30/18 0420 10/31/18 0355  WBC 12.7* 15.8*  HGB 7.6* 7.1*  HCT 24.2* 23.1*  PLT 504* 528*   CMET CMP     Component Value Date/Time   NA 133 (L) 10/31/2018 0355   NA 138 04/26/2017 1415   K 5.0 10/31/2018 0355   CL 102 10/31/2018 0355   CO2 25 10/31/2018 0355   GLUCOSE 186 (H) 10/31/2018 0355   BUN 42 (H) 10/31/2018 0355   BUN 21 04/26/2017 1415   CREATININE 1.44 (H) 10/31/2018 0355   CALCIUM 8.3 (L) 10/31/2018 0355   PROT 5.6 (L) 10/29/2018 1023   ALBUMIN 1.5 (L) 10/29/2018 1023   AST 61 (H) 10/29/2018 1023   ALT 46 (H) 10/29/2018 1023   ALKPHOS 86 10/29/2018 1023   BILITOT 0.4 10/29/2018 1023   GFRNONAA 54 (L) 10/31/2018 0355   GFRAA >60 10/31/2018 0355    GFR Estimated Creatinine Clearance: 74.5 mL/min (A) (by C-G formula based on SCr of 1.44 mg/dL (H)). No results for input(s): LIPASE, AMYLASE in the last 72 hours. No results for input(s): CKTOTAL, CKMB, CKMBINDEX, TROPONINI in the last 72 hours. Invalid input(s): POCBNP No results for input(s): DDIMER in the last 72 hours. No results for input(s): HGBA1C in the last 72 hours. No results for input(s): CHOL, HDL, LDLCALC, TRIG, CHOLHDL, LDLDIRECT in the last 72 hours. No results for input(s): TSH, T4TOTAL, T3FREE, THYROIDAB in the last 72 hours.  Invalid input(s): FREET3 Recent Labs    10/29/18 1020 10/29/18 1023  VITAMINB12  --  586  FOLATE 6.2  --   FERRITIN   --  155  TIBC  --  197*  IRON  --  25*  RETICCTPCT  --  2.8   Coags: Recent Labs    10/30/18 0420 10/31/18 0355  INR 1.8* 1.8*   Microbiology: Recent Results (from the past 240 hour(s))  Blood culture (routine x 2)     Status: None   Collection Time: 10/22/18  5:00 PM  Result Value Ref Range Status   Specimen Description BLOOD RIGHT ANTECUBITAL  Final   Special Requests   Final    BOTTLES DRAWN AEROBIC AND ANAEROBIC Blood Culture results may not be optimal due to an excessive volume of blood received in culture bottles   Culture   Final    NO GROWTH 5 DAYS Performed at [facility] [address] [place], [place] [address]    Report Status 10/27/2018 FINAL  Final  Blood culture (routine x 2)     Status: None   Collection Time: 10/22/18  5:10 PM  Result Value Ref Range Status   Specimen Description BLOOD LEFT ANTECUBITAL  Final   Special Requests  Final    BOTTLES DRAWN AEROBIC AND ANAEROBIC Blood Culture results may not be optimal due to an excessive volume of blood received in culture bottles   Culture   Final    NO GROWTH 5 DAYS Performed at [facility] [address], [place], [place] [address]    Report Status 10/27/2018 FINAL  Final    FURTHER DISCHARGE INSTRUCTIONS:  Get Medicines reviewed and adjusted: Please take all your medications with you for your next visit with your Primary MD  Laboratory/radiological data: Please request your Primary MD to go over all hospital tests and procedure/radiological results at the follow up, please ask your Primary MD to get all Hospital records sent to his/her office.  In some cases, they will be blood work, cultures and biopsy results pending at the time of your discharge. Please request that your primary care M.D. goes through all the records of your hospital data and follows up on these results.  Also Note the following: If you experience worsening of your admission symptoms, develop shortness of breath,  life threatening emergency, suicidal or homicidal thoughts you must seek medical attention immediately by calling 911 or calling your MD immediately  if symptoms less severe.  You must read complete instructions/literature along with all the possible adverse reactions/side effects for all the Medicines you take and that have been prescribed to you. Take any new Medicines after you have completely understood and accpet all the possible adverse reactions/side effects.   Do not drive when taking Pain medications or sleeping medications (Benzodaizepines)  Do not take more than prescribed Pain, Sleep and Anxiety Medications. It is not advisable to combine anxiety,sleep and pain medications without talking with your primary care practitioner  Special Instructions: If you have smoked or chewed Tobacco  in the last 2 yrs please stop smoking, stop any regular Alcohol  and or any Recreational drug use.  Wear Seat belts while driving.  Please note: You were cared for by a hospitalist during your hospital stay. Once you are discharged, your primary care physician will handle any further medical issues. Please note that NO REFILLS for any discharge medications will be authorized once you are discharged, as it is imperative that you return to your primary care physician (or establish a relationship with a primary care physician if you do not have one) for your post hospital discharge needs so that they can reassess your need for medications and monitor your lab values.  Total Time spent coordinating discharge including counseling, education and face to face time equals 35 minutes.  SignedOren Binet 10/31/2018 10:23 AM

## 2018-10-31 NOTE — Progress Notes (Signed)
Physical Therapy Treatment Patient Details Name: Ethan Campbell MRN: [id number] DOB: 07/05/1961 Today's Date: 10/31/2018    History of Present Illness 58 y.o. male admitted on 10/22/18 with fever and chills, recent R BKA that was gangrenous and had to be converted to an AKA on 10/26/18.  Pt was being tested for COVID-19 at the SNF where he was getting rehab (swabbed 10/22/18). As of initial PT evaluation on 10/29/18 results are pending.  Pt with significant PMH of R BKA on 10/05/18, DM2, seizure, sick sinus syndrome, obesity, essential HTN, CKD stage 3, L TKA.       PT Comments    Pt making steady progress with functional mobility. Pt would continue to benefit from skilled physical therapy services at this time while admitted and after d/c to address the below listed limitations in order to improve overall safety and independence with functional mobility.    Follow Up Recommendations  SNF     Equipment Recommendations  None recommended by PT    Recommendations for Other Services       Precautions / Restrictions Precautions Precautions: Fall Precaution Comments: wound vac Restrictions Weight Bearing Restrictions: Yes RLE Weight Bearing: Non weight bearing    Mobility  Bed Mobility Overal bed mobility: Modified Independent                Transfers                 General transfer comment: focus of session was on strengthening therex  Ambulation/Gait                 Stairs             Wheelchair Mobility    Modified Rankin (Stroke Patients Only)       Balance Overall balance assessment: Needs assistance Sitting-balance support: Feet supported;Bilateral upper extremity supported Sitting balance-Leahy Scale: Poor Sitting balance - Comments: reliant on UE supports                                    Cognition Arousal/Alertness: Awake/alert Behavior During Therapy: WFL for tasks assessed/performed Overall Cognitive Status:  Within Functional Limits for tasks assessed                                        Exercises Amputee Exercises Gluteal Sets: AROM;Strengthening;Both;10 reps;Supine Hip ABduction/ADduction: AROM;Strengthening;Right;10 reps;Supine Hip Flexion/Marching: AROM;Strengthening;Right;10 reps;Supine Other Exercises Other Exercises: PT demonstrated and instructed pt in chair push-ups which he acknowledged understanding of Other Exercises: pt performed single LE glute bridges in supine with L LE x10 reps Other Exercises: PT demonstrated and instructed pt in initiation of gentle sensory input to R residual limb - pt with good teachback and demonstration    General Comments        Pertinent Vitals/Pain Pain Assessment: 0-10 Pain Score: 8  Pain Location: R residual limb Pain Descriptors / Indicators: Sore Pain Intervention(s): Monitored during session    Home Living                      Prior Function            PT Goals (current goals can now be found in the care plan section) Acute Rehab PT Goals PT Goal Formulation: With patient Time For Goal Achievement: 11/12/18 Potential to  Achieve Goals: Good Progress towards PT goals: Progressing toward goals    Frequency    Min 3X/week      PT Plan Current plan remains appropriate    Co-evaluation              AM-PAC PT "6 Clicks" Mobility   Outcome Measure  Help needed turning from your back to your side while in a flat bed without using bedrails?: None Help needed moving from lying on your back to sitting on the side of a flat bed without using bedrails?: None Help needed moving to and from a bed to a chair (including a wheelchair)?: A Little Help needed standing up from a chair using your arms (e.g., wheelchair or bedside chair)?: A Little Help needed to walk in hospital room?: Total Help needed climbing 3-5 steps with a railing? : Total 6 Click Score: 16    End of Session   Activity  Tolerance: Patient limited by pain Patient left: in bed;with call bell/phone within reach Nurse Communication: Mobility status PT Visit Diagnosis: Muscle weakness (generalized) (M62.81);Difficulty in walking, not elsewhere classified (R26.2);Pain Pain - Right/Left: Right Pain - part of body: Leg     Time: 1355-1407 PT Time Calculation (min) (ACUTE ONLY): 12 min  Charges:  $Therapeutic Exercise: 8-22 mins                     Sherie Don, PT, DPT  Acute Rehabilitation Services Pager [phone] Office [place] 10/31/2018, 2:46 PM

## 2018-11-01 ENCOUNTER — Other Ambulatory Visit (HOSPITAL_COMMUNITY)
Admission: RE | Admit: 2018-11-01 | Discharge: 2018-11-01 | Disposition: A | Discharge status: Home / Self Care | Payer: Medicaid Other | Source: Ambulatory Visit | Attending: Internal Medicine | Admitting: Internal Medicine

## 2018-11-01 DIAGNOSIS — R6889 Other general symptoms and signs: Secondary | ICD-10-CM | POA: Insufficient documentation

## 2018-11-01 LAB — CBC
HCT: 25.2 % — ABNORMAL LOW (ref 39.0–52.0)
Hemoglobin: 7.7 g/dL — ABNORMAL LOW (ref 13.0–17.0)
MCH: 26.6 pg (ref 26.0–34.0)
MCHC: 30.6 g/dL (ref 30.0–36.0)
MCV: 87.2 fL (ref 80.0–100.0)
Platelets: 549 10*3/uL — ABNORMAL HIGH (ref 150–400)
RBC: 2.89 MIL/uL — ABNORMAL LOW (ref 4.22–5.81)
RDW: 16.6 % — ABNORMAL HIGH (ref 11.5–15.5)
WBC: 13.8 10*3/uL — ABNORMAL HIGH (ref 4.0–10.5)
nRBC: 0 % (ref 0.0–0.2)

## 2018-11-06 ENCOUNTER — Inpatient Hospital Stay (HOSPITAL_COMMUNITY)
Admission: AD | Admit: 2018-11-06 | Discharge: 2018-11-12 | DRG: 474 | Disposition: A | Discharge status: Home Health | Payer: Medicaid Other | Source: Other Acute Inpatient Hospital | Attending: Orthopedic Surgery | Admitting: Orthopedic Surgery

## 2018-11-06 ENCOUNTER — Telehealth (INDEPENDENT_AMBULATORY_CARE_PROVIDER_SITE_OTHER): Payer: Self-pay | Admitting: Orthopedic Surgery

## 2018-11-06 ENCOUNTER — Encounter (HOSPITAL_COMMUNITY): Payer: Self-pay | Admitting: General Practice

## 2018-11-06 ENCOUNTER — Other Ambulatory Visit: Payer: Self-pay

## 2018-11-06 DIAGNOSIS — I5032 Chronic diastolic (congestive) heart failure: Secondary | ICD-10-CM | POA: Diagnosis present

## 2018-11-06 DIAGNOSIS — D62 Acute posthemorrhagic anemia: Secondary | ICD-10-CM | POA: Diagnosis present

## 2018-11-06 DIAGNOSIS — Z950 Presence of cardiac pacemaker: Secondary | ICD-10-CM | POA: Diagnosis not present

## 2018-11-06 DIAGNOSIS — S78111A Complete traumatic amputation at level between right hip and knee, initial encounter: Secondary | ICD-10-CM | POA: Diagnosis not present

## 2018-11-06 DIAGNOSIS — F329 Major depressive disorder, single episode, unspecified: Secondary | ICD-10-CM | POA: Diagnosis present

## 2018-11-06 DIAGNOSIS — D6832 Hemorrhagic disorder due to extrinsic circulating anticoagulants: Secondary | ICD-10-CM | POA: Diagnosis present

## 2018-11-06 DIAGNOSIS — Z79891 Long term (current) use of opiate analgesic: Secondary | ICD-10-CM

## 2018-11-06 DIAGNOSIS — N183 Chronic kidney disease, stage 3 unspecified: Secondary | ICD-10-CM | POA: Diagnosis present

## 2018-11-06 DIAGNOSIS — E1165 Type 2 diabetes mellitus with hyperglycemia: Secondary | ICD-10-CM | POA: Diagnosis present

## 2018-11-06 DIAGNOSIS — I130 Hypertensive heart and chronic kidney disease with heart failure and stage 1 through stage 4 chronic kidney disease, or unspecified chronic kidney disease: Secondary | ICD-10-CM | POA: Diagnosis present

## 2018-11-06 DIAGNOSIS — M96840 Postprocedural hematoma of a musculoskeletal structure following a musculoskeletal system procedure: Secondary | ICD-10-CM | POA: Diagnosis present

## 2018-11-06 DIAGNOSIS — Z8673 Personal history of transient ischemic attack (TIA), and cerebral infarction without residual deficits: Secondary | ICD-10-CM

## 2018-11-06 DIAGNOSIS — I82722 Chronic embolism and thrombosis of deep veins of left upper extremity: Secondary | ICD-10-CM | POA: Diagnosis not present

## 2018-11-06 DIAGNOSIS — G4733 Obstructive sleep apnea (adult) (pediatric): Secondary | ICD-10-CM | POA: Diagnosis present

## 2018-11-06 DIAGNOSIS — IMO0002 Reserved for concepts with insufficient information to code with codable children: Secondary | ICD-10-CM | POA: Diagnosis present

## 2018-11-06 DIAGNOSIS — Z794 Long term (current) use of insulin: Secondary | ICD-10-CM

## 2018-11-06 DIAGNOSIS — T45515S Adverse effect of anticoagulants, sequela: Secondary | ICD-10-CM

## 2018-11-06 DIAGNOSIS — Z89612 Acquired absence of left leg above knee: Secondary | ICD-10-CM

## 2018-11-06 DIAGNOSIS — Y835 Amputation of limb(s) as the cause of abnormal reaction of the patient, or of later complication, without mention of misadventure at the time of the procedure: Secondary | ICD-10-CM | POA: Diagnosis present

## 2018-11-06 DIAGNOSIS — Z86718 Personal history of other venous thrombosis and embolism: Secondary | ICD-10-CM

## 2018-11-06 DIAGNOSIS — T8789 Other complications of amputation stump: Principal | ICD-10-CM | POA: Diagnosis present

## 2018-11-06 DIAGNOSIS — I10 Essential (primary) hypertension: Secondary | ICD-10-CM | POA: Diagnosis present

## 2018-11-06 DIAGNOSIS — T45515A Adverse effect of anticoagulants, initial encounter: Secondary | ICD-10-CM | POA: Diagnosis present

## 2018-11-06 DIAGNOSIS — E43 Unspecified severe protein-calorie malnutrition: Secondary | ICD-10-CM

## 2018-11-06 DIAGNOSIS — E1151 Type 2 diabetes mellitus with diabetic peripheral angiopathy without gangrene: Secondary | ICD-10-CM | POA: Diagnosis present

## 2018-11-06 DIAGNOSIS — I4891 Unspecified atrial fibrillation: Secondary | ICD-10-CM | POA: Diagnosis present

## 2018-11-06 DIAGNOSIS — E1143 Type 2 diabetes mellitus with diabetic autonomic (poly)neuropathy: Secondary | ICD-10-CM | POA: Diagnosis present

## 2018-11-06 DIAGNOSIS — G40909 Epilepsy, unspecified, not intractable, without status epilepticus: Secondary | ICD-10-CM | POA: Diagnosis present

## 2018-11-06 DIAGNOSIS — E1122 Type 2 diabetes mellitus with diabetic chronic kidney disease: Secondary | ICD-10-CM | POA: Diagnosis present

## 2018-11-06 DIAGNOSIS — E785 Hyperlipidemia, unspecified: Secondary | ICD-10-CM | POA: Diagnosis present

## 2018-11-06 DIAGNOSIS — Z6841 Body Mass Index (BMI) 40.0 and over, adult: Secondary | ICD-10-CM

## 2018-11-06 DIAGNOSIS — Z89611 Acquired absence of right leg above knee: Secondary | ICD-10-CM

## 2018-11-06 DIAGNOSIS — N170 Acute kidney failure with tubular necrosis: Secondary | ICD-10-CM | POA: Diagnosis not present

## 2018-11-06 DIAGNOSIS — N179 Acute kidney failure, unspecified: Secondary | ICD-10-CM | POA: Diagnosis present

## 2018-11-06 DIAGNOSIS — I495 Sick sinus syndrome: Secondary | ICD-10-CM | POA: Diagnosis present

## 2018-11-06 DIAGNOSIS — T8781 Dehiscence of amputation stump: Secondary | ICD-10-CM

## 2018-11-06 DIAGNOSIS — E039 Hypothyroidism, unspecified: Secondary | ICD-10-CM | POA: Diagnosis present

## 2018-11-06 DIAGNOSIS — E11649 Type 2 diabetes mellitus with hypoglycemia without coma: Secondary | ICD-10-CM | POA: Diagnosis not present

## 2018-11-06 DIAGNOSIS — Z79890 Hormone replacement therapy (postmenopausal): Secondary | ICD-10-CM

## 2018-11-06 DIAGNOSIS — Z87891 Personal history of nicotine dependence: Secondary | ICD-10-CM

## 2018-11-06 DIAGNOSIS — Z79899 Other long term (current) drug therapy: Secondary | ICD-10-CM

## 2018-11-06 DIAGNOSIS — Z7982 Long term (current) use of aspirin: Secondary | ICD-10-CM

## 2018-11-06 DIAGNOSIS — S88111A Complete traumatic amputation at level between knee and ankle, right lower leg, initial encounter: Secondary | ICD-10-CM | POA: Diagnosis present

## 2018-11-06 HISTORY — DX: Family history of other specified conditions: Z84.89

## 2018-11-06 LAB — CBC WITH DIFFERENTIAL/PLATELET
Abs Immature Granulocytes: 0.08 10*3/uL — ABNORMAL HIGH (ref 0.00–0.07)
Basophils Absolute: 0 10*3/uL (ref 0.0–0.1)
Basophils Relative: 0 %
Eosinophils Absolute: 0 10*3/uL (ref 0.0–0.5)
Eosinophils Relative: 0 %
HCT: 19.1 % — ABNORMAL LOW (ref 39.0–52.0)
Hemoglobin: 6 g/dL — CL (ref 13.0–17.0)
Immature Granulocytes: 1 %
Lymphocytes Relative: 14 %
Lymphs Abs: 2.1 10*3/uL (ref 0.7–4.0)
MCH: 26.9 pg (ref 26.0–34.0)
MCHC: 31.4 g/dL (ref 30.0–36.0)
MCV: 85.7 fL (ref 80.0–100.0)
Monocytes Absolute: 1 10*3/uL (ref 0.1–1.0)
Monocytes Relative: 6 %
Neutro Abs: 12 10*3/uL — ABNORMAL HIGH (ref 1.7–7.7)
Neutrophils Relative %: 79 %
Platelets: 467 10*3/uL — ABNORMAL HIGH (ref 150–400)
RBC: 2.23 MIL/uL — ABNORMAL LOW (ref 4.22–5.81)
RDW: 16.2 % — ABNORMAL HIGH (ref 11.5–15.5)
WBC: 15.2 10*3/uL — ABNORMAL HIGH (ref 4.0–10.5)
nRBC: 0 % (ref 0.0–0.2)

## 2018-11-06 LAB — GLUCOSE, CAPILLARY
Glucose-Capillary: 404 mg/dL — ABNORMAL HIGH (ref 70–99)
Glucose-Capillary: 379 mg/dL — ABNORMAL HIGH (ref 70–99)
Glucose-Capillary: 276 mg/dL — ABNORMAL HIGH (ref 70–99)

## 2018-11-06 LAB — BASIC METABOLIC PANEL
Anion gap: 8 (ref 5–15)
BUN: 32 mg/dL — ABNORMAL HIGH (ref 6–20)
CO2: 25 mmol/L (ref 22–32)
Calcium: 8.2 mg/dL — ABNORMAL LOW (ref 8.9–10.3)
Chloride: 102 mmol/L (ref 98–111)
Creatinine, Ser: 1.65 mg/dL — ABNORMAL HIGH (ref 0.61–1.24)
GFR calc Af Amer: 53 mL/min — ABNORMAL LOW (ref >60)
GFR calc non Af Amer: 45 mL/min — ABNORMAL LOW (ref >60)
Glucose, Bld: 354 mg/dL — ABNORMAL HIGH (ref 70–99)
Potassium: 4.3 mmol/L (ref 3.5–5.1)
Sodium: 135 mmol/L (ref 135–145)

## 2018-11-06 LAB — PROTIME-INR
INR: 1.5 — ABNORMAL HIGH (ref 0.8–1.2)
Prothrombin Time: 17.7 seconds — ABNORMAL HIGH (ref 11.4–15.2)

## 2018-11-06 LAB — SURGICAL PCR SCREEN
MRSA, PCR: NEGATIVE
Staphylococcus aureus: NEGATIVE

## 2018-11-06 LAB — PREPARE RBC (CROSSMATCH)

## 2018-11-06 MED ORDER — FERROUS SULFATE 325 (65 FE) MG PO TABS
325.0000 mg | ORAL_TABLET | Freq: Three times a day (TID) | ORAL | Status: DC
  Administered 2018-11-06 – 2018-11-12 (×17): 325 mg via ORAL
  Filled 2018-11-06 (×17): qty 1

## 2018-11-06 MED ORDER — ADULT MULTIVITAMIN W/MINERALS CH
1.0000 | ORAL_TABLET | Freq: Every day | ORAL | Status: DC
  Administered 2018-11-07 – 2018-11-12 (×6): 1 via ORAL
  Filled 2018-11-06 (×6): qty 1

## 2018-11-06 MED ORDER — FENOFIBRATE 160 MG PO TABS
160.0000 mg | ORAL_TABLET | Freq: Every day | ORAL | Status: DC
  Administered 2018-11-07 – 2018-11-12 (×6): 160 mg via ORAL
  Filled 2018-11-06 (×6): qty 1

## 2018-11-06 MED ORDER — TAMSULOSIN HCL 0.4 MG PO CAPS
0.4000 mg | ORAL_CAPSULE | Freq: Every day | ORAL | Status: DC
  Administered 2018-11-07 – 2018-11-12 (×6): 0.4 mg via ORAL
  Filled 2018-11-06 (×6): qty 1

## 2018-11-06 MED ORDER — METOPROLOL TARTRATE 12.5 MG HALF TABLET
12.5000 mg | ORAL_TABLET | Freq: Two times a day (BID) | ORAL | Status: DC
  Administered 2018-11-06 – 2018-11-11 (×11): 12.5 mg via ORAL
  Filled 2018-11-06 (×11): qty 1

## 2018-11-06 MED ORDER — INSULIN GLARGINE 100 UNIT/ML ~~LOC~~ SOLN
50.0000 [IU] | Freq: Every day | SUBCUTANEOUS | Status: DC
  Administered 2018-11-06: 50 [IU] via SUBCUTANEOUS
  Filled 2018-11-06 (×3): qty 0.5

## 2018-11-06 MED ORDER — ONDANSETRON HCL 4 MG PO TABS: 4.0000 mg | ORAL_TABLET | Freq: Four times a day (QID) | ORAL | Status: DC | PRN

## 2018-11-06 MED ORDER — FLUOXETINE HCL 20 MG PO CAPS
20.0000 mg | ORAL_CAPSULE | Freq: Every day | ORAL | Status: DC
  Administered 2018-11-07 – 2018-11-11 (×5): 20 mg via ORAL
  Filled 2018-11-06 (×5): qty 1

## 2018-11-06 MED ORDER — PROMOD PO LIQD: 30.0000 mL | Freq: Three times a day (TID) | ORAL | Status: DC

## 2018-11-06 MED ORDER — INSULIN GLARGINE 100 UNIT/ML SOLOSTAR PEN: 50.0000 [IU] | PEN_INJECTOR | Freq: Every day | SUBCUTANEOUS | Status: DC

## 2018-11-06 MED ORDER — ZINC SULFATE 220 (50 ZN) MG PO CAPS
220.0000 mg | ORAL_CAPSULE | Freq: Every day | ORAL | Status: DC
  Administered 2018-11-07 – 2018-11-12 (×6): 220 mg via ORAL
  Filled 2018-11-06 (×6): qty 1

## 2018-11-06 MED ORDER — VITAMIN C 500 MG PO TABS
500.0000 mg | ORAL_TABLET | Freq: Two times a day (BID) | ORAL | Status: DC
  Administered 2018-11-06 – 2018-11-12 (×12): 500 mg via ORAL
  Filled 2018-11-06 (×12): qty 1

## 2018-11-06 MED ORDER — GABAPENTIN 300 MG PO CAPS
300.0000 mg | ORAL_CAPSULE | Freq: Two times a day (BID) | ORAL | Status: DC
  Administered 2018-11-06 – 2018-11-12 (×12): 300 mg via ORAL
  Filled 2018-11-06 (×12): qty 1

## 2018-11-06 MED ORDER — TIZANIDINE HCL 4 MG PO TABS
4.0000 mg | ORAL_TABLET | Freq: Two times a day (BID) | ORAL | Status: DC
  Administered 2018-11-06 – 2018-11-12 (×12): 4 mg via ORAL
  Filled 2018-11-06 (×12): qty 1

## 2018-11-06 MED ORDER — POLYETHYLENE GLYCOL 3350 17 G PO PACK
17.0000 g | PACK | Freq: Every day | ORAL | Status: DC | PRN
  Administered 2018-11-10: 17 g via ORAL
  Filled 2018-11-06: qty 1

## 2018-11-06 MED ORDER — LIRAGLUTIDE 18 MG/3ML ~~LOC~~ SOPN: 1.8000 mg | PEN_INJECTOR | Freq: Every day | SUBCUTANEOUS | Status: DC

## 2018-11-06 MED ORDER — INSULIN ASPART 100 UNIT/ML ~~LOC~~ SOLN
0.0000 [IU] | Freq: Every day | SUBCUTANEOUS | Status: DC
  Administered 2018-11-06: 3 [IU] via SUBCUTANEOUS
  Administered 2018-11-07: 2 [IU] via SUBCUTANEOUS
  Administered 2018-11-09: 5 [IU] via SUBCUTANEOUS

## 2018-11-06 MED ORDER — MECLIZINE HCL 25 MG PO TABS: 25.0000 mg | ORAL_TABLET | Freq: Three times a day (TID) | ORAL | Status: DC | PRN

## 2018-11-06 MED ORDER — FUROSEMIDE 40 MG PO TABS
40.0000 mg | ORAL_TABLET | Freq: Every day | ORAL | Status: DC
  Administered 2018-11-07 – 2018-11-12 (×6): 40 mg via ORAL
  Filled 2018-11-06 (×6): qty 1

## 2018-11-06 MED ORDER — SODIUM CHLORIDE 0.9 % IV SOLN
INTRAVENOUS | Status: DC
  Administered 2018-11-06 – 2018-11-09 (×6): via INTRAVENOUS

## 2018-11-06 MED ORDER — ENSURE MAX PROTEIN PO LIQD
11.0000 [oz_av] | Freq: Three times a day (TID) | ORAL | Status: DC
  Administered 2018-11-06 – 2018-11-12 (×16): 11 [oz_av] via ORAL
  Filled 2018-11-06 (×20): qty 330

## 2018-11-06 MED ORDER — INSULIN ASPART 100 UNIT/ML FLEXPEN: 8.0000 [IU] | PEN_INJECTOR | Freq: Three times a day (TID) | SUBCUTANEOUS | Status: DC

## 2018-11-06 MED ORDER — ONDANSETRON HCL 4 MG/2ML IJ SOLN: 4.0000 mg | Freq: Four times a day (QID) | INTRAMUSCULAR | Status: DC | PRN

## 2018-11-06 MED ORDER — SODIUM CHLORIDE 0.9% IV SOLUTION: Freq: Once | INTRAVENOUS | Status: DC

## 2018-11-06 MED ORDER — INSULIN ASPART 100 UNIT/ML ~~LOC~~ SOLN
0.0000 [IU] | Freq: Three times a day (TID) | SUBCUTANEOUS | Status: DC
  Administered 2018-11-06: 9 [IU] via SUBCUTANEOUS
  Administered 2018-11-07 (×3): 5 [IU] via SUBCUTANEOUS
  Administered 2018-11-08: 3 [IU] via SUBCUTANEOUS
  Administered 2018-11-08: 18:00:00 2 [IU] via SUBCUTANEOUS
  Administered 2018-11-08: 3 [IU] via SUBCUTANEOUS
  Administered 2018-11-09: 17:00:00 5 [IU] via SUBCUTANEOUS
  Administered 2018-11-10: 3 [IU] via SUBCUTANEOUS

## 2018-11-06 MED ORDER — HYDROCODONE-ACETAMINOPHEN 5-325 MG PO TABS
2.0000 | ORAL_TABLET | Freq: Four times a day (QID) | ORAL | Status: DC | PRN
  Administered 2018-11-06 – 2018-11-09 (×8): 2 via ORAL
  Filled 2018-11-06 (×8): qty 2

## 2018-11-06 MED ORDER — ASPIRIN EC 81 MG PO TBEC
81.0000 mg | DELAYED_RELEASE_TABLET | Freq: Every day | ORAL | Status: DC
  Administered 2018-11-07 – 2018-11-09 (×3): 81 mg via ORAL
  Filled 2018-11-06 (×3): qty 1

## 2018-11-06 MED ORDER — ATORVASTATIN CALCIUM 80 MG PO TABS
80.0000 mg | ORAL_TABLET | Freq: Every day | ORAL | Status: DC
  Administered 2018-11-06 – 2018-11-11 (×6): 80 mg via ORAL
  Filled 2018-11-06 (×6): qty 1

## 2018-11-06 MED ORDER — MUPIROCIN 2 % EX OINT: 1.0000 "application " | TOPICAL_OINTMENT | Freq: Two times a day (BID) | CUTANEOUS | Status: DC

## 2018-11-06 MED ORDER — DULOXETINE HCL 60 MG PO CPEP
60.0000 mg | ORAL_CAPSULE | Freq: Every day | ORAL | Status: DC
  Administered 2018-11-07 – 2018-11-12 (×6): 60 mg via ORAL
  Filled 2018-11-06 (×6): qty 1

## 2018-11-06 MED ORDER — LEVOTHYROXINE SODIUM 100 MCG PO TABS
100.0000 ug | ORAL_TABLET | Freq: Every day | ORAL | Status: DC
  Administered 2018-11-07 – 2018-11-12 (×6): 100 ug via ORAL
  Filled 2018-11-06 (×6): qty 1

## 2018-11-06 MED ORDER — INSULIN ASPART 100 UNIT/ML ~~LOC~~ SOLN
8.0000 [IU] | Freq: Three times a day (TID) | SUBCUTANEOUS | Status: DC
  Administered 2018-11-06 – 2018-11-12 (×14): 8 [IU] via SUBCUTANEOUS

## 2018-11-06 MED ORDER — ACETAMINOPHEN 325 MG PO TABS: 650.0000 mg | ORAL_TABLET | ORAL | Status: DC | PRN

## 2018-11-06 MED ORDER — MIDODRINE HCL 5 MG PO TABS
5.0000 mg | ORAL_TABLET | Freq: Three times a day (TID) | ORAL | Status: DC
  Filled 2018-11-06 (×4): qty 1

## 2018-11-06 NOTE — H&P (View-Only) (Signed)
ORTHOPAEDIC CONSULTATION  REQUESTING PHYSICIAN: Elwyn Reach, MD  Chief Complaint: Swelling pain and drainage right above-the-knee amputation  HPI: Ethan Campbell is a 58 y.o. male who presents with swelling ischemic changes to the surgical incision with acute bloody drainage from his right above-the-knee amputation.  Patient is approximately 2 weeks out from right above-the-knee amputation.  Patient has been in skilled nursing.  His INR has been checked and has been within therapeutic range however patient's most recent INR drawn was 6.6.  Past Medical History:  Diagnosis Date  . Arm vein blood clot, left    on Coumadin  . Arthritis   . Cancer ([place])    Bone cancer 1987 (in knee Large cell tumor)  . Chronic kidney disease    stage 3  . Depression   . Essential hypertension   . History of stroke   . Hyperlipidemia   . Hypothyroidism   . Insomnia   . Obesity   . Precordial pain June 2011   Nuclear stress; no ischemia; EF 60%  . Presence of permanent cardiac pacemaker   . Proteinuria   . Seizure disorder ([place])   . Sick sinus syndrome ([place])   . Sleep apnea    Dr. Brandon Melnick, uses bipap  . Syncope   . Type 2 diabetes mellitus ([place])   . Venous insufficiency    Past Surgical History:  Procedure Laterality Date  . AMPUTATION Right 06/20/2018   Procedure: RIGHT GREAT TOE AMPUTATION;  Surgeon: Newt Minion, MD;  Location: [facility];  Service: Orthopedics;  Laterality: Right;  . AMPUTATION Right 09/26/2018   Procedure: RIGHT FOOT 5TH RAY AMPUTATION;  Surgeon: Newt Minion, MD;  Location: [facility];  Service: Orthopedics;  Laterality: Right;  MAC and regional anesthesia  . AMPUTATION Right 10/05/2018   Procedure: RIGHT BELOW KNEE AMPUTATION;  Surgeon: Newt Minion, MD;  Location: [facility];  Service: Orthopedics;  Laterality: Right;  . AMPUTATION Right 10/26/2018   Procedure: RIGHT ABOVE KNEE AMPUTATION;  Surgeon: Newt Minion, MD;  Location: [facility];  Service: Orthopedics;   Laterality: Right;  . COLONOSCOPY    . RESECTION BONE TUMOR FEMUR  1980's   Left femur, [facility] at [facility] with bone graft  . TOTAL KNEE ARTHROPLASTY Left 2013   Social History   Socioeconomic History  . Marital status: Single    Spouse name: Not on file  . Number of children: 0  . Years of education: GED  . Highest education level: Not on file  Occupational History  . Occupation: Disabled  Social Needs  . Financial resource strain: Not on file  . Food insecurity:    Worry: Not on file    Inability: Not on file  . Transportation needs:    Medical: Not on file    Non-medical: Not on file  Tobacco Use  . Smoking status: Former Smoker    Types: Cigarettes  . Smokeless tobacco: Never Used  . Tobacco comment: 11/27/14 "quit smoking years ago"  Substance and Sexual Activity  . Alcohol use: No    Alcohol/week: 0.0 standard drinks  . Drug use: No  . Sexual activity: Never  Lifestyle  . Physical activity:    Days per week: Not on file    Minutes per session: Not on file  . Stress: Not on file  Relationships  . Social connections:    Talks on phone: Not on file    Gets together: Not on file    Attends  religious service: Not on file    Active member of club or organization: Not on file    Attends meetings of clubs or organizations: Not on file    Relationship status: Not on file  Other Topics Concern  . Not on file  Social History Narrative  . Not on file   Family History  Problem Relation Age of Onset  . Heart attack Mother 63  . Breast cancer Mother   . Stroke Mother   . Heart attack Father 2  . Breast cancer Sister   . Colon cancer Sister    - negative except otherwise stated in the family history section No Known Allergies Prior to Admission medications   Medication Sig Start Date End Date Taking? Authorizing Provider  acetaminophen (TYLENOL) 325 MG tablet Take 650 mg by mouth every 4 (four) hours as needed for mild pain.    Yes [provider]  aspirin  EC 81 MG tablet Take 81 mg by mouth daily.    Yes [provider]  atorvastatin (LIPITOR) 80 MG tablet Take 1 tablet (80 mg total) by mouth daily. 10/31/18  Yes Ghimire, Henreitta Leber, MD  DULoxetine (CYMBALTA) 60 MG capsule Take 1 capsule (60 mg total) by mouth daily. 10/31/18  Yes Ghimire, Henreitta Leber, MD  fenofibrate (TRICOR) 145 MG tablet Take 1 tablet (145 mg total) by mouth daily. 10/31/18  Yes Ghimire, Henreitta Leber, MD  ferrous sulfate 325 (65 FE) MG tablet Take 1 tablet (325 mg total) by mouth 3 (three) times daily with meals for 30 days. 10/31/18 11/30/18 Yes Ghimire, Henreitta Leber, MD  FLUoxetine (PROZAC) 20 MG capsule Take 1 capsule (20 mg total) by mouth daily. 10/31/18  Yes Ghimire, Henreitta Leber, MD  furosemide (LASIX) 40 MG tablet Take 1 tablet (40 mg total) by mouth daily for 30 days. 10/31/18 11/30/18 Yes Ghimire, Henreitta Leber, MD  gabapentin (NEURONTIN) 300 MG capsule Take 1 capsule (300 mg total) by mouth 2 (two) times daily. 10/31/18  Yes Ghimire, Henreitta Leber, MD  HYDROcodone-acetaminophen (NORCO/VICODIN) 5-325 MG tablet Take 2 tablets by mouth every 6 (six) hours as needed for moderate pain.   Yes [provider]  insulin aspart (NOVOLOG FLEXPEN) 100 UNIT/ML FlexPen Inject 8 Units into the skin 3 (three) times daily with meals for 30 days. 10/31/18 11/30/18 Yes Ghimire, Henreitta Leber, MD  Insulin Glargine (LANTUS) 100 UNIT/ML Solostar Pen Inject 50 Units into the skin daily at 10 pm for 30 days. 10/31/18 11/30/18 Yes Ghimire, Henreitta Leber, MD  levothyroxine (SYNTHROID, LEVOTHROID) 100 MCG tablet Take 1 tablet (100 mcg total) by mouth daily. Take 1 tablet daily before breakfast 10/31/18  Yes Ghimire, Henreitta Leber, MD  liraglutide (VICTOZA) 18 MG/3ML SOPN Inject 0.3 mLs (1.8 mg total) into the skin daily for 30 days. 10/31/18 11/30/18 Yes Ghimire, Henreitta Leber, MD  meclizine (ANTIVERT) 25 MG tablet Take 1 tablet (25 mg total) by mouth every 8 (eight) hours as needed for dizziness. 10/31/18  Yes Ghimire, Henreitta Leber, MD  metoprolol  tartrate (LOPRESSOR) 25 MG tablet Take 0.5 tablets (12.5 mg total) by mouth 2 (two) times daily for 30 days. 10/31/18 11/30/18 Yes Ghimire, Henreitta Leber, MD  midodrine (PROAMATINE) 5 MG tablet Take 1 tablet (5 mg total) by mouth 3 (three) times daily for 30 days. 10/31/18 11/30/18 Yes Ghimire, Henreitta Leber, MD  Multiple Vitamin (MULTIVITAMIN WITH MINERALS) TABS tablet Take 1 tablet by mouth daily for 30 days. 10/31/18 11/30/18 Yes Ghimire, Henreitta Leber, MD  Nutritional Supplements (  PROMOD) LIQD Take 30 mLs by mouth 3 (three) times daily for 30 days. 10/31/18 11/30/18 Yes Ghimire, Henreitta Leber, MD  polyethylene glycol (MIRALAX / GLYCOLAX) packet Take 17 g by mouth daily as needed for mild constipation. 10/31/18  Yes Ghimire, Henreitta Leber, MD  tamsulosin (FLOMAX) 0.4 MG CAPS capsule Take 1 capsule (0.4 mg total) by mouth daily for 30 days. 10/31/18 11/30/18 Yes Ghimire, Henreitta Leber, MD  tiZANidine (ZANAFLEX) 4 MG tablet Take 1 tablet (4 mg total) by mouth 2 (two) times daily for 30 days. 10/31/18 11/30/18 Yes Ghimire, Henreitta Leber, MD  vitamin C (ASCORBIC ACID) 500 MG tablet Take 1 tablet (500 mg total) by mouth 2 (two) times daily for 30 days. 10/31/18 11/30/18 Yes Ghimire, Henreitta Leber, MD  warfarin (COUMADIN) 5 MG tablet Take 1.5 tablets (7.5 mg total) by mouth one time only at 6 PM for 30 doses. Take one tablet (7.5 mg) by  at 6 pm Patient taking differently: Take 7.5 mg by mouth one time only at 6 PM.  10/31/18 11/30/18 Yes Ghimire, Henreitta Leber, MD  zinc sulfate 220 (50 Zn) MG capsule Take 1 capsule (220 mg total) by mouth daily for 30 days. 10/31/18 11/30/18 Yes Ghimire, Henreitta Leber, MD  blood glucose meter kit and supplies Dispense based on patient and insurance preference. Use up to four times daily as directed. (FOR ICD-10 E10.9, E11.9). 10/31/18   Ghimire, Henreitta Leber, MD  doxycycline (VIBRA-TABS) 100 MG tablet Take 1 tablet (100 mg total) by mouth 2 (two) times daily for 26 days. Patient not taking: Reported on 11/06/2018 10/31/18 11/26/18  Jonetta Osgood, MD   glucose blood (ACCU-CHEK AVIVA PLUS) test strip TEST UP TO 4 TIMES DAILY Dx code E11.65 02/28/18   Elayne Snare, MD  glucose blood (FREESTYLE LITE) test strip Check sugar 3 times daily 06/18/18   Elayne Snare, MD  glucose blood (FREESTYLE TEST STRIPS) test strip Use as instructed 10/31/18   Jonetta Osgood, MD  Insulin Pen Needle 32G X 8 MM MISC Use as directed 10/31/18   Jonetta Osgood, MD  Lancets (FREESTYLE) lancets Use as instructed 10/31/18   Ghimire, Henreitta Leber, MD  oxyCODONE (OXY IR/ROXICODONE) 5 MG immediate release tablet Take 1 tablet (5 mg total) by mouth every 6 (six) hours as needed for severe pain (pain score 7-10). Patient not taking: Reported on 11/06/2018 10/31/18   Jonetta Osgood, MD   No results found. - pertinent xrays, CT, MRI studies were reviewed and independently interpreted  Positive ROS: All other systems have been reviewed and were otherwise negative with the exception of those mentioned in the HPI and as above.  Physical Exam: General: Alert, no acute distress Psychiatric: Patient is competent for consent with normal mood and affect Lymphatic: No axillary or cervical lymphadenopathy Cardiovascular: No pedal edema Respiratory: No cyanosis, no use of accessory musculature GI: No organomegaly, abdomen is soft and non-tender    Images:  _0 @  Labs:  Lab Results  Component Value Date   HGBA1C 6.9 (A) 06/15/2018   HGBA1C 10.9 (A) 02/21/2018   HGBA1C 13.4 09/11/2017   ESRSEDRATE 130 (H) 10/22/2018   CRP 15.1 (H) 10/22/2018   REPTSTATUS 10/27/2018 FINAL 10/22/2018   CULT  10/22/2018    NO GROWTH 5 DAYS Performed at [facility], [place] [address] [place], [place] [address]     Lab Results  Component Value Date   ALBUMIN 1.5 (L) 10/29/2018   ALBUMIN 2.4 (L) 10/22/2018   ALBUMIN  3.9 09/30/2015    Neurologic: Patient does not have protective sensation bilateral lower extremities.   MUSCULOSKELETAL:   Skin: Examination patient has tense  swelling of the skin of the right lower extremity patient states that this is acute.  There is some mild ischemic stages along the surgical incision secondary to swelling there is bloody drainage from the incision.  Patient's most recent INR was 6.6.  Patient's most recent albumin is 1.5.  Patient's hemoglobin A1c has dropped from 10.9 down to 6.9 in November of last year no current hemoglobin A1c's.  Patient denies any fever or chills.  Assessment: Assessment: Diabetic insensate neuropathy with peripheral vascular disease with severe protein caloric malnutrition with acute bleeding into the right above-the-knee amputation secondary to his INR being 6.6.  Plan: Plan: Patient has received FFP and vitamin K.  We will plan for revision of the above-knee amputation on Friday.  Risks and benefits were discussed patient states he understands wished to proceed at this time.  I have spoken to his daughter Ethan Campbell today and explained the proposed surgery.  Thank you for the consult and the opportunity to see Ethan Campbell, [place] [phone] 1:16 PM

## 2018-11-06 NOTE — Progress Notes (Signed)
CRITICAL VALUE ALERT  Critical Value:  Hgb 6.0  Date & Time Notied:  11/06/2018 @1930   Provider Notified: Lamar Blinks of [facility]  Orders Received/Actions taken: 1 Unit PRBCs ordered. Will transfuse and continue to monitor.

## 2018-11-06 NOTE — Telephone Encounter (Signed)
I called and sw pt's daughter to advise that the hospital had already called to inform Dr. Sharol Given and that he is aware the pt is on his way to [facility]. Advised that he is seeing pt's in the office right now but would be over to him him and that I would give the contact number to Dr. Sharol Given to call after he has assessed pt. Voiced understanding and was pleased with plan.

## 2018-11-06 NOTE — Consult Note (Signed)
ORTHOPAEDIC CONSULTATION  REQUESTING PHYSICIAN: Elwyn Reach, MD  Chief Complaint: Swelling pain and drainage right above-the-knee amputation  HPI: Ethan Campbell is a 58 y.o. male who presents with swelling ischemic changes to the surgical incision with acute bloody drainage from his right above-the-knee amputation.  Patient is approximately 2 weeks out from right above-the-knee amputation.  Patient has been in skilled nursing.  His INR has been checked and has been within therapeutic range however patient's most recent INR drawn was 6.6.  Past Medical History:  Diagnosis Date  . Arm vein blood clot, left    on Coumadin  . Arthritis   . Cancer ([place])    Bone cancer 1987 (in knee Large cell tumor)  . Chronic kidney disease    stage 3  . Depression   . Essential hypertension   . History of stroke   . Hyperlipidemia   . Hypothyroidism   . Insomnia   . Obesity   . Precordial pain June 2011   Nuclear stress; no ischemia; EF 60%  . Presence of permanent cardiac pacemaker   . Proteinuria   . Seizure disorder ([place])   . Sick sinus syndrome ([place])   . Sleep apnea    Dr. Brandon Melnick, uses bipap  . Syncope   . Type 2 diabetes mellitus ([place])   . Venous insufficiency    Past Surgical History:  Procedure Laterality Date  . AMPUTATION Right 06/20/2018   Procedure: RIGHT GREAT TOE AMPUTATION;  Surgeon: Newt Minion, MD;  Location: [facility];  Service: Orthopedics;  Laterality: Right;  . AMPUTATION Right 09/26/2018   Procedure: RIGHT FOOT 5TH RAY AMPUTATION;  Surgeon: Newt Minion, MD;  Location: [facility];  Service: Orthopedics;  Laterality: Right;  MAC and regional anesthesia  . AMPUTATION Right 10/05/2018   Procedure: RIGHT BELOW KNEE AMPUTATION;  Surgeon: Newt Minion, MD;  Location: [facility];  Service: Orthopedics;  Laterality: Right;  . AMPUTATION Right 10/26/2018   Procedure: RIGHT ABOVE KNEE AMPUTATION;  Surgeon: Newt Minion, MD;  Location: [facility];  Service: Orthopedics;   Laterality: Right;  . COLONOSCOPY    . RESECTION BONE TUMOR FEMUR  1980's   Left femur, treated at [facility] with bone graft  . TOTAL KNEE ARTHROPLASTY Left 2013   Social History   Socioeconomic History  . Marital status: Single    Spouse name: Not on file  . Number of children: 0  . Years of education: GED  . Highest education level: Not on file  Occupational History  . Occupation: Disabled  Social Needs  . Financial resource strain: Not on file  . Food insecurity:    Worry: Not on file    Inability: Not on file  . Transportation needs:    Medical: Not on file    Non-medical: Not on file  Tobacco Use  . Smoking status: Former Smoker    Types: Cigarettes  . Smokeless tobacco: Never Used  . Tobacco comment: 11/27/14 "quit smoking years ago"  Substance and Sexual Activity  . Alcohol use: No    Alcohol/week: 0.0 standard drinks  . Drug use: No  . Sexual activity: Never  Lifestyle  . Physical activity:    Days per week: Not on file    Minutes per session: Not on file  . Stress: Not on file  Relationships  . Social connections:    Talks on phone: Not on file    Gets together: Not on file    Attends  religious service: Not on file    Active member of club or organization: Not on file    Attends meetings of clubs or organizations: Not on file    Relationship status: Not on file  Other Topics Concern  . Not on file  Social History Narrative  . Not on file   Family History  Problem Relation Age of Onset  . Heart attack Mother 81  . Breast cancer Mother   . Stroke Mother   . Heart attack Father 90  . Breast cancer Sister   . Colon cancer Sister    - negative except otherwise stated in the family history section No Known Allergies Prior to Admission medications   Medication Sig Start Date End Date Taking? Authorizing Provider  acetaminophen (TYLENOL) 325 MG tablet Take 650 mg by mouth every 4 (four) hours as needed for mild pain.    Yes [provider]  aspirin  EC 81 MG tablet Take 81 mg by mouth daily.    Yes [provider]  atorvastatin (LIPITOR) 80 MG tablet Take 1 tablet (80 mg total) by mouth daily. 10/31/18  Yes Ghimire, Henreitta Leber, MD  DULoxetine (CYMBALTA) 60 MG capsule Take 1 capsule (60 mg total) by mouth daily. 10/31/18  Yes Ghimire, Henreitta Leber, MD  fenofibrate (TRICOR) 145 MG tablet Take 1 tablet (145 mg total) by mouth daily. 10/31/18  Yes Ghimire, Henreitta Leber, MD  ferrous sulfate 325 (65 FE) MG tablet Take 1 tablet (325 mg total) by mouth 3 (three) times daily with meals for 30 days. 10/31/18 11/30/18 Yes Ghimire, Henreitta Leber, MD  FLUoxetine (PROZAC) 20 MG capsule Take 1 capsule (20 mg total) by mouth daily. 10/31/18  Yes Ghimire, Henreitta Leber, MD  furosemide (LASIX) 40 MG tablet Take 1 tablet (40 mg total) by mouth daily for 30 days. 10/31/18 11/30/18 Yes Ghimire, Henreitta Leber, MD  gabapentin (NEURONTIN) 300 MG capsule Take 1 capsule (300 mg total) by mouth 2 (two) times daily. 10/31/18  Yes Ghimire, Henreitta Leber, MD  HYDROcodone-acetaminophen (NORCO/VICODIN) 5-325 MG tablet Take 2 tablets by mouth every 6 (six) hours as needed for moderate pain.   Yes [provider]  insulin aspart (NOVOLOG FLEXPEN) 100 UNIT/ML FlexPen Inject 8 Units into the skin 3 (three) times daily with meals for 30 days. 10/31/18 11/30/18 Yes Ghimire, Henreitta Leber, MD  Insulin Glargine (LANTUS) 100 UNIT/ML Solostar Pen Inject 50 Units into the skin daily at 10 pm for 30 days. 10/31/18 11/30/18 Yes Ghimire, Henreitta Leber, MD  levothyroxine (SYNTHROID, LEVOTHROID) 100 MCG tablet Take 1 tablet (100 mcg total) by mouth daily. Take 1 tablet daily before breakfast 10/31/18  Yes Ghimire, Henreitta Leber, MD  liraglutide (VICTOZA) 18 MG/3ML SOPN Inject 0.3 mLs (1.8 mg total) into the skin daily for 30 days. 10/31/18 11/30/18 Yes Ghimire, Henreitta Leber, MD  meclizine (ANTIVERT) 25 MG tablet Take 1 tablet (25 mg total) by mouth every 8 (eight) hours as needed for dizziness. 10/31/18  Yes Ghimire, Henreitta Leber, MD  metoprolol  tartrate (LOPRESSOR) 25 MG tablet Take 0.5 tablets (12.5 mg total) by mouth 2 (two) times daily for 30 days. 10/31/18 11/30/18 Yes Ghimire, Henreitta Leber, MD  midodrine (PROAMATINE) 5 MG tablet Take 1 tablet (5 mg total) by mouth 3 (three) times daily for 30 days. 10/31/18 11/30/18 Yes Ghimire, Henreitta Leber, MD  Multiple Vitamin (MULTIVITAMIN WITH MINERALS) TABS tablet Take 1 tablet by mouth daily for 30 days. 10/31/18 11/30/18 Yes Ghimire, Henreitta Leber, MD  Nutritional Supplements (  PROMOD) LIQD Take 30 mLs by mouth 3 (three) times daily for 30 days. 10/31/18 11/30/18 Yes Ghimire, Henreitta Leber, MD  polyethylene glycol (MIRALAX / GLYCOLAX) packet Take 17 g by mouth daily as needed for mild constipation. 10/31/18  Yes Ghimire, Henreitta Leber, MD  tamsulosin (FLOMAX) 0.4 MG CAPS capsule Take 1 capsule (0.4 mg total) by mouth daily for 30 days. 10/31/18 11/30/18 Yes Ghimire, Henreitta Leber, MD  tiZANidine (ZANAFLEX) 4 MG tablet Take 1 tablet (4 mg total) by mouth 2 (two) times daily for 30 days. 10/31/18 11/30/18 Yes Ghimire, Henreitta Leber, MD  vitamin C (ASCORBIC ACID) 500 MG tablet Take 1 tablet (500 mg total) by mouth 2 (two) times daily for 30 days. 10/31/18 11/30/18 Yes Ghimire, Henreitta Leber, MD  warfarin (COUMADIN) 5 MG tablet Take 1.5 tablets (7.5 mg total) by mouth one time only at 6 PM for 30 doses. Take one tablet (7.5 mg) by  at 6 pm Patient taking differently: Take 7.5 mg by mouth one time only at 6 PM.  10/31/18 11/30/18 Yes Ghimire, Henreitta Leber, MD  zinc sulfate 220 (50 Zn) MG capsule Take 1 capsule (220 mg total) by mouth daily for 30 days. 10/31/18 11/30/18 Yes Ghimire, Henreitta Leber, MD  blood glucose meter kit and supplies Dispense based on patient and insurance preference. Use up to four times daily as directed. (FOR ICD-10 E10.9, E11.9). 10/31/18   Ghimire, Henreitta Leber, MD  doxycycline (VIBRA-TABS) 100 MG tablet Take 1 tablet (100 mg total) by mouth 2 (two) times daily for 26 days. Patient not taking: Reported on 11/06/2018 10/31/18 11/26/18  Jonetta Osgood, MD   glucose blood (ACCU-CHEK AVIVA PLUS) test strip TEST UP TO 4 TIMES DAILY Dx code E11.65 02/28/18   Elayne Snare, MD  glucose blood (FREESTYLE LITE) test strip Check sugar 3 times daily 06/18/18   Elayne Snare, MD  glucose blood (FREESTYLE TEST STRIPS) test strip Use as instructed 10/31/18   Jonetta Osgood, MD  Insulin Pen Needle 32G X 8 MM MISC Use as directed 10/31/18   Jonetta Osgood, MD  Lancets (FREESTYLE) lancets Use as instructed 10/31/18   Ghimire, Henreitta Leber, MD  oxyCODONE (OXY IR/ROXICODONE) 5 MG immediate release tablet Take 1 tablet (5 mg total) by mouth every 6 (six) hours as needed for severe pain (pain score 7-10). Patient not taking: Reported on 11/06/2018 10/31/18   Jonetta Osgood, MD   No results found. - pertinent xrays, CT, MRI studies were reviewed and independently interpreted  Positive ROS: All other systems have been reviewed and were otherwise negative with the exception of those mentioned in the HPI and as above.  Physical Exam: General: Alert, no acute distress Psychiatric: Patient is competent for consent with normal mood and affect Lymphatic: No axillary or cervical lymphadenopathy Cardiovascular: No pedal edema Respiratory: No cyanosis, no use of accessory musculature GI: No organomegaly, abdomen is soft and non-tender    Images:  _0 @  Labs:  Lab Results  Component Value Date   HGBA1C 6.9 (A) 06/15/2018   HGBA1C 10.9 (A) 02/21/2018   HGBA1C 13.4 09/11/2017   ESRSEDRATE 130 (H) 10/22/2018   CRP 15.1 (H) 10/22/2018   REPTSTATUS 10/27/2018 FINAL 10/22/2018   CULT  10/22/2018    NO GROWTH 5 DAYS Performed at [facility], [place] [address] [place], [place] [address]     Lab Results  Component Value Date   ALBUMIN 1.5 (L) 10/29/2018   ALBUMIN 2.4 (L) 10/22/2018   ALBUMIN  3.9 09/30/2015    Neurologic: Patient does not have protective sensation bilateral lower extremities.   MUSCULOSKELETAL:   Skin: Examination patient has tense  swelling of the skin of the right lower extremity patient states that this is acute.  There is some mild ischemic stages along the surgical incision secondary to swelling there is bloody drainage from the incision.  Patient's most recent INR was 6.6.  Patient's most recent albumin is 1.5.  Patient's hemoglobin A1c has dropped from 10.9 down to 6.9 in November of last year no current hemoglobin A1c's.  Patient denies any fever or chills.  Assessment: Assessment: Diabetic insensate neuropathy with peripheral vascular disease with severe protein caloric malnutrition with acute bleeding into the right above-the-knee amputation secondary to his INR being 6.6.  Plan: Plan: Patient has received FFP and vitamin K.  We will plan for revision of the above-knee amputation on Friday.  Risks and benefits were discussed patient states he understands wished to proceed at this time.  I have spoken to his daughter Donella Stade today and explained the proposed surgery.  Thank you for the consult and the opportunity to see Mr. Ethan Campbell, [place] [phone] 1:16 PM

## 2018-11-06 NOTE — H&P (Signed)
History and Physical   WALTHER SANAGUSTIN KCM:[id number] DOB: October 08, 1960 DOA: 11/06/2018  Referring MD/NP/PA: Benson Norway  PCP: Arsenio Katz, NP   Outpatient Specialists: Dr Sharol Given   Patient coming from: [facility]  Chief Complaint: Bleeding from stump.  HPI: Ethan Campbell is a 58 y.o. male with medical history significant of recent right below-knee amputation about 2 weeks ago, hypertension, hyperlipidemia, depression, seizure disorder, hypothyroidism and chronic peripheral vascular disease who came in secondary to swelling tenderness and drainage from his right AKA stump.  Patient was seen at [facility] rocking him evaluated and found to have significant bleeding from the stump with his hemoglobin dropping to 6.6 from 7.7 on the ninth.  Patient was sent in for review.  He received 1 unit of packed red blood cells in route.  He denies any pain.  No nausea vomiting or diarrhea.  Patient has been evaluated by Dr. Sharol Given who intends to take patient in for surgery on Friday.  He is otherwise hemodynamically stable.  Patient is on warfarin.  No trauma.   Review of Systems: As per HPI otherwise 10 point review of systems negative.    Past Medical History:  Diagnosis Date  . Arm vein blood clot, left    on Coumadin  . Arthritis   . Cancer ([place])    Bone cancer 1987 (in knee Large cell tumor)  . Chronic kidney disease    stage 3  . Depression   . Essential hypertension   . Family history of adverse reaction to anesthesia    " my mother woke up during a cardiac surgery "  . History of stroke   . Hyperlipidemia   . Hypothyroidism   . Insomnia   . Obesity   . Precordial pain June 2011   Nuclear stress; no ischemia; EF 60%  . Presence of permanent cardiac pacemaker   . Proteinuria   . Seizure disorder ([place])   . Sick sinus syndrome ([place])   . Sleep apnea    Dr. Brandon Melnick, uses bipap  . Syncope   . Type 2 diabetes mellitus ([place])   . Venous insufficiency     Past Surgical History:   Procedure Laterality Date  . AMPUTATION Right 06/20/2018   Procedure: RIGHT GREAT TOE AMPUTATION;  Surgeon: Newt Minion, MD;  Location: [place];  Service: Orthopedics;  Laterality: Right;  . AMPUTATION Right 09/26/2018   Procedure: RIGHT FOOT 5TH RAY AMPUTATION;  Surgeon: Newt Minion, MD;  Location: [place];  Service: Orthopedics;  Laterality: Right;  MAC and regional anesthesia  . AMPUTATION Right 10/05/2018   Procedure: RIGHT BELOW KNEE AMPUTATION;  Surgeon: Newt Minion, MD;  Location: [place];  Service: Orthopedics;  Laterality: Right;  . AMPUTATION Right 10/26/2018   Procedure: RIGHT ABOVE KNEE AMPUTATION;  Surgeon: Newt Minion, MD;  Location: [place];  Service: Orthopedics;  Laterality: Right;  . COLONOSCOPY    . RESECTION BONE TUMOR FEMUR  1980's   Left femur, treated at [facility] with bone graft  . TOTAL KNEE ARTHROPLASTY Left 2013     reports that he has quit smoking. His smoking use included cigarettes. He has never used smokeless tobacco. He reports that he does not drink alcohol or use drugs.  No Known Allergies  Family History  Problem Relation Age of Onset  . Heart attack Mother 74  . Breast cancer Mother   . Stroke Mother   . Heart attack Father 22  . Breast cancer Sister   .  Colon cancer Sister      Prior to Admission medications   Medication Sig Start Date End Date Taking? Authorizing Provider  acetaminophen (TYLENOL) 325 MG tablet Take 650 mg by mouth every 4 (four) hours as needed (fever of 101 or higher).    [provider]  aspirin EC 81 MG tablet Take 81 mg by mouth daily.     [provider]  atorvastatin (LIPITOR) 80 MG tablet Take 1 tablet (80 mg total) by mouth daily. 10/31/18   Ghimire, Henreitta Leber, MD  blood glucose meter kit and supplies Dispense based on patient and insurance preference. Use up to four times daily as directed. (FOR ICD-10 E10.9, E11.9). 10/31/18   Ghimire, Henreitta Leber, MD  doxycycline (VIBRA-TABS) 100 MG tablet Take 1 tablet (100  mg total) by mouth 2 (two) times daily for 26 days. 10/31/18 11/26/18  Ghimire, Henreitta Leber, MD  DULoxetine (CYMBALTA) 60 MG capsule Take 1 capsule (60 mg total) by mouth daily. 10/31/18   Ghimire, Henreitta Leber, MD  fenofibrate (TRICOR) 145 MG tablet Take 1 tablet (145 mg total) by mouth daily. 10/31/18   Ghimire, Henreitta Leber, MD  ferrous sulfate 325 (65 FE) MG tablet Take 1 tablet (325 mg total) by mouth 3 (three) times daily with meals for 30 days. 10/31/18 11/30/18  Ghimire, Henreitta Leber, MD  FLUoxetine (PROZAC) 20 MG capsule Take 1 capsule (20 mg total) by mouth daily. 10/31/18   Ghimire, Henreitta Leber, MD  furosemide (LASIX) 40 MG tablet Take 1 tablet (40 mg total) by mouth daily for 30 days. 10/31/18 11/30/18  Ghimire, Henreitta Leber, MD  gabapentin (NEURONTIN) 300 MG capsule Take 1 capsule (300 mg total) by mouth 2 (two) times daily. 10/31/18   Ghimire, Henreitta Leber, MD  glucose blood (ACCU-CHEK AVIVA PLUS) test strip TEST UP TO 4 TIMES DAILY Dx code E11.65 02/28/18   Elayne Snare, MD  glucose blood (FREESTYLE LITE) test strip Check sugar 3 times daily 06/18/18   Elayne Snare, MD  glucose blood (FREESTYLE TEST STRIPS) test strip Use as instructed 10/31/18   Ghimire, Henreitta Leber, MD  insulin aspart (NOVOLOG FLEXPEN) 100 UNIT/ML FlexPen Inject 8 Units into the skin 3 (three) times daily with meals for 30 days. 10/31/18 11/30/18  Ghimire, Henreitta Leber, MD  Insulin Glargine (LANTUS) 100 UNIT/ML Solostar Pen Inject 50 Units into the skin daily at 10 pm for 30 days. 10/31/18 11/30/18  Jonetta Osgood, MD  Insulin Pen Needle 32G X 8 MM MISC Use as directed 10/31/18   Jonetta Osgood, MD  Lancets (FREESTYLE) lancets Use as instructed 10/31/18   Ghimire, Henreitta Leber, MD  levothyroxine (SYNTHROID, LEVOTHROID) 100 MCG tablet Take 1 tablet (100 mcg total) by mouth daily. Take 1 tablet daily before breakfast 10/31/18   Ghimire, Henreitta Leber, MD  liraglutide (VICTOZA) 18 MG/3ML SOPN Inject 0.3 mLs (1.8 mg total) into the skin daily for 30 days. 10/31/18 11/30/18  Ghimire,  Henreitta Leber, MD  meclizine (ANTIVERT) 25 MG tablet Take 1 tablet (25 mg total) by mouth every 8 (eight) hours as needed for dizziness. 10/31/18   Ghimire, Henreitta Leber, MD  metoprolol tartrate (LOPRESSOR) 25 MG tablet Take 0.5 tablets (12.5 mg total) by mouth 2 (two) times daily for 30 days. 10/31/18 11/30/18  Ghimire, Henreitta Leber, MD  midodrine (PROAMATINE) 5 MG tablet Take 1 tablet (5 mg total) by mouth 3 (three) times daily for 30 days. 10/31/18 11/30/18  GhimireHenreitta Leber, MD  Multiple Vitamin (MULTIVITAMIN WITH MINERALS)  TABS tablet Take 1 tablet by mouth daily for 30 days. 10/31/18 11/30/18  Ghimire, Henreitta Leber, MD  Nutritional Supplements (PROMOD) LIQD Take 30 mLs by mouth 3 (three) times daily for 30 days. 10/31/18 11/30/18  Ghimire, Henreitta Leber, MD  oxyCODONE (OXY IR/ROXICODONE) 5 MG immediate release tablet Take 1 tablet (5 mg total) by mouth every 6 (six) hours as needed for severe pain (pain score 7-10). 10/31/18   Ghimire, Henreitta Leber, MD  polyethylene glycol (MIRALAX / GLYCOLAX) packet Take 17 g by mouth daily as needed for mild constipation. 10/31/18   Ghimire, Henreitta Leber, MD  PRESCRIPTION MEDICATION Inhale into the lungs at bedtime. CPAP    [provider]  tamsulosin (FLOMAX) 0.4 MG CAPS capsule Take 1 capsule (0.4 mg total) by mouth daily for 30 days. 10/31/18 11/30/18  Ghimire, Henreitta Leber, MD  tiZANidine (ZANAFLEX) 4 MG tablet Take 1 tablet (4 mg total) by mouth 2 (two) times daily for 30 days. 10/31/18 11/30/18  Ghimire, Henreitta Leber, MD  vitamin C (ASCORBIC ACID) 500 MG tablet Take 1 tablet (500 mg total) by mouth 2 (two) times daily for 30 days. 10/31/18 11/30/18  Ghimire, Henreitta Leber, MD  warfarin (COUMADIN) 5 MG tablet Take 1.5 tablets (7.5 mg total) by mouth one time only at 6 PM for 30 doses. Take one tablet (7.5 mg) by  at 6 pm 10/31/18 11/30/18  Jonetta Osgood, MD  zinc sulfate 220 (50 Zn) MG capsule Take 1 capsule (220 mg total) by mouth daily for 30 days. 10/31/18 11/30/18  Jonetta Osgood, MD    Physical Exam:  Vitals:   11/06/18 1200 11/06/18 1208  BP:  (!) 151/59  Pulse:  74  Resp:  19  Temp:  98 F (36.7 C)  TempSrc:  Oral  SpO2:  99%  Weight: 128.5 kg   Height: _0  (1.753 m)       Constitutional: NAD, calm, comfortable Vitals:   11/06/18 1200 11/06/18 1208  BP:  (!) 151/59  Pulse:  74  Resp:  19  Temp:  98 F (36.7 C)  TempSrc:  Oral  SpO2:  99%  Weight: 128.5 kg   Height: _1  (1.753 m)    Eyes: PERRL, lids and conjunctivae normal ENMT: Mucous membranes are moist. Posterior pharynx clear of any exudate or lesions.Normal dentition.  Neck: normal, supple, no masses, no thyromegaly Respiratory: clear to auscultation bilaterally, no wheezing, no crackles. Normal respiratory effort. No accessory muscle use.  Cardiovascular: Regular rate and rhythm, no murmurs / rubs / gallops. No extremity edema. 2+ pedal pulses. No carotid bruits.  Abdomen: no tenderness, no masses palpated. No hepatosplenomegaly. Bowel sounds positive.  Musculoskeletal: Right AKA stump swollen, tender, mild bloody drainage, currently dressed normal muscle tone.  Skin: no rashes, lesions, ulcers. No induration Neurologic: CN 2-12 grossly intact. Sensation intact, DTR normal. Strength 5/5 in all 4.  Psychiatric: Normal judgment and insight. Alert and oriented x 3. Normal mood.  Normal   Labs on Admission: I have personally reviewed following labs and imaging studies  CBC: Recent Labs  Lab 10/31/18 0355 11/01/18 1230  WBC 15.8* 13.8*  HGB 7.1* 7.7*  HCT 23.1* 25.2*  MCV 87.2 87.2  PLT 528* 509*   Basic Metabolic Panel: Recent Labs  Lab 10/31/18 0355  NA 133*  K 5.0  CL 102  CO2 25  GLUCOSE 186*  BUN 42*  CREATININE 1.44*  CALCIUM 8.3*   GFR: Estimated Creatinine Clearance: 75.1 mL/min (A) (by C-G  formula based on SCr of 1.44 mg/dL (H)). Liver Function Tests: No results for input(s): AST, ALT, ALKPHOS, BILITOT, PROT, ALBUMIN in the last 168 hours. No results for input(s): LIPASE,  AMYLASE in the last 168 hours. No results for input(s): AMMONIA in the last 168 hours. Coagulation Profile: Recent Labs  Lab 10/31/18 0355  INR 1.8*   Cardiac Enzymes: No results for input(s): CKTOTAL, CKMB, CKMBINDEX, TROPONINI in the last 168 hours. BNP (last 3 results) No results for input(s): PROBNP in the last 8760 hours. HbA1C: No results for input(s): HGBA1C in the last 72 hours. CBG: Recent Labs  Lab 10/31/18 0744 10/31/18 1127 10/31/18 1630 11/06/18 1308 11/06/18 1626  GLUCAP 167* 171* 107* 404* 379*   Lipid Profile: No results for input(s): CHOL, HDL, LDLCALC, TRIG, CHOLHDL, LDLDIRECT in the last 72 hours. Thyroid Function Tests: No results for input(s): TSH, T4TOTAL, FREET4, T3FREE, THYROIDAB in the last 72 hours. Anemia Panel: No results for input(s): VITAMINB12, FOLATE, FERRITIN, TIBC, IRON, RETICCTPCT in the last 72 hours. Urine analysis:    Component Value Date/Time   COLORURINE YELLOW 09/03/2015 1400   APPEARANCEUR CLEAR 09/03/2015 1400   LABSPEC 1.020 09/03/2015 1400   PHURINE 5.0 09/03/2015 1400   GLUCOSEU 500 (A) 09/03/2015 1400   GLUCOSEU 500 (A) 11/14/2013 1016   HGBUR NEGATIVE 09/03/2015 1400   BILIRUBINUR NEGATIVE 09/03/2015 1400   KETONESUR NEGATIVE 09/03/2015 1400   PROTEINUR NEGATIVE 09/03/2015 1400   UROBILINOGEN 0.2 03/24/2015 1530   NITRITE NEGATIVE 09/03/2015 1400   LEUKOCYTESUR NEGATIVE 09/03/2015 1400   Sepsis Labs: _0 (procalcitonin:4,lacticidven:4) )No results found for this or any previous visit (from the past 240 hour(s)).   Radiological Exams on Admission: No results found.  E  Assessment/Plan Principal Problem:   Hematoma of amputation stump of right lower extremity (HCC) Active Problems:   Type II diabetes mellitus, uncontrolled ([place])   Morbid obesity (HCC)   OBSTRUCTIVE SLEEP APNEA   Essential hypertension, benign   Acute renal failure superimposed on stage 3 chronic kidney disease (HCC)   Diabetic  autonomic neuropathy associated with type 2 diabetes mellitus (HCC)   Below-knee amputation of right lower extremity (HCC)   Dehiscence of amputation stump (HCC)   Hematoma of amputation stump of left lower extremity (HCC)   Anticoagulants causing adverse effect in therapeutic use     #1 hematoma and bleeding of the right above knee amputation: Secondary to anticoagulation.  Patient's PT/INR was elevated at 6.6 and reversal was initiated..  At this point will admit the patient.  Serial CBCs.  Transfuse 1 more unit of packed red blood cells.  Stabilize patient and hold his warfarin.  Use SCDs for now and follow orthopedic guidelines.  #2 diabetes: Continue home regimen with sliding scale insulin  #3 obstructive sleep apnea: Continue CPAP at night.  #4 hypertension: continue with blood pressure from home.  #5 chronic kidney disease: again continue with home regimen.  #6 morbid obesity: Counseling provided.  #7 warfarin coagulopathy: Patient's INR was 6.6 prior to transfer.  He has received warfarin and FFP.  Continue to monitor.   DVT prophylaxis: SCD Code Status: Full code Family Communication: No family with patient discussed carefully with patient Disposition Plan: To be determined Consults called: Dr. Sharol Given Admission status: Inpatient  Severity of Illness: The appropriate patient status for this patient is INPATIENT. Inpatient status is judged to be reasonable and necessary in order to provide the required intensity of service to ensure the patient's safety. The patient's presenting symptoms, physical exam  findings, and initial radiographic and laboratory data in the context of their chronic comorbidities is felt to place them at high risk for further clinical deterioration. Furthermore, it is not anticipated that the patient will be medically stable for discharge from the hospital within 2 midnights of admission. The following factors support the patient status of inpatient.   "  The patient's presenting symptoms include right stump drainage and bleeding. " The worrisome physical exam findings include swollen stump with some leakage of blood. " The initial radiographic and laboratory data are worrisome because of hemoglobin dropping to 6.6. " The chronic co-morbidities include diabetes with hypertension and peripheral vascular disease.   * I certify that at the point of admission it is my clinical judgment that the patient will require inpatient hospital care spanning beyond 2 midnights from the point of admission due to high intensity of service, high risk for further deterioration and high frequency of surveillance required.Barbette Merino MD [facility] Pager [phone]  If 7PM-7AM, please contact night-coverage [email or web address] Password [facility]  11/06/2018, 6:21 PM

## 2018-11-06 NOTE — Telephone Encounter (Signed)
pts daughter Crystal calling, Wound from Lt inner thigh began bleeding out and was taken to hospital this am. he is currently at [facility] and he will be transported to [facility] to be direct admit. Family ask you come see him. [phone] or [phone]

## 2018-11-07 DIAGNOSIS — S78111A Complete traumatic amputation at level between right hip and knee, initial encounter: Secondary | ICD-10-CM

## 2018-11-07 DIAGNOSIS — G4733 Obstructive sleep apnea (adult) (pediatric): Secondary | ICD-10-CM

## 2018-11-07 DIAGNOSIS — T45515A Adverse effect of anticoagulants, initial encounter: Secondary | ICD-10-CM

## 2018-11-07 DIAGNOSIS — I10 Essential (primary) hypertension: Secondary | ICD-10-CM

## 2018-11-07 LAB — COMPREHENSIVE METABOLIC PANEL
ALT: 23 U/L (ref 0–44)
AST: 17 U/L (ref 15–41)
Albumin: 1.6 g/dL — ABNORMAL LOW (ref 3.5–5.0)
Alkaline Phosphatase: 94 U/L (ref 38–126)
Anion gap: 7 (ref 5–15)
BUN: 34 mg/dL — ABNORMAL HIGH (ref 6–20)
CO2: 25 mmol/L (ref 22–32)
Calcium: 8.2 mg/dL — ABNORMAL LOW (ref 8.9–10.3)
Chloride: 103 mmol/L (ref 98–111)
Creatinine, Ser: 1.42 mg/dL — ABNORMAL HIGH (ref 0.61–1.24)
GFR calc Af Amer: >60 mL/min (ref >60)
GFR calc non Af Amer: 54 mL/min — ABNORMAL LOW (ref >60)
Glucose, Bld: 245 mg/dL — ABNORMAL HIGH (ref 70–99)
Potassium: 4.3 mmol/L (ref 3.5–5.1)
Sodium: 135 mmol/L (ref 135–145)
Total Bilirubin: 1 mg/dL (ref 0.3–1.2)
Total Protein: 5.6 g/dL — ABNORMAL LOW (ref 6.5–8.1)

## 2018-11-07 LAB — CBC
HCT: 20.7 % — ABNORMAL LOW (ref 39.0–52.0)
HCT: 22.1 % — ABNORMAL LOW (ref 39.0–52.0)
Hemoglobin: 6.5 g/dL — CL (ref 13.0–17.0)
Hemoglobin: 7.3 g/dL — ABNORMAL LOW (ref 13.0–17.0)
MCH: 27.1 pg (ref 26.0–34.0)
MCH: 28.9 pg (ref 26.0–34.0)
MCHC: 31.4 g/dL (ref 30.0–36.0)
MCHC: 33 g/dL (ref 30.0–36.0)
MCV: 86.3 fL (ref 80.0–100.0)
MCV: 87.4 fL (ref 80.0–100.0)
Platelets: 465 10*3/uL — ABNORMAL HIGH (ref 150–400)
Platelets: 433 10*3/uL — ABNORMAL HIGH (ref 150–400)
RBC: 2.4 MIL/uL — ABNORMAL LOW (ref 4.22–5.81)
RBC: 2.53 MIL/uL — ABNORMAL LOW (ref 4.22–5.81)
RDW: 15.7 % — ABNORMAL HIGH (ref 11.5–15.5)
RDW: 15.9 % — ABNORMAL HIGH (ref 11.5–15.5)
WBC: 13.3 10*3/uL — ABNORMAL HIGH (ref 4.0–10.5)
WBC: 12.6 10*3/uL — ABNORMAL HIGH (ref 4.0–10.5)
nRBC: 0 % (ref 0.0–0.2)
nRBC: 0 % (ref 0.0–0.2)

## 2018-11-07 LAB — PROTIME-INR
INR: 1.4 — ABNORMAL HIGH (ref 0.8–1.2)
Prothrombin Time: 16.8 seconds — ABNORMAL HIGH (ref 11.4–15.2)

## 2018-11-07 LAB — PREPARE RBC (CROSSMATCH)

## 2018-11-07 LAB — GLUCOSE, CAPILLARY
Glucose-Capillary: 255 mg/dL — ABNORMAL HIGH (ref 70–99)
Glucose-Capillary: 269 mg/dL — ABNORMAL HIGH (ref 70–99)
Glucose-Capillary: 200 mg/dL — ABNORMAL HIGH (ref 70–99)
Glucose-Capillary: 213 mg/dL — ABNORMAL HIGH (ref 70–99)

## 2018-11-07 MED ORDER — SODIUM CHLORIDE 0.9% IV SOLUTION
Freq: Once | INTRAVENOUS | Status: AC
  Administered 2018-11-08: 09:00:00 via INTRAVENOUS

## 2018-11-07 MED ORDER — INSULIN GLARGINE 100 UNIT/ML ~~LOC~~ SOLN
35.0000 [IU] | Freq: Two times a day (BID) | SUBCUTANEOUS | Status: DC
  Administered 2018-11-07 (×2): 35 [IU] via SUBCUTANEOUS
  Filled 2018-11-07 (×5): qty 0.35

## 2018-11-07 NOTE — Progress Notes (Signed)
Patient's right AKA incision dressing was removed by MD and redressed with gauze and Kerlix by RN.  Will continue to monitor.

## 2018-11-07 NOTE — Progress Notes (Signed)
Right AKA site dressing changed.  Moderate amount of serosanguinous drainage on dressing.  Will continue to monitor.

## 2018-11-07 NOTE — Progress Notes (Signed)
PROGRESS NOTE  Ethan Campbell DGU:[id number] DOB: [birth year] DOA: 11/06/2018 PCP: Arsenio Katz, NP   LOS: 1 day   Brief Narrative / Interim history: 58 year old male with history of recent right AKA about 2 weeks ago, hypertension, hyperlipidemia, seizure disorder, depression, hypothyroidism and coronary peripheral vascular disease presenting with right discomfort swelling and drainage.  Initially presented to [facility], where he was found to be anemic to 6.6 (previously 7.7 on 4/9), and transferred here for further management.  Received 1 unit in route.  Hemodynamically stable on arrival.  Evaluated by orthopedic surgery, Dr. Sharol Given who is planning to take patient to surgery on Friday.  Warfarin on hold.   Subjective: No major events overnight of this morning.  No complaint this morning.  Hemoglobin 6.5 after 1 unit of blood.  INR 1.4.  Patient denies chest pain, dyspnea, abdominal pain, nausea or vomiting.  Assessment & Plan: Principal Problem:   Hematoma of amputation stump of right lower extremity (HCC) Active Problems:   Type II diabetes mellitus, uncontrolled (HCC)   Morbid obesity (HCC)   OBSTRUCTIVE SLEEP APNEA   Essential hypertension, benign   Acute renal failure superimposed on stage 3 chronic kidney disease (HCC)   Diabetic autonomic neuropathy associated with type 2 diabetes mellitus (HCC)   Below-knee amputation of right lower extremity (HCC)   Dehiscence of amputation stump (HCC)   Hematoma of amputation stump of left lower extremity (HCC)   Anticoagulants causing adverse effect in therapeutic use  Acute blood loss anemia likely due to bleeding from right AKA and anticoagulation.  Reportedly INR was 6.6 in regards per admitting provider. -Receiving second unit this morning. -Monitor H&H. -Warfarin on hold.  SCD for VTE prophylaxis.  Orthopedic surgery okay with this. -Plan for AKA revision on Friday by orthopedic surgery. -PRN pain medications  Poorly  controlled IDDM-2 with hypoglycemia.  Not in DKA.  On insulin and Victoza at home. -Change Lantus from 15 units nightly to 35 units twice daily -Continue mealtime and sliding scale insulin -CBG monitoring -Statin  History of DVT/supratherapeutic INR: Reportedly 6.6 prior to transfer.  Received FFP and vitamin K.  INR now 1.4 -Continue monitoring  Atrial fibrillation: Currently rate controlled  -Warfarin on hold -Continue beta-blocker  Chronic diastolic heart failure: -Continue home Lasix Hypertension: Normotensive.  -Continue home blood pressure medications -Discontinue Middaugh drain-no longer on this  OSA -Continue nightly CPAP  CKD 3: Stable History of seizure disorder/depression stable -Continue home medications  Hypothyroidism: Stable -Continue home medications  Morbid obesity: BMI 41.8 -Needs lifestyle change to lose weight    Scheduled Meds: . sodium chloride   Intravenous Once  . aspirin EC  81 mg Oral Daily  . atorvastatin  80 mg Oral q1800  . DULoxetine  60 mg Oral Daily  . fenofibrate  160 mg Oral Daily  . ferrous sulfate  325 mg Oral TID WC  . FLUoxetine  20 mg Oral Daily  . furosemide  40 mg Oral Daily  . gabapentin  300 mg Oral BID  . insulin aspart  0-5 Units Subcutaneous QHS  . insulin aspart  0-9 Units Subcutaneous TID WC  . insulin aspart  8 Units Subcutaneous TID WC  . insulin glargine  35 Units Subcutaneous BID  . levothyroxine  100 mcg Oral QAC breakfast  . metoprolol tartrate  12.5 mg Oral BID  . multivitamin with minerals  1 tablet Oral Daily  . Ensure Max Protein  11 oz Oral TID WC  . tamsulosin  0.4  mg Oral Daily  . tiZANidine  4 mg Oral BID  . vitamin C  500 mg Oral BID  . zinc sulfate  220 mg Oral Daily   Continuous Infusions: . sodium chloride 100 mL/hr at 11/07/18 0500   PRN Meds:.acetaminophen, HYDROcodone-acetaminophen, meclizine, ondansetron **OR** ondansetron (ZOFRAN) IV, polyethylene glycol  DVT prophylaxis: SCD due to  hematoma and blood loss anemia Code Status: Full code Family Communication: None at bedside Disposition Plan: Remains inpatient  Consultants:   Orthopedic surgery  Procedures:   None  Antimicrobials:  None  Objective: Vitals:   11/07/18 0823 11/07/18 0838 11/07/18 1030 11/07/18 1115  BP: (!) 180/70 (!) 157/75  (!) 131/58  Pulse: 84 86 67 65  Resp:   14 13  Temp: 97.9 F (36.6 C) 98.2 F (36.8 C) 98.9 F (37.2 C) 98 F (36.7 C)  TempSrc: Oral Oral Oral Oral  SpO2: 98% 98% 97% 98%  Weight:      Height:        Intake/Output Summary (Last 24 hours) at 11/07/2018 1623 Last data filed at 11/07/2018 1448 Gross per 24 hour  Intake 3434.04 ml  Output 1250 ml  Net 2184.04 ml   Filed Weights   11/06/18 1200  Weight: 128.5 kg    Examination:  GENERAL: Appears well. No acute distress.  EYES - vision grossly intact. Sclera anicteric.  NOSE- no gross deformity or drainage MOUTH - no oral lesions noted THROAT- no swelling or erythema LUNGS:  No IWOB. Good air movement. CTAB.  HEART: RRR. Heart sounds normal. ABD: Bowel sounds present. Soft. Non tender.  MSK/EXT: Left AKA.  Some swelling of the stump.  Dressing clean and dry.  No apparent bleeding. SKIN: no apparent skin lesion.  NEURO: Awake, alert and oriented appropriately.  No gross deficit.  PSYCH: Calm. Normal affect.    Data Reviewed: I have independently reviewed following labs and imaging studies   CBC: Recent Labs  Lab 11/01/18 1230 11/06/18 1848 11/07/18 0433 11/07/18 1253  WBC 13.8* 15.2* 13.3* 12.6*  NEUTROABS  --  12.0*  --   --   HGB 7.7* 6.0* 6.5* 7.3*  HCT 25.2* 19.1* 20.7* 22.1*  MCV 87.2 85.7 86.3 87.4  PLT 549* 467* 465* 366*   Basic Metabolic Panel: Recent Labs  Lab 11/06/18 1848 11/07/18 0433  NA 135 135  K 4.3 4.3  CL 102 103  CO2 25 25  GLUCOSE 354* 245*  BUN 32* 34*  CREATININE 1.65* 1.42*  CALCIUM 8.2* 8.2*   GFR: Estimated Creatinine Clearance: 76.1 mL/min (A) (by  C-G formula based on SCr of 1.42 mg/dL (H)). Liver Function Tests: Recent Labs  Lab 11/07/18 0433  AST 17  ALT 23  ALKPHOS 94  BILITOT 1.0  PROT 5.6*  ALBUMIN 1.6*   No results for input(s): LIPASE, AMYLASE in the last 168 hours. No results for input(s): AMMONIA in the last 168 hours. Coagulation Profile: Recent Labs  Lab 11/06/18 1848 11/07/18 0433  INR 1.5* 1.4*   Cardiac Enzymes: No results for input(s): CKTOTAL, CKMB, CKMBINDEX, TROPONINI in the last 168 hours. BNP (last 3 results) No results for input(s): PROBNP in the last 8760 hours. HbA1C: No results for input(s): HGBA1C in the last 72 hours. CBG: Recent Labs  Lab 11/06/18 1308 11/06/18 1626 11/06/18 2120 11/07/18 0645 11/07/18 1114  GLUCAP 404* 379* 276* 255* 269*   Lipid Profile: No results for input(s): CHOL, HDL, LDLCALC, TRIG, CHOLHDL, LDLDIRECT in the last 72 hours. Thyroid Function Tests: No  results for input(s): TSH, T4TOTAL, FREET4, T3FREE, THYROIDAB in the last 72 hours. Anemia Panel: No results for input(s): VITAMINB12, FOLATE, FERRITIN, TIBC, IRON, RETICCTPCT in the last 72 hours. Urine analysis:    Component Value Date/Time   COLORURINE YELLOW 09/03/2015 1400   APPEARANCEUR CLEAR 09/03/2015 1400   LABSPEC 1.020 09/03/2015 1400   PHURINE 5.0 09/03/2015 1400   GLUCOSEU 500 (A) 09/03/2015 1400   GLUCOSEU 500 (A) 11/14/2013 1016   HGBUR NEGATIVE 09/03/2015 1400   BILIRUBINUR NEGATIVE 09/03/2015 1400   KETONESUR NEGATIVE 09/03/2015 1400   PROTEINUR NEGATIVE 09/03/2015 1400   UROBILINOGEN 0.2 03/24/2015 1530   NITRITE NEGATIVE 09/03/2015 1400   LEUKOCYTESUR NEGATIVE 09/03/2015 1400   Sepsis Labs: Invalid input(s): PROCALCITONIN, LACTICIDVEN  Recent Results (from the past 240 hour(s))  Surgical PCR screen     Status: None   Collection Time: 11/06/18  5:11 PM  Result Value Ref Range Status   MRSA, PCR NEGATIVE NEGATIVE Final   Staphylococcus aureus NEGATIVE NEGATIVE Final    Comment:  (NOTE) The Xpert SA Assay (FDA approved for NASAL specimens in patients 13 years of age and older), is one component of a comprehensive surveillance program. It is not intended to diagnose infection nor to guide or monitor treatment. Performed at [facility], [place] [address] [place], [place] [address]       Radiology Studies: No results found.   Treven Holtman T[facility] Pager [phone]  If 7PM-7AM, please contact night-coverage [email or web address] Password [facility] 11/07/2018, 4:23 PM

## 2018-11-07 NOTE — Progress Notes (Signed)
Patient arrived to 4E room 26 in no acute distress.  Telemetry monitor applied and CCMD notified.  Patient's right AKA incision is wrapped in gauze with moderate drainage noted on dressing.  Admitting MD made aware of patient's arrival.  Patient oriented to unit and room to include call light and phone.  Will await MD orders.

## 2018-11-08 ENCOUNTER — Ambulatory Visit (INDEPENDENT_AMBULATORY_CARE_PROVIDER_SITE_OTHER): Payer: Self-pay | Admitting: Physician Assistant

## 2018-11-08 LAB — CBC
HCT: 22.6 % — ABNORMAL LOW (ref 39.0–52.0)
HCT: 27.2 % — ABNORMAL LOW (ref 39.0–52.0)
Hemoglobin: 7.4 g/dL — ABNORMAL LOW (ref 13.0–17.0)
Hemoglobin: 9 g/dL — ABNORMAL LOW (ref 13.0–17.0)
MCH: 28.8 pg (ref 26.0–34.0)
MCH: 28.8 pg (ref 26.0–34.0)
MCHC: 32.7 g/dL (ref 30.0–36.0)
MCHC: 33.1 g/dL (ref 30.0–36.0)
MCV: 87.9 fL (ref 80.0–100.0)
MCV: 87.2 fL (ref 80.0–100.0)
Platelets: 458 10*3/uL — ABNORMAL HIGH (ref 150–400)
Platelets: 500 10*3/uL — ABNORMAL HIGH (ref 150–400)
RBC: 2.57 MIL/uL — ABNORMAL LOW (ref 4.22–5.81)
RBC: 3.12 MIL/uL — ABNORMAL LOW (ref 4.22–5.81)
RDW: 16.1 % — ABNORMAL HIGH (ref 11.5–15.5)
RDW: 15.9 % — ABNORMAL HIGH (ref 11.5–15.5)
WBC: 11.4 10*3/uL — ABNORMAL HIGH (ref 4.0–10.5)
WBC: 11.4 10*3/uL — ABNORMAL HIGH (ref 4.0–10.5)
nRBC: 0 % (ref 0.0–0.2)
nRBC: 0 % (ref 0.0–0.2)

## 2018-11-08 LAB — BASIC METABOLIC PANEL
Anion gap: 6 (ref 5–15)
BUN: 39 mg/dL — ABNORMAL HIGH (ref 6–20)
CO2: 25 mmol/L (ref 22–32)
Calcium: 8.2 mg/dL — ABNORMAL LOW (ref 8.9–10.3)
Chloride: 103 mmol/L (ref 98–111)
Creatinine, Ser: 1.42 mg/dL — ABNORMAL HIGH (ref 0.61–1.24)
GFR calc Af Amer: >60 mL/min (ref >60)
GFR calc non Af Amer: 54 mL/min — ABNORMAL LOW (ref >60)
Glucose, Bld: 233 mg/dL — ABNORMAL HIGH (ref 70–99)
Potassium: 4.5 mmol/L (ref 3.5–5.1)
Sodium: 134 mmol/L — ABNORMAL LOW (ref 135–145)

## 2018-11-08 LAB — PROTIME-INR
INR: 1.3 — ABNORMAL HIGH (ref 0.8–1.2)
Prothrombin Time: 15.9 seconds — ABNORMAL HIGH (ref 11.4–15.2)

## 2018-11-08 LAB — GLUCOSE, CAPILLARY
Glucose-Capillary: 208 mg/dL — ABNORMAL HIGH (ref 70–99)
Glucose-Capillary: 227 mg/dL — ABNORMAL HIGH (ref 70–99)
Glucose-Capillary: 159 mg/dL — ABNORMAL HIGH (ref 70–99)
Glucose-Capillary: 163 mg/dL — ABNORMAL HIGH (ref 70–99)

## 2018-11-08 LAB — PREPARE RBC (CROSSMATCH)

## 2018-11-08 MED ORDER — SODIUM CHLORIDE 0.9% IV SOLUTION: Freq: Once | INTRAVENOUS | Status: AC

## 2018-11-08 MED ORDER — CHLORHEXIDINE GLUCONATE 4 % EX LIQD
60.0000 mL | Freq: Once | CUTANEOUS | Status: AC
  Administered 2018-11-09: 4 via TOPICAL

## 2018-11-08 MED ORDER — CHLORHEXIDINE GLUCONATE 4 % EX LIQD
60.0000 mL | Freq: Once | CUTANEOUS | Status: AC
  Administered 2018-11-09: 4 via TOPICAL
  Filled 2018-11-08: qty 60

## 2018-11-08 MED ORDER — DEXTROSE 5 % IV SOLN
3.0000 g | INTRAVENOUS | Status: DC
  Filled 2018-11-08: qty 3000

## 2018-11-08 MED ORDER — DEXTROSE 5 % IV SOLN
3.0000 g | INTRAVENOUS | Status: AC
  Administered 2018-11-09: 10:00:00 3 g via INTRAVENOUS
  Filled 2018-11-08: qty 3

## 2018-11-08 MED ORDER — INSULIN GLARGINE 100 UNIT/ML ~~LOC~~ SOLN
40.0000 [IU] | Freq: Two times a day (BID) | SUBCUTANEOUS | Status: DC
  Administered 2018-11-08 – 2018-11-12 (×8): 40 [IU] via SUBCUTANEOUS
  Filled 2018-11-08 (×11): qty 0.4

## 2018-11-08 NOTE — Progress Notes (Signed)
PROGRESS NOTE  Ethan Campbell JME:[id number] DOB: [birth year] DOA: 11/06/2018 PCP: Arsenio Katz, NP   LOS: 2 days   Brief Narrative / Interim history: 58 year old male with history of recent right AKA about 2 weeks ago, hypertension, hyperlipidemia, seizure disorder, depression, hypothyroidism and coronary peripheral vascular disease presenting with right discomfort swelling and drainage.  Initially presented to [facility], where he was found to be anemic to 6.6 (previously 7.7 on 4/9), and transferred here for further management.  Received 1 unit in route.  Hemodynamically stable on arrival.  Evaluated by orthopedic surgery, Dr. Sharol Given who is planning to take patient to surgery on Friday.  Warfarin on hold.   Subjective: No major events overnight of this morning.  No complaint this morning.  Received 2 units of blood so far is fair response.  Hemoglobin stable at 7.4 this morning.  Remains asymptomatic.  No further bleeding from AKA.  Assessment & Plan: Principal Problem:   Hematoma of amputation stump of right lower extremity (HCC) Active Problems:   Type II diabetes mellitus, uncontrolled (HCC)   Morbid obesity (HCC)   OBSTRUCTIVE SLEEP APNEA   Essential hypertension, benign   Acute renal failure superimposed on stage 3 chronic kidney disease (HCC)   Diabetic autonomic neuropathy associated with type 2 diabetes mellitus (HCC)   Below-knee amputation of right lower extremity (HCC)   Dehiscence of amputation stump (HCC)   Hematoma of amputation stump of left lower extremity (HCC)   Anticoagulants causing adverse effect in therapeutic use  Acute blood loss anemia likely due to bleeding from right AKA and anticoagulation.  Reportedly INR was 6.6 in regards per admitting provider.  Received 2 units of blood so far.  Hemoglobin stable at 7.4 this morning -We will go ahead and give him additional 1 unit as he is going to OR tomorrow. -Warfarin on hold.  SCD for VTE prophylaxis.   Orthopedic surgery okay with this. -Plan for AKA revision on Friday by orthopedic surgery. -PRN pain medications  Poorly controlled IDDM-2 with hypoglycemia.  Not in DKA.  On insulin and Victoza at home. -Increase Lantus to 40 units twice daily -Continue mealtime and sliding scale insulin -CBG monitoring -Statin  History of DVT/supratherapeutic INR: Reportedly 6.6 prior to transfer.  Received FFP and vitamin K.  INR now 1.4 -Continue monitoring  Atrial fibrillation: Currently rate controlled  -Warfarin on hold -Continue beta-blocker  Chronic diastolic heart failure: -Continue home Lasix  Hypertension: Normotensive.  -Continue home blood pressure medications -Discontinue midodrine-no longer on this  OSA -Continue nightly CPAP  CKD 3: Stable  History of seizure disorder/depression stable -Continue home medications  Hypothyroidism: Stable -Continue home medications  Morbid obesity: BMI 41.8 -Needs lifestyle change to lose weight  Scheduled Meds: . aspirin EC  81 mg Oral Daily  . atorvastatin  80 mg Oral q1800  . [START ON 11/09/2018] chlorhexidine  60 mL Topical Once  . DULoxetine  60 mg Oral Daily  . fenofibrate  160 mg Oral Daily  . ferrous sulfate  325 mg Oral TID WC  . FLUoxetine  20 mg Oral Daily  . furosemide  40 mg Oral Daily  . gabapentin  300 mg Oral BID  . insulin aspart  0-5 Units Subcutaneous QHS  . insulin aspart  0-9 Units Subcutaneous TID WC  . insulin aspart  8 Units Subcutaneous TID WC  . insulin glargine  40 Units Subcutaneous BID  . levothyroxine  100 mcg Oral QAC breakfast  . metoprolol tartrate  12.5 mg  Oral BID  . multivitamin with minerals  1 tablet Oral Daily  . Ensure Max Protein  11 oz Oral TID WC  . tamsulosin  0.4 mg Oral Daily  . tiZANidine  4 mg Oral BID  . vitamin C  500 mg Oral BID  . zinc sulfate  220 mg Oral Daily   Continuous Infusions: . sodium chloride 100 mL/hr at 11/08/18 0700  . [START ON 11/09/2018]  ceFAZolin (ANCEF)  IV     PRN Meds:.acetaminophen, HYDROcodone-acetaminophen, meclizine, ondansetron **OR** ondansetron (ZOFRAN) IV, polyethylene glycol  DVT prophylaxis: SCD due to hematoma and blood loss anemia Code Status: Full code Family Communication: None at bedside Disposition Plan: Remains inpatient  Consultants:   Orthopedic surgery  Procedures:   None  Antimicrobials:  None  Objective: Vitals:   11/08/18 0839 11/08/18 0854 11/08/18 0858 11/08/18 1209  BP:  (!) 165/61 (!) 158/64   Pulse:  77 77   Resp:  (!) 21    Temp: 98.5 F (36.9 C) 98.4 F (36.9 C)  98.3 F (36.8 C)  TempSrc: Oral Oral  Oral  SpO2:  99%    Weight:      Height:        Intake/Output Summary (Last 24 hours) at 11/08/2018 1252 Last data filed at 11/08/2018 1209 Gross per 24 hour  Intake 2609.27 ml  Output 2175 ml  Net 434.27 ml   Filed Weights   11/06/18 1200  Weight: 128.5 kg    Examination:  GENERAL: Appears well. No acute distress.  EYES - vision grossly intact. Sclera anicteric.  NOSE- no gross deformity or drainage MOUTH - no oral lesions noted THROAT- no swelling or erythema LUNGS:  No IWOB. Good air movement. CTAB.  HEART: RRR. Heart sounds normal. ABD: Bowel sounds present. Soft. Non tender.  MSK/EXT: Clean and dry dressing over right AKA. SKIN: no apparent skin lesion.  NEURO: Awake, alert and oriented appropriately.  No gross deficit.  PSYCH: Calm. Normal affect.    Data Reviewed: I have independently reviewed following labs and imaging studies   CBC: Recent Labs  Lab 11/06/18 1848 11/07/18 0433 11/07/18 1253 11/08/18 0334  WBC 15.2* 13.3* 12.6* 11.4*  NEUTROABS 12.0*  --   --   --   HGB 6.0* 6.5* 7.3* 7.4*  HCT 19.1* 20.7* 22.1* 22.6*  MCV 85.7 86.3 87.4 87.9  PLT 467* 465* 433* 009*   Basic Metabolic Panel: Recent Labs  Lab 11/06/18 1848 11/07/18 0433 11/08/18 0334  NA 135 135 134*  K 4.3 4.3 4.5  CL 102 103 103  CO2 25 25 25   GLUCOSE 354* 245* 233*  BUN 32*  34* 39*  CREATININE 1.65* 1.42* 1.42*  CALCIUM 8.2* 8.2* 8.2*   GFR: Estimated Creatinine Clearance: 76.1 mL/min (A) (by C-G formula based on SCr of 1.42 mg/dL (H)). Liver Function Tests: Recent Labs  Lab 11/07/18 0433  AST 17  ALT 23  ALKPHOS 94  BILITOT 1.0  PROT 5.6*  ALBUMIN 1.6*   No results for input(s): LIPASE, AMYLASE in the last 168 hours. No results for input(s): AMMONIA in the last 168 hours. Coagulation Profile: Recent Labs  Lab 11/06/18 1848 11/07/18 0433  INR 1.5* 1.4*   Cardiac Enzymes: No results for input(s): CKTOTAL, CKMB, CKMBINDEX, TROPONINI in the last 168 hours. BNP (last 3 results) No results for input(s): PROBNP in the last 8760 hours. HbA1C: No results for input(s): HGBA1C in the last 72 hours. CBG: Recent Labs  Lab 11/07/18 1114 11/07/18 1647  11/07/18 2115 11/08/18 0635 11/08/18 1125  GLUCAP 269* 200* 213* 208* 227*   Lipid Profile: No results for input(s): CHOL, HDL, LDLCALC, TRIG, CHOLHDL, LDLDIRECT in the last 72 hours. Thyroid Function Tests: No results for input(s): TSH, T4TOTAL, FREET4, T3FREE, THYROIDAB in the last 72 hours. Anemia Panel: No results for input(s): VITAMINB12, FOLATE, FERRITIN, TIBC, IRON, RETICCTPCT in the last 72 hours. Urine analysis:    Component Value Date/Time   COLORURINE YELLOW 09/03/2015 1400   APPEARANCEUR CLEAR 09/03/2015 1400   LABSPEC 1.020 09/03/2015 1400   PHURINE 5.0 09/03/2015 1400   GLUCOSEU 500 (A) 09/03/2015 1400   GLUCOSEU 500 (A) 11/14/2013 1016   HGBUR NEGATIVE 09/03/2015 1400   BILIRUBINUR NEGATIVE 09/03/2015 1400   KETONESUR NEGATIVE 09/03/2015 1400   PROTEINUR NEGATIVE 09/03/2015 1400   UROBILINOGEN 0.2 03/24/2015 1530   NITRITE NEGATIVE 09/03/2015 1400   LEUKOCYTESUR NEGATIVE 09/03/2015 1400   Sepsis Labs: Invalid input(s): PROCALCITONIN, LACTICIDVEN  Recent Results (from the past 240 hour(s))  Surgical PCR screen     Status: None   Collection Time: 11/06/18  5:11 PM   Result Value Ref Range Status   MRSA, PCR NEGATIVE NEGATIVE Final   Staphylococcus aureus NEGATIVE NEGATIVE Final    Comment: (NOTE) The Xpert SA Assay (FDA approved for NASAL specimens in patients 66 years of age and older), is one component of a comprehensive surveillance program. It is not intended to diagnose infection nor to guide or monitor treatment. Performed at [facility], [place] [address] [place], [place] [address]       Radiology Studies: No results found.   Susano Cleckler T. [facility] Pager [phone]  If 7PM-7AM, please contact night-coverage [email or web address] Password [facility] 11/08/2018, 12:52 PM

## 2018-11-08 NOTE — Progress Notes (Signed)
Right AKA stump incision dressing changed.  Minimal drainage.  Will continue to monitor.

## 2018-11-09 ENCOUNTER — Encounter (HOSPITAL_COMMUNITY)
Admission: AD | Disposition: A | Discharge status: Home Health | Payer: Self-pay | Source: Other Acute Inpatient Hospital | Attending: Orthopedic Surgery

## 2018-11-09 ENCOUNTER — Inpatient Hospital Stay (HOSPITAL_COMMUNITY): Payer: Medicaid Other | Admitting: Anesthesiology

## 2018-11-09 ENCOUNTER — Inpatient Hospital Stay (HOSPITAL_COMMUNITY): Admission: RE | Admit: 2018-11-09 | Payer: Medicaid Other | Source: Home / Self Care | Admitting: Orthopedic Surgery

## 2018-11-09 ENCOUNTER — Encounter (HOSPITAL_COMMUNITY): Payer: Self-pay | Admitting: *Deleted

## 2018-11-09 DIAGNOSIS — Z89612 Acquired absence of left leg above knee: Secondary | ICD-10-CM

## 2018-11-09 HISTORY — PX: STUMP REVISION: SHX6102

## 2018-11-09 LAB — BPAM RBC
Blood Product Expiration Date: 202004182359
Blood Product Expiration Date: 202004212359
Blood Product Expiration Date: 202004302359
ISSUE DATE / TIME: 202004142333
ISSUE DATE / TIME: 202004150818
ISSUE DATE / TIME: 202004160847
Unit Type and Rh: 5100
Unit Type and Rh: 9500
Unit Type and Rh: 9500

## 2018-11-09 LAB — GLUCOSE, CAPILLARY
Glucose-Capillary: 71 mg/dL (ref 70–99)
Glucose-Capillary: 104 mg/dL — ABNORMAL HIGH (ref 70–99)
Glucose-Capillary: 68 mg/dL — ABNORMAL LOW (ref 70–99)
Glucose-Capillary: 101 mg/dL — ABNORMAL HIGH (ref 70–99)
Glucose-Capillary: 259 mg/dL — ABNORMAL HIGH (ref 70–99)
Glucose-Capillary: 388 mg/dL — ABNORMAL HIGH (ref 70–99)

## 2018-11-09 LAB — CBC
HCT: 25.7 % — ABNORMAL LOW (ref 39.0–52.0)
Hemoglobin: 8 g/dL — ABNORMAL LOW (ref 13.0–17.0)
MCH: 28 pg (ref 26.0–34.0)
MCHC: 31.1 g/dL (ref 30.0–36.0)
MCV: 89.9 fL (ref 80.0–100.0)
Platelets: 487 10*3/uL — ABNORMAL HIGH (ref 150–400)
RBC: 2.86 MIL/uL — ABNORMAL LOW (ref 4.22–5.81)
RDW: 16.3 % — ABNORMAL HIGH (ref 11.5–15.5)
WBC: 9.6 10*3/uL (ref 4.0–10.5)
nRBC: 0 % (ref 0.0–0.2)

## 2018-11-09 LAB — BASIC METABOLIC PANEL
Anion gap: 7 (ref 5–15)
BUN: 35 mg/dL — ABNORMAL HIGH (ref 6–20)
CO2: 28 mmol/L (ref 22–32)
Calcium: 8.6 mg/dL — ABNORMAL LOW (ref 8.9–10.3)
Chloride: 105 mmol/L (ref 98–111)
Creatinine, Ser: 1.27 mg/dL — ABNORMAL HIGH (ref 0.61–1.24)
GFR calc Af Amer: >60 mL/min (ref >60)
GFR calc non Af Amer: >60 mL/min (ref >60)
Glucose, Bld: 112 mg/dL — ABNORMAL HIGH (ref 70–99)
Potassium: 4.4 mmol/L (ref 3.5–5.1)
Sodium: 140 mmol/L (ref 135–145)

## 2018-11-09 LAB — TYPE AND SCREEN
ABO/RH(D): O POS
Antibody Screen: NEGATIVE
Unit division: 0
Unit division: 0
Unit division: 0

## 2018-11-09 LAB — PROTIME-INR
INR: 1.3 — ABNORMAL HIGH (ref 0.8–1.2)
Prothrombin Time: 15.7 seconds — ABNORMAL HIGH (ref 11.4–15.2)

## 2018-11-09 SURGERY — REVISION, AMPUTATION SITE
Anesthesia: General | Site: Leg Upper | Laterality: Right

## 2018-11-09 MED ORDER — DEXTROSE 50 % IV SOLN
INTRAVENOUS | Status: AC
  Administered 2018-11-09: 11:00:00 25 mL via INTRAVENOUS
  Filled 2018-11-09: qty 50

## 2018-11-09 MED ORDER — PROPOFOL 10 MG/ML IV BOLUS
INTRAVENOUS | Status: AC
  Filled 2018-11-09: qty 20

## 2018-11-09 MED ORDER — ACETAMINOPHEN 325 MG PO TABS: 325.0000 mg | ORAL_TABLET | Freq: Four times a day (QID) | ORAL | Status: DC | PRN

## 2018-11-09 MED ORDER — LIDOCAINE 2% (20 MG/ML) 5 ML SYRINGE
INTRAMUSCULAR | Status: DC | PRN
  Administered 2018-11-09: 100 mg via INTRAVENOUS

## 2018-11-09 MED ORDER — MIDAZOLAM HCL 5 MG/5ML IJ SOLN
INTRAMUSCULAR | Status: DC | PRN
  Administered 2018-11-09: 2 mg via INTRAVENOUS

## 2018-11-09 MED ORDER — MEPERIDINE HCL 50 MG/ML IJ SOLN: 6.2500 mg | INTRAMUSCULAR | Status: DC | PRN

## 2018-11-09 MED ORDER — MORPHINE SULFATE (PF) 2 MG/ML IV SOLN: 0.5000 mg | INTRAVENOUS | Status: DC | PRN

## 2018-11-09 MED ORDER — MIDAZOLAM HCL 2 MG/2ML IJ SOLN
INTRAMUSCULAR | Status: AC
  Filled 2018-11-09: qty 2

## 2018-11-09 MED ORDER — FENTANYL CITRATE (PF) 100 MCG/2ML IJ SOLN
25.0000 ug | INTRAMUSCULAR | Status: DC | PRN
  Administered 2018-11-09 (×2): 50 ug via INTRAVENOUS

## 2018-11-09 MED ORDER — ACETAMINOPHEN 325 MG PO TABS: 325.0000 mg | ORAL_TABLET | ORAL | Status: DC | PRN

## 2018-11-09 MED ORDER — OXYCODONE HCL 5 MG PO TABS
ORAL_TABLET | ORAL | Status: AC
  Administered 2018-11-09: 5 mg via ORAL
  Filled 2018-11-09: qty 1

## 2018-11-09 MED ORDER — HYDROCODONE-ACETAMINOPHEN 7.5-325 MG PO TABS
1.0000 | ORAL_TABLET | ORAL | Status: DC | PRN
  Administered 2018-11-12: 2 via ORAL
  Filled 2018-11-09: qty 2

## 2018-11-09 MED ORDER — PROPOFOL 10 MG/ML IV BOLUS
INTRAVENOUS | Status: DC | PRN
  Administered 2018-11-09: 150 mg via INTRAVENOUS

## 2018-11-09 MED ORDER — CEFAZOLIN SODIUM-DEXTROSE 1-4 GM/50ML-% IV SOLN
1.0000 g | Freq: Three times a day (TID) | INTRAVENOUS | Status: DC
  Administered 2018-11-09 – 2018-11-11 (×6): 1 g via INTRAVENOUS
  Filled 2018-11-09 (×9): qty 50

## 2018-11-09 MED ORDER — TRANEXAMIC ACID 1000 MG/10ML IV SOLN
2000.0000 mg | INTRAVENOUS | Status: DC
  Filled 2018-11-09: qty 20

## 2018-11-09 MED ORDER — SUCCINYLCHOLINE CHLORIDE 200 MG/10ML IV SOSY
PREFILLED_SYRINGE | INTRAVENOUS | Status: AC
  Filled 2018-11-09: qty 10

## 2018-11-09 MED ORDER — ONDANSETRON HCL 4 MG/2ML IJ SOLN
INTRAMUSCULAR | Status: AC
  Filled 2018-11-09: qty 2

## 2018-11-09 MED ORDER — METOCLOPRAMIDE HCL 5 MG PO TABS: 5.0000 mg | ORAL_TABLET | Freq: Three times a day (TID) | ORAL | Status: DC | PRN

## 2018-11-09 MED ORDER — 0.9 % SODIUM CHLORIDE (POUR BTL) OPTIME
TOPICAL | Status: DC | PRN
  Administered 2018-11-09: 10:00:00 1000 mL

## 2018-11-09 MED ORDER — TRANEXAMIC ACID-NACL 1000-0.7 MG/100ML-% IV SOLN
INTRAVENOUS | Status: AC
  Filled 2018-11-09: qty 100

## 2018-11-09 MED ORDER — ACETAMINOPHEN 500 MG PO TABS
500.0000 mg | ORAL_TABLET | Freq: Four times a day (QID) | ORAL | Status: AC
  Administered 2018-11-09 – 2018-11-10 (×4): 500 mg via ORAL
  Filled 2018-11-09 (×4): qty 1

## 2018-11-09 MED ORDER — FENTANYL CITRATE (PF) 250 MCG/5ML IJ SOLN
INTRAMUSCULAR | Status: AC
  Filled 2018-11-09: qty 5

## 2018-11-09 MED ORDER — SUCCINYLCHOLINE CHLORIDE 200 MG/10ML IV SOSY
PREFILLED_SYRINGE | INTRAVENOUS | Status: DC | PRN
  Administered 2018-11-09: 120 mg via INTRAVENOUS

## 2018-11-09 MED ORDER — ACETAMINOPHEN 160 MG/5ML PO SOLN: 325.0000 mg | ORAL | Status: DC | PRN

## 2018-11-09 MED ORDER — TRANEXAMIC ACID-NACL 1000-0.7 MG/100ML-% IV SOLN
1000.0000 mg | INTRAVENOUS | Status: AC
  Administered 2018-11-09: 1000 mg via INTRAVENOUS

## 2018-11-09 MED ORDER — FENTANYL CITRATE (PF) 100 MCG/2ML IJ SOLN
INTRAMUSCULAR | Status: DC | PRN
  Administered 2018-11-09: 150 ug via INTRAVENOUS

## 2018-11-09 MED ORDER — TRANEXAMIC ACID 1000 MG/10ML IV SOLN
INTRAVENOUS | Status: DC | PRN
  Administered 2018-11-09: 2000 mg via TOPICAL

## 2018-11-09 MED ORDER — HYDROCODONE-ACETAMINOPHEN 5-325 MG PO TABS
1.0000 | ORAL_TABLET | ORAL | Status: DC | PRN
  Administered 2018-11-09 – 2018-11-11 (×7): 2 via ORAL
  Filled 2018-11-09 (×7): qty 2

## 2018-11-09 MED ORDER — EPHEDRINE SULFATE-NACL 50-0.9 MG/10ML-% IV SOSY
PREFILLED_SYRINGE | INTRAVENOUS | Status: DC | PRN
  Administered 2018-11-09 (×2): 15 mg via INTRAVENOUS

## 2018-11-09 MED ORDER — ONDANSETRON HCL 4 MG PO TABS: 4.0000 mg | ORAL_TABLET | Freq: Four times a day (QID) | ORAL | Status: DC | PRN

## 2018-11-09 MED ORDER — METOCLOPRAMIDE HCL 5 MG/ML IJ SOLN: 5.0000 mg | Freq: Three times a day (TID) | INTRAMUSCULAR | Status: DC | PRN

## 2018-11-09 MED ORDER — DEXTROSE 50 % IV SOLN
25.0000 mL | Freq: Once | INTRAVENOUS | Status: AC
  Administered 2018-11-09: 11:00:00 25 mL via INTRAVENOUS

## 2018-11-09 MED ORDER — ONDANSETRON HCL 4 MG/2ML IJ SOLN: 4.0000 mg | Freq: Once | INTRAMUSCULAR | Status: DC | PRN

## 2018-11-09 MED ORDER — LACTATED RINGERS IV SOLN
INTRAVENOUS | Status: DC
  Administered 2018-11-09: 08:00:00 via INTRAVENOUS

## 2018-11-09 MED ORDER — DOCUSATE SODIUM 100 MG PO CAPS
100.0000 mg | ORAL_CAPSULE | Freq: Two times a day (BID) | ORAL | Status: DC
  Administered 2018-11-09 – 2018-11-11 (×4): 100 mg via ORAL
  Filled 2018-11-09 (×7): qty 1

## 2018-11-09 MED ORDER — DEXAMETHASONE SODIUM PHOSPHATE 10 MG/ML IJ SOLN
INTRAMUSCULAR | Status: AC
  Filled 2018-11-09: qty 1

## 2018-11-09 MED ORDER — OXYCODONE HCL 5 MG PO TABS
5.0000 mg | ORAL_TABLET | Freq: Once | ORAL | Status: AC | PRN
  Administered 2018-11-09: 11:00:00 5 mg via ORAL

## 2018-11-09 MED ORDER — OXYCODONE HCL 5 MG/5ML PO SOLN: 5.0000 mg | Freq: Once | ORAL | Status: AC | PRN

## 2018-11-09 MED ORDER — LIDOCAINE 2% (20 MG/ML) 5 ML SYRINGE
INTRAMUSCULAR | Status: AC
  Filled 2018-11-09: qty 5

## 2018-11-09 MED ORDER — FENTANYL CITRATE (PF) 100 MCG/2ML IJ SOLN
INTRAMUSCULAR | Status: AC
  Administered 2018-11-09: 50 ug via INTRAVENOUS
  Filled 2018-11-09: qty 2

## 2018-11-09 MED ORDER — ONDANSETRON HCL 4 MG/2ML IJ SOLN: 4.0000 mg | Freq: Four times a day (QID) | INTRAMUSCULAR | Status: DC | PRN

## 2018-11-09 SURGICAL SUPPLY — 41 items
APL PRP STRL LF DISP 70% ISPRP (MISCELLANEOUS) ×1
BLADE SAW RECIP 87.9 MT (BLADE) ×2 IMPLANT
BLADE SURG 21 STRL SS (BLADE) ×3 IMPLANT
BNDG COHESIVE 4X5 TAN STRL (GAUZE/BANDAGES/DRESSINGS) ×2 IMPLANT
CANISTER WOUND CARE 500ML ATS (WOUND CARE) ×1 IMPLANT
CHLORAPREP W/TINT 26 (MISCELLANEOUS) ×2 IMPLANT
CONT SPEC STER OR (MISCELLANEOUS) ×4 IMPLANT
COVER SURGICAL LIGHT HANDLE (MISCELLANEOUS) ×3 IMPLANT
COVER WAND RF STERILE (DRAPES) ×1 IMPLANT
DECANTER SPIKE VIAL GLASS SM (MISCELLANEOUS) ×2 IMPLANT
DRAPE EXTREMITY T 121X128X90 (DISPOSABLE) ×3 IMPLANT
DRAPE HALF SHEET 40X57 (DRAPES) ×3 IMPLANT
DRAPE INCISE IOBAN 66X45 STRL (DRAPES) ×3 IMPLANT
DRAPE U-SHAPE 47X51 STRL (DRAPES) ×4 IMPLANT
DRESSING PREVENA PLUS CUSTOM (GAUZE/BANDAGES/DRESSINGS) ×1 IMPLANT
DRSG PREVENA PLUS CUSTOM (GAUZE/BANDAGES/DRESSINGS) ×3
DURAPREP 26ML APPLICATOR (WOUND CARE) ×1 IMPLANT
ELECT REM PT RETURN 9FT ADLT (ELECTROSURGICAL) ×3
ELECTRODE REM PT RTRN 9FT ADLT (ELECTROSURGICAL) ×1 IMPLANT
GLOVE BIOGEL PI IND STRL 9 (GLOVE) ×1 IMPLANT
GLOVE BIOGEL PI INDICATOR 9 (GLOVE) ×2
GLOVE SURG ORTHO 9.0 STRL STRW (GLOVE) ×3 IMPLANT
GOWN STRL REUS W/ TWL XL LVL3 (GOWN DISPOSABLE) ×2 IMPLANT
GOWN STRL REUS W/TWL XL LVL3 (GOWN DISPOSABLE) ×6
KIT BASIN OR (CUSTOM PROCEDURE TRAY) ×3 IMPLANT
KIT TURNOVER KIT B (KITS) ×3 IMPLANT
MANIFOLD NEPTUNE II (INSTRUMENTS) ×3 IMPLANT
MANIFOLD NEPTUNE WASTE (CANNULA) ×2 IMPLANT
NS IRRIG 1000ML POUR BTL (IV SOLUTION) ×3 IMPLANT
PACK GENERAL/GYN (CUSTOM PROCEDURE TRAY) ×3 IMPLANT
PAD ARMBOARD 7.5X6 YLW CONV (MISCELLANEOUS) ×3 IMPLANT
PENCIL SMOKE EVACUATOR (MISCELLANEOUS) ×2 IMPLANT
PREVENA RESTOR ARTHOFORM 33X30 (CANNISTER) ×2 IMPLANT
PREVENA RESTOR ARTHOFORM 46X30 (CANNISTER) ×3 IMPLANT
SPONGE LAP 18X18 RF (DISPOSABLE) ×4 IMPLANT
STAPLER VISISTAT 35W (STAPLE) ×2 IMPLANT
STOCKINETTE IMPERVIOUS 9X36 MD (GAUZE/BANDAGES/DRESSINGS) ×2 IMPLANT
SUT ETHILON 2 0 PSLX (SUTURE) ×6 IMPLANT
SUT SILK 2 0 (SUTURE) ×9
SUT SILK 2-0 18XBRD TIE 12 (SUTURE) IMPLANT
TOWEL OR 17X26 10 PK STRL BLUE (TOWEL DISPOSABLE) ×3 IMPLANT

## 2018-11-09 NOTE — TOC Progression Note (Signed)
Transition of Care [facility]) - Progression Note    Patient Details  Name: Ethan Campbell MRN: [id number] Date of Birth: [birth year]  Transition of [facility]) CM/SW [place], [place] Phone Number: [phone] 11/09/2018, 4:55 PM  Clinical Narrative:     CSW talked with the patient and his significant other - currently the patient and his significant other, Hilda Blades would like for the patient to discharge home. CSW will continue to follow .       Expected Discharge Plan and Services                                     Social Determinants of Health (SDOH) Interventions    Readmission Risk Interventions Readmission Risk Prevention Plan 10/25/2018  Transportation Screening Complete  PCP or Specialist Appt within 3-5 Days Complete  HRI or [place] Complete  HRI or Home Care Consult comments N/A  Social Work Consult for [place] Planning/Counseling Complete  Palliative Care Screening Not Applicable  Medication Review Press photographer) Complete  Some recent data might be hidden

## 2018-11-09 NOTE — Progress Notes (Signed)
Late note: RT offered to place pt on CPAP dream station for the night and pt states he can apply himself. Pt setting is auto titrate high 20 low 8 with no O2 bled in to the system. RT will continue to monitor.

## 2018-11-09 NOTE — Anesthesia Preprocedure Evaluation (Signed)
Anesthesia Evaluation  Patient identified by MRN, date of birth, ID band Patient awake    Reviewed: Allergy & Precautions, H&P , NPO status , Patient's Chart, lab work & pertinent test results  Airway Mallampati: II  TM Distance: <3 FB Neck ROM: Full    Dental no notable dental hx. (+) Missing,    Pulmonary sleep apnea and Continuous Positive Airway Pressure Ventilation , former smoker,    breath sounds clear to auscultation + decreased breath sounds      Cardiovascular hypertension, Normal cardiovascular exam+ pacemaker  Rhythm:Regular Rate:Normal  EF 60%   Neuro/Psych negative neurological ROS  negative psych ROS   GI/Hepatic negative GI ROS, Neg liver ROS,   Endo/Other  diabetesHypothyroidism Morbid obesity  Renal/GU Renal InsufficiencyRenal disease  negative genitourinary   Musculoskeletal negative musculoskeletal ROS (+)   Abdominal   Peds negative pediatric ROS (+)  Hematology  (+) Blood dyscrasia, anemia ,   Anesthesia Other Findings   Reproductive/Obstetrics negative OB ROS                             Anesthesia Physical  Anesthesia Plan  ASA: III and emergent  Anesthesia Plan: General   Post-op Pain Management:    Induction: Intravenous and Rapid sequence  PONV Risk Score and Plan: 2 and Ondansetron and Treatment may vary due to age or medical condition  Airway Management Planned: Oral ETT  Additional Equipment:   Intra-op Plan:   Post-operative Plan: Extubation in OR  Informed Consent: I have reviewed the patients History and Physical, chart, labs and discussed the procedure including the risks, benefits and alternatives for the proposed anesthesia with the patient or authorized representative who has indicated his/her understanding and acceptance.     Dental advisory given  Plan Discussed with: CRNA, Surgeon and Anesthesiologist  Anesthesia Plan Comments:         Anesthesia Quick Evaluation

## 2018-11-09 NOTE — Transfer of Care (Signed)
Immediate Anesthesia Transfer of Care Note  Patient: Ethan Campbell  Procedure(s) Performed: REVISION RIGHT ABOVE KNEE AMPUTATION (Right Leg Upper)  Patient Location: PACU  Anesthesia Type:General  Level of Consciousness: drowsy and patient cooperative  Airway & Oxygen Therapy: Patient Spontanous Breathing and Patient connected to nasal cannula oxygen  Post-op Assessment: Report given to RN, Post -op Vital signs reviewed and stable and Patient moving all extremities  Post vital signs: Reviewed and stable  Last Vitals:  Vitals Value Taken Time  BP 129/56 11/09/2018 11:02 AM  Temp    Pulse 66 11/09/2018 11:02 AM  Resp 10 11/09/2018 11:02 AM  SpO2 100 % 11/09/2018 11:02 AM  Vitals shown include unvalidated device data.  Last Pain:  Vitals:   11/09/18 0739  TempSrc: Oral  PainSc:       Patients Stated Pain Goal: 0 (80/88/11 0315)  Complications: No apparent anesthesia complications

## 2018-11-09 NOTE — Anesthesia Postprocedure Evaluation (Signed)
Anesthesia Post Note  Patient: CONLEE SLITER  Procedure(s) Performed: REVISION RIGHT ABOVE KNEE AMPUTATION (Right Leg Upper)     Patient location during evaluation: PACU Anesthesia Type: General Level of consciousness: awake and alert Pain management: pain level controlled Vital Signs Assessment: post-procedure vital signs reviewed and stable Respiratory status: spontaneous breathing, nonlabored ventilation, respiratory function stable and patient connected to nasal cannula oxygen Cardiovascular status: blood pressure returned to baseline and stable Postop Assessment: no apparent nausea or vomiting Anesthetic complications: no    Last Vitals:  Vitals:   11/09/18 1205 11/09/18 1219  BP: (!) 141/66 (!) 147/59  Pulse: 68 69  Resp: 10 13  Temp:  36.8 C  SpO2: 96% 100%    Last Pain:  Vitals:   11/09/18 1219  TempSrc: Oral  PainSc:                  Trinia Georgi

## 2018-11-09 NOTE — Anesthesia Procedure Notes (Signed)
Procedure Name: Intubation Date/Time: 11/09/2018 10:04 AM Performed by: Moshe Salisbury, CRNA Pre-anesthesia Checklist: Patient identified, Emergency Drugs available, Suction available and Patient being monitored Patient Re-evaluated:Patient Re-evaluated prior to induction Oxygen Delivery Method: Circle System Utilized Preoxygenation: Pre-oxygenation with 100% oxygen Induction Type: IV induction and Rapid sequence Laryngoscope Size: Mac and 4 Grade View: Grade I Tube type: Oral Tube size: 8.0 mm Number of attempts: 1 Airway Equipment and Method: Stylet Placement Confirmation: ETT inserted through vocal cords under direct vision,  positive ETCO2 and breath sounds checked- equal and bilateral Secured at: 22 cm Tube secured with: Tape Dental Injury: Teeth and Oropharynx as per pre-operative assessment

## 2018-11-09 NOTE — Interval H&P Note (Signed)
History and Physical Interval Note:  11/09/2018 6:45 AM  Ethan Campbell  has presented today for surgery, with the diagnosis of Hematoma Right Above Knee Amputation.  The various methods of treatment have been discussed with the patient and family. After consideration of risks, benefits and other options for treatment, the patient has consented to  Procedure(s): REVISION RIGHT ABOVE KNEE AMPUTATION (Right) as a surgical intervention.  The patient's history has been reviewed, patient examined, no change in status, stable for surgery.  I have reviewed the patient's chart and labs.  Questions were answered to the patient's satisfaction.     Newt Minion

## 2018-11-09 NOTE — Progress Notes (Addendum)
PROGRESS NOTE  Ethan Campbell LEX:[id number] DOB: 03-06-1961 DOA: 11/06/2018 PCP: Arsenio Katz, NP   LOS: 3 days   Brief Narrative / Interim history: 58 year old male with history of recent right AKA about 2 weeks ago, hypertension, hyperlipidemia, seizure disorder, depression, hypothyroidism and coronary peripheral vascular disease presenting with right discomfort swelling and drainage.  Initially presented to [facility], where he was found to be anemic to 6.6 (previously 7.7 on 4/9), and transferred here for further management.  Received 1 unit in route. Hemodynamically stable on arrival.  Patient had revision of right AKA on 4/17 by Dr. Sharol Given.    Subjective: No major events overnight. Returned from OR after right AKA revision.  No complaints.  Pain fairly controlled.  Denies chest pain, dyspnea or palpitation.   Assessment & Plan: Principal Problem:   Hematoma of amputation stump of right lower extremity (HCC) Active Problems:   Type II diabetes mellitus, uncontrolled (HCC)   Morbid obesity (HCC)   OBSTRUCTIVE SLEEP APNEA   Essential hypertension, benign   Acute renal failure superimposed on stage 3 chronic kidney disease (HCC)   Diabetic autonomic neuropathy associated with type 2 diabetes mellitus (HCC)   Below-knee amputation of right lower extremity (HCC)   Dehiscence of amputation stump (HCC)   Hematoma of amputation stump of left lower extremity (HCC)   Anticoagulants causing adverse effect in therapeutic use   S/P AKA (above knee amputation) unilateral, left (HCC)  Acute blood loss anemia likely due to bleeding from right AKA and anticoagulation.  Reportedly INR was 6.6 in regards per admitting provider.  Received 3 units of blood so far.  -Right AKA revision and wound vac by Dr. Sharol Given 4/17. -Orthopedic surgery recommendations  -Weightbearing as tolerated on the left  -Ancef 4/17-- probably for 4 weeks  -follow tissue cultures  -Wound VAC for 1 week after  discharge and follow-up in 1 wk  -Anticipate SNF -Warfarin on hold.  SCD for VTE prophylaxis. -PT/OT  Poorly controlled IDDM-2 with hypoglycemia.  Not in DKA.  On insulin and Victoza at home. CBG 7--100 -Decrease Lantus to 35 units twice daily -Continue mealtime and sliding scale insulin -CBG monitoring -Statin  History of DVT/supratherapeutic INR: Reportedly 6.6 prior to transfer.  Received FFP and vitamin K.  INR now 1.3 -Continue monitoring  Atrial fibrillation: Currently rate controlled  -Warfarin on hold -Continue beta-blocker  Chronic diastolic heart failure: Euvolemic. -Continue home Lasix -Daily weight, intake output and renal function.  Hypertension: Normotensive.  -Continue home blood pressure medications -Discontinue midodrine-no longer on this  OSA -Continue nightly CPAP  CKD 3: Stable  History of seizure disorder/depression stable -Continue home medications  Hypothyroidism: Stable -Continue home medications  Morbid obesity: BMI 41.8 -Needs lifestyle change to lose weight  Scheduled Meds: . acetaminophen  500 mg Oral Q6H  . atorvastatin  80 mg Oral q1800  . docusate sodium  100 mg Oral BID  . DULoxetine  60 mg Oral Daily  . fenofibrate  160 mg Oral Daily  . ferrous sulfate  325 mg Oral TID WC  . FLUoxetine  20 mg Oral Daily  . furosemide  40 mg Oral Daily  . gabapentin  300 mg Oral BID  . insulin aspart  0-5 Units Subcutaneous QHS  . insulin aspart  0-9 Units Subcutaneous TID WC  . insulin aspart  8 Units Subcutaneous TID WC  . insulin glargine  40 Units Subcutaneous BID  . levothyroxine  100 mcg Oral QAC breakfast  . metoprolol tartrate  12.5 mg Oral BID  . multivitamin with minerals  1 tablet Oral Daily  . Ensure Max Protein  11 oz Oral TID WC  . tamsulosin  0.4 mg Oral Daily  . tiZANidine  4 mg Oral BID  . vitamin C  500 mg Oral BID  . zinc sulfate  220 mg Oral Daily   Continuous Infusions: .  ceFAZolin (ANCEF) IV    . lactated ringers  10 mL/hr at 11/09/18 0804   PRN Meds:.[START ON 11/10/2018] acetaminophen, HYDROcodone-acetaminophen, HYDROcodone-acetaminophen, meclizine, metoCLOPramide **OR** metoCLOPramide (REGLAN) injection, morphine injection, ondansetron **OR** ondansetron (ZOFRAN) IV, polyethylene glycol  DVT prophylaxis: SCD due to hematoma and blood loss anemia Code Status: Full code Family Communication: None at bedside. Updated patient's significant other over the phone. Appreciative.  Disposition Plan: Remains inpatient  Consultants:   We are  Procedures:   Right AKA revision and wound VAC placement  Antimicrobials:  Ancef 4/17--  Objective: Vitals:   11/09/18 1135 11/09/18 1150 11/09/18 1205 11/09/18 1219  BP: (!) 161/75  (!) 141/66 (!) 147/59  Pulse: 68  68 69  Resp: 13  10 13   Temp:  (!) 97 F (36.1 C)  98.3 F (36.8 C)  TempSrc:    Oral  SpO2: 100%  96% 100%  Weight:      Height:        Intake/Output Summary (Last 24 hours) at 11/09/2018 1550 Last data filed at 11/09/2018 1400 Gross per 24 hour  Intake 840 ml  Output 2650 ml  Net -1810 ml   Filed Weights   11/06/18 1200 11/09/18 0754  Weight: 128.5 kg 128.5 kg    Examination:  GENERAL: Appears well. No acute distress.  HEENT: MMM.  Vision and Hearing grossly intact.  NECK: Supple.  No JVD.  LUNGS:  No IWOB. Good air movement. CTAB.  HEART:  RRR. Heart sounds normal.  ABD: Bowel sounds present. Soft. Non tender.  EXT:  Wound VAC over right AKA.  No drainage or bleeding. SKIN: no apparent skin lesion.  NEURO: Awake, alert and oriented appropriately.  No gross deficit.  PSYCH: Calm. Normal affect.  Data Reviewed: I have independently reviewed following labs and imaging studies   CBC: Recent Labs  Lab 11/06/18 1848 11/07/18 0433 11/07/18 1253 11/08/18 0334 11/08/18 2007 11/09/18 0247  WBC 15.2* 13.3* 12.6* 11.4* 11.4* 9.6  NEUTROABS 12.0*  --   --   --   --   --   HGB 6.0* 6.5* 7.3* 7.4* 9.0* 8.0*  HCT 19.1* 20.7*  22.1* 22.6* 27.2* 25.7*  MCV 85.7 86.3 87.4 87.9 87.2 89.9  PLT 467* 465* 433* 458* 500* 469*   Basic Metabolic Panel: Recent Labs  Lab 11/06/18 1848 11/07/18 0433 11/08/18 0334 11/09/18 0247  NA 135 135 134* 140  K 4.3 4.3 4.5 4.4  CL 102 103 103 105  CO2 25 25 25 28   GLUCOSE 354* 245* 233* 112*  BUN 32* 34* 39* 35*  CREATININE 1.65* 1.42* 1.42* 1.27*  CALCIUM 8.2* 8.2* 8.2* 8.6*   GFR: Estimated Creatinine Clearance: 85.1 mL/min (A) (by C-G formula based on SCr of 1.27 mg/dL (H)). Liver Function Tests: Recent Labs  Lab 11/07/18 0433  AST 17  ALT 23  ALKPHOS 94  BILITOT 1.0  PROT 5.6*  ALBUMIN 1.6*   No results for input(s): LIPASE, AMYLASE in the last 168 hours. No results for input(s): AMMONIA in the last 168 hours. Coagulation Profile: Recent Labs  Lab 11/06/18 1848 11/07/18 0433 11/08/18 1305 11/09/18  0247  INR 1.5* 1.4* 1.3* 1.3*   Cardiac Enzymes: No results for input(s): CKTOTAL, CKMB, CKMBINDEX, TROPONINI in the last 168 hours. BNP (last 3 results) No results for input(s): PROBNP in the last 8760 hours. HbA1C: No results for input(s): HGBA1C in the last 72 hours. CBG: Recent Labs  Lab 11/08/18 2119 11/09/18 0648 11/09/18 1101 11/09/18 1128 11/09/18 1222  GLUCAP 163* 71 68* 104* 101*   Lipid Profile: No results for input(s): CHOL, HDL, LDLCALC, TRIG, CHOLHDL, LDLDIRECT in the last 72 hours. Thyroid Function Tests: No results for input(s): TSH, T4TOTAL, FREET4, T3FREE, THYROIDAB in the last 72 hours. Anemia Panel: No results for input(s): VITAMINB12, FOLATE, FERRITIN, TIBC, IRON, RETICCTPCT in the last 72 hours. Urine analysis:    Component Value Date/Time   COLORURINE YELLOW 09/03/2015 1400   APPEARANCEUR CLEAR 09/03/2015 1400   LABSPEC 1.020 09/03/2015 1400   PHURINE 5.0 09/03/2015 1400   GLUCOSEU 500 (A) 09/03/2015 1400   GLUCOSEU 500 (A) 11/14/2013 1016   HGBUR NEGATIVE 09/03/2015 1400   BILIRUBINUR NEGATIVE 09/03/2015 1400    KETONESUR NEGATIVE 09/03/2015 1400   PROTEINUR NEGATIVE 09/03/2015 1400   UROBILINOGEN 0.2 03/24/2015 1530   NITRITE NEGATIVE 09/03/2015 1400   LEUKOCYTESUR NEGATIVE 09/03/2015 1400   Sepsis Labs: Invalid input(s): PROCALCITONIN, LACTICIDVEN  Recent Results (from the past 240 hour(s))  Surgical PCR screen     Status: None   Collection Time: 11/06/18  5:11 PM  Result Value Ref Range Status   MRSA, PCR NEGATIVE NEGATIVE Final   Staphylococcus aureus NEGATIVE NEGATIVE Final    Comment: (NOTE) The Xpert SA Assay (FDA approved for NASAL specimens in patients 69 years of age and older), is one component of a comprehensive surveillance program. It is not intended to diagnose infection nor to guide or monitor treatment. Performed at [facility], [place] [address] [place], [place] [address]   Aerobic/Anaerobic Culture (surgical/deep wound)     Status: None (Preliminary result)   Collection Time: 11/09/18 10:14 AM  Result Value Ref Range Status   Specimen Description TISSUE  Final   Special Requests RIGHT KNEE  Final   Gram Stain   Final    FEW WBC PRESENT, PREDOMINANTLY PMN NO ORGANISMS SEEN Performed at [facility], [place] [address], [place],  [address]    Culture PENDING  Incomplete   Report Status PENDING  Incomplete      Radiology Studies: No results found.    [facility] Pager [phone]  If 7PM-7AM, please contact night-coverage [email or web address] Password [email or web address] 11/09/2018, 3:50 PM

## 2018-11-09 NOTE — Progress Notes (Signed)
Pt places self on CPAP dream station at night. Pt already on and resting comfortably when RT entered room. Rt will continue to monitor.

## 2018-11-09 NOTE — Op Note (Signed)
11/09/2018  11:04 AM  PATIENT:  Ethan Campbell    PRE-OPERATIVE DIAGNOSIS:  Hematoma Right Above Knee Amputation  POST-OPERATIVE DIAGNOSIS:  Same  PROCEDURE:  REVISION RIGHT ABOVE KNEE AMPUTATION Application of Praveena customizable and restore VAC.  SURGEON:  Newt Minion, MD  PHYSICIAN ASSISTANT:None ANESTHESIA:   General  PREOPERATIVE INDICATIONS:  Ethan Campbell is a  58 y.o. male with a diagnosis of Hematoma Right Above Knee Amputation who failed conservative measures and elected for surgical management.    The risks benefits and alternatives were discussed with the patient preoperatively including but not limited to the risks of infection, bleeding, nerve injury, cardiopulmonary complications, the need for revision surgery, among others, and the patient was willing to proceed.  OPERATIVE IMPLANTS: Praveena customizable and restore VAC.  @ENCIMAGES @  OPERATIVE FINDINGS: Massive hematoma right thigh possibly a gallon.  Hematoma was sent for cultures.  Patient's wound was debrided back to healthy viable muscle.  OPERATIVE PROCEDURE: Patient was brought the operating room and underwent a general anesthetic.  After adequate levels anesthesia were obtained patient's right lower extremity was prepped using ChloraPrep draped into a sterile field a timeout was called.  A fishmouth incision was just made proximal to the surgical margins.  There is a massive hematoma involving all 8 compartments.  The hematoma was decompressed and was sent for cultures x2.  Patient underwent resection of the distal 4 cm of the femur as well as resection of all muscle groups and resection of the hematoma and fascia.  There is no purulence.  The vascular bundle was suture ligated with 2-0 silk.  The wound was irrigated with normal saline patient received IV TXA and received topical TXA for hemostasis.  Patient's previous INR was 6.6 it is now 1.3.  After irrigation debridement of the hematoma with revision  of the amputation the incision was closed using 2-0 nylon and staples.  The incisional VAC customizable was placed along the surgical incision this was covered with the restore dressing.  This had a good suction fit.  Patient was extubated taken to PACU in stable condition.   DISCHARGE PLANNING:  Antibiotic duration: Continue antibiotics as per cultures.  Anticipate patient would need 4 weeks of antibiotics for the soft tissue.  Weightbearing: Not applicable.  Weightbearing as tolerated on the left.  Pain medication: Opioid pathway  Dressing care/ Wound VAC: Continue wound VAC for 1 week after discharge  Ambulatory devices: Walker  Discharge to: Anticipate discharge to skilled nursing.  Follow-up: In the office 1 week post operative.

## 2018-11-09 NOTE — Progress Notes (Signed)
Pt received from PACU. VSS. Wound vac @ 125 continuous on R stump, c/d/i, with no output at this time. Pt fiance called. Will continue to monitor.  Clyde Canterbury, RN

## 2018-11-09 NOTE — Progress Notes (Signed)
Hypoglycemic Event  CBG: 68  Treatment: D50 25 mL (12.5 gm)  Symptoms: None  Follow-up CBG: Time:1130 CBG Result:104  Possible Reasons for Event: Inadequate meal intake and Medication regimen: anesthesia  Comments/MD notified:dr oddonno    Signe Colt

## 2018-11-10 ENCOUNTER — Encounter (HOSPITAL_COMMUNITY): Payer: Self-pay | Admitting: Orthopedic Surgery

## 2018-11-10 DIAGNOSIS — E1143 Type 2 diabetes mellitus with diabetic autonomic (poly)neuropathy: Secondary | ICD-10-CM

## 2018-11-10 DIAGNOSIS — N183 Chronic kidney disease, stage 3 (moderate): Secondary | ICD-10-CM

## 2018-11-10 DIAGNOSIS — I82722 Chronic embolism and thrombosis of deep veins of left upper extremity: Secondary | ICD-10-CM

## 2018-11-10 DIAGNOSIS — N170 Acute kidney failure with tubular necrosis: Secondary | ICD-10-CM

## 2018-11-10 LAB — BASIC METABOLIC PANEL
Anion gap: 6 (ref 5–15)
BUN: 33 mg/dL — ABNORMAL HIGH (ref 6–20)
CO2: 27 mmol/L (ref 22–32)
Calcium: 8.4 mg/dL — ABNORMAL LOW (ref 8.9–10.3)
Chloride: 100 mmol/L (ref 98–111)
Creatinine, Ser: 1.53 mg/dL — ABNORMAL HIGH (ref 0.61–1.24)
GFR calc Af Amer: 58 mL/min — ABNORMAL LOW (ref >60)
GFR calc non Af Amer: 50 mL/min — ABNORMAL LOW (ref >60)
Glucose, Bld: 330 mg/dL — ABNORMAL HIGH (ref 70–99)
Potassium: 4.7 mmol/L (ref 3.5–5.1)
Sodium: 133 mmol/L — ABNORMAL LOW (ref 135–145)

## 2018-11-10 LAB — CBC
HCT: 22 % — ABNORMAL LOW (ref 39.0–52.0)
Hemoglobin: 7 g/dL — ABNORMAL LOW (ref 13.0–17.0)
MCH: 28.8 pg (ref 26.0–34.0)
MCHC: 31.8 g/dL (ref 30.0–36.0)
MCV: 90.5 fL (ref 80.0–100.0)
Platelets: 487 10*3/uL — ABNORMAL HIGH (ref 150–400)
RBC: 2.43 MIL/uL — ABNORMAL LOW (ref 4.22–5.81)
RDW: 17.1 % — ABNORMAL HIGH (ref 11.5–15.5)
WBC: 10.3 10*3/uL (ref 4.0–10.5)
nRBC: 0 % (ref 0.0–0.2)

## 2018-11-10 LAB — GLUCOSE, CAPILLARY
Glucose-Capillary: 244 mg/dL — ABNORMAL HIGH (ref 70–99)
Glucose-Capillary: 176 mg/dL — ABNORMAL HIGH (ref 70–99)
Glucose-Capillary: 199 mg/dL — ABNORMAL HIGH (ref 70–99)
Glucose-Capillary: 256 mg/dL — ABNORMAL HIGH (ref 70–99)

## 2018-11-10 LAB — PROTIME-INR
INR: 1.3 — ABNORMAL HIGH (ref 0.8–1.2)
Prothrombin Time: 15.5 seconds — ABNORMAL HIGH (ref 11.4–15.2)

## 2018-11-10 MED ORDER — LACTULOSE ENEMA
300.0000 mL | Freq: Once | ORAL | Status: DC
  Filled 2018-11-10: qty 300

## 2018-11-10 MED ORDER — INSULIN ASPART 100 UNIT/ML ~~LOC~~ SOLN
0.0000 [IU] | Freq: Every day | SUBCUTANEOUS | Status: DC
  Administered 2018-11-10: 3 [IU] via SUBCUTANEOUS

## 2018-11-10 MED ORDER — INSULIN ASPART 100 UNIT/ML ~~LOC~~ SOLN
0.0000 [IU] | Freq: Three times a day (TID) | SUBCUTANEOUS | Status: DC
  Administered 2018-11-10: 09:00:00 5 [IU] via SUBCUTANEOUS
  Administered 2018-11-10 (×2): 3 [IU] via SUBCUTANEOUS
  Administered 2018-11-11 (×3): 2 [IU] via SUBCUTANEOUS
  Administered 2018-11-12: 12:00:00 3 [IU] via SUBCUTANEOUS

## 2018-11-10 NOTE — Progress Notes (Signed)
Patient ID: Ethan Campbell, male   DOB: 04/27/1961, 58 y.o.   MRN: [id number] Postoperative day 1 revision right above-the-knee amputation.  Patient had a massive hematoma.  This was sent for cultures.  There is currently no drainage in the wound VAC canister his hemoglobin is 7.3 INR 1.3.  Would continue to hold anticoagulation therapy.

## 2018-11-10 NOTE — Evaluation (Signed)
Physical Therapy Evaluation Patient Details Name: Ethan Campbell MRN: [id number] DOB: 1961-04-04 Today's Date: 11/10/2018   History of Present Illness  58 y.o. male admitted on 11/06/2018 with hematoma on his AKA , recent R BKA that was gangrenous and had to be converted to an AKA on 10/26/18. Pt with significant PMH of R BKA on 10/05/18, DM2, seizure, sick sinus syndrome, obesity, essential HTN, CKD stage 3, L TKA.     Clinical Impression  Patient required significant assist to transfer sit to stand. He is very motivated to go home but he was unable to transfer to a chair with therapy. If he can at least transfer to a wheelchair he will have enough assist at home. He would like to work on transfers over the next few days. If he can not transfer then he may need to go to rehab. Acute therapy will continue to work with the patient.     Follow Up Recommendations Home health PT;Supervision/Assistance - 24 hour;SNF    Equipment Recommendations  None recommended by PT    Recommendations for Other Services       Precautions / Restrictions Precautions Precautions: Fall Precaution Comments: wound vac Restrictions Weight Bearing Restrictions: Yes RLE Weight Bearing: Non weight bearing      Mobility  Bed Mobility Overal bed mobility: Modified Independent Bed Mobility: Supine to Sit     Supine to sit: Min assist     General bed mobility comments: Min hand held assist to pull to sitting EOB with left hand, right hand using bed rail and HOB elevated ~ 30 degrees. Once sitting the patient needed no assist. He was left sitting at the edge of the bed. Nursing notified.   Transfers Overall transfer level: Needs assistance Equipment used: None Transfers: Sit to/from Stand Sit to Stand: Max assist;Total assist         General transfer comment: Attmepted to stand 2x. On the first attmwept he was unable to stand despite total assist; On the second trial he was able to stand with MAx a. He  remained standing for 15 seconds. He was unable to pivot. In his next treatment he will require at least +2 to transfer to the cahir  Ambulation/Gait                Stairs            Wheelchair Mobility    Modified Rankin (Stroke Patients Only)       Balance Overall balance assessment: Needs assistance Sitting-balance support: Feet supported;Bilateral upper extremity supported Sitting balance-Leahy Scale: Good     Standing balance support: Bilateral upper extremity supported Standing balance-Leahy Scale: Poor                               Pertinent Vitals/Pain Pain Assessment: No/denies pain    Home Living Family/patient expects to be discharged to:: Private residence Living Arrangements: Spouse/significant other Available Help at Discharge: Family;Available 24 hours/day Type of Home: Mobile home Home Access: Ramped entrance     Home Layout: One level Home Equipment: [place] - 4 wheels;Bedside commode;Adaptive equipment;Crutches Additional Comments: Has a hospital bed but patients girlfrined reports there is a dip in it that makes it hard to get out of.     Prior Function Level of Independence: Needs assistance   Gait / Transfers Assistance Needed: did not have much of a chance to start rehab at SNF before he transferred back to  [facility] for repeat surgery.     Comments: ambulates with crutches. Patient reports he has used crutches for years.      Hand Dominance   Dominant Hand: Right    Extremity/Trunk Assessment   Upper Extremity Assessment Upper Extremity Assessment: Overall WFL for tasks assessed    Lower Extremity Assessment Lower Extremity Assessment: Generalized weakness RLE Deficits / Details: right post op AKA (high),        Communication   Communication: No difficulties  Cognition Arousal/Alertness: Awake/alert Behavior During Therapy: WFL for tasks assessed/performed Overall Cognitive Status: Within Functional Limits  for tasks assessed                                        General Comments      Exercises Amputee Exercises Quad Sets: (reviewd ) Gluteal Sets: (reviewed) Knee Extension: (reviewed)   Assessment/Plan    PT Assessment Patient needs continued PT services  PT Problem List Decreased strength;Decreased activity tolerance;Decreased range of motion;Decreased balance;Decreased mobility;Decreased knowledge of use of DME;Decreased knowledge of precautions;Obesity;Pain       PT Treatment Interventions DME instruction;Gait training;Functional mobility training;Therapeutic activities;Therapeutic exercise;Balance training;Patient/family education    PT Goals (Current goals can be found in the Care Plan section)  Acute Rehab PT Goals Patient Stated Goal: go home soon PT Goal Formulation: With patient Time For Goal Achievement: 11/12/18 Potential to Achieve Goals: Good    Frequency Min 3X/week   Barriers to discharge Decreased caregiver support at this time nedds significnat assist to transfer    Co-evaluation               AM-PAC PT "6 Clicks" Mobility  Outcome Measure Help needed turning from your back to your side while in a flat bed without using bedrails?: A Little Help needed moving from lying on your back to sitting on the side of a flat bed without using bedrails?: A Little Help needed moving to and from a bed to a chair (including a wheelchair)?: Total Help needed standing up from a chair using your arms (e.g., wheelchair or bedside chair)?: Total Help needed to walk in hospital room?: Total Help needed climbing 3-5 steps with a railing? : Total 6 Click Score: 10    End of Session Equipment Utilized During Treatment: Gait belt Activity Tolerance: Patient limited by fatigue Patient left: in bed;with call bell/phone within reach(sitting at thte edge of the bed ) Nurse Communication: Mobility status PT Visit Diagnosis: Muscle weakness (generalized)  (M62.81);Difficulty in walking, not elsewhere classified (R26.2);Pain Pain - Right/Left: Right Pain - part of body: Leg    Time: 3235-5732 PT Time Calculation (min) (ACUTE ONLY): 20 min   Charges:   PT Evaluation $PT Eval Moderate Complexity: 1 Mod            Carney Living PT DPT  11/10/2018, 1:40 PM

## 2018-11-10 NOTE — Progress Notes (Signed)
PROGRESS NOTE  GEVORG BRUM GBT:[id number] DOB: 1960-11-04 DOA: 11/06/2018 PCP: Arsenio Katz, NP   LOS: 4 days   Brief Narrative / Interim history: 58 year old male with history of recent right AKA about 2 weeks ago, hypertension, hyperlipidemia, seizure disorder, depression, hypothyroidism and coronary peripheral vascular disease presenting with right discomfort swelling and drainage.  Initially presented to [facility], where he was found to be anemic to 6.6 (previously 7.7 on 4/9), and transferred here for further management.  Received 1 unit in route. Hemodynamically stable on arrival.  Patient had revision of right AKA on 4/17 by Dr. Sharol Given.    Subjective: No major events overnight or this morning.  He has no complaints this morning.  Pain fairly controlled.  Denies chest pain, dyspnea or abdominal pain.  Assessment & Plan: Principal Problem:   Hematoma of amputation stump of right lower extremity (HCC) Active Problems:   Type II diabetes mellitus, uncontrolled (HCC)   Morbid obesity (HCC)   OBSTRUCTIVE SLEEP APNEA   Essential hypertension, benign   Acute renal failure superimposed on stage 3 chronic kidney disease (HCC)   Diabetic autonomic neuropathy associated with type 2 diabetes mellitus (HCC)   Below-knee amputation of right lower extremity (HCC)   Dehiscence of amputation stump (HCC)   Hematoma of amputation stump of left lower extremity (HCC)   Anticoagulants causing adverse effect in therapeutic use   S/P AKA (above knee amputation) unilateral, left (HCC)  Acute blood loss anemia likely due to bleeding from right AKA and anticoagulation.  Reportedly INR was 6.6 prior to arrival at [facility] per admitting provider.  Received 3 units of blood so far.  Hemoglobin 7.3.  INR 1.3. -Right AKA revision and wound vac by Dr. Sharol Given 4/17. -Orthopedic surgery recommendations  -Weightbearing as tolerated on the left  -Ancef 4/17-- probably for 4 weeks  -Follow tissue cultures  -Continue to hold anticoagulation  -Wound VAC for 1 week after discharge and follow-up in 1 wk  -Anticipate SNF  -SCD to left leg for VTE prophylaxis. -Check H&H in the morning -Continue p.o. iron supplementation -PT/OT  Poorly controlled IDDM-2 with renal and vascular complication and hyperglycemia.  Not in DKA.  On insulin and Victoza at home.  CBG elevated this morning -Lantus 40 units twice daily -Increase SSI from low to moderate -CBG monitoring -Continue statin  History of sick sinus syndrome-status post pacemaker placement in 2019. -Continue home metoprolol  History of DVT/supratherapeutic INR: Reportedly 6.6 prior to transfer.  Received FFP and vitamin K.  INR now 1.3.  Warfarin on hold due to hematoma and anemia. -Continue monitoring  History of left upper extremity DVT-likely provoked after pacemaker placement for SSS -On warfarin over a year now -Warfarin on hold due to the above.   Chronic diastolic heart failure: Euvolemic. -Continue home Lasix -Daily weight, intake output and renal function.  Hypertension: Normotensive.  -Continue home blood pressure medications -Discontinue midodrine-no longer on this  OSA: Uses BiPAP at home. -Continue nightly CPAP  CKD 3: Stable  History of seizure disorder/depression stable -Continue home medications  Hypothyroidism: Stable -Continue home medications  Morbid obesity: BMI 41.8 -Needs lifestyle change to lose weight  Scheduled Meds: . acetaminophen  500 mg Oral Q6H  . atorvastatin  80 mg Oral q1800  . docusate sodium  100 mg Oral BID  . DULoxetine  60 mg Oral Daily  . fenofibrate  160 mg Oral Daily  . ferrous sulfate  325 mg Oral TID WC  . FLUoxetine  20 mg  Oral Daily  . furosemide  40 mg Oral Daily  . gabapentin  300 mg Oral BID  . insulin aspart  0-15 Units Subcutaneous TID WC  . insulin aspart  0-5 Units Subcutaneous QHS  . insulin aspart  8 Units Subcutaneous TID WC  . insulin glargine  40 Units  Subcutaneous BID  . levothyroxine  100 mcg Oral QAC breakfast  . metoprolol tartrate  12.5 mg Oral BID  . multivitamin with minerals  1 tablet Oral Daily  . Ensure Max Protein  11 oz Oral TID WC  . tamsulosin  0.4 mg Oral Daily  . tiZANidine  4 mg Oral BID  . vitamin C  500 mg Oral BID  . zinc sulfate  220 mg Oral Daily   Continuous Infusions: .  ceFAZolin (ANCEF) IV 1 g (11/10/18 0857)  . lactated ringers 10 mL/hr at 11/09/18 0804   PRN Meds:.acetaminophen, HYDROcodone-acetaminophen, HYDROcodone-acetaminophen, meclizine, metoCLOPramide **OR** metoCLOPramide (REGLAN) injection, morphine injection, ondansetron **OR** ondansetron (ZOFRAN) IV, polyethylene glycol  DVT prophylaxis: SCD due to hematoma and blood loss anemia Code Status: Full code Family Communication: None at bedside. Updated patient's significant other over the phone on 4/17. Appreciative.   Disposition Plan: Remains inpatient pending PT/OT recommendations.  Likely SNF.  Consultants:   We are  Procedures:   Right AKA revision and wound VAC placement  Antimicrobials:  Ancef 4/17--  Objective: Vitals:   11/09/18 2226 11/10/18 0253 11/10/18 0906 11/10/18 1048  BP:  140/70 (!) 123/59 139/67  Pulse:   78 78  Resp:  14 12   Temp:  98.4 F (36.9 C) 98.2 F (36.8 C)   TempSrc:  Oral Oral   SpO2: 100% 98% 98%   Weight:      Height:        Intake/Output Summary (Last 24 hours) at 11/10/2018 1126 Last data filed at 11/10/2018 0612 Gross per 24 hour  Intake 3347.97 ml  Output 2200 ml  Net 1147.97 ml   Filed Weights   11/06/18 1200 11/09/18 0754  Weight: 128.5 kg 128.5 kg    Examination: GENERAL: Appears well. No acute distress.  HEENT: MMM.  Vision and Hearing grossly intact.  NECK: Supple.  No JVD.  LUNGS:  No IWOB. Good air movement. CTAB.  HEART:  RRR. Heart sounds normal.  ABD: Bowel sounds present. Soft. Non tender.  EXT: Wound VAC over right AKA.  No drainage or bleeding. SKIN: no apparent  skin lesion.  NEURO: Awake, alert and oriented appropriately.  No gross deficit.  PSYCH: Calm. Normal affect.  Data Reviewed: I have independently reviewed following labs and imaging studies   CBC: Recent Labs  Lab 11/06/18 1848  11/07/18 1253 11/08/18 0334 11/08/18 2007 11/09/18 0247 11/10/18 0300  WBC 15.2*   < > 12.6* 11.4* 11.4* 9.6 10.3  NEUTROABS 12.0*  --   --   --   --   --   --   HGB 6.0*   < > 7.3* 7.4* 9.0* 8.0* 7.0*  HCT 19.1*   < > 22.1* 22.6* 27.2* 25.7* 22.0*  MCV 85.7   < > 87.4 87.9 87.2 89.9 90.5  PLT 467*   < > 433* 458* 500* 487* 487*   < > = values in this interval not displayed.   Basic Metabolic Panel: Recent Labs  Lab 11/06/18 1848 11/07/18 0433 11/08/18 0334 11/09/18 0247 11/10/18 0300  NA 135 135 134* 140 133*  K 4.3 4.3 4.5 4.4 4.7  CL 102 103 103 105  100  CO2 25 25 25 28 27   GLUCOSE 354* 245* 233* 112* 330*  BUN 32* 34* 39* 35* 33*  CREATININE 1.65* 1.42* 1.42* 1.27* 1.53*  CALCIUM 8.2* 8.2* 8.2* 8.6* 8.4*   GFR: Estimated Creatinine Clearance: 70.7 mL/min (A) (by C-G formula based on SCr of 1.53 mg/dL (H)). Liver Function Tests: Recent Labs  Lab 11/07/18 0433  AST 17  ALT 23  ALKPHOS 94  BILITOT 1.0  PROT 5.6*  ALBUMIN 1.6*   No results for input(s): LIPASE, AMYLASE in the last 168 hours. No results for input(s): AMMONIA in the last 168 hours. Coagulation Profile: Recent Labs  Lab 11/06/18 1848 11/07/18 0433 11/08/18 1305 11/09/18 0247 11/10/18 0300  INR 1.5* 1.4* 1.3* 1.3* 1.3*   Cardiac Enzymes: No results for input(s): CKTOTAL, CKMB, CKMBINDEX, TROPONINI in the last 168 hours. BNP (last 3 results) No results for input(s): PROBNP in the last 8760 hours. HbA1C: No results for input(s): HGBA1C in the last 72 hours. CBG: Recent Labs  Lab 11/09/18 1128 11/09/18 1222 11/09/18 1614 11/09/18 2142 11/10/18 0609  GLUCAP 104* 101* 259* 388* 244*   Lipid Profile: No results for input(s): CHOL, HDL, LDLCALC, TRIG,  CHOLHDL, LDLDIRECT in the last 72 hours. Thyroid Function Tests: No results for input(s): TSH, T4TOTAL, FREET4, T3FREE, THYROIDAB in the last 72 hours. Anemia Panel: No results for input(s): VITAMINB12, FOLATE, FERRITIN, TIBC, IRON, RETICCTPCT in the last 72 hours. Urine analysis:    Component Value Date/Time   COLORURINE YELLOW 09/03/2015 1400   APPEARANCEUR CLEAR 09/03/2015 1400   LABSPEC 1.020 09/03/2015 1400   PHURINE 5.0 09/03/2015 1400   GLUCOSEU 500 (A) 09/03/2015 1400   GLUCOSEU 500 (A) 11/14/2013 1016   HGBUR NEGATIVE 09/03/2015 1400   BILIRUBINUR NEGATIVE 09/03/2015 1400   KETONESUR NEGATIVE 09/03/2015 1400   PROTEINUR NEGATIVE 09/03/2015 1400   UROBILINOGEN 0.2 03/24/2015 1530   NITRITE NEGATIVE 09/03/2015 1400   LEUKOCYTESUR NEGATIVE 09/03/2015 1400   Sepsis Labs: Invalid input(s): PROCALCITONIN, LACTICIDVEN  Recent Results (from the past 240 hour(s))  Surgical PCR screen     Status: None   Collection Time: 11/06/18  5:11 PM  Result Value Ref Range Status   MRSA, PCR NEGATIVE NEGATIVE Final   Staphylococcus aureus NEGATIVE NEGATIVE Final    Comment: (NOTE) The Xpert SA Assay (FDA approved for NASAL specimens in patients 31 years of age and older), is one component of a comprehensive surveillance program. It is not intended to diagnose infection nor to guide or monitor treatment. Performed at [facility], [place] [address], [place], [place] [address]   Aerobic/Anaerobic Culture (surgical/deep wound)     Status: None (Preliminary result)   Collection Time: 11/09/18 10:14 AM  Result Value Ref Range Status   Specimen Description TISSUE  Final   Special Requests RIGHT KNEE  Final   Gram Stain   Final    FEW WBC PRESENT, PREDOMINANTLY PMN NO ORGANISMS SEEN Performed at [facility], [place] [address] [place], [place] [address]    Culture PENDING  Incomplete   Report Status PENDING  Incomplete      Radiology Studies: No results found.   Taevion Sikora T.  [facility] Pager [phone]  If 7PM-7AM, please contact night-coverage [email or web address] Password [facility] 11/10/2018, 11:26 AM

## 2018-11-10 NOTE — Progress Notes (Signed)
RT went to check on pt on CPAP dream station and pt had placed himself on the dream station. Pt is on auto titrate with high 20 low 8 with no oxygen bled into the system. RT will continue to monitor.

## 2018-11-11 LAB — CBC
HCT: 21.5 % — ABNORMAL LOW (ref 39.0–52.0)
Hemoglobin: 7 g/dL — ABNORMAL LOW (ref 13.0–17.0)
MCH: 29.9 pg (ref 26.0–34.0)
MCHC: 32.6 g/dL (ref 30.0–36.0)
MCV: 91.9 fL (ref 80.0–100.0)
Platelets: 498 10*3/uL — ABNORMAL HIGH (ref 150–400)
RBC: 2.34 MIL/uL — ABNORMAL LOW (ref 4.22–5.81)
RDW: 17.6 % — ABNORMAL HIGH (ref 11.5–15.5)
WBC: 10.6 10*3/uL — ABNORMAL HIGH (ref 4.0–10.5)
nRBC: 0 % (ref 0.0–0.2)

## 2018-11-11 LAB — GLUCOSE, CAPILLARY
Glucose-Capillary: 137 mg/dL — ABNORMAL HIGH (ref 70–99)
Glucose-Capillary: 150 mg/dL — ABNORMAL HIGH (ref 70–99)
Glucose-Capillary: 126 mg/dL — ABNORMAL HIGH (ref 70–99)
Glucose-Capillary: 164 mg/dL — ABNORMAL HIGH (ref 70–99)

## 2018-11-11 LAB — BASIC METABOLIC PANEL
Anion gap: 7 (ref 5–15)
BUN: 39 mg/dL — ABNORMAL HIGH (ref 6–20)
CO2: 26 mmol/L (ref 22–32)
Calcium: 8.5 mg/dL — ABNORMAL LOW (ref 8.9–10.3)
Chloride: 103 mmol/L (ref 98–111)
Creatinine, Ser: 1.4 mg/dL — ABNORMAL HIGH (ref 0.61–1.24)
GFR calc Af Amer: >60 mL/min (ref >60)
GFR calc non Af Amer: 55 mL/min — ABNORMAL LOW (ref >60)
Glucose, Bld: 234 mg/dL — ABNORMAL HIGH (ref 70–99)
Potassium: 4.4 mmol/L (ref 3.5–5.1)
Sodium: 136 mmol/L (ref 135–145)

## 2018-11-11 LAB — PROTIME-INR
INR: 1.2 (ref 0.8–1.2)
Prothrombin Time: 15.1 seconds (ref 11.4–15.2)

## 2018-11-11 MED ORDER — CEFAZOLIN SODIUM-DEXTROSE 1-4 GM/50ML-% IV SOLN
1.0000 g | Freq: Three times a day (TID) | INTRAVENOUS | Status: DC
  Administered 2018-11-11 – 2018-11-12 (×3): 1 g via INTRAVENOUS
  Filled 2018-11-11 (×6): qty 50

## 2018-11-11 MED ORDER — ASPIRIN EC 81 MG PO TBEC
81.0000 mg | DELAYED_RELEASE_TABLET | Freq: Two times a day (BID) | ORAL | Status: DC
  Administered 2018-11-11 – 2018-11-12 (×3): 81 mg via ORAL
  Filled 2018-11-11 (×3): qty 1

## 2018-11-11 NOTE — Progress Notes (Signed)
Physical Therapy Treatment Patient Details Name: Ethan Campbell MRN: [id number] DOB: 1961-04-13 Today's Date: 11/11/2018    History of Present Illness 58 y.o. male admitted on 11/06/2018 with hematoma on his AKA , recent R BKA that was gangrenous and had to be converted to an AKA on 10/26/18. Pt with significant PMH of R BKA on 10/05/18, DM2, seizure, sick sinus syndrome, obesity, essential HTN, CKD stage 3, L TKA.       PT Comments    Patient seen again to progress safety with transfers in preparation for return home. Pt performed 5x sit to stands from neutral height bed without physical assistance. No concerns given patients support at home for HHPT.   Follow Up Recommendations  Home health PT;Supervision/Assistance - 24 hour;SNF     Equipment Recommendations  None recommended by PT    Recommendations for Other Services       Precautions / Restrictions Precautions Precautions: Fall Precaution Comments: wound vac Restrictions Weight Bearing Restrictions: Yes RLE Weight Bearing: Non weight bearing    Mobility  Bed Mobility Overal bed mobility: Modified Independent Bed Mobility: Supine to Sit     Supine to sit: Min assist     General bed mobility comments: Min hand held assist to pull to sitting EOB with left hand, right hand using bed rail and HOB elevated ~ 30 degrees. Once sitting the patient needed no assist. He was left sitting at the edge of the bed. Nursing notified.   Transfers Overall transfer level: Needs assistance Equipment used: None Transfers: Sit to/from Stand Sit to Stand: Min guard         General transfer comment: 5x sit to stand with superivsion and rest breaks.  Ambulation/Gait                 Stairs             Wheelchair Mobility    Modified Rankin (Stroke Patients Only)       Balance Overall balance assessment: Needs assistance Sitting-balance support: Feet supported;Bilateral upper extremity supported Sitting  balance-Leahy Scale: Good     Standing balance support: Bilateral upper extremity supported Standing balance-Leahy Scale: Poor                              Cognition Arousal/Alertness: Awake/alert Behavior During Therapy: WFL for tasks assessed/performed Overall Cognitive Status: Within Functional Limits for tasks assessed                                        Exercises Amputee Exercises Quad Sets: (reviewd ) Gluteal Sets: (reviewed) Knee Extension: (reviewed)    General Comments        Pertinent Vitals/Pain      Home Living Family/patient expects to be discharged to:: Private residence Living Arrangements: Spouse/significant other Available Help at Discharge: Family;Available 24 hours/day Type of Home: Mobile home Home Access: Ramped entrance   Home Layout: One level Home Equipment: Crozier - 4 wheels;Bedside commode;Adaptive equipment;Crutches;Walker - standard;Wheelchair - Education administrator (comment)(sliding board, drop arm commode  )      Prior Function Level of Independence: Needs assistance  Gait / Transfers Assistance Needed: used crutches and wc for mobility at home  ADL's / Homemaking Assistance Needed: independent ADLs, sponge bathing/dressing      PT Goals (current goals can now be found in the care plan section)  Acute Rehab PT Goals Patient Stated Goal: go home soon PT Goal Formulation: With patient Time For Goal Achievement: 11/12/18 Potential to Achieve Goals: Good    Frequency    Min 3X/week      PT Plan      Co-evaluation              AM-PAC PT "6 Clicks" Mobility   Outcome Measure  Help needed turning from your back to your side while in a flat bed without using bedrails?: A Little Help needed moving from lying on your back to sitting on the side of a flat bed without using bedrails?: A Little Help needed moving to and from a bed to a chair (including a wheelchair)?: A Lot Help needed standing up from a  chair using your arms (e.g., wheelchair or bedside chair)?: Total Help needed to walk in hospital room?: Total Help needed climbing 3-5 steps with a railing? : Total 6 Click Score: 11    End of Session Equipment Utilized During Treatment: Gait belt Activity Tolerance: Patient limited by fatigue Patient left: in bed;with call bell/phone within reach(sitting at thte edge of the bed ) Nurse Communication: Mobility status PT Visit Diagnosis: Muscle weakness (generalized) (M62.81);Difficulty in walking, not elsewhere classified (R26.2);Pain Pain - Right/Left: Right Pain - part of body: Leg     Time: 1410-1420 PT Time Calculation (min) (ACUTE ONLY): 10 min  Charges:  $Therapeutic Activity: 8-22 mins                     Reinaldo Berber, PT, DPT Acute Rehabilitation Services Pager: [phone] Office: [phone]     Reinaldo Berber 11/11/2018, 2:00 PM

## 2018-11-11 NOTE — Progress Notes (Signed)
Pt had big BM before getting lactulose enema, pt stated he didn't needed it anymore. Prn miralax administered. Will continue to monitor.

## 2018-11-11 NOTE — Progress Notes (Signed)
Physical Therapy Treatment Patient Details Name: Ethan Campbell MRN: [id number] DOB: 06-03-1961 Today's Date: 11/11/2018    History of Present Illness 58 y.o. male admitted on 11/06/2018 with hematoma on his AKA , recent R BKA that was gangrenous and had to be converted to an AKA on 10/26/18. Pt with significant PMH of R BKA on 10/05/18, DM2, seizure, sick sinus syndrome, obesity, essential HTN, CKD stage 3, L TKA.       PT Comments    Patient with much improvement from prior PT visit, now transferring to bedside chair with +1 assistance, min A level at most today. Patient states SO at home will be able to provide physical assistance if he were to leave today.      Follow Up Recommendations  Home health PT;Supervision/Assistance - 24 hour;SNF     Equipment Recommendations  None recommended by PT    Recommendations for Other Services       Precautions / Restrictions Precautions Precautions: Fall Precaution Comments: wound vac Restrictions Weight Bearing Restrictions: Yes RLE Weight Bearing: Non weight bearing    Mobility  Bed Mobility Overal bed mobility: Modified Independent Bed Mobility: Supine to Sit     Supine to sit: Min assist     General bed mobility comments: Min hand held assist to pull to sitting EOB with left hand, right hand using bed rail and HOB elevated ~ 30 degrees. Once sitting the patient needed no assist. He was left sitting at the edge of the bed. Nursing notified.   Transfers Overall transfer level: Needs assistance Equipment used: None Transfers: Sit to/from Stand Sit to Stand: Min guard         General transfer comment: standing today with Min A from neutral surface  Ambulation/Gait                 Stairs             Wheelchair Mobility    Modified Rankin (Stroke Patients Only)       Balance Overall balance assessment: Needs assistance Sitting-balance support: Feet supported;Bilateral upper extremity  supported Sitting balance-Leahy Scale: Good     Standing balance support: Bilateral upper extremity supported Standing balance-Leahy Scale: Poor                              Cognition Arousal/Alertness: Awake/alert Behavior During Therapy: WFL for tasks assessed/performed Overall Cognitive Status: Within Functional Limits for tasks assessed                                        Exercises Amputee Exercises Quad Sets: (reviewd ) Gluteal Sets: (reviewed) Knee Extension: (reviewed)    General Comments        Pertinent Vitals/Pain      Home Living                      Prior Function            PT Goals (current goals can now be found in the care plan section) Acute Rehab PT Goals Patient Stated Goal: go home soon PT Goal Formulation: With patient Time For Goal Achievement: 11/12/18 Potential to Achieve Goals: Good    Frequency    Min 3X/week      PT Plan      Co-evaluation  AM-PAC PT "6 Clicks" Mobility   Outcome Measure  Help needed turning from your back to your side while in a flat bed without using bedrails?: A Little Help needed moving from lying on your back to sitting on the side of a flat bed without using bedrails?: A Little Help needed moving to and from a bed to a chair (including a wheelchair)?: A Lot Help needed standing up from a chair using your arms (e.g., wheelchair or bedside chair)?: Total Help needed to walk in hospital room?: Total Help needed climbing 3-5 steps with a railing? : Total 6 Click Score: 11    End of Session Equipment Utilized During Treatment: Gait belt Activity Tolerance: Patient limited by fatigue Patient left: in bed;with call bell/phone within reach(sitting at thte edge of the bed ) Nurse Communication: Mobility status PT Visit Diagnosis: Muscle weakness (generalized) (M62.81);Difficulty in walking, not elsewhere classified (R26.2);Pain Pain - Right/Left:  Right Pain - part of body: Leg     Time: 0915-0926 PT Time Calculation (min) (ACUTE ONLY): 11 min  Charges:  $Therapeutic Activity: 8-22 mins                     Reinaldo Berber, PT, DPT Acute Rehabilitation Services Pager: [phone] Office: [phone]     Reinaldo Berber 11/11/2018, 9:16 AM

## 2018-11-11 NOTE — Evaluation (Signed)
Occupational Therapy Evaluation Patient Details Name: Ethan Campbell MRN: [id number] DOB: 07-04-1961 Today's Date: 11/11/2018    History of Present Illness 58 y.o. male admitted on 11/06/2018 with hematoma on his AKA , recent R BKA that was gangrenous and had to be converted to an AKA on 10/26/18. Pt with significant PMH of R BKA on 10/05/18, DM2, seizure, sick sinus syndrome, obesity, essential HTN, CKD stage 3, L TKA.      Clinical Impression   PTA patient reports independent with ADLs from bed level (sponge bathing), mobility at home using crutches, RW and wc.  Patient admitted for above and limited by problem list below, including pain, impaired balance, and decreased activity tolerance. Able to complete UB ADLs seated with setup assist, LB ADLs from bed level with min assist (usually uses reacher for dressing).  Pt attempts sit to stand from EOB, unsuccessful and reporting increased fatigue from recent [facility] transfer with nursing (requiring use of stedy to return to bed)--discussed plan for home if unable to stand due to fatigue, and patient reports plan to use sliding board. Pt reports no concerns for dc home, reports spouse will provide assist as needed.  Will continue to follow while admitted, recommend continued OT services while admitted after and after dc at [facility] in order to optimize independence and safety with ADLs/mobility.     Follow Up Recommendations  Home health OT;Supervision/Assistance - 24 hour    Equipment Recommendations  None recommended by OT    Recommendations for Other Services       Precautions / Restrictions Precautions Precautions: Fall Precaution Comments: wound vac Restrictions Weight Bearing Restrictions: Yes RLE Weight Bearing: Non weight bearing      Mobility Bed Mobility Overal bed mobility: Needs Assistance Bed Mobility: Supine to Sit;Sit to Supine     Supine to sit: Supervision Sit to supine: Supervision   General bed mobility comments:  supervision for safety and balance, no assist required  Transfers Overall transfer level: Needs assistance Equipment used: Rolling walker (2 wheeled) Transfers: Sit to/from Stand Sit to Stand: Min guard         General transfer comment: attempted sit to stand from EOB, with pt reporting increased fatigue and unable to achieve full stand; discussed plan for home with fatigue and reports will use sliding board for transfers as needed    Balance Overall balance assessment: Needs assistance Sitting-balance support: Feet supported;Bilateral upper extremity supported Sitting balance-Leahy Scale: Good     Standing balance support: Bilateral upper extremity supported Standing balance-Leahy Scale: Poor                             ADL either performed or assessed with clinical judgement   ADL Overall ADL's : Needs assistance/impaired     Grooming: Set up;Sitting   Upper Body Bathing: Set up;Sitting   Lower Body Bathing: Minimal assistance;Bed level   Upper Body Dressing : Set up;Sitting   Lower Body Dressing: Minimal assistance;Bed level     Toilet Transfer Details (indicate cue type and reason): deferred, pt declined as just returned to bed from [place] ADL Comments: Pt reports plan to resume bathing/dressing from bed level, min assist today for LB ADls as he typically uses reacher.  Patient reports transfer from 3:1 commode to with nursing assist, requires use of stedy to return to bed due to weakness of L LE.  Vision         Perception     Praxis      Pertinent Vitals/Pain Pain Assessment: Faces Faces Pain Scale: Hurts even more Pain Location: R residual limb Pain Descriptors / Indicators: Sore Pain Intervention(s): Monitored during session;Repositioned     Hand Dominance Right   Extremity/Trunk Assessment Upper Extremity Assessment Upper Extremity Assessment: Overall WFL for tasks assessed   Lower Extremity  Assessment Lower Extremity Assessment: Defer to PT evaluation       Communication Communication Communication: No difficulties   Cognition Arousal/Alertness: Awake/alert Behavior During Therapy: WFL for tasks assessed/performed Overall Cognitive Status: Within Functional Limits for tasks assessed                                     General Comments       Exercises Amputee Exercises Quad Sets: (reviewd ) Gluteal Sets: (reviewed) Knee Extension: (reviewed)   Shoulder Instructions      Home Living Family/patient expects to be discharged to:: Private residence Living Arrangements: Spouse/significant other Available Help at Discharge: Family;Available 24 hours/day Type of Home: Mobile home Home Access: Ramped entrance     Home Layout: One level     Bathroom Shower/Tub: Teacher, early years/pre: Standard Bathroom Accessibility: Yes   Home Equipment: Walker - 4 wheels;Bedside commode;Adaptive equipment;Crutches;Walker - standard;Wheelchair - Education administrator (comment)(sliding board, drop arm BSC ) Adaptive Equipment: Reacher;Sock aid        Prior Functioning/Environment Level of Independence: Needs assistance  Gait / Transfers Assistance Needed: used crutches and wc for mobility at home  ADL's / Homemaking Assistance Needed: independent ADLs, sponge bathing/dressing             OT Problem List: Decreased strength;Decreased activity tolerance;Impaired balance (sitting and/or standing);Decreased knowledge of use of DME or AE;Decreased knowledge of precautions;Pain      OT Treatment/Interventions: Self-care/ADL training;Therapeutic exercise;DME and/or AE instruction;Therapeutic activities;Balance training;Patient/family education    OT Goals(Current goals can be found in the care plan section) Acute Rehab OT Goals Patient Stated Goal: go home soon OT Goal Formulation: With patient Time For Goal Achievement: 11/25/18 Potential to Achieve Goals:  Good  OT Frequency: Min 2X/week   Barriers to D/C:            Co-evaluation              AM-PAC OT "6 Clicks" Daily Activity     Outcome Measure Help from another person eating meals?: None Help from another person taking care of personal grooming?: None Help from another person toileting, which includes using toliet, bedpan, or urinal?: A Little Help from another person bathing (including washing, rinsing, drying)?: A Little Help from another person to put on and taking off regular upper body clothing?: None Help from another person to put on and taking off regular lower body clothing?: A Little 6 Click Score: 21   End of Session Equipment Utilized During Treatment: Gait belt Nurse Communication: Mobility status  Activity Tolerance: Patient tolerated treatment well Patient left: in bed;with call bell/phone within reach;with bed alarm set  OT Visit Diagnosis: Other abnormalities of gait and mobility (R26.89);Muscle weakness (generalized) (M62.81);Pain Pain - Right/Left: Right Pain - part of body: Leg                Time: 1318-1340 OT Time Calculation (min): 22 min Charges:  OT General Charges $OT Visit: 1 Visit OT Evaluation $OT  Eval Moderate Complexity: 1 Mod  Delight Stare, [place] Acute Rehabilitation Services Pager [phone] Office [phone]   Delight Stare 11/11/2018, 2:15 PM

## 2018-11-11 NOTE — Progress Notes (Signed)
RT offered assistance to pt with placing the CPAP dream station on and pt states he is able to do himself and thanked RT. RT will continue to monitor.

## 2018-11-11 NOTE — Progress Notes (Signed)
Ethan Campbell BTD:[id number] DOB: 1961/05/10 DOA: 11/06/2018 PCP: Arsenio Katz, NP   LOS: 5 days   Brief Narrative / Interim history: 58 year old male with history of recent right AKA about 2 weeks ago, hypertension, hyperlipidemia, seizure disorder, depression, hypothyroidism and coronary peripheral vascular disease presenting with right discomfort swelling and drainage.  Initially presented to [facility], where he was found to be anemic to 6.6 (previously 7.7 on 4/9), and transferred here for further management.  Received 1 unit in route. Hemodynamically stable on arrival.  Patient had revision of right AKA on 4/17 by Dr. Sharol Given.    Subjective: No major events overnight or this morning.  He has no complaints this morning.  Pain fairly controlled.  Denies chest pain, dyspnea or abdominal pain.  Assessment & Plan: Principal Problem:   Hematoma of amputation stump of right lower extremity (HCC) Active Problems:   Type II diabetes mellitus, uncontrolled (HCC)   Morbid obesity (HCC)   OBSTRUCTIVE SLEEP APNEA   Essential hypertension, benign   Acute renal failure superimposed on stage 3 chronic kidney disease (HCC)   Diabetic autonomic neuropathy associated with type 2 diabetes mellitus (HCC)   Below-knee amputation of right lower extremity (HCC)   Dehiscence of amputation stump (HCC)   Hematoma of amputation stump of left lower extremity (HCC)   Anticoagulants causing adverse effect in therapeutic use   S/P AKA (above knee amputation) unilateral, left (HCC)  Acute blood loss anemia likely due to bleeding from right AKA and anticoagulation.  Reportedly INR was 6.6 prior to arrival at [facility] per admitting provider.  Received 3 units of blood so far.  Hemoglobin stable at 7.0..  INR 1.3. -Right AKA revision and wound vac by Dr. Sharol Given 4/17. -Discussed with Dr. Sharol Given on 4/19 over the phone  -Weightbearing as tolerated on the left  -Ancef 4/17--4/20-tissue cultures  negative.  -?Keflex 4/10--we will discuss this with Dr. Sharol Given before discharge.  -SCD and aspirin 81 mg twice daily for VT prophylaxis  -We will take a wound VAC and continue dry dressing  -Check H&H in the morning -Continue p.o. iron supplementation -PT/OT-Home with [facility] PT/OT  Poorly controlled IDDM-2 with renal and vascular complication and hyperglycemia.  Not in DKA.  On insulin and Victoza at home.  CBG fairly controlled. -Lantus 40 units twice daily -SSI-moderate -CBG monitoring -Continue statin  History of sick sinus syndrome-status post pacemaker placement in 2019. -Continue home metoprolol  History of LUE DVT/supratherapeutic INR: Reportedly 6.6 prior to transfer.  Received FFP and vitamin K.  INR now 1.3.  Warfarin on hold due to hematoma and anemia. -Seems LUE DVT was provoked after pacemaker placement in 2019. -We will discontinue warfarin on discharge. -Aspirin as above for VTE prophylaxis.  History of left upper extremity DVT-likely provoked after pacemaker placement for SSS -On warfarin over a year now -Plan as above.  Chronic diastolic heart failure: Euvolemic. -Continue home Lasix -Daily weight, intake output and renal function.  Hypertension: Normotensive.  -Continue home blood pressure medications -Discontinue midodrine-no longer on this  OSA: Uses BiPAP at home. -Continue nightly CPAP  CKD 3: Stable  History of seizure disorder/depression stable -Continue home medications  Hypothyroidism: Stable -Continue home medications  Morbid obesity: BMI 41.8 -Needs lifestyle change to lose weight  Scheduled Meds: . aspirin EC  81 mg Oral BID  . atorvastatin  80 mg Oral q1800  . docusate sodium  100 mg Oral BID  . DULoxetine  60 mg Oral Daily  .  fenofibrate  160 mg Oral Daily  . ferrous sulfate  325 mg Oral TID WC  . FLUoxetine  20 mg Oral Daily  . furosemide  40 mg Oral Daily  . gabapentin  300 mg Oral BID  . insulin aspart  0-15 Units Subcutaneous TID  WC  . insulin aspart  0-5 Units Subcutaneous QHS  . insulin aspart  8 Units Subcutaneous TID WC  . insulin glargine  40 Units Subcutaneous BID  . lactulose  300 mL Rectal Once  . levothyroxine  100 mcg Oral QAC breakfast  . metoprolol tartrate  12.5 mg Oral BID  . multivitamin with minerals  1 tablet Oral Daily  . Ensure Max Protein  11 oz Oral TID WC  . tamsulosin  0.4 mg Oral Daily  . tiZANidine  4 mg Oral BID  . vitamin C  500 mg Oral BID  . zinc sulfate  220 mg Oral Daily   Continuous Infusions: .  ceFAZolin (ANCEF) IV 1 g (11/11/18 1536)  . lactated ringers 10 mL/hr at 11/09/18 0804   PRN Meds:.acetaminophen, HYDROcodone-acetaminophen, HYDROcodone-acetaminophen, meclizine, metoCLOPramide **OR** metoCLOPramide (REGLAN) injection, morphine injection, ondansetron **OR** ondansetron (ZOFRAN) IV, polyethylene glycol  DVT prophylaxis: SCD and aspirin Code Status: Full code Family Communication: None at bedside. Updated patient's significant other over the phone on 4/17. Appreciative.   Disposition Plan: We will discharge home with [facility] PT/OT after wound VAC removal  Consultants:   We are  Procedures:   Right AKA revision and wound VAC placement  Antimicrobials:  Ancef 4/17--  Objective: Vitals:   11/11/18 0046 11/11/18 0535 11/11/18 0928 11/11/18 1656  BP: 135/67 140/66 123/62 (!) 159/68  Pulse: 74 78 80 89  Resp: 10 15  15   Temp:  98.9 F (37.2 C)  98.1 F (36.7 C)  TempSrc:  Oral  Oral  SpO2:  97%  98%  Weight:      Height:        Intake/Output Summary (Last 24 hours) at 11/11/2018 1700 Last data filed at 11/11/2018 1622 Gross per 24 hour  Intake 920 ml  Output 5425 ml  Net -4505 ml   Filed Weights   11/06/18 1200 11/09/18 0754  Weight: 128.5 kg 128.5 kg    Examination:  GENERAL: Appears well. No acute distress.  HEENT: MMM.  Vision and Hearing grossly intact.  NECK: Supple.  No JVD.  LUNGS:  No IWOB. Good air movement. CTAB.  HEART:  RRR. Heart  sounds normal.  ABD: Bowel sounds present. Soft. Non tender.  EXT: Wound VAC over right AKA.  No drainage or bleeding. SKIN: no apparent skin lesion.  NEURO: Awake, alert and oriented appropriately.  No gross deficit.  PSYCH: Calm. Normal affect.  Data Reviewed: I have independently reviewed following labs and imaging studies   CBC: Recent Labs  Lab 11/06/18 1848  11/08/18 0334 11/08/18 2007 11/09/18 0247 11/10/18 0300 11/11/18 0245  WBC 15.2*   < > 11.4* 11.4* 9.6 10.3 10.6*  NEUTROABS 12.0*  --   --   --   --   --   --   HGB 6.0*   < > 7.4* 9.0* 8.0* 7.0* 7.0*  HCT 19.1*   < > 22.6* 27.2* 25.7* 22.0* 21.5*  MCV 85.7   < > 87.9 87.2 89.9 90.5 91.9  PLT 467*   < > 458* 500* 487* 487* 498*   < > = values in this interval not displayed.   Basic Metabolic Panel: Recent Labs  Lab 11/07/18  5956 11/08/18 0334 11/09/18 0247 11/10/18 0300 11/11/18 0245  NA 135 134* 140 133* 136  K 4.3 4.5 4.4 4.7 4.4  CL 103 103 105 100 103  CO2 25 25 28 27 26   GLUCOSE 245* 233* 112* 330* 234*  BUN 34* 39* 35* 33* 39*  CREATININE 1.42* 1.42* 1.27* 1.53* 1.40*  CALCIUM 8.2* 8.2* 8.6* 8.4* 8.5*   GFR: Estimated Creatinine Clearance: 77.2 mL/min (A) (by C-G formula based on SCr of 1.4 mg/dL (H)). Liver Function Tests: Recent Labs  Lab 11/07/18 0433  AST 17  ALT 23  ALKPHOS 94  BILITOT 1.0  PROT 5.6*  ALBUMIN 1.6*   No results for input(s): LIPASE, AMYLASE in the last 168 hours. No results for input(s): AMMONIA in the last 168 hours. Coagulation Profile: Recent Labs  Lab 11/07/18 0433 11/08/18 1305 11/09/18 0247 11/10/18 0300 11/11/18 0245  INR 1.4* 1.3* 1.3* 1.3* 1.2   Cardiac Enzymes: No results for input(s): CKTOTAL, CKMB, CKMBINDEX, TROPONINI in the last 168 hours. BNP (last 3 results) No results for input(s): PROBNP in the last 8760 hours. HbA1C: No results for input(s): HGBA1C in the last 72 hours. CBG: Recent Labs  Lab 11/10/18 1641 11/10/18 2216 11/11/18 0633  11/11/18 1110 11/11/18 1624  GLUCAP 199* 256* 137* 150* 126*   Lipid Profile: No results for input(s): CHOL, HDL, LDLCALC, TRIG, CHOLHDL, LDLDIRECT in the last 72 hours. Thyroid Function Tests: No results for input(s): TSH, T4TOTAL, FREET4, T3FREE, THYROIDAB in the last 72 hours. Anemia Panel: No results for input(s): VITAMINB12, FOLATE, FERRITIN, TIBC, IRON, RETICCTPCT in the last 72 hours. Urine analysis:    Component Value Date/Time   COLORURINE YELLOW 09/03/2015 1400   APPEARANCEUR CLEAR 09/03/2015 1400   LABSPEC 1.020 09/03/2015 1400   PHURINE 5.0 09/03/2015 1400   GLUCOSEU 500 (A) 09/03/2015 1400   GLUCOSEU 500 (A) 11/14/2013 1016   HGBUR NEGATIVE 09/03/2015 1400   BILIRUBINUR NEGATIVE 09/03/2015 1400   KETONESUR NEGATIVE 09/03/2015 1400   PROTEINUR NEGATIVE 09/03/2015 1400   UROBILINOGEN 0.2 03/24/2015 1530   NITRITE NEGATIVE 09/03/2015 1400   LEUKOCYTESUR NEGATIVE 09/03/2015 1400   Sepsis Labs: Invalid input(s): PROCALCITONIN, LACTICIDVEN  Recent Results (from the past 240 hour(s))  Surgical PCR screen     Status: None   Collection Time: 11/06/18  5:11 PM  Result Value Ref Range Status   MRSA, PCR NEGATIVE NEGATIVE Final   Staphylococcus aureus NEGATIVE NEGATIVE Final    Comment: (NOTE) The Xpert SA Assay (FDA approved for NASAL specimens in patients 15 years of age and older), is one component of a comprehensive surveillance program. It is not intended to diagnose infection nor to guide or monitor treatment. Performed at [facility], [place] [address], [place], [place] [address]   Aerobic/Anaerobic Culture (surgical/deep wound)     Status: None (Preliminary result)   Collection Time: 11/09/18 10:14 AM  Result Value Ref Range Status   Specimen Description TISSUE  Final   Special Requests RIGHT KNEE  Final   Gram Stain   Final    FEW WBC PRESENT, PREDOMINANTLY PMN NO ORGANISMS SEEN    Culture   Final    CULTURE REINCUBATED FOR BETTER GROWTH Performed  at [facility], [place] [address], [place], [place] [address]    Report Status PENDING  Incomplete      Radiology Studies: No results found.   Jessilyn Catino T. [facility] Pager [phone]  If 7PM-7AM, please contact night-coverage [email or web address] Password [facility] 11/11/2018, 5:00  PM

## 2018-11-12 LAB — BASIC METABOLIC PANEL
Anion gap: 7 (ref 5–15)
BUN: 34 mg/dL — ABNORMAL HIGH (ref 6–20)
CO2: 30 mmol/L (ref 22–32)
Calcium: 8.7 mg/dL — ABNORMAL LOW (ref 8.9–10.3)
Chloride: 103 mmol/L (ref 98–111)
Creatinine, Ser: 1.17 mg/dL (ref 0.61–1.24)
GFR calc Af Amer: >60 mL/min (ref >60)
GFR calc non Af Amer: >60 mL/min (ref >60)
Glucose, Bld: 90 mg/dL (ref 70–99)
Potassium: 3.7 mmol/L (ref 3.5–5.1)
Sodium: 140 mmol/L (ref 135–145)

## 2018-11-12 LAB — CBC
HCT: 24.2 % — ABNORMAL LOW (ref 39.0–52.0)
Hemoglobin: 7.2 g/dL — ABNORMAL LOW (ref 13.0–17.0)
MCH: 27.5 pg (ref 26.0–34.0)
MCHC: 29.8 g/dL — ABNORMAL LOW (ref 30.0–36.0)
MCV: 92.4 fL (ref 80.0–100.0)
Platelets: 520 10*3/uL — ABNORMAL HIGH (ref 150–400)
RBC: 2.62 MIL/uL — ABNORMAL LOW (ref 4.22–5.81)
RDW: 17.3 % — ABNORMAL HIGH (ref 11.5–15.5)
WBC: 7.7 10*3/uL (ref 4.0–10.5)
nRBC: 0 % (ref 0.0–0.2)

## 2018-11-12 LAB — GLUCOSE, CAPILLARY
Glucose-Capillary: 72 mg/dL (ref 70–99)
Glucose-Capillary: 177 mg/dL — ABNORMAL HIGH (ref 70–99)

## 2018-11-12 LAB — PROTIME-INR
INR: 1.1 (ref 0.8–1.2)
Prothrombin Time: 14.5 seconds (ref 11.4–15.2)

## 2018-11-12 MED ORDER — INSULIN GLARGINE 100 UNIT/ML SOLOSTAR PEN: 35.0000 [IU] | PEN_INJECTOR | Freq: Two times a day (BID) | SUBCUTANEOUS | 0 refills | Status: DC

## 2018-11-12 MED ORDER — ASPIRIN 81 MG PO TBEC: 81.0000 mg | DELAYED_RELEASE_TABLET | Freq: Two times a day (BID) | ORAL | 0 refills | Status: DC

## 2018-11-12 MED ORDER — OXYCODONE HCL 5 MG PO TABS: 5.0000 mg | ORAL_TABLET | Freq: Four times a day (QID) | ORAL | 0 refills | Status: DC | PRN

## 2018-11-12 MED ORDER — CARVEDILOL 6.25 MG PO TABS: 6.2500 mg | ORAL_TABLET | Freq: Two times a day (BID) | ORAL | 0 refills | Status: DC

## 2018-11-12 MED ORDER — CARVEDILOL 6.25 MG PO TABS
6.2500 mg | ORAL_TABLET | Freq: Two times a day (BID) | ORAL | Status: DC
  Administered 2018-11-12: 09:00:00 6.25 mg via ORAL
  Filled 2018-11-12: qty 1

## 2018-11-12 MED ORDER — GLUCAGON (RDNA) 1 MG IJ KIT: PACK | INTRAMUSCULAR | 1 refills | Status: DC

## 2018-11-12 MED FILL — GLUCAGON 1 MG EMERGENCY KIT: 1 | 1 days supply | Qty: 1 | Fill #0 | Status: TO

## 2018-11-12 MED FILL — oxyCODONE HCL 5 MG TABS: 5 | 5 days supply | Qty: 30 | Fill #0

## 2018-11-12 MED FILL — ASPIRIN LOW DOSE 81 MG TBEC: 81 | 30 days supply | Qty: 60 | Fill #0

## 2018-11-12 MED FILL — LANTUS SOLOSTAR 100 UNITS/M: 100 | 30 days supply | Qty: 21 | Fill #0

## 2018-11-12 MED FILL — CARVEDILOL 6.25 MG TABLET: 6.25 | 30 days supply | Qty: 60 | Fill #0

## 2018-11-12 NOTE — Progress Notes (Signed)
Occupational Therapy Treatment Patient Details Name: Ethan Campbell MRN: [id number] DOB: [birth year] Today's Date: 11/12/2018    History of present illness 58 y.o. male admitted on 11/06/2018 with hematoma on his AKA , recent R BKA that was gangrenous and had to be converted to an AKA on 10/26/18. Pt with significant PMH of R BKA on 10/05/18, DM2, seizure, sick sinus syndrome, obesity, essential HTN, CKD stage 3, L TKA.      OT comments  Pt demonstrated stand pivot transfers with RW and supervision x 2 this visit. No assist for bed mobility. Pt performed seated grooming once up to chair. Left pt seated in chair on phone with pharmacy. Pt is eager to go home today.  Follow Up Recommendations  Home health OT;Supervision/Assistance - 24 hour    Equipment Recommendations  None recommended by OT    Recommendations for Other Services      Precautions / Restrictions Precautions Precautions: Fall Precaution Comments: wound vac Restrictions Weight Bearing Restrictions: Yes RLE Weight Bearing: Non weight bearing       Mobility Bed Mobility Overal bed mobility: Modified Independent             General bed mobility comments: HOB up, use of rail, pt has a hospital bed at home  Transfers Overall transfer level: Needs assistance Equipment used: Rolling walker (2 wheeled) Transfers: Sit to/from [facility] Sit to Stand: Supervision Stand pivot transfers: Supervision       General transfer comment: good technique    Balance Overall balance assessment: Needs assistance   Sitting balance-Leahy Scale: Good     Standing balance support: Bilateral upper extremity supported Standing balance-Leahy Scale: Poor Standing balance comment: can release walker with one hand in static standing                           ADL either performed or assessed with clinical judgement   ADL Overall ADL's : Needs assistance/impaired     Grooming: Wash/dry  hands;Wash/dry face;Sitting;Set up                   Toilet Transfer: Supervision/safety;Stand-pivot;BSC;RW   Toileting- Clothing Manipulation and Hygiene: Minimal assistance;Sit to/from stand               Vision       Perception     Praxis      Cognition Arousal/Alertness: Awake/alert Behavior During Therapy: WFL for tasks assessed/performed Overall Cognitive Status: Within Functional Limits for tasks assessed                                          Exercises     Shoulder Instructions       General Comments      Pertinent Vitals/ Pain       Pain Assessment: No/denies pain  Home Living                                          Prior Functioning/Environment              Frequency  Min 2X/week        Progress Toward Goals  OT Goals(current goals can now be found in the care plan section)  Progress towards OT goals: Progressing  toward goals  Acute Rehab OT Goals Patient Stated Goal: go home soon OT Goal Formulation: With patient Time For Goal Achievement: 11/25/18 Potential to Achieve Goals: Good  Plan Discharge plan remains appropriate    Co-evaluation                 AM-PAC OT "6 Clicks" Daily Activity     Outcome Measure   Help from another person eating meals?: None Help from another person taking care of personal grooming?: None Help from another person toileting, which includes using toliet, bedpan, or urinal?: A Little Help from another person bathing (including washing, rinsing, drying)?: A Little Help from another person to put on and taking off regular upper body clothing?: None Help from another person to put on and taking off regular lower body clothing?: A Little 6 Click Score: 21    End of Session Equipment Utilized During Treatment: Gait belt;Rolling walker  OT Visit Diagnosis: Other abnormalities of gait and mobility (R26.89);Muscle weakness (generalized) (M62.81);Pain    Activity Tolerance Patient tolerated treatment well   Patient Left in chair;with call bell/phone within reach   Nurse Communication          Time: 1219-7588 OT Time Calculation (min): 24 min  Charges: OT General Charges $OT Visit: 1 Visit OT Treatments $Self Care/Home Management : 23-37 mins  Nestor Lewandowsky, OTR/L Acute Rehabilitation Services Pager: [phone] Office: [phone]   Malka So 11/12/2018, 11:26 AM

## 2018-11-12 NOTE — Progress Notes (Signed)
Patient ID: Ethan Campbell, male   DOB: Apr 27, 1961, 58 y.o.   MRN: [id number]  Plan for discharge today.  Patient will continue with the wound VAC using the portable Praveena pump.  I will follow-up in the office in 1 week to change the dressing.

## 2018-11-12 NOTE — Consult Note (Addendum)
WOC consult was previously requested to remove Vac dressing and apply dry dressing.   Dr Sharol Given of the ortho service is following for assessment and plan of care and has now ordered; "Plan for discharge today.  Patient will continue with the wound VAC using the portable Praveena pump.  I will follow-up in the office in 1 week to change the dressing." Current Vac dressing is intact with good seal to right stump.  Removed hospital Vac and applied Prevena portable device, functioning at 142mm cont suction.  Pt states he has used one before and is familiar with the machine functions. Please re-consult if further assistance is needed.  Thank-you,  Julien Girt MSN, [place], [place], [place], [place]

## 2018-11-12 NOTE — Progress Notes (Signed)
Pt discharged home with girlfriend. IV and telemetry box removed. AVS printed and given to pt. Pt discharged with meds filled by [facility]. Pt also discharged home with prevena vac to right stump. Pt discharged via PTAR. Home health arranged for follow-up.    Ara Kussmaul BSN, RN

## 2018-11-12 NOTE — Discharge Summary (Signed)
Physician Discharge Summary  MOROCCO GIPE LHT:[id number] DOB: 08/27/1960 DOA: 11/06/2018  PCP: Arsenio Katz, NP  Admit date: 11/06/2018 Discharge date: 11/12/2018  Admitted From: Home Disposition: Home  Recommendations for Outpatient Follow-up:  1. Follow up with PCP and orthopedic surgery in 1 week 2. Please obtain CBC and BMP at follow-up 3. Please follow up on the following pending results: None  Home Health: HH PT/OT/RN Equipment/Devices: Patient has commode at home.  Discharge Condition: Stable CODE STATUS: Full code  Hospital Course: 58 year old male with history of recent right AKA about 2 weeks ago, hypertension, hyperlipidemia, seizure disorder, depression, hypothyroidism and coronary peripheral vascular disease presenting with right discomfort swelling and drainage.  Initially presented to [facility], where he was found to be anemic to 6.6 (previously 7.7 on 4/9), and transferred here for further management. Received 1 unit in route. Hemodynamically stable on arrival.  Warfarin stopped.  Received another unit before surgery.  Had a revision of right AKA on 11/09/2018 by Dr. Sharol Given.  Wound VAC applied.  Started on Ancef after surgery.  Tissue culture came back negative.  Remained afebrile and without leukocytosis.  Antibiotic discontinued on day of discharge.   Discharged with wound VAC using the portable Praveena pump for 1 week.  Patient was started on aspirin 81 mg twice daily for VTE prophylaxis.  H&H remained stable.  Patient to follow-up with orthopedic surgery in 1 week for wound check.  Home health ordered on discharge.  See individual problem list below for more. Discharge Diagnoses:  Principal Problem:   Hematoma of amputation stump of right lower extremity ([place]) Active Problems:   Type II diabetes mellitus, uncontrolled (HCC)   Morbid obesity (HCC)   OBSTRUCTIVE SLEEP APNEA   Essential hypertension, benign   Acute renal failure superimposed on stage 3  chronic kidney disease (HCC)   Diabetic autonomic neuropathy associated with type 2 diabetes mellitus (HCC)   Below-knee amputation of right lower extremity (HCC)   Dehiscence of amputation stump (HCC)   Hematoma of amputation stump of left lower extremity (HCC)   Anticoagulants causing adverse effect in therapeutic use   S/P AKA (above knee amputation) unilateral, left (HCC)  Acute blood loss anemia likely due to bleeding from right AKA and anticoagulation.  Reportedly INR was 6.6 prior to arrival at [facility] per admitting provider.  Received 3 units of blood so far.  Hemoglobin stable at 7.2. -Right AKA revision and wound vac by Dr. Sharol Given 4/17.  -Ancef 4/17-4/20-tissue culture negative. -Aspirin 81 mg twice daily for VTE prophylaxis. -Scheduled Tylenol and PRN oxycodone for pain control. -Wound VAC with portable Praveena pump for 1 week -Follow-up with orthopedic surgery 1 week. -Oral ferrous sulfate  Poorly controlled IDDM-2 with renal and vascular complication and hyperglycemia.  Not in DKA.  On insulin and Victoza at home.  CBG fairly controlled. -Discharged on Lantus 35 units twice daily and other home medications.  History of sick sinus syndrome-status post pacemaker placement in 2019. -Continue home metoprolol  History of LUE DVT/supratherapeutic INR: Reportedly 6.6 prior to transfer.  Received FFP and vitamin K.  INR now 1.3.  Warfarin on hold due to hematoma and anemia. -Seems LUE DVT was provoked after pacemaker placement in 2019. -Warfarin discontinued -Aspirin as above for VTE prophylaxis.  History of left upper extremity DVT-likely provoked after pacemaker placement for SSS -On warfarin over a year now-warfarin discontinued this hospitalization. -Plan as above.  Chronic diastolic heart failure: Euvolemic. -Continue home Lasix -Daily weight, intake output and  renal function.  Hypertension: Blood pressure slightly elevated. -Change at home metoprolol to Coreg for  better control -Midodrine discontinued.  OSA: Uses BiPAP at home. -Continue nightly CPAP  CKD 3: Stable  History of seizure disorder/depression stable -Continue home medications  Hypothyroidism: Stable -Continue home medications  Morbid obesity: BMI 41.8 -Needs lifestyle change to lose weight  Discharge Instructions  Discharge Instructions    Call MD for:  persistant nausea and vomiting   Complete by:  As directed    Call MD for:  severe uncontrolled pain   Complete by:  As directed    Call MD for:  temperature >100.4   Complete by:  As directed    Diet - low sodium heart healthy   Complete by:  As directed    Diet Carb Modified   Complete by:  As directed    Discharge instructions   Complete by:  As directed    It has been a pleasure taking care of you! -You were admitted due to low blood level which was likely from bleeding from your surgical wound.  Bleeding has stopped after repeat surgery and we stopped warfarin.  We are letting you go home on antibiotic and aspirin.  There could be some changes to your medication.  Please read the directions before you take your medications.  Follow-up with Dr. Sharol Given in 1 week.  You will have home health team to come to your house to to help with moving, use of devices and medications.  Once you are discharged, your primary care physician will handle any further medical issues. Please note that NO REFILLS for any discharge medications will be authorized once you are discharged, as it is imperative that you return to your primary care physician (or establish a relationship with a primary care physician if you do not have one) for your aftercare needs so that they can reassess your need for medications and monitor your lab values. Take care,   Discharge instructions   Complete by:  As directed    It has been a pleasure taking care of you! You were admitted due to bleeding from your surgical wound.  This stopped after we stopped your  warfarin, and repeat surgery. Your blood level is stable.  Recommend follow-up with your primary care doctor and orthopedic surgery in 1 week..  We may have started you on a new medication.  There could also be some changes to oral medication. Please read the directions before you take your medications.   Once you are discharged, your primary care physician will handle any further medical issues. Please note that NO REFILLS for any discharge medications will be authorized once you are discharged, as it is imperative that you return to your primary care physician (or establish a relationship with a primary care physician if you do not have one) for your aftercare needs so that they can reassess your need for medications and monitor your lab values. Take care,   Increase activity slowly   Complete by:  As directed    Increase activity slowly   Complete by:  As directed    Negative Pressure Wound Therapy - Incisional   Complete by:  As directed    Attached the wound VAC dressing to the portable Praveena wound VAC pump.  Show patient how the unit needs to be plugged in to maintain its charge.  Patient will discharge with the portable pump and follow-up in the office in 1 week to change the dressing.  Allergies as of 11/12/2018   No Known Allergies     Medication List    STOP taking these medications   doxycycline 100 MG tablet Commonly known as:  VIBRA-TABS   FLUoxetine 20 MG capsule Commonly known as:  PROZAC   HYDROcodone-acetaminophen 5-325 MG tablet Commonly known as:  NORCO/VICODIN   metoprolol tartrate 25 MG tablet Commonly known as:  LOPRESSOR   warfarin 5 MG tablet Commonly known as:  COUMADIN     TAKE these medications   acetaminophen 325 MG tablet Commonly known as:  TYLENOL Take 650 mg by mouth every 4 (four) hours as needed for mild pain.   aspirin 81 MG EC tablet Take 1 tablet (81 mg total) by mouth 2 (two) times a day. What changed:  when to take this    atorvastatin 80 MG tablet Commonly known as:  LIPITOR Take 1 tablet (80 mg total) by mouth daily.   blood glucose meter kit and supplies Dispense based on patient and insurance preference. Use up to four times daily as directed. (FOR ICD-10 E10.9, E11.9).   carvedilol 6.25 MG tablet Commonly known as:  COREG Take 1 tablet (6.25 mg total) by mouth 2 (two) times daily with a meal.   DULoxetine 60 MG capsule Commonly known as:  CYMBALTA Take 1 capsule (60 mg total) by mouth daily.   fenofibrate 145 MG tablet Commonly known as:  TRICOR Take 1 tablet (145 mg total) by mouth daily.   ferrous sulfate 325 (65 FE) MG tablet Take 1 tablet (325 mg total) by mouth 3 (three) times daily with meals for 30 days.   freestyle lancets Use as instructed   furosemide 40 MG tablet Commonly known as:  LASIX Take 1 tablet (40 mg total) by mouth daily for 30 days.   gabapentin 300 MG capsule Commonly known as:  NEURONTIN Take 1 capsule (300 mg total) by mouth 2 (two) times daily.   glucagon 1 MG injection Follow package directions for low blood sugar.   glucose blood test strip Commonly known as:  Accu-Chek Aviva Plus TEST UP TO 4 TIMES DAILY Dx code E11.65   glucose blood test strip Commonly known as:  FREESTYLE LITE Check sugar 3 times daily   glucose blood test strip Commonly known as:  FREESTYLE TEST STRIPS Use as instructed   insulin aspart 100 UNIT/ML FlexPen Commonly known as:  NovoLOG FlexPen Inject 8 Units into the skin 3 (three) times daily with meals for 30 days.   Insulin Glargine 100 UNIT/ML Solostar Pen Commonly known as:  LANTUS Inject 35 Units into the skin 2 (two) times daily. What changed:    how much to take  when to take this   Insulin Pen Needle 32G X 8 MM Misc Use as directed   levothyroxine 100 MCG tablet Commonly known as:  SYNTHROID Take 1 tablet (100 mcg total) by mouth daily. Take 1 tablet daily before breakfast   liraglutide 18 MG/3ML Sopn  Commonly known as:  Victoza Inject 0.3 mLs (1.8 mg total) into the skin daily for 30 days.   meclizine 25 MG tablet Commonly known as:  ANTIVERT Take 1 tablet (25 mg total) by mouth every 8 (eight) hours as needed for dizziness.   multivitamin with minerals Tabs tablet Take 1 tablet by mouth daily for 30 days.   oxyCODONE 5 MG immediate release tablet Commonly known as:  Oxy IR/ROXICODONE Take 1 tablet (5 mg total) by mouth every 6 (six) hours as needed for severe  pain (pain score 7-10).   polyethylene glycol 17 g packet Commonly known as:  MIRALAX / GLYCOLAX Take 17 g by mouth daily as needed for mild constipation.   ProMod Liqd Take 30 mLs by mouth 3 (three) times daily for 30 days.   tamsulosin 0.4 MG Caps capsule Commonly known as:  FLOMAX Take 1 capsule (0.4 mg total) by mouth daily for 30 days.   tiZANidine 4 MG tablet Commonly known as:  ZANAFLEX Take 1 tablet (4 mg total) by mouth 2 (two) times daily for 30 days.   vitamin C 500 MG tablet Commonly known as:  ASCORBIC ACID Take 1 tablet (500 mg total) by mouth 2 (two) times daily for 30 days.   zinc sulfate 220 (50 Zn) MG capsule Take 1 capsule (220 mg total) by mouth daily for 30 days.      Follow-up Information    Newt Minion, MD Follow up in 1 week(s).   Specialty:  Orthopedic Surgery Contact information: [place] [address] [phone]           Consultations:  We are  Procedures/Studies:  2D Echo: None this admission.  Dg Femur Portable Min 2 Views Right  Result Date: 10/22/2018 CLINICAL DATA:  Right leg pain EXAM: RIGHT FEMUR PORTABLE 2 VIEW COMPARISON:  None. FINDINGS: There is no evidence of fracture or other focal bone lesions. Soft tissues are unremarkable. Right BKA with skin staples. Expected postoperative appearance. IMPRESSION: Expected postoperative appearance of right BKA.  No acute findings. Electronically Signed   By: Ulyses Jarred M.D.   On:  10/22/2018 19:15      Subjective: No major events overnight of this morning.  No complaint this morning.  Pain fairly controlled. Denies chest pain, dyspnea or lightheadedness.  Feels ready to go home.  H&H stable.  No leukocytosis.  Discharge Exam: Vitals:   11/12/18 0409 11/12/18 0903  BP: (!) 169/81 (!) 160/59  Pulse: 74 72  Resp: 15 12  Temp: 97.8 F (36.6 C)   SpO2: 99%     GENERAL: Appears well. No acute distress.  HEENT: MMM.  Vision and Hearing grossly intact.  NECK: Supple.  No JVD.  LUNGS:  No IWOB. Good air movement. CTAB.  HEART:  RRR. Heart sounds normal. ABD: Bowel sounds present. Soft. Non tender.  EXT: Wound VAC over right AKA.  No drainage or bleeding. SKIN: no apparent skin lesion.  NEURO: Awake, alert and oriented appropriately.  No gross deficit.  PSYCH: Calm. Normal affect.    The results of significant diagnostics from this hospitalization (including imaging, microbiology, ancillary and laboratory) are listed below for reference.     Microbiology: Recent Results (from the past 240 hour(s))  Surgical PCR screen     Status: None   Collection Time: 11/06/18  5:11 PM  Result Value Ref Range Status   MRSA, PCR NEGATIVE NEGATIVE Final   Staphylococcus aureus NEGATIVE NEGATIVE Final    Comment: (NOTE) The Xpert SA Assay (FDA approved for NASAL specimens in patients 59 years of age and older), is one component of a comprehensive surveillance program. It is not intended to diagnose infection nor to guide or monitor treatment. Performed at [facility], [place] [address] [place], [place] [address]   Aerobic/Anaerobic Culture (surgical/deep wound)     Status: None (Preliminary result)   Collection Time: 11/09/18 10:14 AM  Result Value Ref Range Status   Specimen Description TISSUE  Final   Special Requests RIGHT  KNEE  Final   Gram Stain   Final    FEW WBC PRESENT, PREDOMINANTLY PMN NO ORGANISMS SEEN    Culture   Final    CULTURE REINCUBATED  FOR BETTER GROWTH Performed at [facility], [place] [address] [place], [place] [address]    Report Status PENDING  Incomplete     Labs: BNP (last 3 results) Recent Labs    10/22/18 2057  BNP 81.1   Basic Metabolic Panel: Recent Labs  Lab 11/08/18 0334 11/09/18 0247 11/10/18 0300 11/11/18 0245 11/12/18 0446  NA 134* 140 133* 136 140  K 4.5 4.4 4.7 4.4 3.7  CL 103 105 100 103 103  CO2 _0 GLUCOSE 233* 112* 330* 234* 90  BUN 39* 35* 33* 39* 34*  CREATININE 1.42* 1.27* 1.53* 1.40* 1.17  CALCIUM 8.2* 8.6* 8.4* 8.5* 8.7*   Liver Function Tests: Recent Labs  Lab 11/07/18 0433  AST 17  ALT 23  ALKPHOS 94  BILITOT 1.0  PROT 5.6*  ALBUMIN 1.6*   No results for input(s): LIPASE, AMYLASE in the last 168 hours. No results for input(s): AMMONIA in the last 168 hours. CBC: Recent Labs  Lab 11/06/18 1848  11/08/18 2007 11/09/18 0247 11/10/18 0300 11/11/18 0245 11/12/18 0446  WBC 15.2*   < > 11.4* 9.6 10.3 10.6* 7.7  NEUTROABS 12.0*  --   --   --   --   --   --   HGB 6.0*   < > 9.0* 8.0* 7.0* 7.0* 7.2*  HCT 19.1*   < > 27.2* 25.7* 22.0* 21.5* 24.2*  MCV 85.7   < > 87.2 89.9 90.5 91.9 92.4  PLT 467*   < > 500* 487* 487* 498* 520*   < > = values in this interval not displayed.   Cardiac Enzymes: No results for input(s): CKTOTAL, CKMB, CKMBINDEX, TROPONINI in the last 168 hours. BNP: Invalid input(s): POCBNP CBG: Recent Labs  Lab 11/11/18 0633 11/11/18 1110 11/11/18 1624 11/11/18 2213 11/12/18 0612  GLUCAP 137* 150* 126* 164* 72   D-Dimer No results for input(s): DDIMER in the last 72 hours. Hgb A1c No results for input(s): HGBA1C in the last 72 hours. Lipid Profile No results for input(s): CHOL, HDL, LDLCALC, TRIG, CHOLHDL, LDLDIRECT in the last 72 hours. Thyroid function studies No results for input(s): TSH, T4TOTAL, T3FREE, THYROIDAB in the last 72 hours.  Invalid input(s): FREET3 Anemia work up No results for input(s): VITAMINB12,  FOLATE, FERRITIN, TIBC, IRON, RETICCTPCT in the last 72 hours. Urinalysis    Component Value Date/Time   COLORURINE YELLOW 09/03/2015 1400   APPEARANCEUR CLEAR 09/03/2015 1400   LABSPEC 1.020 09/03/2015 1400   PHURINE 5.0 09/03/2015 1400   GLUCOSEU 500 (A) 09/03/2015 1400   GLUCOSEU 500 (A) 11/14/2013 1016   HGBUR NEGATIVE 09/03/2015 1400   BILIRUBINUR NEGATIVE 09/03/2015 1400   KETONESUR NEGATIVE 09/03/2015 1400   PROTEINUR NEGATIVE 09/03/2015 1400   UROBILINOGEN 0.2 03/24/2015 1530   NITRITE NEGATIVE 09/03/2015 1400   LEUKOCYTESUR NEGATIVE 09/03/2015 1400   Sepsis Labs Invalid input(s): PROCALCITONIN,  WBC,  LACTICIDVEN   Time coordinating discharge: 25 minutes  SIGNED:  Mercy Riding, MD  [facility] 11/12/2018, 9:43 AM Pager [phone]  If 7PM-7AM, please contact night-coverage [email or web address] Password [email or web address]

## 2018-11-12 NOTE — TOC Transition Note (Signed)
Transition of Care [facility]) - CM/SW Discharge Note Marvetta Gibbons RN, BSN Transitions of Care Unit 4E- RN Case Manager [phone]  Patient Details  Name: Ethan Campbell MRN: [id number] Date of Birth: [birth year]  Transition of Care [facility]) CM/SW Contact:  Dawayne Patricia, RN Phone Number: 11/12/2018, 10:31 AM   Clinical Narrative:    Pt admitted with hematoma of AKA stump, s/p revision of AKA on 4/17 with prevena wound VAC placed. Pt is refusing SNF placement, was active with [facility] prior to admission for RN/PT- orders have been placed for HHRN/PT/OT for return home with wife. Pt has hospital bed and w/c at home from previous admission. Pt's spouse to provide transportation home. CM has notified Butch Penny with [facility] for resumption of [place] services, [facility] will f/u for start of care.    Final next level of care: [place] Barriers to Discharge: No Barriers Identified   Patient Goals and CMS Choice Patient states their goals for this hospitalization and ongoing recovery are:: "refusing SNF, wants to return home" CMS Medicare.gov Compare Post Acute Care list provided to:: Patient Choice offered to / list presented to : Spouse, Patient  Discharge Placement  Home with [facility] services to resume.                      Discharge Plan and Services In-house Referral: Clinical Social Work Discharge Planning Services: CM Consult Post Acute Care Choice: Home Health, Resumption of Svcs/PTA Provider              HH Arranged: RN, PT, OT HH Agency: [place] ([facility])   Social Determinants of Health (SDOH) Interventions     Readmission Risk Interventions Readmission Risk Prevention Plan 11/12/2018 10/25/2018  Transportation Screening Complete Complete  PCP or Specialist Appt within 3-5 Days Complete Complete  HRI or [place] Complete Complete  [place] or Home Care Consult comments - N/A  Social Work Consult for [place] Planning/Counseling Complete  Complete  Palliative Care Screening Not Applicable Not Applicable  Medication Review [employer]) Complete Complete  Some recent data might be hidden

## 2018-11-14 LAB — AEROBIC/ANAEROBIC CULTURE W GRAM STAIN (SURGICAL/DEEP WOUND): Culture: NO GROWTH

## 2018-11-20 ENCOUNTER — Other Ambulatory Visit: Payer: Self-pay

## 2018-11-20 ENCOUNTER — Ambulatory Visit (INDEPENDENT_AMBULATORY_CARE_PROVIDER_SITE_OTHER): Payer: Medicaid Other | Admitting: Orthopedic Surgery

## 2018-11-20 ENCOUNTER — Encounter (INDEPENDENT_AMBULATORY_CARE_PROVIDER_SITE_OTHER): Payer: Self-pay | Admitting: Orthopedic Surgery

## 2018-11-20 VITALS — Ht 69.0 in | Wt 283.3 lb

## 2018-11-20 DIAGNOSIS — I87323 Chronic venous hypertension (idiopathic) with inflammation of bilateral lower extremity: Secondary | ICD-10-CM

## 2018-11-20 DIAGNOSIS — Z794 Long term (current) use of insulin: Secondary | ICD-10-CM

## 2018-11-20 DIAGNOSIS — Z89611 Acquired absence of right leg above knee: Secondary | ICD-10-CM

## 2018-11-20 DIAGNOSIS — E1142 Type 2 diabetes mellitus with diabetic polyneuropathy: Secondary | ICD-10-CM

## 2018-11-20 NOTE — Progress Notes (Signed)
Office Visit Note   Patient: Ethan Campbell           Date of Birth: August 25, 1960           MRN: [id number] Visit Date: 11/20/2018              Requested by: Arsenio Katz, NP [place], [place] [address] PCP: Arsenio Katz, NP  Chief Complaint  Patient presents with  . Right Leg - Routine Post Op    11/09/2018 right leg AKA      HPI: The patient is a 58 year old gentleman who is seen for postoperative follow-up following a revision of his right above-the-knee amputation on 11/09/2018.  He has had the [facility] on since his discharge home and this was removed today and there was no drainage in the canister.  He reports his blood sugars have been drifting up somewhat after some adjustments while hospitalized to his insulin regimen and he is going to call his endocrinologist for further direction on how to adjust his insulin doses.  He comes in today with his daughter. He is on baby aspirin twice daily for VTE prophylaxis as his warfarin was stopped during his hospitalization due to bleeding issues following his previous surgery.  Operative cultures were negative and the patient was treated with perioperative antibiotics and these were discontinued at discharge.  Assessment & Plan: Visit Diagnoses:  1. 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 Type 2 diabetes mellitus with diabetic polyneuropathy, with long-term current use of insulin ([place])   3. Chronic venous hypertension (idiopathic) with inflammation of bilateral lower extremity     Plan: The Praveena VAC was removed.  There was no drainage in the canister.  He was instructed that he can begin to shower with Dial soap and water.  He was given a prescription for [employer] for stump shrinker stockings.  Follow-Up Instructions: Return in about 1 week (around 11/27/2018).   Ortho Exam  Patient is alert, oriented, no adenopathy, well-dressed, normal affect, normal respiratory effort. The right above-the-knee  amputation Praveena VAC was removed and the incision is intact with staples and sutures and without drainage.  There are no signs of infection or cellulitis.  The residual limb is quite full and they are going to go for evaluation for shrinker stockings.      Imaging: No results found. No images are attached to the encounter.  Labs: Lab Results  Component Value Date   HGBA1C 6.9 (A) 06/15/2018   HGBA1C 10.9 (A) 02/21/2018   HGBA1C 13.4 09/11/2017   ESRSEDRATE 130 (H) 10/22/2018   CRP 15.1 (H) 10/22/2018   REPTSTATUS 11/14/2018 FINAL 11/09/2018   GRAMSTAIN  11/09/2018    FEW WBC PRESENT, PREDOMINANTLY PMN NO ORGANISMS SEEN    CULT  11/09/2018    No growth aerobically or anaerobically. Performed at [facility], [place] [address] [place], [place] [address]      Lab Results  Component Value Date   ALBUMIN 1.6 (L) 11/07/2018   ALBUMIN 1.5 (L) 10/29/2018   ALBUMIN 2.4 (L) 10/22/2018    Body mass index is 41.83 kg/m.  Orders:  No orders of the defined types were placed in this encounter.  No orders of the defined types were placed in this encounter.    Procedures: No procedures performed  Clinical Data: No additional findings.  ROS:  All other systems negative, except as noted in the HPI. Review of Systems  Objective: Vital Signs: Ht  _0  (1.753 m)   Wt 283 lb 4.6 oz (128.5 kg)   BMI 41.83 kg/m   Specialty Comments:  No specialty comments available.  PMFS History: Patient Active Problem List   Diagnosis Date Noted  . S/P AKA (above knee amputation) unilateral, left ([place]) 11/09/2018  . Hematoma of amputation stump of left lower extremity ([place]) 11/06/2018  . Hematoma of amputation stump of right lower extremity ([place])   . Anticoagulants causing adverse effect in therapeutic use   . Dehiscence of amputation stump ([place])   . Severe protein-calorie malnutrition ([place])   . Uncontrolled type 2 diabetes mellitus with polyneuropathy ([place]) 10/22/2018  .  Normocytic anemia 10/22/2018  . Sepsis ([place]) 10/22/2018  . Wound infection after surgery 10/22/2018  . DVT (deep venous thrombosis) ([place]) 10/22/2018  . Hypothyroidism 10/22/2018  . Close Exposure to Covid-19 Virus 10/22/2018  . Below-knee amputation of right lower extremity ([place]) 10/05/2018  . Gangrene of right foot ([place])   . Amputated toe, right ([place]) 09/26/2018  . Subacute osteomyelitis, right ankle and foot ([place])   . Osteomyelitis of great toe of right foot ([place]) 06/19/2018  . Diabetic polyneuropathy associated with type 2 diabetes mellitus ([place]) 06/19/2018  . Body mass index 45.0-49.9, adult ([place]) 06/19/2018  . Seizure disorder ([place]) 08/27/2015  . Somnolence, daytime 08/27/2015  . Orthostatic hypotension 11/27/2014  . HLD (hyperlipidemia) 11/27/2014  . Syncope and collapse 08/20/2014  . Diabetic autonomic neuropathy associated with type 2 diabetes mellitus ([place]) 05/01/2014  . Dizzy spells 03/17/2014  . Acute renal failure superimposed on stage 3 chronic kidney disease ([place]) 08/22/2013  . Type II diabetes mellitus, uncontrolled ([place]) 12/03/2009  . HYPERLIPIDEMIA 12/03/2009  . Morbid obesity ([place]) 12/03/2009  . OBSTRUCTIVE SLEEP APNEA 12/03/2009  . Essential hypertension, benign 12/03/2009  . EDEMA 12/03/2009  . PRECORDIAL PAIN 12/03/2009  . PROTEINURIA 12/03/2009   Past Medical History:  Diagnosis Date  . Arm vein blood clot, left    on Coumadin  . Arthritis   . Cancer ([place])    Bone cancer 1987 (in knee Large cell tumor)  . Chronic kidney disease    stage 3  . Depression   . Essential hypertension   . Family history of adverse reaction to anesthesia    " my mother woke up during a cardiac surgery "  . History of stroke   . Hyperlipidemia   . Hypothyroidism   . Insomnia   . Obesity   . Precordial pain June 2011   Nuclear stress; no ischemia; EF 60%  . Presence of permanent cardiac pacemaker   . Proteinuria   . Seizure disorder ([place])   . Sick sinus syndrome ([place])    . Sleep apnea    Dr. Brandon Melnick, uses bipap  . Syncope   . Type 2 diabetes mellitus ([place])   . Venous insufficiency     Family History  Problem Relation Age of Onset  . Heart attack Mother 64  . Breast cancer Mother   . Stroke Mother   . Heart attack Father 85  . Breast cancer Sister   . Colon cancer Sister     Past Surgical History:  Procedure Laterality Date  . AMPUTATION Right 06/20/2018   Procedure: RIGHT GREAT TOE AMPUTATION;  Surgeon: Newt Minion, MD;  Location: [facility];  Service: Orthopedics;  Laterality: Right;  . AMPUTATION Right 09/26/2018   Procedure: RIGHT FOOT 5TH RAY AMPUTATION;  Surgeon: Newt Minion, MD;  Location: [facility];  Service: Orthopedics;  Laterality:  Right;  MAC and regional anesthesia  . AMPUTATION Right 10/05/2018   Procedure: RIGHT BELOW KNEE AMPUTATION;  Surgeon: Newt Minion, MD;  Location: [facility];  Service: Orthopedics;  Laterality: Right;  . AMPUTATION Right 10/26/2018   Procedure: RIGHT ABOVE KNEE AMPUTATION;  Surgeon: Newt Minion, MD;  Location: [facility];  Service: Orthopedics;  Laterality: Right;  . COLONOSCOPY    . RESECTION BONE TUMOR FEMUR  1980's   Left femur, treated at [facility] with bone graft  . STUMP REVISION Right 11/09/2018   Procedure: REVISION RIGHT ABOVE KNEE AMPUTATION;  Surgeon: Newt Minion, MD;  Location: [place];  Service: Orthopedics;  Laterality: Right;  . TOTAL KNEE ARTHROPLASTY Left 2013   Social History   Occupational History  . Occupation: Disabled  Tobacco Use  . Smoking status: Former Smoker    Types: Cigarettes  . Smokeless tobacco: Never Used  . Tobacco comment: 11/27/14 "quit smoking years ago"  Substance and Sexual Activity  . Alcohol use: No    Alcohol/week: 0.0 standard drinks  . Drug use: No  . Sexual activity: Not Currently

## 2018-11-27 ENCOUNTER — Ambulatory Visit (INDEPENDENT_AMBULATORY_CARE_PROVIDER_SITE_OTHER): Payer: Medicaid Other | Admitting: Physician Assistant

## 2018-11-27 ENCOUNTER — Encounter: Payer: Self-pay | Admitting: Endocrinology

## 2018-11-27 ENCOUNTER — Encounter: Payer: Self-pay | Admitting: Orthopedic Surgery

## 2018-11-27 ENCOUNTER — Ambulatory Visit (INDEPENDENT_AMBULATORY_CARE_PROVIDER_SITE_OTHER): Payer: Medicaid Other | Admitting: Endocrinology

## 2018-11-27 ENCOUNTER — Other Ambulatory Visit: Payer: Self-pay

## 2018-11-27 VITALS — BP 102/50 | HR 100 | Ht 69.0 in

## 2018-11-27 VITALS — Ht 69.0 in | Wt 283.0 lb

## 2018-11-27 DIAGNOSIS — Z794 Long term (current) use of insulin: Secondary | ICD-10-CM | POA: Diagnosis not present

## 2018-11-27 DIAGNOSIS — E1165 Type 2 diabetes mellitus with hyperglycemia: Secondary | ICD-10-CM | POA: Diagnosis not present

## 2018-11-27 DIAGNOSIS — Z89611 Acquired absence of right leg above knee: Secondary | ICD-10-CM

## 2018-11-27 LAB — POCT GLYCOSYLATED HEMOGLOBIN (HGB A1C): Hemoglobin A1C: 7 % — AB (ref 4.0–5.6)

## 2018-11-27 MED ORDER — HYDROCODONE-ACETAMINOPHEN 5-325 MG PO TABS: 1.0000 | ORAL_TABLET | Freq: Four times a day (QID) | ORAL | 0 refills | Status: DC | PRN

## 2018-11-27 MED ORDER — GABAPENTIN 300 MG PO CAPS: 300.0000 mg | ORAL_CAPSULE | Freq: Three times a day (TID) | ORAL | 11 refills | Status: DC

## 2018-11-27 NOTE — Progress Notes (Signed)
Office Visit Note   Patient: Ethan Campbell           Date of Birth: 06-Dec-1960           MRN: [id number] Visit Date: 11/27/2018              Requested by: Arsenio Katz, NP [place], [place] [address] PCP: Arsenio Katz, NP  Chief Complaint  Patient presents with  . Right Leg - Routine Post Op    11/09/2018 revision right AKA      HPI: The patient is a 58 yo gentleman who is seen for post operative follow up following revision of a right below the knee amputation 11/09/2018.He is about 3 weeks post op.  He has a new shrinker stocking and reports it fits somewhat better as the last shrinker kept popping off. He has not been consistently wearing the shrinker.  He is working with [employer]. He reports he has not been sleeping well due to phantom pains. He is on gabapentin 300 mg BID and we discussed increasing this to TID.   Assessment & Plan: Visit Diagnoses:  1. 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 Continue shrinker stocking as much as possible. Follow up in 1 week for staple and suture removal.   Follow-Up Instructions: Return in about 4 weeks (around 12/25/2018).   Ortho Exam  Patient is alert, oriented, no adenopathy, well-dressed, normal affect, normal respiratory effort. The right above the knee amputation edema is improved and sutures under less tension. No drainage from incision. No signs of infection or cellulitis. Assisted patient in donning shrinker.   Imaging: No results found.   Labs: Lab Results  Component Value Date   HGBA1C 6.9 (A) 06/15/2018   HGBA1C 10.9 (A) 02/21/2018   HGBA1C 13.4 09/11/2017   ESRSEDRATE 130 (H) 10/22/2018   CRP 15.1 (H) 10/22/2018   REPTSTATUS 11/14/2018 FINAL 11/09/2018   GRAMSTAIN  11/09/2018    FEW WBC PRESENT, PREDOMINANTLY PMN NO ORGANISMS SEEN    CULT  11/09/2018    No growth aerobically or anaerobically. Performed  at [facility], [place] [address], [place], [place] [address]      Lab Results  Component Value Date   ALBUMIN 1.6 (L) 11/07/2018   ALBUMIN 1.5 (L) 10/29/2018   ALBUMIN 2.4 (L) 10/22/2018    Body mass index is 41.79 kg/m.  Orders:  No orders of the defined types were placed in this encounter.  Meds ordered this encounter  Medications  . gabapentin (NEURONTIN) 300 MG capsule    Sig: Take 1 capsule (300 mg total) by mouth 3 (three) times daily.    Dispense:  90 capsule    Refill:  11  . HYDROcodone-acetaminophen (NORCO/VICODIN) 5-325 MG tablet    Sig: Take 1 tablet by mouth every 6 (six) hours as needed for moderate pain.    Dispense:  30 tablet    Refill:  0     Procedures: No procedures performed  Clinical Data: No additional findings.  ROS:  All other systems negative, except as noted in the HPI. Review of Systems  Objective: Vital Signs: Ht _0  (1.753 m)   Wt 283 lb (128.4 kg)   BMI 41.79 kg/m   Specialty Comments:  No specialty comments available.  PMFS History: Patient Active Problem List   Diagnosis Date Noted  . S/P AKA (above knee  amputation) unilateral, left ([place]) 11/09/2018  . Hematoma of amputation stump of left lower extremity ([place]) 11/06/2018  . Hematoma of amputation stump of right lower extremity ([place])   . Anticoagulants causing adverse effect in therapeutic use   . Dehiscence of amputation stump ([place])   . Severe protein-calorie malnutrition ([place])   . Uncontrolled type 2 diabetes mellitus with polyneuropathy ([place]) 10/22/2018  . Normocytic anemia 10/22/2018  . Sepsis ([place]) 10/22/2018  . Wound infection after surgery 10/22/2018  . DVT (deep venous thrombosis) ([place]) 10/22/2018  . Hypothyroidism 10/22/2018  . Close Exposure to Covid-19 Virus 10/22/2018  . Below-knee amputation of right lower extremity ([place]) 10/05/2018  . Gangrene of right foot ([place])   . Amputated toe, right ([place]) 09/26/2018  . Subacute osteomyelitis, right ankle and  foot ([place])   . Osteomyelitis of great toe of right foot ([place]) 06/19/2018  . Diabetic polyneuropathy associated with type 2 diabetes mellitus ([place]) 06/19/2018  . Body mass index 45.0-49.9, adult ([place]) 06/19/2018  . Seizure disorder ([place]) 08/27/2015  . Somnolence, daytime 08/27/2015  . Orthostatic hypotension 11/27/2014  . HLD (hyperlipidemia) 11/27/2014  . Syncope and collapse 08/20/2014  . Diabetic autonomic neuropathy associated with type 2 diabetes mellitus ([place]) 05/01/2014  . Dizzy spells 03/17/2014  . Acute renal failure superimposed on stage 3 chronic kidney disease ([place]) 08/22/2013  . Type II diabetes mellitus, uncontrolled ([place]) 12/03/2009  . HYPERLIPIDEMIA 12/03/2009  . Morbid obesity ([place]) 12/03/2009  . OBSTRUCTIVE SLEEP APNEA 12/03/2009  . Essential hypertension, benign 12/03/2009  . EDEMA 12/03/2009  . PRECORDIAL PAIN 12/03/2009  . PROTEINURIA 12/03/2009   Past Medical History:  Diagnosis Date  . Arm vein blood clot, left    on Coumadin  . Arthritis   . Cancer ([place])    Bone cancer 1987 (in knee Large cell tumor)  . Chronic kidney disease    stage 3  . Depression   . Essential hypertension   . Family history of adverse reaction to anesthesia    " my mother woke up during a cardiac surgery "  . History of stroke   . Hyperlipidemia   . Hypothyroidism   . Insomnia   . Obesity   . Precordial pain June 2011   Nuclear stress; no ischemia; EF 60%  . Presence of permanent cardiac pacemaker   . Proteinuria   . Seizure disorder ([place])   . Sick sinus syndrome ([place])   . Sleep apnea    Dr. Brandon Melnick, uses bipap  . Syncope   . Type 2 diabetes mellitus ([place])   . Venous insufficiency     Family History  Problem Relation Age of Onset  . Heart attack Mother 38  . Breast cancer Mother   . Stroke Mother   . Heart attack Father 67  . Breast cancer Sister   . Colon cancer Sister     Past Surgical History:  Procedure Laterality Date  . AMPUTATION Right 06/20/2018    Procedure: RIGHT GREAT TOE AMPUTATION;  Surgeon: Newt Minion, MD;  Location: [facility];  Service: Orthopedics;  Laterality: Right;  . AMPUTATION Right 09/26/2018   Procedure: RIGHT FOOT 5TH RAY AMPUTATION;  Surgeon: Newt Minion, MD;  Location: [facility];  Service: Orthopedics;  Laterality: Right;  MAC and regional anesthesia  . AMPUTATION Right 10/05/2018   Procedure: RIGHT BELOW KNEE AMPUTATION;  Surgeon: Newt Minion, MD;  Location: [facility];  Service: Orthopedics;  Laterality: Right;  . AMPUTATION Right 10/26/2018   Procedure: RIGHT ABOVE KNEE  AMPUTATION;  Surgeon: Newt Minion, MD;  Location: [facility];  Service: Orthopedics;  Laterality: Right;  . COLONOSCOPY    . RESECTION BONE TUMOR FEMUR  1980's   Left femur, [facility] at [facility] with bone graft  . STUMP REVISION Right 11/09/2018   Procedure: REVISION RIGHT ABOVE KNEE AMPUTATION;  Surgeon: Newt Minion, MD;  Location: [facility];  Service: Orthopedics;  Laterality: Right;  . TOTAL KNEE ARTHROPLASTY Left 2013   Social History   Occupational History  . Occupation: Disabled  Tobacco Use  . Smoking status: Former Smoker    Types: Cigarettes  . Smokeless tobacco: Never Used  . Tobacco comment: 11/27/14 "quit smoking years ago"  Substance and Sexual Activity  . Alcohol use: No    Alcohol/week: 0.0 standard drinks  . Drug use: No  . Sexual activity: Not Currently

## 2018-11-27 NOTE — Patient Instructions (Addendum)
4 units for lunch and 6 units dinner, 30 min before the meal

## 2018-11-27 NOTE — Progress Notes (Signed)
Patient ID: Ethan Campbell, male   DOB: 1961-07-07, 58 y.o.   MRN: [id number]   Reason for Appointment: follow-up   History of Present Illness   Diagnosis: Type 2 DIABETES MELITUS, date of diagnosis: 2000    Previous history: he has been on insulin for several years with consistently poor control Has been requiring large doses of insulin for his diabetes but A1c has been persistently high Blood sugars did not improve significantly even with trying Byetta and Victoza In 2014 he was switched from NovoLog to U-500 insulin but not clear if he has had improvement in control except with fasting readings His prior A1c was 14.0 in 5/14 and he had educational discussions with diabetes educator and dietitian in 6/14   He was started on the OMNIPOD pump on 02/06/2018  Recent history:  Insulin regimen:  Lantus 35 units twice daily.  NovoLog 8 units before meals  Non-insulin hypoglycemic drugs: Victoza 1.8 mg daily  Previous management: Humulin R U-500 with Omnipod PUMP Pump settings: Basal rate: Midnight =0.4,   5a.m. = 0.65, 12 noon = 0.9 and 9 PM = 0.75  Preset boluses: 6 units for lunch and 8 units dinner  A1c is now 7% compared to 6.9   Current management, blood sugar patterns and problems identified:  Since he had his right above knee amputation he has not been using insulin pump and has not been allowed to use his pump during and after his hospitalizations  With this his blood sugars are poorly controlled  Previously had excellent control overall with previous average blood sugar about 160 and now about 330  His last surgery was on 11/09/2018  He did not call after his discharge to report that he had higher blood sugars with taking Lantus and NovoLog  Blood sugars are averaging nearly 300 fasting and only slightly better in the afternoon, highest readings are late evening  As before mostly eating 2 meals a day and occasionally breakfast also  Prior to surgery he  had started using the freestyle libre sensor and had no difficulties with this, has not started back on this   Mealtimes: 12 pm, 6 pm  Proper timing of medications in relation to meals: Yes, usually 30 minutes before eating .          Monitors blood glucose:  2-  4 times a day.    Glucometer:  Accu-Chek  Aviva    Blood Glucose readings from download:    PRE-MEAL Fasting Lunch Dinner Bedtime Overall  Glucose range:  208-373  243-323  349, 522    Mean/median:  304     333   POST-MEAL PC Breakfast PC Lunch PC Dinner  Glucose range:   212  309-379  Mean/median:       Previous readings:  PRE-MEAL Fasting Lunch Dinner Bedtime Overall  Glucose range:  123-190  78-327  75-224    Mean/median:     162     Physical activity: exercise:none   Last dietitian visit: 6/18 and last CDE visit in 4/16  Wt Readings from Last 3 Encounters:  11/27/18 283 lb (128.4 kg)  11/20/18 283 lb 4.6 oz (128.5 kg)  11/09/18 283 lb 4.7 oz (128.5 kg)    LABS:   Lab Results  Component Value Date   HGBA1C 6.9 (A) 06/15/2018   HGBA1C 10.9 (A) 02/21/2018   HGBA1C 13.4 09/11/2017   Lab Results  Component Value Date   MICROALBUR 1.6 11/06/2017   LDLCALC 61  11/06/2017   CREATININE 1.17 11/12/2018    Lab Results  Component Value Date   FRUCTOSAMINE 250 08/15/2018   FRUCTOSAMINE 512 (H) 11/06/2017   FRUCTOSAMINE 494 (H) 12/22/2016    OTHER active problems are discussed in review of systems   Allergies as of 11/27/2018   No Known Allergies     Medication List       Accurate as of Nov 27, 2018  3:27 PM. Always use your most recent med list.        acetaminophen 325 MG tablet Commonly known as:  TYLENOL Take 650 mg by mouth every 4 (four) hours as needed for mild pain.   aspirin 81 MG EC tablet Take 1 tablet (81 mg total) by mouth 2 (two) times a day.   atorvastatin 80 MG tablet Commonly known as:  LIPITOR Take 1 tablet (80 mg total) by mouth daily.   blood glucose meter kit and  supplies Dispense based on patient and insurance preference. Use up to four times daily as directed. (FOR ICD-10 E10.9, E11.9).   carvedilol 6.25 MG tablet Commonly known as:  COREG Take 1 tablet (6.25 mg total) by mouth 2 (two) times daily with a meal.   DULoxetine 60 MG capsule Commonly known as:  CYMBALTA Take 1 capsule (60 mg total) by mouth daily.   fenofibrate 145 MG tablet Commonly known as:  TRICOR Take 1 tablet (145 mg total) by mouth daily.   ferrous sulfate 325 (65 FE) MG tablet Take 1 tablet (325 mg total) by mouth 3 (three) times daily with meals for 30 days.   freestyle lancets Use as instructed   furosemide 40 MG tablet Commonly known as:  LASIX Take 40 mg by mouth See admin instructions. Take 43m by mouth in the morning and 40 mg in the evening.   gabapentin 300 MG capsule Commonly known as:  NEURONTIN Take 300 mg by mouth 3 (three) times daily. Take 3093mby mouth three times daily.   glucagon 1 MG injection Follow package directions for low blood sugar.   glucose blood test strip Commonly known as:  Accu-Chek Aviva Plus TEST UP TO 4 TIMES DAILY Dx code E11.65   glucose blood test strip Commonly known as:  FREESTYLE LITE Check sugar 3 times daily   HYDROcodone-acetaminophen 5-325 MG tablet Commonly known as:  NORCO/VICODIN Take 1 tablet by mouth every 6 (six) hours as needed for moderate pain.   insulin aspart 100 UNIT/ML FlexPen Commonly known as:  NovoLOG FlexPen Inject 8 Units into the skin 3 (three) times daily with meals for 30 days.   Insulin Glargine 100 UNIT/ML Solostar Pen Commonly known as:  LANTUS Inject 35 Units into the skin 2 (two) times daily.   Insulin Pen Needle 32G X 8 MM Misc Use as directed   levothyroxine 100 MCG tablet Commonly known as:  SYNTHROID Take 1 tablet (100 mcg total) by mouth daily. Take 1 tablet daily before breakfast   liraglutide 18 MG/3ML Sopn Commonly known as:  Victoza Inject 0.3 mLs (1.8 mg total)  into the skin daily for 30 days.   meclizine 25 MG tablet Commonly known as:  ANTIVERT Take 1 tablet (25 mg total) by mouth every 8 (eight) hours as needed for dizziness.   multivitamin with minerals Tabs tablet Take 1 tablet by mouth daily for 30 days.   polyethylene glycol 17 g packet Commonly known as:  MIRALAX / GLYCOLAX Take 17 g by mouth daily as needed for mild constipation.  ProMod Liqd Take 30 mLs by mouth 3 (three) times daily for 30 days.   tamsulosin 0.4 MG Caps capsule Commonly known as:  FLOMAX Take 1 capsule (0.4 mg total) by mouth daily for 30 days.   tiZANidine 4 MG tablet Commonly known as:  ZANAFLEX Take 1 tablet (4 mg total) by mouth 2 (two) times daily for 30 days.   vitamin C 500 MG tablet Commonly known as:  ASCORBIC ACID Take 1 tablet (500 mg total) by mouth 2 (two) times daily for 30 days.   zinc sulfate 220 (50 Zn) MG capsule Take 1 capsule (220 mg total) by mouth daily for 30 days.       Allergies: No Known Allergies  Past Medical History:  Diagnosis Date  . Arm vein blood clot, left    on Coumadin  . Arthritis   . Cancer ([place])    Bone cancer 1987 (in knee Large cell tumor)  . Chronic kidney disease    stage 3  . Depression   . Essential hypertension   . Family history of adverse reaction to anesthesia    " my mother woke up during a cardiac surgery "  . History of stroke   . Hyperlipidemia   . Hypothyroidism   . Insomnia   . Obesity   . Precordial pain June 2011   Nuclear stress; no ischemia; EF 60%  . Presence of permanent cardiac pacemaker   . Proteinuria   . Seizure disorder ([place])   . Sick sinus syndrome ([place])   . Sleep apnea    Dr. Brandon Melnick, uses bipap  . Syncope   . Type 2 diabetes mellitus ([place])   . Venous insufficiency     Past Surgical History:  Procedure Laterality Date  . AMPUTATION Right 06/20/2018   Procedure: RIGHT GREAT TOE AMPUTATION;  Surgeon: Newt Minion, MD;  Location: [facility];  Service: Orthopedics;   Laterality: Right;  . AMPUTATION Right 09/26/2018   Procedure: RIGHT FOOT 5TH RAY AMPUTATION;  Surgeon: Newt Minion, MD;  Location: [facility];  Service: Orthopedics;  Laterality: Right;  MAC and regional anesthesia  . AMPUTATION Right 10/05/2018   Procedure: RIGHT BELOW KNEE AMPUTATION;  Surgeon: Newt Minion, MD;  Location: [facility];  Service: Orthopedics;  Laterality: Right;  . AMPUTATION Right 10/26/2018   Procedure: RIGHT ABOVE KNEE AMPUTATION;  Surgeon: Newt Minion, MD;  Location: [facility];  Service: Orthopedics;  Laterality: Right;  . COLONOSCOPY    . RESECTION BONE TUMOR FEMUR  1980's   Left femur, [facility] at [facility] with bone graft  . STUMP REVISION Right 11/09/2018   Procedure: REVISION RIGHT ABOVE KNEE AMPUTATION;  Surgeon: Newt Minion, MD;  Location: [facility];  Service: Orthopedics;  Laterality: Right;  . TOTAL KNEE ARTHROPLASTY Left 2013    Family History  Problem Relation Age of Onset  . Heart attack Mother 27  . Breast cancer Mother   . Stroke Mother   . Heart attack Father 61  . Breast cancer Sister   . Colon cancer Sister     Social History:  reports that he has quit smoking. His smoking use included cigarettes. He has never used smokeless tobacco. He reports that he does not drink alcohol or use drugs.  Review of Systems:    HYPOTHYROIDISM:   on  levothyroxine 100 ug daily The dose was increased in 11/19 when TSH was high on 88 mcg He feels fairly good overall   Lab Results  Component  Value Date   TSH 4.35 08/15/2018   SYNCOPAL episodes:  Neurologist indicated that he may have vertebral artery flow issues  His antihypertensives have been stopped and he has not had any syncope Recently only low-dose carvedilol has been given for hypertension  Lipids: triglycerides on labs on the last measurement are below 200  Followed by PCP   Lab Results  Component Value Date   CHOL 126 11/06/2017   HDL 34.40 (L) 11/06/2017   LDLCALC 61 11/06/2017   LDLDIRECT 80.0  03/07/2016   TRIG 155.0 (H) 11/06/2017   CHOLHDL 4 11/06/2017      CKD: His creatinine has been In the 2-3 range in the past but now surprisingly normal   Lab Results  Component Value Date   CREATININE 1.17 11/12/2018   History of CHF is taking Lasix using 80 mg tablet    Examination:   BP (!) 102/50 (BP Location: Left Arm, Patient Position: Sitting, Cuff Size: Large)   Pulse 100   Ht _0  (1.753 m)   SpO2 98%   BMI 41.79 kg/m   Body mass index is 41.79 kg/m.     ASSESSMENT/ PLAN:   Diabetes type 2 with obesity, insulin-dependent  See history of present illness for detailed discussion of current diabetes management, blood sugar patterns and problems identified  Previously on OMNIPOD insulin pump with U-500 insulin with excellent control Now recently after hospital discharge on 70 units Lantus a day and low-dose mealtime insulin Blood sugars are averaging twice a 44 previously with the Humulin R U-500  His A1c is 7% which is still not increasing much  Discussed that we will need to get him back on the insulin pump as before with previous basal rates. His pump was checked for the correct settings and he will go back to his usual treatment He will let us know if he has any difficulties However he can reduce his boluses by 2 units for now He needs to go back to the freestyle libre sensor also  Blood pressure is low normal but he is not orthostatic with symptoms and he can continue to monitor at home   Patient Instructions   4 units for lunch and 6 units dinner, 30 minbefore the meal         Elayne Snare 11/27/2018, 3:27 PM   Note: This office note was prepared with Dragon voice recognition system technology. Any transcriptional errors that result from this process are unintentional.

## 2018-12-04 ENCOUNTER — Ambulatory Visit (INDEPENDENT_AMBULATORY_CARE_PROVIDER_SITE_OTHER): Payer: Medicaid Other | Admitting: Physician Assistant

## 2018-12-04 ENCOUNTER — Other Ambulatory Visit: Payer: Self-pay

## 2018-12-04 ENCOUNTER — Encounter: Payer: Self-pay | Admitting: Orthopedic Surgery

## 2018-12-04 VITALS — Ht 69.0 in | Wt 283.0 lb

## 2018-12-04 DIAGNOSIS — E1142 Type 2 diabetes mellitus with diabetic polyneuropathy: Secondary | ICD-10-CM

## 2018-12-04 DIAGNOSIS — Z89611 Acquired absence of right leg above knee: Secondary | ICD-10-CM

## 2018-12-04 DIAGNOSIS — Z794 Long term (current) use of insulin: Secondary | ICD-10-CM

## 2018-12-04 DIAGNOSIS — I87323 Chronic venous hypertension (idiopathic) with inflammation of bilateral lower extremity: Secondary | ICD-10-CM

## 2018-12-04 MED ORDER — DOXYCYCLINE HYCLATE 100 MG PO CAPS: 100.0000 mg | ORAL_CAPSULE | Freq: Two times a day (BID) | ORAL | 1 refills | Status: DC

## 2018-12-04 NOTE — Progress Notes (Signed)
Office Visit Note   Patient: Ethan Campbell           Date of Birth: Oct 21, 1960           MRN: [id number] Visit Date: 12/04/2018              Requested by: Arsenio Katz, NP [place], [place] [id number] PCP: Arsenio Katz, NP  Chief Complaint  Patient presents with  . Right Leg - Routine Post Op    11/08/48 revision right AKA      HPI: The patient is a 58 year old gentleman who is seen for postoperative follow-up following revision of a right below the knee amputation on 10/30/2018.  He reports that his new shrinker stocking also keeps coming off and he is going to work with [employer] clinic to obtain a shrinker stocking with a waistband to see if this stays on better.  He reports he is very pleased with his progress and has been walking up to 70 feet with a walker and going outside and walking with assistance with physical therapy.  Assessment & Plan: Visit Diagnoses:  1. Acquired absence of right lower extremity above knee ([place])   2. Type 2 diabetes mellitus with diabetic polyneuropathy, with long-term current use of insulin ([place])   3. Chronic venous hypertension (idiopathic) with inflammation of bilateral lower extremity     Plan: Sutures and staples harvested this visit.  Doxycycline 100 mg BID x 2 weeks.  Dry dressing to the incisional area until he is able to obtain a shrinker with a belt.  Follow up with [employer] concerning shrinker with waist band as others coming off.  Follow up in 2 weeks.   Follow-Up Instructions: Return in about 2 weeks (around 12/18/2018).   Ortho Exam  Patient is alert, oriented, no adenopathy, well-dressed, normal affect, normal respiratory effort. The right above-the-knee amputation site is clean dry and intact with staples and sutures in place.  These will be removed this visit.  He does have some mild localized erythema and will start antibiotics for coverage.    Imaging: No results found. No images are attached to the encounter.  Labs:  Lab Results  Component Value Date   HGBA1C 7.0 (A) 11/27/2018   HGBA1C 6.9 (A) 06/15/2018   HGBA1C 10.9 (A) 02/21/2018   ESRSEDRATE 130 (H) 10/22/2018   CRP 15.1 (H) 10/22/2018   REPTSTATUS 11/14/2018 FINAL 11/09/2018   GRAMSTAIN  11/09/2018    FEW WBC PRESENT, PREDOMINANTLY PMN NO ORGANISMS SEEN    CULT  11/09/2018    No growth aerobically or anaerobically. Performed at [facility], [place] [address] [place], [place] [address]      Lab Results  Component Value Date   ALBUMIN 1.6 (L) 11/07/2018   ALBUMIN 1.5 (L) 10/29/2018   ALBUMIN 2.4 (L) 10/22/2018    Body mass index is 41.79 kg/m.  Orders:  No orders of the defined types were placed in this encounter.  Meds ordered this encounter  Medications  . doxycycline (VIBRAMYCIN) 100 MG capsule    Sig: Take 1 capsule (100 mg total) by mouth 2 (two) times daily.    Dispense:  28 capsule    Refill:  1     Procedures: No procedures performed  Clinical Data: No additional findings.  ROS:  All other systems negative, except as noted in the HPI. Review of Systems  Objective: Vital Signs: Ht _0  (1.753 m)   Wt 283 lb (128.4 kg)   BMI  41.79 kg/m   Specialty Comments:  No specialty comments available.  PMFS History: Patient Active Problem List   Diagnosis Date Noted  . S/P AKA (above knee amputation) unilateral, left ([place]) 11/09/2018  . Hematoma of amputation stump of left lower extremity ([place]) 11/06/2018  . Hematoma of amputation stump of right lower extremity ([place])   . Anticoagulants causing adverse effect in therapeutic use   . Dehiscence of amputation stump ([place])   . Severe protein-calorie malnutrition ([place])   . Uncontrolled type 2 diabetes mellitus with polyneuropathy ([place]) 10/22/2018  . Normocytic anemia 10/22/2018  . Sepsis ([place]) 10/22/2018  . Wound infection after surgery 10/22/2018  . DVT (deep venous thrombosis) ([place]) 10/22/2018  . Hypothyroidism 10/22/2018  . Close Exposure to Covid-19  Virus 10/22/2018  . Below-knee amputation of right lower extremity ([place]) 10/05/2018  . Gangrene of right foot ([place])   . Amputated toe, right ([place]) 09/26/2018  . Subacute osteomyelitis, right ankle and foot ([place])   . Osteomyelitis of great toe of right foot ([place]) 06/19/2018  . Diabetic polyneuropathy associated with type 2 diabetes mellitus ([place]) 06/19/2018  . Body mass index 45.0-49.9, adult ([place]) 06/19/2018  . Seizure disorder ([place]) 08/27/2015  . Somnolence, daytime 08/27/2015  . Orthostatic hypotension 11/27/2014  . HLD (hyperlipidemia) 11/27/2014  . Syncope and collapse 08/20/2014  . Diabetic autonomic neuropathy associated with type 2 diabetes mellitus ([place]) 05/01/2014  . Dizzy spells 03/17/2014  . Acute renal failure superimposed on stage 3 chronic kidney disease ([place]) 08/22/2013  . Type II diabetes mellitus, uncontrolled ([place]) 12/03/2009  . HYPERLIPIDEMIA 12/03/2009  . Morbid obesity ([place]) 12/03/2009  . OBSTRUCTIVE SLEEP APNEA 12/03/2009  . Essential hypertension, benign 12/03/2009  . EDEMA 12/03/2009  . PRECORDIAL PAIN 12/03/2009  . PROTEINURIA 12/03/2009   Past Medical History:  Diagnosis Date  . Arm vein blood clot, left    on Coumadin  . Arthritis   . Cancer ([place])    Bone cancer 1987 (in knee Large cell tumor)  . Chronic kidney disease    stage 3  . Depression   . Essential hypertension   . Family history of adverse reaction to anesthesia    " my mother woke up during a cardiac surgery "  . History of stroke   . Hyperlipidemia   . Hypothyroidism   . Insomnia   . Obesity   . Precordial pain June 2011   Nuclear stress; no ischemia; EF 60%  . Presence of permanent cardiac pacemaker   . Proteinuria   . Seizure disorder ([place])   . Sick sinus syndrome ([place])   . Sleep apnea    Dr. Brandon Melnick, uses bipap  . Syncope   . Type 2 diabetes mellitus ([place])   . Venous insufficiency     Family History  Problem Relation Age of Onset  . Heart attack Mother 60  . Breast  cancer Mother   . Stroke Mother   . Heart attack Father 78  . Breast cancer Sister   . Colon cancer Sister     Past Surgical History:  Procedure Laterality Date  . AMPUTATION Right 06/20/2018   Procedure: RIGHT GREAT TOE AMPUTATION;  Surgeon: Newt Minion, MD;  Location: [facility];  Service: Orthopedics;  Laterality: Right;  . AMPUTATION Right 09/26/2018   Procedure: RIGHT FOOT 5TH RAY AMPUTATION;  Surgeon: Newt Minion, MD;  Location: [facility];  Service: Orthopedics;  Laterality: Right;  MAC and regional anesthesia  . AMPUTATION Right 10/05/2018   Procedure: RIGHT BELOW  KNEE AMPUTATION;  Surgeon: Newt Minion, MD;  Location: [facility] [place];  Service: Orthopedics;  Laterality: Right;  . AMPUTATION Right 10/26/2018   Procedure: RIGHT ABOVE KNEE AMPUTATION;  Surgeon: Newt Minion, MD;  Location: [facility];  Service: Orthopedics;  Laterality: Right;  . COLONOSCOPY    . RESECTION BONE TUMOR FEMUR  1980's   Left femur, treated at [facility] with bone graft  . STUMP REVISION Right 11/09/2018   Procedure: REVISION RIGHT ABOVE KNEE AMPUTATION;  Surgeon: Newt Minion, MD;  Location: [place];  Service: Orthopedics;  Laterality: Right;  . TOTAL KNEE ARTHROPLASTY Left 2013   Social History   Occupational History  . Occupation: Disabled  Tobacco Use  . Smoking status: Former Smoker    Types: Cigarettes  . Smokeless tobacco: Never Used  . Tobacco comment: 11/27/14 "quit smoking years ago"  Substance and Sexual Activity  . Alcohol use: No    Alcohol/week: 0.0 standard drinks  . Drug use: No  . Sexual activity: Not Currently

## 2018-12-06 ENCOUNTER — Other Ambulatory Visit: Payer: Self-pay

## 2018-12-06 ENCOUNTER — Encounter: Payer: Self-pay | Admitting: Orthopedic Surgery

## 2018-12-06 ENCOUNTER — Ambulatory Visit (INDEPENDENT_AMBULATORY_CARE_PROVIDER_SITE_OTHER): Payer: Medicaid Other | Admitting: Orthopedic Surgery

## 2018-12-06 VITALS — Ht 69.0 in | Wt 283.0 lb

## 2018-12-06 DIAGNOSIS — Z89611 Acquired absence of right leg above knee: Secondary | ICD-10-CM

## 2018-12-06 DIAGNOSIS — I87323 Chronic venous hypertension (idiopathic) with inflammation of bilateral lower extremity: Secondary | ICD-10-CM

## 2018-12-06 DIAGNOSIS — Z794 Long term (current) use of insulin: Secondary | ICD-10-CM

## 2018-12-06 DIAGNOSIS — E1142 Type 2 diabetes mellitus with diabetic polyneuropathy: Secondary | ICD-10-CM

## 2018-12-06 NOTE — Progress Notes (Signed)
Office Visit Note   Patient: Ethan Campbell           Date of Birth: [birth year]           MRN: [id number] Visit Date: 12/06/2018              Requested by: Arsenio Katz, NP [place], [place] [id number] PCP: Arsenio Katz, NP  Chief Complaint  Patient presents with  . Right Leg - Routine Post Op    11/09/2018 revision Right AKA Patient c/o blisters on site & drainage      HPI: The patient is a 58 year old gentleman who is seen for postoperative follow-up following revision surgery with a right above-the-knee amputation on 10/30/2018.  He had his sutures and staples removed the last week and Steri-Strips were applied and he developed some blistering over the areas of Steri-Strips.  He was noting some increased swelling and was unable to utilize his shrinker stocking due to the swelling.  He is on doxycycline 100 mg p.o. twice daily.  Assessment & Plan: Visit Diagnoses:  1. 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 Type 2 diabetes mellitus with diabetic polyneuropathy, with long-term current use of insulin ([place])   3. Chronic venous hypertension (idiopathic) with inflammation of bilateral lower extremity     Plan:Continue doxycycline 100 mg BID, wash limb with soap and water and dry dressing daily. Continue working with [employer] clinic for shrinker stockings with a belt.  Follow-up next week.  Follow-Up Instructions: Return in about 1 week (around 12/13/2018).   Ortho Exam  Patient is alert, oriented, no adenopathy, well-dressed, normal affect, normal respiratory effort. He has developed blistering where he had Steri-Strips in place and these were removed.  The erythema appears to be improved overall.  There is some slight widening of the incisional area laterally, with scant serous appearing drainage.  Imaging: No results found.   Labs: Lab Results  Component Value Date   HGBA1C 7.0 (A) 11/27/2018   HGBA1C 6.9 (A) 06/15/2018   HGBA1C 10.9 (A)  02/21/2018   ESRSEDRATE 130 (H) 10/22/2018   CRP 15.1 (H) 10/22/2018   REPTSTATUS 11/14/2018 FINAL 11/09/2018   GRAMSTAIN  11/09/2018    FEW WBC PRESENT, PREDOMINANTLY PMN NO ORGANISMS SEEN    CULT  11/09/2018    No growth aerobically or anaerobically. Performed at [facility], [place] [address] [place], [place] [address]      Lab Results  Component Value Date   ALBUMIN 1.6 (L) 11/07/2018   ALBUMIN 1.5 (L) 10/29/2018   ALBUMIN 2.4 (L) 10/22/2018    Body mass index is 41.79 kg/m.  Orders:  No orders of the defined types were placed in this encounter.  No orders of the defined types were placed in this encounter.    Procedures: No procedures performed  Clinical Data: No additional findings.  ROS:  All other systems negative, except as noted in the HPI. Review of Systems  Objective: Vital Signs: Ht _0  (1.753 m)   Wt 283 lb (128.4 kg)   BMI 41.79 kg/m   Specialty Comments:  No specialty comments available.  PMFS History: Patient Active Problem List   Diagnosis Date Noted  . S/P AKA (above knee amputation) unilateral, left ([place]) 11/09/2018  . Hematoma of amputation stump of left lower extremity ([place]) 11/06/2018  . Hematoma of amputation stump of right lower extremity ([place])   . Anticoagulants causing adverse effect in therapeutic use   .  Dehiscence of amputation stump ([place])   . Severe protein-calorie malnutrition ([place])   . Uncontrolled type 2 diabetes mellitus with polyneuropathy ([place]) 10/22/2018  . Normocytic anemia 10/22/2018  . Sepsis ([place]) 10/22/2018  . Wound infection after surgery 10/22/2018  . DVT (deep venous thrombosis) ([place]) 10/22/2018  . Hypothyroidism 10/22/2018  . Close Exposure to Covid-19 Virus 10/22/2018  . Below-knee amputation of right lower extremity ([place]) 10/05/2018  . Gangrene of right foot ([place])   . Amputated toe, right ([place]) 09/26/2018  . Subacute osteomyelitis, right ankle and foot ([place])   . Osteomyelitis of great toe of  right foot ([place]) 06/19/2018  . Diabetic polyneuropathy associated with type 2 diabetes mellitus ([place]) 06/19/2018  . Body mass index 45.0-49.9, adult ([place]) 06/19/2018  . Seizure disorder ([place]) 08/27/2015  . Somnolence, daytime 08/27/2015  . Orthostatic hypotension 11/27/2014  . HLD (hyperlipidemia) 11/27/2014  . Syncope and collapse 08/20/2014  . Diabetic autonomic neuropathy associated with type 2 diabetes mellitus ([place]) 05/01/2014  . Dizzy spells 03/17/2014  . Acute renal failure superimposed on stage 3 chronic kidney disease ([place]) 08/22/2013  . Type II diabetes mellitus, uncontrolled ([place]) 12/03/2009  . HYPERLIPIDEMIA 12/03/2009  . Morbid obesity ([place]) 12/03/2009  . OBSTRUCTIVE SLEEP APNEA 12/03/2009  . Essential hypertension, benign 12/03/2009  . EDEMA 12/03/2009  . PRECORDIAL PAIN 12/03/2009  . PROTEINURIA 12/03/2009   Past Medical History:  Diagnosis Date  . Arm vein blood clot, left    on Coumadin  . Arthritis   . Cancer ([place])    Bone cancer 1987 (in knee Large cell tumor)  . Chronic kidney disease    stage 3  . Depression   . Essential hypertension   . Family history of adverse reaction to anesthesia    " my mother woke up during a cardiac surgery "  . History of stroke   . Hyperlipidemia   . Hypothyroidism   . Insomnia   . Obesity   . Precordial pain June 2011   Nuclear stress; no ischemia; EF 60%  . Presence of permanent cardiac pacemaker   . Proteinuria   . Seizure disorder ([place])   . Sick sinus syndrome ([place])   . Sleep apnea    Dr. Brandon Melnick, uses bipap  . Syncope   . Type 2 diabetes mellitus ([place])   . Venous insufficiency     Family History  Problem Relation Age of Onset  . Heart attack Mother 72  . Breast cancer Mother   . Stroke Mother   . Heart attack Father 54  . Breast cancer Sister   . Colon cancer Sister     Past Surgical History:  Procedure Laterality Date  . AMPUTATION Right 06/20/2018   Procedure: RIGHT GREAT TOE AMPUTATION;  Surgeon:  Newt Minion, MD;  Location: [facility];  Service: Orthopedics;  Laterality: Right;  . AMPUTATION Right 09/26/2018   Procedure: RIGHT FOOT 5TH RAY AMPUTATION;  Surgeon: Newt Minion, MD;  Location: [facility];  Service: Orthopedics;  Laterality: Right;  MAC and regional anesthesia  . AMPUTATION Right 10/05/2018   Procedure: RIGHT BELOW KNEE AMPUTATION;  Surgeon: Newt Minion, MD;  Location: [facility];  Service: Orthopedics;  Laterality: Right;  . AMPUTATION Right 10/26/2018   Procedure: RIGHT ABOVE KNEE AMPUTATION;  Surgeon: Newt Minion, MD;  Location: [facility];  Service: Orthopedics;  Laterality: Right;  . COLONOSCOPY    . RESECTION BONE TUMOR FEMUR  1980's   Left femur, [facility] at [facility] with bone graft  .  STUMP REVISION Right 11/09/2018   Procedure: REVISION RIGHT ABOVE KNEE AMPUTATION;  Surgeon: Newt Minion, MD;  Location: [facility];  Service: Orthopedics;  Laterality: Right;  . TOTAL KNEE ARTHROPLASTY Left 2013   Social History   Occupational History  . Occupation: Disabled  Tobacco Use  . Smoking status: Former Smoker    Types: Cigarettes  . Smokeless tobacco: Never Used  . Tobacco comment: 11/27/14 "quit smoking years ago"  Substance and Sexual Activity  . Alcohol use: No    Alcohol/week: 0.0 standard drinks  . Drug use: No  . Sexual activity: Not Currently

## 2018-12-09 ENCOUNTER — Encounter: Payer: Self-pay | Admitting: Orthopedic Surgery

## 2018-12-18 ENCOUNTER — Other Ambulatory Visit: Payer: Self-pay

## 2018-12-18 ENCOUNTER — Ambulatory Visit (INDEPENDENT_AMBULATORY_CARE_PROVIDER_SITE_OTHER): Payer: Medicaid Other | Admitting: Orthopedic Surgery

## 2018-12-18 ENCOUNTER — Encounter: Payer: Self-pay | Admitting: Orthopedic Surgery

## 2018-12-18 VITALS — Ht 69.0 in | Wt 283.0 lb

## 2018-12-18 DIAGNOSIS — Z794 Long term (current) use of insulin: Secondary | ICD-10-CM

## 2018-12-18 DIAGNOSIS — Z89611 Acquired absence of right leg above knee: Secondary | ICD-10-CM

## 2018-12-18 DIAGNOSIS — E1142 Type 2 diabetes mellitus with diabetic polyneuropathy: Secondary | ICD-10-CM

## 2018-12-18 DIAGNOSIS — I87323 Chronic venous hypertension (idiopathic) with inflammation of bilateral lower extremity: Secondary | ICD-10-CM

## 2018-12-18 NOTE — Progress Notes (Signed)
Office Visit Note   Patient: Ethan Campbell           Date of Birth: Nov 13, 1960           MRN: [id number] Visit Date: 12/18/2018              Requested by: Arsenio Katz, NP [place], [place] [address] PCP: Arsenio Katz, NP  Chief Complaint  Patient presents with  . Right Leg - Routine Post Op    11/09/18 right leg BKA      HPI: The patient is a 58 year old gentleman who is seen for postoperative follow-up following revision surgery of his right above-the-knee amputation on 10/30/2018.  He had some blistering following some Steri-Strip application and these were removed last week and the area is much improved.  He continues on doxycycline 100 mg p.o. twice daily.  He is going to [facility]otech for follow-up as he has been unable to find a shrinker stocking that fit properly and they have now ordered something for him.  His blood sugars are doing much better now that he is back on his usual insulin pump.  Assessment & Plan: Visit Diagnoses:  1. 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 Type 2 diabetes mellitus with diabetic polyneuropathy, with long-term current use of insulin ([place])   3. Chronic venous hypertension (idiopathic) with inflammation of bilateral lower extremity     Plan: Continue doxycycline until course completed.  Follow-up with [facility] concerning an appropriate shrinker stocking.  He will follow-up here in 4 weeks for recheck or sooner should he have difficulties in the interim.  Follow-Up Instructions: Return in about 4 weeks (around 01/15/2019).   Ortho Exam  Patient is alert, oriented, no adenopathy, well-dressed, normal affect, normal respiratory effort. The blistering over the right above-the-knee amputation is completely resolved and the small incisional dehiscence is now crusted over and appears to be healing well.  There are no signs of cellulitis or infection.  Imaging: No results found.   Labs: Lab Results  Component Value Date    HGBA1C 7.0 (A) 11/27/2018   HGBA1C 6.9 (A) 06/15/2018   HGBA1C 10.9 (A) 02/21/2018   ESRSEDRATE 130 (H) 10/22/2018   CRP 15.1 (H) 10/22/2018   REPTSTATUS 11/14/2018 FINAL 11/09/2018   GRAMSTAIN  11/09/2018    FEW WBC PRESENT, PREDOMINANTLY PMN NO ORGANISMS SEEN    CULT  11/09/2018    No growth aerobically or anaerobically. Performed at [facility], [place] [address] [place], [place] [address]      Lab Results  Component Value Date   ALBUMIN 1.6 (L) 11/07/2018   ALBUMIN 1.5 (L) 10/29/2018   ALBUMIN 2.4 (L) 10/22/2018    Body mass index is 41.79 kg/m.  Orders:  No orders of the defined types were placed in this encounter.  No orders of the defined types were placed in this encounter.    Procedures: No procedures performed  Clinical Data: No additional findings.  ROS:  All other systems negative, except as noted in the HPI. Review of Systems  Objective: Vital Signs: Ht _0  (1.753 m)   Wt 283 lb (128.4 kg)   BMI 41.79 kg/m   Specialty Comments:  No specialty comments available.  PMFS History: Patient Active Problem List   Diagnosis Date Noted  . S/P AKA (above knee amputation) unilateral, left ([place]) 11/09/2018  . Hematoma of amputation stump of left lower extremity ([place]) 11/06/2018  . Hematoma of amputation stump of  right lower extremity ([place])   . Anticoagulants causing adverse effect in therapeutic use   . Dehiscence of amputation stump ([place])   . Severe protein-calorie malnutrition ([place])   . Uncontrolled type 2 diabetes mellitus with polyneuropathy ([place]) 10/22/2018  . Normocytic anemia 10/22/2018  . Sepsis ([place]) 10/22/2018  . Wound infection after surgery 10/22/2018  . DVT (deep venous thrombosis) ([place]) 10/22/2018  . Hypothyroidism 10/22/2018  . Close Exposure to Covid-19 Virus 10/22/2018  . Below-knee amputation of right lower extremity ([place]) 10/05/2018  . Gangrene of right foot ([place])   . Amputated toe, right ([place]) 09/26/2018  . Subacute  osteomyelitis, right ankle and foot ([place])   . Osteomyelitis of great toe of right foot ([place]) 06/19/2018  . Diabetic polyneuropathy associated with type 2 diabetes mellitus ([place]) 06/19/2018  . Body mass index 45.0-49.9, adult ([place]) 06/19/2018  . Seizure disorder ([place]) 08/27/2015  . Somnolence, daytime 08/27/2015  . Orthostatic hypotension 11/27/2014  . HLD (hyperlipidemia) 11/27/2014  . Syncope and collapse 08/20/2014  . Diabetic autonomic neuropathy associated with type 2 diabetes mellitus ([place]) 05/01/2014  . Dizzy spells 03/17/2014  . Acute renal failure superimposed on stage 3 chronic kidney disease ([place]) 08/22/2013  . Type II diabetes mellitus, uncontrolled ([place]) 12/03/2009  . HYPERLIPIDEMIA 12/03/2009  . Morbid obesity ([place]) 12/03/2009  . OBSTRUCTIVE SLEEP APNEA 12/03/2009  . Essential hypertension, benign 12/03/2009  . EDEMA 12/03/2009  . PRECORDIAL PAIN 12/03/2009  . PROTEINURIA 12/03/2009   Past Medical History:  Diagnosis Date  . Arm vein blood clot, left    on Coumadin  . Arthritis   . Cancer ([place])    Bone cancer 1987 (in knee Large cell tumor)  . Chronic kidney disease    stage 3  . Depression   . Essential hypertension   . Family history of adverse reaction to anesthesia    " my mother woke up during a cardiac surgery "  . History of stroke   . Hyperlipidemia   . Hypothyroidism   . Insomnia   . Obesity   . Precordial pain June 2011   Nuclear stress; no ischemia; EF 60%  . Presence of permanent cardiac pacemaker   . Proteinuria   . Seizure disorder ([place])   . Sick sinus syndrome ([place])   . Sleep apnea    Dr. Brandon Melnick, uses bipap  . Syncope   . Type 2 diabetes mellitus ([place])   . Venous insufficiency     Family History  Problem Relation Age of Onset  . Heart attack Mother 7  . Breast cancer Mother   . Stroke Mother   . Heart attack Father 81  . Breast cancer Sister   . Colon cancer Sister     Past Surgical History:  Procedure Laterality Date  .  AMPUTATION Right 06/20/2018   Procedure: RIGHT GREAT TOE AMPUTATION;  Surgeon: Newt Minion, MD;  Location: [facility];  Service: Orthopedics;  Laterality: Right;  . AMPUTATION Right 09/26/2018   Procedure: RIGHT FOOT 5TH RAY AMPUTATION;  Surgeon: Newt Minion, MD;  Location: [facility];  Service: Orthopedics;  Laterality: Right;  MAC and regional anesthesia  . AMPUTATION Right 10/05/2018   Procedure: RIGHT BELOW KNEE AMPUTATION;  Surgeon: Newt Minion, MD;  Location: [facility];  Service: Orthopedics;  Laterality: Right;  . AMPUTATION Right 10/26/2018   Procedure: RIGHT ABOVE KNEE AMPUTATION;  Surgeon: Newt Minion, MD;  Location: [facility];  Service: Orthopedics;  Laterality: Right;  . COLONOSCOPY    .  RESECTION BONE TUMOR FEMUR  1980's   Left femur, [facility] at [facility] with bone graft  . STUMP REVISION Right 11/09/2018   Procedure: REVISION RIGHT ABOVE KNEE AMPUTATION;  Surgeon: Newt Minion, MD;  Location: [facility];  Service: Orthopedics;  Laterality: Right;  . TOTAL KNEE ARTHROPLASTY Left 2013   Social History   Occupational History  . Occupation: Disabled  Tobacco Use  . Smoking status: Former Smoker    Types: Cigarettes  . Smokeless tobacco: Never Used  . Tobacco comment: 11/27/14 "quit smoking years ago"  Substance and Sexual Activity  . Alcohol use: No    Alcohol/week: 0.0 standard drinks  . Drug use: No  . Sexual activity: Not Currently

## 2018-12-22 ENCOUNTER — Telehealth: Payer: Self-pay | Admitting: Cardiology

## 2018-12-22 NOTE — Telephone Encounter (Signed)
Patient asleep.  Spoke to [facility]ley Hyler-emergency contact regarding patients recent office visit 5/26 and the possible Covid exposure.  Supplied number [phone] if patient wished to be tested her could call this number back.

## 2019-01-08 ENCOUNTER — Ambulatory Visit: Payer: Medicaid Other | Admitting: Endocrinology

## 2019-01-15 ENCOUNTER — Ambulatory Visit: Payer: Medicaid Other | Admitting: Orthopedic Surgery

## 2019-02-19 ENCOUNTER — Ambulatory Visit (INDEPENDENT_AMBULATORY_CARE_PROVIDER_SITE_OTHER): Payer: Medicaid Other | Admitting: Endocrinology

## 2019-02-19 ENCOUNTER — Encounter: Payer: Self-pay | Admitting: Endocrinology

## 2019-02-19 ENCOUNTER — Other Ambulatory Visit: Payer: Self-pay

## 2019-02-19 VITALS — BP 110/60 | HR 65 | Ht 69.0 in

## 2019-02-19 DIAGNOSIS — Z794 Long term (current) use of insulin: Secondary | ICD-10-CM

## 2019-02-19 DIAGNOSIS — E039 Hypothyroidism, unspecified: Secondary | ICD-10-CM | POA: Diagnosis not present

## 2019-02-19 DIAGNOSIS — E1165 Type 2 diabetes mellitus with hyperglycemia: Secondary | ICD-10-CM

## 2019-02-19 LAB — POCT GLYCOSYLATED HEMOGLOBIN (HGB A1C): Hemoglobin A1C: 8.7 % — AB (ref 4.0–5.6)

## 2019-02-19 LAB — GLUCOSE, POCT (MANUAL RESULT ENTRY): POC Glucose: 133 mg/dl — AB (ref 70–99)

## 2019-02-19 NOTE — Progress Notes (Signed)
Patient ID: Ethan Campbell, male   DOB: [birth year], 58 y.o.   MRN: [id number]   Reason for Appointment: follow-up   History of Present Illness   Diagnosis: Type 2 DIABETES MELITUS, date of diagnosis: 2000    Previous history: he has been on insulin for several years with consistently poor control Has been requiring large doses of insulin for his diabetes but A1c has been persistently high Blood sugars did not improve significantly even with trying Byetta and Victoza In 2014 he was switched from NovoLog to U-500 insulin but not clear if he has had improvement in control except with fasting readings His prior A1c was 14.0 in 5/14 and he had educational discussions with diabetes educator and dietitian in 6/14   He was started on the OMNIPOD pump on 02/06/2018  Recent history:   Non-insulin hypoglycemic drugs: Victoza 1.8 mg daily   Omnipod PUMP settings:   Basal rate: Midnight =0.4, 12 noon = 0.9 and 9 PM = 0.75 Insulin: Humulin R U-500  Preset boluses: 4-7 units  A1c is last 7% compared to 6.9   Current management, blood sugar patterns and problems identified:  Since his last visit in 5/20 he has been back on the insulin pump and his basal rate in the afternoon has been increased  He is checking his blood sugars with the freestyle libre but very erratically as discussed below  In the last 3 weeks he has had PREDNISONE for Bell's palsy and his blood sugars have been very erratic  He had his right above knee amputation he has not been using insulin pump and has not been allowed to use his pump during and after his hospitalizations  With this his blood sugars are poorly controlled  Previously had excellent control overall with previous average blood sugar about 160 and now about 330  His last surgery was on 11/09/2018  He did not call after his discharge to report that he had higher blood sugars with taking Lantus and NovoLog  Blood sugars are averaging nearly  300 fasting and only slightly better in the afternoon, highest readings are late evening  As before mostly eating 2 meals a day and occasionally breakfast also  Prior to surgery he had started using the freestyle libre sensor and had no difficulties with this, has not started back on this   Mealtimes: 12 pm, 6 pm  Proper timing of medications in relation to meals: Yes, usually 30 minutes before eating .          Monitors blood glucose:  2-  4 times a day.    Glucometer: Freestyle libre   CONTINUOUS GLUCOSE MONITORING RECORD INTERPRETATION    Dates of Recording: 7/15 through 7/28  Sensor description: [employer]  Results statistics:   CGM use % of time  62  Average and GV  213+/-49  Time in range     33   %  % Time Above 180  27  % Time above 250  33  % Time Below target  7    Glycemic patterns summary:  His blood sugar readings are highly variable with no consistent pattern Due to lack of blood sugar monitoring he has only sporadic data from day-to-day especially in the last 7 days his blood sugar data is a little more complete He has blood sugars above the target range on average throughout the day with more consistently high readings between about 2-7 PM  Hyperglycemic episodes    No consistent  pattern except for blood sugar rising generally between 1 PM-7 PM Blood sugars are above the target range most of the time as discussed above Blood sugars may be rising over 350 on a few occasions Blood sugar has not been appearing to be higher since about midday on Sunday however   Hypoglycemic episodes are occurring to variable degree at all different times including overnight He had low sugars through the night on Monday, low normal reading last night early morning and also low sugar after midnight on 7/24 Blood sugar was also relatively low around 6-8 PM on 2 occasions   Overnight periods: Highly variable with blood sugars either low or sometimes as high as 330   Preprandial periods: Quite variable as above  Postprandial periods:   Difficulty judging his postprandial readings since he is bolusing Medbery erratic times and sometimes only once a day at different times May be getting higher readings after his mid afternoon meal    PRE-MEAL Fasting  12-2 PM  4-6 PM Bedtime Overall  Glucose range:       Mean/median:  218  196  224  233  213   POST-MEAL PC Breakfast PC Lunch PC Dinner  Glucose range:     Mean/median:    231    Previous blood sugar average 333  Physical activity: exercise:none   Last dietitian visit: 6/18 and last CDE visit in 4/16  Wt Readings from Last 3 Encounters:  12/18/18 283 lb (128.4 kg)  12/06/18 283 lb (128.4 kg)  12/04/18 283 lb (128.4 kg)    LABS:   Lab Results  Component Value Date   HGBA1C 7.0 (A) 11/27/2018   HGBA1C 6.9 (A) 06/15/2018   HGBA1C 10.9 (A) 02/21/2018   Lab Results  Component Value Date   MICROALBUR 1.6 11/06/2017   LDLCALC 61 11/06/2017   CREATININE 1.17 11/12/2018    Lab Results  Component Value Date   FRUCTOSAMINE 250 08/15/2018   FRUCTOSAMINE 512 (H) 11/06/2017   FRUCTOSAMINE 494 (H) 12/22/2016    OTHER active problems are discussed in review of systems   Allergies as of 02/19/2019   No Known Allergies     Medication List       Accurate as of February 19, 2019  4:09 PM. If you have any questions, ask your nurse or doctor.        acetaminophen 325 MG tablet Commonly known as: TYLENOL Take 650 mg by mouth every 4 (four) hours as needed for mild pain.   aspirin 81 MG EC tablet Take 1 tablet (81 mg total) by mouth 2 (two) times a day.   atorvastatin 80 MG tablet Commonly known as: LIPITOR Take 1 tablet (80 mg total) by mouth daily.   blood glucose meter kit and supplies Dispense based on patient and insurance preference. Use up to four times daily as directed. (FOR ICD-10 E10.9, E11.9).   carvedilol 6.25 MG tablet Commonly known as: COREG Take 1 tablet (6.25 mg  total) by mouth 2 (two) times daily with a meal.   doxycycline 100 MG capsule Commonly known as: VIBRAMYCIN Take 1 capsule (100 mg total) by mouth 2 (two) times daily.   DULoxetine 60 MG capsule Commonly known as: CYMBALTA Take 1 capsule (60 mg total) by mouth daily.   fenofibrate 145 MG tablet Commonly known as: TRICOR Take 1 tablet (145 mg total) by mouth daily.   ferrous sulfate 325 (65 FE) MG tablet Take 1 tablet (325 mg total) by mouth 3 (three) times  daily with meals for 30 days.   freestyle lancets Use as instructed   furosemide 40 MG tablet Commonly known as: LASIX Take 40 mg by mouth See admin instructions. Take 74m by mouth in the morning and 40 mg in the evening.   gabapentin 300 MG capsule Commonly known as: NEURONTIN Take 300 mg by mouth 3 (three) times daily. Take 3046mby mouth three times daily.   glucagon 1 MG injection Follow package directions for low blood sugar.   glucose blood test strip Commonly known as: Accu-Chek Aviva Plus TEST UP TO 4 TIMES DAILY Dx code E11.65   glucose blood test strip Commonly known as: FREESTYLE LITE Check sugar 3 times daily   HYDROcodone-acetaminophen 5-325 MG tablet Commonly known as: NORCO/VICODIN Take 1 tablet by mouth every 6 (six) hours as needed for moderate pain.   insulin aspart 100 UNIT/ML FlexPen Commonly known as: NovoLOG FlexPen Inject 8 Units into the skin 3 (three) times daily with meals for 30 days.   Insulin Glargine 100 UNIT/ML Solostar Pen Commonly known as: LANTUS Inject 35 Units into the skin 2 (two) times daily.   Insulin Pen Needle 32G X 8 MM Misc Use as directed   levothyroxine 100 MCG tablet Commonly known as: SYNTHROID Take 1 tablet (100 mcg total) by mouth daily. Take 1 tablet daily before breakfast   liraglutide 18 MG/3ML Sopn Commonly known as: Victoza Inject 0.3 mLs (1.8 mg total) into the skin daily for 30 days.   meclizine 25 MG tablet Commonly known as: ANTIVERT Take 1  tablet (25 mg total) by mouth every 8 (eight) hours as needed for dizziness.   polyethylene glycol 17 g packet Commonly known as: MIRALAX / GLYCOLAX Take 17 g by mouth daily as needed for mild constipation.       Allergies: No Known Allergies  Past Medical History:  Diagnosis Date  . Arm vein blood clot, left    on Coumadin  . Arthritis   . 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 Chronic kidney disease    stage 3  . Depression   . Essential hypertension   . Family history of adverse reaction to anesthesia    " my mother woke up during a cardiac surgery "  . History of stroke   . Hyperlipidemia   . Hypothyroidism   . Insomnia   . Obesity   . Precordial pain June 2011   Nuclear stress; no ischemia; EF 60%  . Presence of permanent cardiac pacemaker   . Proteinuria   . Seizure disorder ([facility]  . Sick sinus syndrome ([facility]  . Sleep apnea    Dr. TeBrandon Melnickuses bipap  . Syncope   . Type 2 diabetes mellitus ([facility]  . Venous insufficiency     Past Surgical History:  Procedure Laterality Date  . AMPUTATION Right 06/20/2018   Procedure: RIGHT GREAT TOE AMPUTATION;  Surgeon: DuNewt MinionMD;  [facility];  Laterality: Right;  . AMPUTATION Right 09/26/2018   Procedure: RIGHT FOOT 5TH RAY AMPUTATION;  Surgeon: DuNewt MinionMD;  [facility];  Laterality: Right;  MAC and regional anesthesia  . AMPUTATION Right 10/05/2018   Procedure: RIGHT BELOW KNEE AMPUTATION;  Surgeon: DuNewt MinionMD;  [facility]: [facility]ics;  Laterality: Right;  . AMPUTATION Right 10/26/2018   Procedure: RIGHT ABOVE KNEE AMPUTATION;  Surgeon: DuNewt Minion  MD;  Location: [place];  Service: Orthopedics;  Laterality: Right;  . COLONOSCOPY    . RESECTION BONE TUMOR FEMUR  1980's   Left femur, [facility] with bone graft  . STUMP REVISION Right 11/09/2018   Procedure: REVISION RIGHT ABOVE KNEE AMPUTATION;   Surgeon: Newt Minion, MD;  Location: [place];  Service: Orthopedics;  Laterality: Right;  . TOTAL KNEE ARTHROPLASTY Left 2013    Family History  Problem Relation Age of Onset  . Heart attack Mother 44  . Breast cancer Mother   . Stroke Mother   . Heart attack Father 43  . Breast cancer Sister   . Colon cancer Sister     Social History:  reports that he has quit smoking. His smoking use included cigarettes. He has never used smokeless tobacco. He reports that he does not drink alcohol or use drugs.  Review of Systems:    HYPOTHYROIDISM:   on  levothyroxine 100 ug daily The dose was increased in 11/19 when TSH was high on 88 mcg No unusual fatigue   Lab Results  Component Value Date   TSH 4.35 08/15/2018   SYNCOPAL episodes: None recently and he has not been very active because of amputation  Neurologist indicated that he may have vertebral artery flow issues  His antihypertensives have been stopped Recently only low-dose carvedilol has been given for hypertension  Lipids: triglycerides on labs on the last measurement are below 200  Followed by PCP   Lab Results  Component Value Date   CHOL 126 11/06/2017   HDL 34.40 (L) 11/06/2017   LDLCALC 61 11/06/2017   LDLDIRECT 80.0 03/07/2016   TRIG 155.0 (H) 11/06/2017   CHOLHDL 4 11/06/2017      CKD: His creatinine has been In the 2-3 range in the past but on the last visit was surprisingly normal He is due to see his nephrologist  Lab Results  Component Value Date   CREATININE 1.17 11/12/2018   History of CHF, taking Lasix using 80 mg tablet    Examination:   There were no vitals taken for this visit.  There is no height or weight on file to calculate BMI.     ASSESSMENT/ PLAN:   Diabetes type 2 with obesity, insulin-dependent  See history of present illness for detailed discussion of current diabetes management, blood sugar patterns and problems identified  Recently on OMNIPOD insulin pump with U-500  insulin  Because of having steroids lately his blood sugars have been markedly out of control with only 33% readings within the target range and 33% readings over 250 However is not monitoring consistently except the last few days Day-to-day management was discussed and recommendations made based on his problem list as above  Most likely since he is starting to get low sugars overnight can reduce him basal rate overnight He will need to check with his PCP to see if he can come off his prednisone which is causing high sugars He is not having any symptoms from his Bell's palsy currently  Again discussed the need to check his blood sugars at least 4 times a day He will also need to bolus for every meal and not skip any boluses Most likely needs at least 1-2 units more with his boluses for larger meals Also do boluses for blood sugars when they are high  Basal rate changes: Midnight = 0.3, 5 AM = 0.45, 12 PM = 0.95 and 9 PM = 0.75  Follow-up in 4  weeks  THYROID: We will check labs today  There are no Patient Instructions on file for this visit.        Elayne Snare 02/19/2019, 4:09 PM   Note: This office note was prepared with Dragon voice recognition system technology. Any transcriptional errors that result from this process are unintentional.

## 2019-02-20 LAB — LIPID PANEL
Cholesterol: 127 mg/dL (ref 0–200)
HDL: 43.9 mg/dL (ref >39.00)
LDL Cholesterol: 60 mg/dL (ref 0–99)
NonHDL: 82.75
Total CHOL/HDL Ratio: 3
Triglycerides: 116 mg/dL (ref 0.0–149.0)
VLDL: 23.2 mg/dL (ref 0.0–40.0)

## 2019-02-20 LAB — TSH: TSH: 3.84 u[IU]/mL (ref 0.35–4.50)

## 2019-02-20 LAB — T4, FREE: Free T4: 1.17 ng/dL (ref 0.60–1.60)

## 2019-02-20 LAB — COMPREHENSIVE METABOLIC PANEL
ALT: 33 U/L (ref 0–53)
AST: 31 U/L (ref 0–37)
Albumin: 3.4 g/dL — ABNORMAL LOW (ref 3.5–5.2)
Alkaline Phosphatase: 61 U/L (ref 39–117)
BUN: 40 mg/dL — ABNORMAL HIGH (ref 6–23)
CO2: 31 mEq/L (ref 19–32)
Calcium: 9.5 mg/dL (ref 8.4–10.5)
Chloride: 103 mEq/L (ref 96–112)
Creatinine, Ser: 1.95 mg/dL — ABNORMAL HIGH (ref 0.40–1.50)
GFR: 35.53 mL/min — ABNORMAL LOW (ref >60.00)
Glucose, Bld: 95 mg/dL (ref 70–99)
Potassium: 4.5 mEq/L (ref 3.5–5.1)
Sodium: 140 mEq/L (ref 135–145)
Total Bilirubin: 0.3 mg/dL (ref 0.2–1.2)
Total Protein: 6.1 g/dL (ref 6.0–8.3)

## 2019-02-22 NOTE — Progress Notes (Signed)
Please call to let patient know that the lab results are fairly good except kidney function moderately reduced

## 2019-04-03 ENCOUNTER — Ambulatory Visit: Payer: Medicaid Other | Admitting: Endocrinology

## 2019-04-16 ENCOUNTER — Telehealth: Payer: Self-pay | Admitting: Endocrinology

## 2019-04-16 NOTE — Telephone Encounter (Signed)
Patient is scheduled for appointment on 04/22/19

## 2019-04-16 NOTE — Telephone Encounter (Signed)
Patient ph# [phone] called re: status of paperwork that Dr. Dwyane Dee was to fill out for patient's compression stocking and diabetic shoes. [facility] has not received the paperwork yet. Please call patient at the ph# listed above to advise.

## 2019-04-16 NOTE — Telephone Encounter (Signed)
Please call pt and have him schedule an appt. In order to complete this paperwork, the pt must first be seen, per Dr. Dwyane Dee.

## 2019-04-21 NOTE — Progress Notes (Signed)
Patient ID: Ethan Campbell, male   DOB: August 12, 1960, 58 y.o.   MRN: [id number]   Reason for Appointment: follow-up   History of Present Illness   Diagnosis: Type 2 DIABETES MELITUS, date of diagnosis: 2000    Previous history: he has been on insulin for several years with consistently poor control Has been requiring large doses of insulin for his diabetes but A1c has been persistently high Blood sugars did not improve significantly even with trying Byetta and Victoza In 2014 he was switched from NovoLog to U-500 insulin but not clear if he has had improvement in control except with fasting readings His prior A1c was 14.0 in 5/14 and he had educational discussions with diabetes educator and dietitian in 6/14   He was started on the OMNIPOD pump on 02/06/2018  Recent history:   Non-insulin hypoglycemic drugs: Victoza 1.8 mg daily   Omnipod PUMP settings:   Basal rate: Midnight =0.4, 12 noon = 0.9 and 9 PM = 0.75 Insulin: Humulin R U-500  Preset boluses: 4-7 units   Current management, blood sugar patterns and problems identified:  His last A1c was 8.7 in July  He forgot to make his basal rate changes on his last visit in July  Also he ran out of his supplies for his freestyle libre about 10 days ago  Although his blood sugars are somewhat better compared to his last visit because of not taking prednisone they are still not well controlled with recent blood sugars averaging 234  He now says he forgets to bolus frequently for his meals and in the last week has not bolused at all at dinnertime  His blood sugars before his first meal are also quite variable but mostly high  With his freestyle libre data as discussed below his blood sugars were fairly consistently significantly high including readings over 300 average overnight   Mealtimes: 12 pm, 6 pm  Proper timing of medications in relation to meals: Yes, usually 30 minutes before eating .          Monitors  blood glucose:  2-  4 times a day.    Glucometer: Accu-Chek Averages from Accu-Chek:  PRE-MEAL  before breakfast Lunch Dinner Bedtime Overall  Glucose range:       Mean/median:  240  244  242  166  234+/-92      Freestyle libre CONTINUOUS GLUCOSE MONITORING RECORD INTERPRETATION    Dates of Recording: 9/3 through 9/16  Results statistics:   CGM use % of time  51  Average and GV  289+/-41  Time in range  19       %  % Time Above 180  23  % Time above 250  57  % Time Below target  1    Glycemic patterns summary: Blood sugar monitoring was inconsistent and especially data after 9 PM is not available consistently Highest blood sugars are appearing to be after midnight and also relatively higher before noon Relatively lower blood sugars are after 8 PM  Hyperglycemic episodes are occurring throughout the day and night  Hypoglycemic episodes not documented around 10 PM following hyperglycemia  Overnight periods: Blood sugars are averaging well over 300 between midnight-4 AM and then lower at 4 AM-6 AM but then trending higher again  Preprandial periods: Blood sugars are relatively high before his first meal late morning averaging 337  Postprandial periods:   Usually eating 2 meals a day with blood sugars improving after his first meal but  not to normal Glucose data incomplete after evening meal but likely somewhat better with pre-meal blood sugar around 280-290   Physical activity: exercise:none   Last dietitian visit: 6/18 and last CDE visit in 4/16  Wt Readings from Last 3 Encounters:  12/18/18 283 lb (128.4 kg)  12/06/18 283 lb (128.4 kg)  12/04/18 283 lb (128.4 kg)    LABS:   Lab Results  Component Value Date   HGBA1C 8.7 (A) 02/19/2019   HGBA1C 7.0 (A) 11/27/2018   HGBA1C 6.9 (A) 06/15/2018   Lab Results  Component Value Date   MICROALBUR 1.6 11/06/2017   LDLCALC 60 02/19/2019   CREATININE 1.95 (H) 02/19/2019    Lab Results  Component Value Date    FRUCTOSAMINE 250 08/15/2018   FRUCTOSAMINE 512 (H) 11/06/2017   FRUCTOSAMINE 494 (H) 12/22/2016    OTHER active problems are discussed in review of systems   Allergies as of 04/22/2019   No Known Allergies     Medication List       Accurate as of April 22, 2019  4:21 PM. If you have any questions, ask your nurse or doctor.        STOP taking these medications   doxycycline 100 MG capsule Commonly known as: VIBRAMYCIN Stopped by: Elayne Snare, MD     TAKE these medications   acetaminophen 325 MG tablet Commonly known as: TYLENOL Take 650 mg by mouth every 4 (four) hours as needed for mild pain.   aspirin 81 MG EC tablet Take 1 tablet (81 mg total) by mouth 2 (two) times a day.   atorvastatin 80 MG tablet Commonly known as: LIPITOR Take 1 tablet (80 mg total) by mouth daily.   blood glucose meter kit and supplies Dispense based on patient and insurance preference. Use up to four times daily as directed. (FOR ICD-10 E10.9, E11.9).   carvedilol 6.25 MG tablet Commonly known as: COREG Take 1 tablet (6.25 mg total) by mouth 2 (two) times daily with a meal.   DULoxetine 60 MG capsule Commonly known as: CYMBALTA Take 1 capsule (60 mg total) by mouth daily.   fenofibrate 145 MG tablet Commonly known as: TRICOR Take 1 tablet (145 mg total) by mouth daily.   ferrous sulfate 325 (65 FE) MG tablet Take 1 tablet (325 mg total) by mouth 3 (three) times daily with meals for 30 days.   freestyle lancets Use as instructed   furosemide 40 MG tablet Commonly known as: LASIX Take 40 mg by mouth See admin instructions. Take 46m by mouth in the morning and 40 mg in the evening.   gabapentin 300 MG capsule Commonly known as: NEURONTIN Take 300 mg by mouth 3 (three) times daily. Take 3026mby mouth three times daily.   glucagon 1 MG injection Follow package directions for low blood sugar.   glucose blood test strip Commonly known as: Accu-Chek Aviva Plus TEST UP TO 4  TIMES DAILY Dx code E11.65   glucose blood test strip Commonly known as: FREESTYLE LITE Check sugar 3 times daily   HUMULIN R 500 UNIT/ML injection Generic drug: insulin regular human CONCENTRATED Inject into the skin.   HYDROcodone-acetaminophen 5-325 MG tablet Commonly known as: NORCO/VICODIN Take 1 tablet by mouth every 6 (six) hours as needed for moderate pain.   Insulin Pen Needle 32G X 8 MM Misc Use as directed   levothyroxine 100 MCG tablet Commonly known as: SYNTHROID Take 1 tablet (100 mcg total) by mouth daily. Take 1 tablet daily  before breakfast   liraglutide 18 MG/3ML Sopn Commonly known as: Victoza Inject 0.3 mLs (1.8 mg total) into the skin daily for 30 days.   meclizine 25 MG tablet Commonly known as: ANTIVERT Take 1 tablet (25 mg total) by mouth every 8 (eight) hours as needed for dizziness.   OmniPod 10 Pack Misc by Does not apply route.   polyethylene glycol 17 g packet Commonly known as: MIRALAX / GLYCOLAX Take 17 g by mouth daily as needed for mild constipation.       Allergies: No Known Allergies  Past Medical History:  Diagnosis Date  . Arm vein blood clot, left    on Coumadin  . Arthritis   . Cancer ([place])    Bone cancer 1987 (in knee Large cell tumor)  . Chronic kidney disease    stage 3  . Depression   . Essential hypertension   . Family history of adverse reaction to anesthesia    " my mother woke up during a cardiac surgery "  . History of stroke   . Hyperlipidemia   . Hypothyroidism   . Insomnia   . Obesity   . Precordial pain June 2011   Nuclear stress; no ischemia; EF 60%  . Presence of permanent cardiac pacemaker   . Proteinuria   . Seizure disorder ([place])   . Sick sinus syndrome ([place])   . Sleep apnea    Dr. Brandon Melnick, uses bipap  . Syncope   . Type 2 diabetes mellitus ([place])   . Venous insufficiency     Past Surgical History:  Procedure Laterality Date  . AMPUTATION Right 06/20/2018   Procedure: RIGHT GREAT TOE  AMPUTATION;  Surgeon: Newt Minion, MD;  Location: [facility];  Service: Orthopedics;  Laterality: Right;  . AMPUTATION Right 09/26/2018   Procedure: RIGHT FOOT 5TH RAY AMPUTATION;  Surgeon: Newt Minion, MD;  Location: [facility];  Service: Orthopedics;  Laterality: Right;  MAC and regional anesthesia  . AMPUTATION Right 10/05/2018   Procedure: RIGHT BELOW KNEE AMPUTATION;  Surgeon: Newt Minion, MD;  Location: [facility];  Service: Orthopedics;  Laterality: Right;  . AMPUTATION Right 10/26/2018   Procedure: RIGHT ABOVE KNEE AMPUTATION;  Surgeon: Newt Minion, MD;  Location: [facility];  Service: Orthopedics;  Laterality: Right;  . COLONOSCOPY    . RESECTION BONE TUMOR FEMUR  1980's   Left femur, [facility] at [facility] with bone graft  . STUMP REVISION Right 11/09/2018   Procedure: REVISION RIGHT ABOVE KNEE AMPUTATION;  Surgeon: Newt Minion, MD;  Location: [facility];  Service: Orthopedics;  Laterality: Right;  . TOTAL KNEE ARTHROPLASTY Left 2013    Family History  Problem Relation Age of Onset  . Heart attack Mother 8  . Breast cancer Mother   . Stroke Mother   . Heart attack Father 26  . Breast cancer Sister   . Colon cancer Sister     Social History:  reports that he has quit smoking. His smoking use included cigarettes. He has never used smokeless tobacco. He reports that he does not drink alcohol or use drugs.  Review of Systems:    HYPOTHYROIDISM:   on  levothyroxine 100 ug daily The dose was increased in 11/19 when TSH was high on 88 mcg Recent fatigue not present   Lab Results  Component Value Date   TSH 3.84 02/19/2019   SYNCOPAL episodes: None recently and he has not been very active because of amputation  Neurologist indicated that he  may have vertebral artery flow issues  His antihypertensives have been stopped Now only on low-dose carvedilol by cardiologist  Lipids: triglycerides on labs on the last measurement are below 150 However still continues to take fenofibrate daily with  his renal dysfunction  Followed by PCP   Lab Results  Component Value Date   CHOL 127 02/19/2019   HDL 43.90 02/19/2019   LDLCALC 60 02/19/2019   LDLDIRECT 80.0 03/07/2016   TRIG 116.0 02/19/2019   CHOLHDL 3 02/19/2019      CKD: His creatinine has been In the 2-3 range in the past but more recently slightly better He is due to see a new nephrologist now  Lab Results  Component Value Date   CREATININE 1.95 (H) 02/19/2019   History of CHF, taking Lasix using 40 mg tablet  He has had less edema on his left leg recently however his other physician had recommended elastic stocking  Examination:   BP 122/68 (BP Location: Left Arm, Patient Position: Sitting, Cuff Size: Normal)   Pulse 79   Ht _0  (1.753 m)   SpO2 98%   BMI 41.79 kg/m   Body mass index is 41.79 kg/m.     ASSESSMENT/ PLAN:   Diabetes type 2 with obesity, insulin-dependent  See history of present illness for detailed discussion of current diabetes management, blood sugar patterns and problems identified  Recently on OMNIPOD insulin pump with U-500 insulin  Last A1c was 8.7 Fructosamine will be checked today He is still having poor control Some of his hyperglycemia is related to missed boluses However blood sugar data is incomplete with his not using the freestyle libre in the last few days and not clear at the same information as applicable especially for overnight blood sugars  Basal rates will be changed as below He he was reminded of the need to bolus for every meal and not skip any boluses especially at suppertime He can do a bolus even if he does not have a blood sugar reading available by putting in the carbohydrates  Most likely needs at least 1-2 units more with his boluses for meals with more carbohydrate or higher fat meals Also needs to do boluses for blood sugars when they are high and will be able to see his blood sugars better with the freestyle libre which hopefully he can get some   Basal rate changes: Midnight = 0.5, 10 AM = 0.4, 1 PM = 1.0 and 9 PM = 0.8  Follow-up in 8 weeks  HYPOTHYROIDISM: To have follow-up on the next visit  Lipids: To have fasting lipids on the next visit but discussed that with his renal dysfunction needs to cut back on fenofibrate to 3 times a week   Patient Instructions  Fenofibrate 3x weekly only         Elayne Snare 04/22/2019, 4:21 PM   Note: This office note was prepared with Dragon voice recognition system technology. Any transcriptional errors that result from this process are unintentional.

## 2019-04-22 ENCOUNTER — Encounter: Payer: Self-pay | Admitting: Endocrinology

## 2019-04-22 ENCOUNTER — Other Ambulatory Visit: Payer: Self-pay

## 2019-04-22 ENCOUNTER — Ambulatory Visit (INDEPENDENT_AMBULATORY_CARE_PROVIDER_SITE_OTHER): Payer: Medicaid Other | Admitting: Endocrinology

## 2019-04-22 VITALS — BP 122/68 | HR 79 | Ht 69.0 in

## 2019-04-22 DIAGNOSIS — Z794 Long term (current) use of insulin: Secondary | ICD-10-CM

## 2019-04-22 DIAGNOSIS — E1143 Type 2 diabetes mellitus with diabetic autonomic (poly)neuropathy: Secondary | ICD-10-CM | POA: Diagnosis not present

## 2019-04-22 DIAGNOSIS — E039 Hypothyroidism, unspecified: Secondary | ICD-10-CM | POA: Diagnosis not present

## 2019-04-22 DIAGNOSIS — E1165 Type 2 diabetes mellitus with hyperglycemia: Secondary | ICD-10-CM

## 2019-04-22 LAB — LIPID PANEL
Cholesterol: 114 mg/dL (ref 0–200)
HDL: 37.7 mg/dL — ABNORMAL LOW (ref >39.00)
LDL Cholesterol: 61 mg/dL (ref 0–99)
NonHDL: 76.35
Total CHOL/HDL Ratio: 3
Triglycerides: 76 mg/dL (ref 0.0–149.0)
VLDL: 15.2 mg/dL (ref 0.0–40.0)

## 2019-04-22 LAB — BASIC METABOLIC PANEL
BUN: 39 mg/dL — ABNORMAL HIGH (ref 6–23)
CO2: 30 mEq/L (ref 19–32)
Calcium: 10 mg/dL (ref 8.4–10.5)
Chloride: 103 mEq/L (ref 96–112)
Creatinine, Ser: 1.87 mg/dL — ABNORMAL HIGH (ref 0.40–1.50)
GFR: 37.26 mL/min — ABNORMAL LOW (ref >60.00)
Glucose, Bld: 118 mg/dL — ABNORMAL HIGH (ref 70–99)
Potassium: 5.1 mEq/L (ref 3.5–5.1)
Sodium: 139 mEq/L (ref 135–145)

## 2019-04-22 LAB — TSH: TSH: 2.47 u[IU]/mL (ref 0.35–4.50)

## 2019-04-22 NOTE — Patient Instructions (Signed)
Fenofibrate 3x weekly only

## 2019-04-23 LAB — FRUCTOSAMINE: Fructosamine: 333 umol/L — ABNORMAL HIGH (ref 0–285)

## 2019-04-26 ENCOUNTER — Telehealth: Payer: Self-pay | Admitting: Endocrinology

## 2019-04-26 NOTE — Telephone Encounter (Signed)
MEDICATION:  Free Style Libre Sensors  PHARMACY:  [facility] in [place] : no  IS PATIENT OUT OF MEDICATION: no  IF NOT; HOW MUCH IS LEFT:   LAST APPOINTMENT DATE: @9 /28/2020  NEXT APPOINTMENT DATE:@11 /16/2020  DO WE HAVE YOUR PERMISSION TO LEAVE A DETAILED MESSAGE: YES, [phone]  OTHER COMMENTS:    **Let patient know to contact pharmacy at the end of the day to make sure medication is ready. **  ** Please notify patient to allow 48-72 hours to process**  **Encourage patient to contact the pharmacy for refills or they can request refills through [facility]**

## 2019-04-29 ENCOUNTER — Other Ambulatory Visit: Payer: Self-pay

## 2019-04-29 DIAGNOSIS — E1165 Type 2 diabetes mellitus with hyperglycemia: Secondary | ICD-10-CM

## 2019-04-29 DIAGNOSIS — Z794 Long term (current) use of insulin: Secondary | ICD-10-CM

## 2019-04-29 MED ORDER — FREESTYLE LIBRE 14 DAY SENSOR MISC: 1.0000 | 2 refills | Status: DC

## 2019-04-29 NOTE — Telephone Encounter (Signed)
Continuous Blood Gluc Sensor (FREESTYLE LIBRE 14 DAY SENSOR) MISC 2 each 2 04/29/2019    Sig - Route: 1 each by Does not apply route every 14 (fourteen) days. For use with continuous glucose monitoring system. Change every 14 days - Does not apply   Sent to pharmacy as: Continuous Blood Gluc Sensor (FREESTYLE LIBRE [place]) Misc   E-Prescribing Status: Receipt confirmed by pharmacy (04/29/2019 3:18 PM EDT)

## 2019-05-07 ENCOUNTER — Telehealth: Payer: Self-pay

## 2019-05-07 NOTE — Telephone Encounter (Signed)
PA paperwork initiated for [employer]. With pt's insurance, the forms must be filled out on paper, and faxed to [employer].  Paperwork has been completed and given to MD for signature.

## 2019-05-16 ENCOUNTER — Telehealth: Payer: Self-pay

## 2019-05-16 NOTE — Telephone Encounter (Signed)
What pt is referring to is that his insurance will only allow him to get the [facility] from a DME supplier and this also requires a prior authorization from his insurance. This paperwork has been received from [facility] and has been filled out accordingly. Currently waiting on MD signature. Once this is obtained, it will be faxed back to [employer].

## 2019-05-16 NOTE — Telephone Encounter (Signed)
Patient called in stating insurance will no longer pay for medicine he has to go through pharmacy. Pharmacy needs a script from Dr   Gloriann Loan: Continuous Blood Gluc Sensor (FREESTYLE LIBRE 14 DAY SENSOR) MISC  Please advise

## 2019-05-16 NOTE — Telephone Encounter (Signed)
Received letter via mail today stating that the [facility] sensors are no longer approved under patients Medicaid plan, despite having done a prior authorization for them.  How would you like to proceed?

## 2019-05-16 NOTE — Telephone Encounter (Signed)
He will check with his DME supplier if Dexcom is covered

## 2019-05-16 NOTE — Telephone Encounter (Signed)
Attempted to call pt and give him MD message. Pt did not answer.  Will attempted to call again later

## 2019-05-25 ENCOUNTER — Other Ambulatory Visit: Payer: Self-pay | Admitting: Endocrinology

## 2019-06-09 ENCOUNTER — Other Ambulatory Visit: Payer: Self-pay | Admitting: Endocrinology

## 2019-06-09 DIAGNOSIS — E1165 Type 2 diabetes mellitus with hyperglycemia: Secondary | ICD-10-CM

## 2019-06-09 DIAGNOSIS — Z794 Long term (current) use of insulin: Secondary | ICD-10-CM

## 2019-06-09 DIAGNOSIS — E039 Hypothyroidism, unspecified: Secondary | ICD-10-CM

## 2019-06-10 ENCOUNTER — Other Ambulatory Visit (INDEPENDENT_AMBULATORY_CARE_PROVIDER_SITE_OTHER): Payer: Medicaid Other

## 2019-06-10 ENCOUNTER — Other Ambulatory Visit: Payer: Self-pay

## 2019-06-10 ENCOUNTER — Encounter: Payer: Self-pay | Admitting: Endocrinology

## 2019-06-10 DIAGNOSIS — E1165 Type 2 diabetes mellitus with hyperglycemia: Secondary | ICD-10-CM | POA: Diagnosis not present

## 2019-06-10 DIAGNOSIS — Z794 Long term (current) use of insulin: Secondary | ICD-10-CM

## 2019-06-10 DIAGNOSIS — E039 Hypothyroidism, unspecified: Secondary | ICD-10-CM

## 2019-06-10 LAB — COMPREHENSIVE METABOLIC PANEL
ALT: 32 U/L (ref 0–53)
AST: 30 U/L (ref 0–37)
Albumin: 4.1 g/dL (ref 3.5–5.2)
Alkaline Phosphatase: 71 U/L (ref 39–117)
BUN: 32 mg/dL — ABNORMAL HIGH (ref 6–23)
CO2: 32 mEq/L (ref 19–32)
Calcium: 10.3 mg/dL (ref 8.4–10.5)
Chloride: 99 mEq/L (ref 96–112)
Creatinine, Ser: 2.05 mg/dL — ABNORMAL HIGH (ref 0.40–1.50)
GFR: 33.5 mL/min — ABNORMAL LOW (ref >60.00)
Glucose, Bld: 222 mg/dL — ABNORMAL HIGH (ref 70–99)
Potassium: 4.8 mEq/L (ref 3.5–5.1)
Sodium: 138 mEq/L (ref 135–145)
Total Bilirubin: 0.4 mg/dL (ref 0.2–1.2)
Total Protein: 7.3 g/dL (ref 6.0–8.3)

## 2019-06-10 LAB — TSH: TSH: 3.89 u[IU]/mL (ref 0.35–4.50)

## 2019-06-10 LAB — HEMOGLOBIN A1C: Hgb A1c MFr Bld: 9 % — ABNORMAL HIGH (ref 4.6–6.5)

## 2019-06-10 LAB — T4, FREE: Free T4: 0.93 ng/dL (ref 0.60–1.60)

## 2019-06-16 NOTE — Progress Notes (Signed)
Patient ID: Ethan Campbell, male   DOB: 03-06-1961, 58 y.o.   MRN: [id number]   Reason for Appointment: follow-up   History of Present Illness   Diagnosis: Type 2 DIABETES MELITUS, date of diagnosis: 2000    Previous history: he has been on insulin for several years with consistently poor control Has been requiring large doses of insulin for his diabetes but A1c has been persistently high Blood sugars did not improve significantly even with trying Byetta and Victoza In 2014 he was switched from NovoLog to U-500 insulin but not clear if he has had improvement in control except with fasting readings His prior A1c was 14.0 in 5/14 and he had educational discussions with diabetes educator and dietitian in 6/14   He was started on the OMNIPOD pump on 02/06/2018  Recent history:   Non-insulin hypoglycemic drugs: Victoza 1.8 mg daily Insulin: Humulin R U-500   Omnipod PUMP settings:   Basal rate: Midnight =0.75, 2 AM = 0.5, 10 AM = 0.4, 1 PM +1.0 and 9 PM +0.8 12 noon = 0.9 and 9 PM = 0.75  Preset boluses: 4-7 units with IC ratio 10 Blood sugar target 150 between 12 AM-7 AM and then 130 Sensitivity 1: 50, active insulin 6 hours   Current management, blood sugar patterns and problems identified:  His basal rates were changed on the last visit although not clear why he has a basal rate of 0.75 at midnight instead of 0.5  He now says that he cannot get the freestyle libre because of Medicaid denial  As before he is only doing his fingersticks somewhat infrequently  He still has significant variability in his blood sugars although on an average they are better now  Not clear why his FASTING blood sugars are periodically low but more recently have been consistently high  Although he thinks this is from sometimes having a sandwich around 4 AM before he goes to bed his blood sugar today was also high at 287 despite only eating a few peanut butter crackers at midnight  He  may have a few good readings between 5-7 PM sometimes but most of his blood sugars otherwise are fairly high  He thinks he is gaining weight, likely to be from his inactivity  Postprandial readings: Not clear what the patterns are but may be higher after certain meals  He thinks he is bolusing 30 days before eating   Mealtimes: 12 pm, 6 pm  Proper timing of medications in relation to meals: Yes, usually 30 minutes before eating .          Monitors blood glucose:  2-  4 times a day.    Glucometer: Accu-Chek  Blood sugar download results:   PRE-MEAL Fasting  midday Dinner Bedtime Overall  Glucose range:  58-572  150-347  115-395  143-318   Mean/median:  216  207  211  2 63  226   POST-MEAL PC Breakfast PC Lunch PC Dinner  Glucose range:   ?  Mean/median:      Previous readings   PRE-MEAL  before breakfast Lunch Dinner Bedtime Overall  Glucose range:       Mean/median:  240  244  242  166  234+/-92     Usually eating 2 meals a day with blood sugars improving after his first meal but not to normal Glucose data incomplete after evening meal but likely somewhat better with pre-meal blood sugar around 280-290   Physical activity: exercise:none  Last dietitian visit: 6/18 and last CDE visit in 4/16  Wt Readings from Last 3 Encounters:  12/18/18 283 lb (128.4 kg)  12/06/18 283 lb (128.4 kg)  12/04/18 283 lb (128.4 kg)    LABS:   Lab Results  Component Value Date   HGBA1C 9.0 (H) 06/10/2019   HGBA1C 8.7 (A) 02/19/2019   HGBA1C 7.0 (A) 11/27/2018   Lab Results  Component Value Date   MICROALBUR 1.6 11/06/2017   LDLCALC 61 04/22/2019   CREATININE 2.05 (H) 06/10/2019    Lab Results  Component Value Date   FRUCTOSAMINE 333 (H) 04/22/2019   FRUCTOSAMINE 250 08/15/2018   FRUCTOSAMINE 512 (H) 11/06/2017    OTHER active problems are discussed in review of systems   Allergies as of 06/17/2019   No Known Allergies     Medication List       Accurate as of  June 17, 2019  1:37 PM. If you have any questions, ask your nurse or doctor.        STOP taking these medications   ferrous sulfate 325 (65 FE) MG tablet Stopped by: Elayne Snare, MD   FreeStyle Libre 14 Day Sensor Misc Stopped by: Elayne Snare, MD   HYDROcodone-acetaminophen 5-325 MG tablet Commonly known as: NORCO/VICODIN Stopped by: Elayne Snare, MD     TAKE these medications   acetaminophen 325 MG tablet Commonly known as: TYLENOL Take 650 mg by mouth every 4 (four) hours as needed for mild pain.   aspirin 81 MG EC tablet Take 1 tablet (81 mg total) by mouth 2 (two) times a day.   atorvastatin 80 MG tablet Commonly known as: LIPITOR Take 1 tablet (80 mg total) by mouth daily.   blood glucose meter kit and supplies Dispense based on patient and insurance preference. Use up to four times daily as directed. (FOR ICD-10 E10.9, E11.9).   carvedilol 6.25 MG tablet Commonly known as: COREG Take 1 tablet (6.25 mg total) by mouth 2 (two) times daily with a meal.   DULoxetine 60 MG capsule Commonly known as: CYMBALTA Take 1 capsule (60 mg total) by mouth daily.   fenofibrate 145 MG tablet Commonly known as: TRICOR Take 1 tablet (145 mg total) by mouth daily.   freestyle lancets Use as instructed   furosemide 40 MG tablet Commonly known as: LASIX Take 40 mg by mouth. What changed: Another medication with the same name was removed. Continue taking this medication, and follow the directions you see here. Changed by: Elayne Snare, MD   gabapentin 300 MG capsule Commonly known as: NEURONTIN Take 300 mg by mouth 3 (three) times daily. Take 35m by mouth three times daily.   glucagon 1 MG injection Follow package directions for low blood sugar.   glucose blood test strip Commonly known as: Accu-Chek Aviva Plus TEST UP TO 4 TIMES DAILY Dx code E11.65   glucose blood test strip Commonly known as: FREESTYLE LITE Check sugar 3 times daily   HUMULIN R 500 UNIT/ML  injection Generic drug: insulin regular human CONCENTRATED USE AS DIRECTED, INJECT UP TO 30 UNITS 3 TIMES A DAY.   Insulin Pen Needle 32G X 8 MM Misc Use as directed   levothyroxine 100 MCG tablet Commonly known as: SYNTHROID Take 1 tablet (100 mcg total) by mouth daily. Take 1 tablet daily before breakfast   liraglutide 18 MG/3ML Sopn Commonly known as: Victoza Inject 0.3 mLs (1.8 mg total) into the skin daily for 30 days.   meclizine 25 MG tablet  Commonly known as: ANTIVERT Take 1 tablet (25 mg total) by mouth every 8 (eight) hours as needed for dizziness.   OmniPod 10 Pack Misc by Does not apply route.   polyethylene glycol 17 g packet Commonly known as: MIRALAX / GLYCOLAX Take 17 g by mouth daily as needed for mild constipation.       Allergies: No Known Allergies  Past Medical History:  Diagnosis Date  . Arm vein blood clot, left    on Coumadin  . Arthritis   . Cancer ([place])    Bone cancer 1987 (in knee Large cell tumor)  . Chronic kidney disease    stage 3  . Depression   . Essential hypertension   . Family history of adverse reaction to anesthesia    " my mother woke up during a cardiac surgery "  . History of stroke   . Hyperlipidemia   . Hypothyroidism   . Insomnia   . Obesity   . Precordial pain June 2011   Nuclear stress; no ischemia; EF 60%  . Presence of permanent cardiac pacemaker   . Proteinuria   . Seizure disorder ([place])   . Sick sinus syndrome ([place])   . Sleep apnea    Dr. Brandon Melnick, uses bipap  . Syncope   . Type 2 diabetes mellitus ([place])   . Venous insufficiency     Past Surgical History:  Procedure Laterality Date  . AMPUTATION Right 06/20/2018   Procedure: RIGHT GREAT TOE AMPUTATION;  Surgeon: Newt Minion, MD;  Location: [facility];  Service: Orthopedics;  Laterality: Right;  . AMPUTATION Right 09/26/2018   Procedure: RIGHT FOOT 5TH RAY AMPUTATION;  Surgeon: Newt Minion, MD;  Location: [facility];  Service: Orthopedics;  Laterality: Right;   MAC and regional anesthesia  . AMPUTATION Right 10/05/2018   Procedure: RIGHT BELOW KNEE AMPUTATION;  Surgeon: Newt Minion, MD;  Location: [facility];  Service: Orthopedics;  Laterality: Right;  . AMPUTATION Right 10/26/2018   Procedure: RIGHT ABOVE KNEE AMPUTATION;  Surgeon: Newt Minion, MD;  Location: [facility];  Service: Orthopedics;  Laterality: Right;  . COLONOSCOPY    . RESECTION BONE TUMOR FEMUR  1980's   Left femur, [facility] at [facility] with bone graft  . STUMP REVISION Right 11/09/2018   Procedure: REVISION RIGHT ABOVE KNEE AMPUTATION;  Surgeon: Newt Minion, MD;  Location: [facility];  Service: Orthopedics;  Laterality: Right;  . TOTAL KNEE ARTHROPLASTY Left 2013    Family History  Problem Relation Age of Onset  . Heart attack Mother 73  . Breast cancer Mother   . Stroke Mother   . Heart attack Father 71  . Breast cancer Sister   . Colon cancer Sister     Social History:  reports that he has quit smoking. His smoking use included cigarettes. He has never used smokeless tobacco. He reports that he does not drink alcohol or use drugs.  Review of Systems:    HYPOTHYROIDISM:   on  levothyroxine 100 ug daily The dose was increased in 11/19 when TSH was high on 88 mcg Recent fatigue not present   Lab Results  Component Value Date   TSH 3.89 06/10/2019   SYNCOPAL episodes: None recently and he has not been very active because of amputation  Neurologist indicated that he may have vertebral artery flow issues  His antihypertensives have been stopped Now only on low-dose carvedilol by cardiologist  Lipids: triglycerides on labs on the last measurement are below 150  However still continues to take fenofibrate daily with his renal dysfunction  Followed by PCP   Lab Results  Component Value Date   CHOL 114 04/22/2019   HDL 37.70 (L) 04/22/2019   LDLCALC 61 04/22/2019   LDLDIRECT 80.0 03/07/2016   TRIG 76.0 04/22/2019   CHOLHDL 3 04/22/2019      CKD: His creatinine has been  In the 2-3 range in the past but more recently slightly better He is due to see a new nephrologist now  Lab Results  Component Value Date   CREATININE 2.05 (H) 06/10/2019   History of CHF, taking Lasix using 40 mg tablet  He has had less edema on his left leg recently however his other physician had recommended elastic stocking  Examination:   BP 110/60 (BP Location: Left Arm, Patient Position: Sitting, Cuff Size: Large)   Pulse 69   Ht _0  (1.753 m)   SpO2 98%   BMI 41.79 kg/m   Body mass index is 41.79 kg/m.     ASSESSMENT/ PLAN:   Diabetes type 2 with obesity, insulin-dependent  See history of present illness for detailed discussion of current diabetes management, blood sugar patterns and problems identified  He has been on OMNIPOD insulin pump with U-500 insulin  However A1c is 9%  He is still having poor control with average blood sugar over 200 at all given times in the day Without his CGM he is not able to control his blood sugars as well  Also has inconsistent blood sugars on waking up but recently mostly high Appears to be needing higher dose of basal insulin also now  Basal rate changes: Midnight = 0.85, 2 AM = 0.55, 8 AM = 0.65, 1 PM = 1.0 and 6 PM = 0.95 He will bolus at least 1 to 2 units more for his first meal of the day Also needs to bolus at least 1 to 2 units and preferably 4 units if he is eating a significant snack during the night Consistent boluses for suppertime unless skipping the meal  Follow-up in 8 weeks  HYPOTHYROIDISM: Adequate control  To stay on the same dose  Lipids: To have fasting lipids on the next visit Again reminded him to reduce his fenofibrate to 3 times a week which he has not done so  Patient Instructions  7 units for lunch and 4 units for small meals and nighttime snacks.  Dinner will be 7 to 8 units based on how much you eating  Basal rate settings: Midnight = 0.85, 2 AM = 0.55, 8 AM = 0.65, 1 PM = 1.0 and 6 PM =  0.95   Influenza vaccine on the next visit, not clear if he has had this from his PCP      Elayne Snare 06/17/2019, 1:37 PM   Note: This office note was prepared with Dragon voice recognition system technology. Any transcriptional errors that result from this process are unintentional.

## 2019-06-17 ENCOUNTER — Encounter: Payer: Self-pay | Admitting: Endocrinology

## 2019-06-17 ENCOUNTER — Other Ambulatory Visit: Payer: Self-pay

## 2019-06-17 ENCOUNTER — Ambulatory Visit (INDEPENDENT_AMBULATORY_CARE_PROVIDER_SITE_OTHER): Payer: Medicaid Other | Admitting: Endocrinology

## 2019-06-17 VITALS — BP 110/60 | HR 69 | Ht 69.0 in

## 2019-06-17 DIAGNOSIS — Z794 Long term (current) use of insulin: Secondary | ICD-10-CM | POA: Diagnosis not present

## 2019-06-17 DIAGNOSIS — E782 Mixed hyperlipidemia: Secondary | ICD-10-CM | POA: Diagnosis not present

## 2019-06-17 DIAGNOSIS — E063 Autoimmune thyroiditis: Secondary | ICD-10-CM

## 2019-06-17 DIAGNOSIS — E1165 Type 2 diabetes mellitus with hyperglycemia: Secondary | ICD-10-CM

## 2019-06-17 NOTE — Patient Instructions (Addendum)
7 units for lunch and 4 units for small meals and nighttime snacks.  Dinner will be 7 to 8 units based on how much you eating  Basal rate settings: Midnight = 0.85, 2 AM = 0.55, 8 AM = 0.65, 1 PM = 1.0 and 6 PM = 0.95  Fenofibrate M/W/F

## 2019-06-26 ENCOUNTER — Other Ambulatory Visit: Payer: Self-pay

## 2019-06-26 ENCOUNTER — Telehealth: Payer: Self-pay

## 2019-06-26 MED ORDER — VICTOZA 18 MG/3ML ~~LOC~~ SOPN: 1.8000 mg | PEN_INJECTOR | Freq: Every day | SUBCUTANEOUS | 2 refills | Status: AC

## 2019-06-26 NOTE — Telephone Encounter (Signed)
Needing a PA for : Victoza and also a refill per pharmacy

## 2019-06-26 NOTE — Telephone Encounter (Signed)
PA initiated and approved through [place] operation call center for Victoza 18mg /37ml injecting 1.8mg  daily.  Approval [id number] Approval good from 06/26/2019 through 06/20/2020. Interactive call ID #: [id number]

## 2019-08-18 ENCOUNTER — Other Ambulatory Visit: Payer: Self-pay | Admitting: Endocrinology

## 2019-08-18 DIAGNOSIS — E1165 Type 2 diabetes mellitus with hyperglycemia: Secondary | ICD-10-CM

## 2019-08-18 DIAGNOSIS — E063 Autoimmune thyroiditis: Secondary | ICD-10-CM

## 2019-08-18 DIAGNOSIS — Z794 Long term (current) use of insulin: Secondary | ICD-10-CM

## 2019-08-19 ENCOUNTER — Other Ambulatory Visit (INDEPENDENT_AMBULATORY_CARE_PROVIDER_SITE_OTHER): Payer: Medicaid Other

## 2019-08-19 ENCOUNTER — Other Ambulatory Visit: Payer: Self-pay

## 2019-08-19 DIAGNOSIS — E063 Autoimmune thyroiditis: Secondary | ICD-10-CM

## 2019-08-19 DIAGNOSIS — E1165 Type 2 diabetes mellitus with hyperglycemia: Secondary | ICD-10-CM

## 2019-08-19 DIAGNOSIS — Z794 Long term (current) use of insulin: Secondary | ICD-10-CM | POA: Diagnosis not present

## 2019-08-19 LAB — COMPREHENSIVE METABOLIC PANEL
ALT: 28 U/L (ref 0–53)
AST: 28 U/L (ref 0–37)
Albumin: 3.7 g/dL (ref 3.5–5.2)
Alkaline Phosphatase: 69 U/L (ref 39–117)
BUN: 32 mg/dL — ABNORMAL HIGH (ref 6–23)
CO2: 29 mEq/L (ref 19–32)
Calcium: 9.9 mg/dL (ref 8.4–10.5)
Chloride: 104 mEq/L (ref 96–112)
Creatinine, Ser: 1.68 mg/dL — ABNORMAL HIGH (ref 0.40–1.50)
GFR: 42.12 mL/min — ABNORMAL LOW (ref >60.00)
Glucose, Bld: 110 mg/dL — ABNORMAL HIGH (ref 70–99)
Potassium: 4.4 mEq/L (ref 3.5–5.1)
Sodium: 139 mEq/L (ref 135–145)
Total Bilirubin: 0.4 mg/dL (ref 0.2–1.2)
Total Protein: 6.9 g/dL (ref 6.0–8.3)

## 2019-08-19 LAB — TSH: TSH: 6.66 u[IU]/mL — ABNORMAL HIGH (ref 0.35–4.50)

## 2019-08-19 LAB — T4, FREE: Free T4: 0.95 ng/dL (ref 0.60–1.60)

## 2019-08-20 LAB — URINALYSIS, ROUTINE W REFLEX MICROSCOPIC
Bilirubin Urine: NEGATIVE
Hgb urine dipstick: NEGATIVE
Ketones, ur: NEGATIVE
Leukocytes,Ua: NEGATIVE
Nitrite: NEGATIVE
RBC / HPF: NONE SEEN (ref 0–?)
Specific Gravity, Urine: 1.02 (ref 1.000–1.030)
Urine Glucose: NEGATIVE
Urobilinogen, UA: 0.2 (ref 0.0–1.0)
pH: 5.5 (ref 5.0–8.0)

## 2019-08-20 LAB — MICROALBUMIN / CREATININE URINE RATIO
Creatinine,U: 32.6 mg/dL
Microalb Creat Ratio: 51.2 mg/g — ABNORMAL HIGH (ref 0.0–30.0)
Microalb, Ur: 16.7 mg/dL — ABNORMAL HIGH (ref 0.0–1.9)

## 2019-08-20 LAB — FRUCTOSAMINE: Fructosamine: 341 umol/L — ABNORMAL HIGH (ref 0–285)

## 2019-08-22 ENCOUNTER — Ambulatory Visit: Payer: Medicaid Other | Admitting: Endocrinology

## 2019-09-19 ENCOUNTER — Other Ambulatory Visit: Payer: Self-pay

## 2019-09-23 ENCOUNTER — Ambulatory Visit: Payer: Medicaid Other | Admitting: Endocrinology

## 2019-10-11 ENCOUNTER — Other Ambulatory Visit: Payer: Self-pay | Admitting: Endocrinology

## 2019-10-28 ENCOUNTER — Telehealth: Payer: Self-pay | Admitting: Endocrinology

## 2019-10-28 NOTE — Telephone Encounter (Signed)
Called pt and informed him that he needed to first contact [facility] and request the refill and forms to be filled out would be faxed to this office. Pt verbalized understanding.

## 2019-10-28 NOTE — Telephone Encounter (Signed)
MEDICATION: Omni Pod   PHARMACY:  [facility]   IS THIS A 90 DAY SUPPLY :   IS PATIENT OUT OF MEDICATION: yes   IF NOT; HOW MUCH IS LEFT: been out for 3 days  LAST APPOINTMENT DATE: @3 /19/2021  NEXT APPOINTMENT DATE:@4 /12/2019  DO WE HAVE YOUR PERMISSION TO LEAVE A DETAILED MESSAGE: yes [phone]  OTHER COMMENTS: patient supplier changed for Omni Pods and per Medicaid a new RX needed to be sent   **Let patient know to contact pharmacy at the end of the day to make sure medication is ready. **  ** Please notify patient to allow 48-72 hours to process**  **Encourage patient to contact the pharmacy for refills or they can request refills through [facility]

## 2019-10-29 ENCOUNTER — Ambulatory Visit (INDEPENDENT_AMBULATORY_CARE_PROVIDER_SITE_OTHER): Payer: Medicaid Other | Admitting: Endocrinology

## 2019-10-29 ENCOUNTER — Encounter: Payer: Self-pay | Admitting: Endocrinology

## 2019-10-29 ENCOUNTER — Other Ambulatory Visit: Payer: Self-pay

## 2019-10-29 VITALS — BP 122/74 | HR 77 | Ht 69.0 in

## 2019-10-29 DIAGNOSIS — E039 Hypothyroidism, unspecified: Secondary | ICD-10-CM | POA: Diagnosis not present

## 2019-10-29 DIAGNOSIS — E1165 Type 2 diabetes mellitus with hyperglycemia: Secondary | ICD-10-CM

## 2019-10-29 DIAGNOSIS — Z794 Long term (current) use of insulin: Secondary | ICD-10-CM

## 2019-10-29 DIAGNOSIS — E1142 Type 2 diabetes mellitus with diabetic polyneuropathy: Secondary | ICD-10-CM | POA: Diagnosis not present

## 2019-10-29 DIAGNOSIS — S88111A Complete traumatic amputation at level between knee and ankle, right lower leg, initial encounter: Secondary | ICD-10-CM | POA: Diagnosis not present

## 2019-10-29 LAB — POCT GLYCOSYLATED HEMOGLOBIN (HGB A1C): Hemoglobin A1C: 10.2 % — AB (ref 4.0–5.6)

## 2019-10-29 MED ORDER — LEVOTHYROXINE SODIUM 112 MCG PO TABS: 112.0000 ug | ORAL_TABLET | Freq: Every day | ORAL | 3 refills | Status: DC

## 2019-10-29 NOTE — Progress Notes (Signed)
Patient ID: Ethan Campbell, male   DOB: [birth year], 59 y.o.   MRN: [id number]   Reason for Appointment: follow-up   History of Present Illness   Diagnosis: Type 2 DIABETES MELITUS, date of diagnosis: 2000    Previous history: he has been on insulin for several years with consistently poor control Has been requiring large doses of insulin for his diabetes but A1c has been persistently high Blood sugars did not improve significantly even with trying Byetta and Victoza In 2014 he was switched from NovoLog to U-500 insulin but not clear if he has had improvement in control except with fasting readings His prior A1c was 14.0 in 5/14 and he had educational discussions with diabetes educator and dietitian in 6/14   He was started on the OMNIPOD pump on 02/06/2018  Recent history:   Non-insulin hypoglycemic drugs: Victoza 1.8 mg daily Insulin: Humulin R U-500   Omnipod PUMP settings:   Basal rate: Midnight =0.75, 2 AM = 0.55, 8am 0.65; 1 PM +1.0  12 noon = 0.9 and 9 PM = 0.75  Preset boluses: 4-7 units with IC ratio 10 Blood sugar target 150 between 12 AM-7 AM and then 130 Sensitivity 1: 50, active insulin 6 hours  His A1c is up to 10.2 compared to 9%  Current management, blood sugar patterns and problems identified:  He was last seen in 11/20  Blood sugars appear to be much higher than usual and mostly have a high reading level compared to previously variable readings with averages around 220  He has not missed any Victoza doses  He says that he is now changing his diet in any way  He appears to still have significantly high readings in the morning or before his first meal   He does not feel that he has gained any weight recently  Probably not checking blood sugars enough and only before meals usually  Only today.  Blood sugar was below 200 otherwise it usually is well over 200 at all times  Not taking any correction doses in between meals or at bedtime and  blood sugars are high  He is having some difficulty getting his supplies for the pump and using the syringes for injection for the last few days   Mealtimes: 12 pm, 6 pm  Proper timing of medications in relation to meals: Yes, usually 30 minutes before eating .          Monitors blood glucose:  2-  4 times a day.    Glucometer: Accu-Chek  Blood sugar download results:   PRE-MEAL  AC breakfast Lunch Dinner Bedtime Overall  Glucose range:  182-379   211-397  313-563  78-567  Mean/median:  340   317   333   Previous readings:  PRE-MEAL Fasting  midday Dinner Bedtime Overall  Glucose range:  58-572  150-347  115-395  143-318   Mean/median:  216  207  211  2 63  226   POST-MEAL PC Breakfast PC Lunch PC Dinner  Glucose range:   ?  Mean/median:        Usually eating 2 meals a day with blood sugars improving after his first meal but not to normal Glucose data incomplete after evening meal but likely somewhat better with pre-meal blood sugar around 280-290   Physical activity: exercise:none   Last dietitian visit: 6/18 and last CDE visit in 4/16  Wt Readings from Last 3 Encounters:  12/18/18 283 lb (128.4 kg)  12/06/18 283  lb (128.4 kg)  12/04/18 283 lb (128.4 kg)    LABS:   Lab Results  Component Value Date   HGBA1C 10.2 (A) 10/29/2019   HGBA1C 9.0 (H) 06/10/2019   HGBA1C 8.7 (A) 02/19/2019   Lab Results  Component Value Date   MICROALBUR 16.7 (H) 08/19/2019   LDLCALC 61 04/22/2019   CREATININE 1.68 (H) 08/19/2019    Lab Results  Component Value Date   FRUCTOSAMINE 341 (H) 08/19/2019   FRUCTOSAMINE 333 (H) 04/22/2019   FRUCTOSAMINE 250 08/15/2018    OTHER active problems are discussed in review of systems   Allergies as of 10/29/2019   No Known Allergies     Medication List       Accurate as of October 29, 2019  3:21 PM. If you have any questions, ask your nurse or doctor.        STOP taking these medications   acetaminophen 325 MG tablet Commonly  known as: TYLENOL Stopped by: Elayne Snare, MD     TAKE these medications   aspirin 81 MG EC tablet Take 1 tablet (81 mg total) by mouth 2 (two) times a day.   atorvastatin 80 MG tablet Commonly known as: LIPITOR Take 1 tablet (80 mg total) by mouth daily.   blood glucose meter kit and supplies Dispense based on patient and insurance preference. Use up to four times daily as directed. (FOR ICD-10 E10.9, E11.9).   carvedilol 6.25 MG tablet Commonly known as: COREG Take 1 tablet (6.25 mg total) by mouth 2 (two) times daily with a meal.   DULoxetine 60 MG capsule Commonly known as: CYMBALTA Take 1 capsule (60 mg total) by mouth daily.   fenofibrate 145 MG tablet Commonly known as: TRICOR Take 1 tablet (145 mg total) by mouth daily.   freestyle lancets Use as instructed   furosemide 40 MG tablet Commonly known as: LASIX Take 40 mg by mouth.   gabapentin 300 MG capsule Commonly known as: NEURONTIN Take 300 mg by mouth 3 (three) times daily. Take 365m by mouth three times daily.   glucagon 1 MG injection Follow package directions for low blood sugar.   glucose blood test strip Commonly known as: Accu-Chek Aviva Plus TEST UP TO 4 TIMES DAILY Dx code E11.65   glucose blood test strip Commonly known as: FREESTYLE LITE Check sugar 3 times daily   HUMULIN R 500 UNIT/ML injection Generic drug: insulin regular human CONCENTRATED Use up to 1 mL to fill insulin pump daily   Insulin Pen Needle 32G X 8 MM Misc Use as directed   levothyroxine 100 MCG tablet Commonly known as: SYNTHROID Take 1 tablet (100 mcg total) by mouth daily. Take 1 tablet daily before breakfast   meclizine 25 MG tablet Commonly known as: ANTIVERT Take 1 tablet (25 mg total) by mouth every 8 (eight) hours as needed for dizziness.   OmniPod 10 Pack Misc by Does not apply route.   polyethylene glycol 17 g packet Commonly known as: MIRALAX / GLYCOLAX Take 17 g by mouth daily as needed for mild  constipation.   Victoza 18 MG/3ML Sopn Generic drug: liraglutide Inject 0.3 mLs (1.8 mg total) into the skin daily.       Allergies: No Known Allergies  Past Medical History:  Diagnosis Date  . Arm vein blood clot, left    on Coumadin  . Arthritis   . Cancer ([place]    Bone cancer 1987 (in knee Large cell tumor)  . Chronic kidney disease  stage 3  . Depression   . Essential hypertension   . Family history of adverse reaction to anesthesia    " my mother woke up during a cardiac surgery "  . History of stroke   . Hyperlipidemia   . Hypothyroidism   . Insomnia   . Obesity   . Precordial pain June 2011   Nuclear stress; no ischemia; EF 60%  . Presence of permanent cardiac pacemaker   . Proteinuria   . Seizure disorder ([place])   . Sick sinus syndrome ([place])   . Sleep apnea    Dr. Brandon Melnick, uses bipap  . Syncope   . Type 2 diabetes mellitus ([place])   . Venous insufficiency     Past Surgical History:  Procedure Laterality Date  . AMPUTATION Right 06/20/2018   Procedure: RIGHT GREAT TOE AMPUTATION;  Surgeon: Newt Minion, MD;  Location: [place];  Service: Orthopedics;  Laterality: Right;  . AMPUTATION Right 09/26/2018   Procedure: RIGHT FOOT 5TH RAY AMPUTATION;  Surgeon: Newt Minion, MD;  Location: [place];  Service: Orthopedics;  Laterality: Right;  MAC and regional anesthesia  . AMPUTATION Right 10/05/2018   Procedure: RIGHT BELOW KNEE AMPUTATION;  Surgeon: Newt Minion, MD;  Location: [place];  Service: Orthopedics;  Laterality: Right;  . AMPUTATION Right 10/26/2018   Procedure: RIGHT ABOVE KNEE AMPUTATION;  Surgeon: Newt Minion, MD;  Location: [place];  Service: Orthopedics;  Laterality: Right;  . COLONOSCOPY    . RESECTION BONE TUMOR FEMUR  1980's   Left femur, treated at [facility] with bone graft  . STUMP REVISION Right 11/09/2018   Procedure: REVISION RIGHT ABOVE KNEE AMPUTATION;  Surgeon: Newt Minion, MD;  Location: [place];  Service: Orthopedics;  Laterality: Right;  .  TOTAL KNEE ARTHROPLASTY Left 2013    Family History  Problem Relation Age of Onset  . Heart attack Mother 84  . Breast cancer Mother   . Stroke Mother   . Heart attack Father 1  . Breast cancer Sister   . Colon cancer Sister     Social History:  reports that he has quit smoking. His smoking use included cigarettes. He has never used smokeless tobacco. He reports that he does not drink alcohol or use drugs.  Review of Systems:    HYPOTHYROIDISM:   on  levothyroxine 100 ug daily The dose was increased in 11/19 when TSH was high on 88 mcg He did not come for his follow-up visit when he had labs in 1/21 and TSH was high   Lab Results  Component Value Date   TSH 6.66 (H) 08/19/2019     Lipids: triglycerides on labs on the last measurement are below 150 Lipids excellent as of the last visit He is on atorvastatin 80 mg and fenofibrate  Followed by PCP   Lab Results  Component Value Date   CHOL 114 04/22/2019   HDL 37.70 (L) 04/22/2019   LDLCALC 61 04/22/2019   LDLDIRECT 80.0 03/07/2016   TRIG 76.0 04/22/2019   CHOLHDL 3 04/22/2019      CKD: His creatinine has been In the 2-3 range in the past but recently slightly better   Lab Results  Component Value Date   CREATININE 1.68 (H) 08/19/2019   History of CHF, taking Lasix using 40 mg tablet    Examination:   BP 122/74 (BP Location: Left Arm, Patient Position: Sitting, Cuff Size: Normal)   Pulse 77   Ht _0  (1.753 m)  SpO2 97%   BMI 41.79 kg/m   Body mass index is 41.79 kg/m.     ASSESSMENT/ PLAN:   Diabetes type 2 with obesity, insulin-dependent  See history of present illness for detailed discussion of current diabetes management, blood sugar patterns and problems identified  He has been on OMNIPOD insulin pump with U-500 insulin  However recently having difficulty with his pump supplies  A1c is relatively higher at 10.2  Even with changing his basal rates on the last visit he appears to be  having much higher readings averaging over 300 throughout the day and possibly higher readings later at night As before is not able to get his CGM covered by his insurance  Basal rate changes: Midnight = 1.1, 2 AM = 0.75, 8 AM = 0.90 and 1 PM = 1.25  Correction factor I: 40 He will bolus at least 20 to 30-minute before his meals Continue Victoza in the morning Make sure he checks his blood sugars at least 3-4 times a day and take correction doses at night if needed In the meantime while using the injection he will add another 5 units at bedtime with the U-500 insulin and increase his bolus doses by 3 units each time Balanced meals with low carbohydrate intake  Follow-up in 8 weeks  HYPOTHYROIDISM: Previously had an adequate level of control but his TSH was high in January  We will go up to 112 on his levothyroxine compared to 100 mcg dose now  Lipids: Labs will be repeated on next visit  Renal dysfunction: Apparently is improving  There are no Patient Instructions on file for this visit.         Elayne Snare 10/29/2019, 3:21 PM   Note: This office note was prepared with Dragon voice recognition system technology. Any transcriptional errors that result from this process are unintentional.

## 2019-10-29 NOTE — Patient Instructions (Addendum)
Take 3 units more for shots and 5 units at bedtime  Check blood sugars on waking up 7 days a week  Also check blood sugars about 2 hours after meals and do this after different meals by rotation  Recommended blood sugar levels on waking up are 90-130 and about 2 hours after meal is 130-160  Please bring your blood sugar monitor to each visit, thank you

## 2019-11-12 ENCOUNTER — Encounter (INDEPENDENT_AMBULATORY_CARE_PROVIDER_SITE_OTHER): Payer: Medicaid Other | Admitting: Ophthalmology

## 2019-11-20 DIAGNOSIS — E113591 Type 2 diabetes mellitus with proliferative diabetic retinopathy without macular edema, right eye: Secondary | ICD-10-CM | POA: Insufficient documentation

## 2019-11-20 DIAGNOSIS — H2513 Age-related nuclear cataract, bilateral: Secondary | ICD-10-CM | POA: Insufficient documentation

## 2019-11-20 DIAGNOSIS — E113592 Type 2 diabetes mellitus with proliferative diabetic retinopathy without macular edema, left eye: Secondary | ICD-10-CM | POA: Insufficient documentation

## 2019-11-20 DIAGNOSIS — H35043 Retinal micro-aneurysms, unspecified, bilateral: Secondary | ICD-10-CM | POA: Insufficient documentation

## 2019-11-20 DIAGNOSIS — H4312 Vitreous hemorrhage, left eye: Secondary | ICD-10-CM | POA: Insufficient documentation

## 2019-11-21 ENCOUNTER — Encounter (INDEPENDENT_AMBULATORY_CARE_PROVIDER_SITE_OTHER): Payer: Self-pay | Admitting: Ophthalmology

## 2019-11-21 ENCOUNTER — Ambulatory Visit (INDEPENDENT_AMBULATORY_CARE_PROVIDER_SITE_OTHER): Payer: Medicaid Other | Admitting: Ophthalmology

## 2019-11-21 ENCOUNTER — Other Ambulatory Visit: Payer: Self-pay

## 2019-11-21 DIAGNOSIS — H2513 Age-related nuclear cataract, bilateral: Secondary | ICD-10-CM

## 2019-11-21 DIAGNOSIS — H4312 Vitreous hemorrhage, left eye: Secondary | ICD-10-CM | POA: Diagnosis not present

## 2019-11-21 DIAGNOSIS — H35043 Retinal micro-aneurysms, unspecified, bilateral: Secondary | ICD-10-CM

## 2019-11-21 DIAGNOSIS — E113591 Type 2 diabetes mellitus with proliferative diabetic retinopathy without macular edema, right eye: Secondary | ICD-10-CM | POA: Diagnosis not present

## 2019-11-21 DIAGNOSIS — E113592 Type 2 diabetes mellitus with proliferative diabetic retinopathy without macular edema, left eye: Secondary | ICD-10-CM

## 2019-11-21 NOTE — Assessment & Plan Note (Signed)
We will plan   photocoagulation  PRP OS today to the superior portion of the retina which is untreated

## 2019-11-21 NOTE — Progress Notes (Signed)
  11/21/2019     CHIEF COMPLAINT Patient presents for Retina Follow Up   HISTORY OF PRESENT ILLNESS: Ethan Campbell is a 58 y.o. male who presents to the clinic today for:   HPI    Retina Follow Up    Patient presents with  Diabetic Retinopathy.  In left eye.  This started 4 weeks ago.  Severity is mild.  Duration of 4 weeks.  Since onset it is stable.          Comments    4 Week PRP OS  Pt denies noticeable changes to [facility] OU since last visit. Pt denies ocular pain, flashes of light, or changes to floaters OU. Pt reports continued intermittent floaters OD. **Pt sts blood sugar was running in the 500s approx 2 weeks ago, but now that he is back on his 'Omnipod', it is stable again. LBS: 138 this AM        Last edited by Kennerly, Paige, COA on 11/21/2019  2:55 PM. (History)      Referring physician: Boone, Angela, NP [address] [place],  [place] [id number]  HISTORICAL INFORMATION:   Selected notes from the medical record:     Lab Results  Component Value Date   HGBA1C 10.2 (A) 10/29/2019     CURRENT MEDICATIONS: No current outpatient medications on file. (Ophthalmic Drugs)   No current facility-administered medications for this visit. (Ophthalmic Drugs)   Current Outpatient Medications (Other)  Medication Sig  . aspirin EC 81 MG EC tablet Take 1 tablet (81 mg total) by mouth 2 (two) times a day.  . atorvastatin (LIPITOR) 80 MG tablet Take 1 tablet (80 mg total) by mouth daily.  . blood glucose meter kit and supplies Dispense based on patient and insurance preference. Use up to four times daily as directed. (FOR ICD-10 E10.9, E11.9).  . carvedilol (COREG) 6.25 MG tablet Take 1 tablet (6.25 mg total) by mouth 2 (two) times daily with a meal.  . DULoxetine (CYMBALTA) 60 MG capsule Take 1 capsule (60 mg total) by mouth daily.  . fenofibrate (TRICOR) 145 MG tablet Take 1 tablet (145 mg total) by mouth daily.  . furosemide (LASIX) 40 MG tablet Take 40 mg by mouth.  .  gabapentin (NEURONTIN) 300 MG capsule Take 300 mg by mouth 3 (three) times daily. Take 300mg by mouth three times daily.  . glucagon 1 MG injection Follow package directions for low blood sugar.  . glucose blood (ACCU-CHEK AVIVA PLUS) test strip TEST UP TO 4 TIMES DAILY Dx code E11.65  . glucose blood (FREESTYLE LITE) test strip Check sugar 3 times daily  . Insulin Disposable Pump (OMNIPOD 10 PACK) MISC by Does not apply route.  . Insulin Pen Needle 32G X 8 MM MISC Use as directed  . insulin regular human CONCENTRATED (HUMULIN R) 500 UNIT/ML injection Use up to 1 mL to fill insulin pump daily  . Lancets (FREESTYLE) lancets Use as instructed  . levothyroxine (SYNTHROID) 112 MCG tablet Take 1 tablet (112 mcg total) by mouth daily.  . liraglutide (VICTOZA) 18 MG/3ML SOPN Inject 0.3 mLs (1.8 mg total) into the skin daily.  . meclizine (ANTIVERT) 25 MG tablet Take 1 tablet (25 mg total) by mouth every 8 (eight) hours as needed for dizziness.  . polyethylene glycol (MIRALAX / GLYCOLAX) packet Take 17 g by mouth daily as needed for mild constipation.   No current facility-administered medications for this visit. (Other)      REVIEW OF SYSTEMS:      ALLERGIES No Known Allergies  PAST MEDICAL HISTORY Past Medical History:  Diagnosis Date  . Arm vein blood clot, left    on Coumadin  . Arthritis   . 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 Chronic kidney disease    stage 3  . Depression   . Essential hypertension   . Family history of adverse reaction to anesthesia    " my mother woke up during a cardiac surgery "  . History of stroke   . Hyperlipidemia   . Hypothyroidism   . Insomnia   . Obesity   . Precordial pain June 2011   Nuclear stress; no ischemia; EF 60%  . Presence of permanent cardiac pacemaker   . Proteinuria   . Seizure disorder ([place])   . Sick sinus syndrome ([place])   . Sleep apnea    Dr. Brandon Melnick, uses bipap  . Syncope   . Type 2 diabetes  mellitus ([place])   . Venous insufficiency    Past Surgical History:  Procedure Laterality Date  . AMPUTATION Right 06/20/2018   Procedure: RIGHT GREAT TOE AMPUTATION;  Surgeon: Newt Minion, MD;  Location: [place];  Service: Orthopedics;  Laterality: Right;  . AMPUTATION Right 09/26/2018   Procedure: RIGHT FOOT 5TH RAY AMPUTATION;  Surgeon: Newt Minion, MD;  Location: [place];  Service: Orthopedics;  Laterality: Right;  MAC and regional anesthesia  . AMPUTATION Right 10/05/2018   Procedure: RIGHT BELOW KNEE AMPUTATION;  Surgeon: Newt Minion, MD;  Location: [place];  Service: Orthopedics;  Laterality: Right;  . AMPUTATION Right 10/26/2018   Procedure: RIGHT ABOVE KNEE AMPUTATION;  Surgeon: Newt Minion, MD;  Location: [place];  Service: Orthopedics;  Laterality: Right;  . COLONOSCOPY    . RESECTION BONE TUMOR FEMUR  1980's   Left femur, treated at [facility] with bone graft  . STUMP REVISION Right 11/09/2018   Procedure: REVISION RIGHT ABOVE KNEE AMPUTATION;  Surgeon: Newt Minion, MD;  Location: [place];  Service: Orthopedics;  Laterality: Right;  . TOTAL KNEE ARTHROPLASTY Left 2013    FAMILY HISTORY Family History  Problem Relation Age of Onset  . Heart attack Mother 21  . Breast cancer Mother   . Stroke Mother   . Heart attack Father 19  . Breast cancer Sister   . Colon cancer Sister     SOCIAL HISTORY Social History   Tobacco Use  . Smoking status: Former Smoker    Types: Cigarettes  . Smokeless tobacco: Never Used  . Tobacco comment: 11/27/14 "quit smoking years ago"  Substance Use Topics  . Alcohol use: No    Alcohol/week: 0.0 standard drinks  . Drug use: No         OPHTHALMIC EXAM:  Base Eye Exam    Visual Acuity (ETDRS)      Right Left   Dist [place] 20/30ecc +1 20/40   Dist ph [place] NI 20/30 -2       Tonometry (Tonopen, 2:56 PM)      Right Left   Pressure 14 19       Pupils      Pupils Dark Light Shape React APD   Right PERRL 4 3 Round Brisk None   Left PERRL 4 3  Round Brisk None       Visual Fields (Counting fingers)      Left Right    Full Full       Extraocular Movement  Right Left    Full Full       Neuro/Psych    Oriented x3: Yes   Mood/Affect: Normal       Dilation    Left eye: 1.0% Mydriacyl, 2.5% Phenylephrine @ 2:56 PM          IMAGING AND PROCEDURES  Imaging and Procedures for 11/21/19  OCT, Retina - OU - Both Eyes       Right Eye Quality was good. Scan locations included subfoveal. Progression has been stable. Findings include normal observations.   Left Eye Quality was good. Scan locations included subfoveal. Progression has been stable. Findings include normal observations.   Notes No active diabetic maculopathy with proliferative diabetic retinopathy active       Panretinal Photocoagulation - OS - Left Eye       Time Out Confirmed correct patient, procedure, site, and patient consented.   Anesthesia Topical anesthesia was used. Anesthetic medications included Proparacaine 0.5%.   Laser Information The type of laser was diode. Color was yellow. The duration in seconds was 0.02. The spot size was 390 microns. Laser power was 280. Total spots was 687.   Post-op The patient tolerated the procedure well. There were no complications. The patient received written and verbal post procedure care education.                 ASSESSMENT/PLAN:  Proliferative diabetic retinopathy of left eye (HCC) We will plan   photocoagulation  PRP OS today to the superior portion of the retina which is untreated      ICD-10-CM   1. Proliferative diabetic retinopathy of right eye associated with type 2 diabetes mellitus, macular edema presence unspecified (HCC)  E11.3591 OCT, Retina - OU - Both Eyes    Panretinal Photocoagulation - OS - Left Eye  2. Vitreous hemorrhage of left eye (HCC)  H43.12 Panretinal Photocoagulation - OS - Left Eye  3. Retinal microaneurysm of both eyes  H35.043   4. Nuclear sclerotic  cataract of both eyes  H25.13   5. Proliferative diabetic retinopathy of left eye without macular edema associated with type 2 diabetes mellitus ([place])  J62.8366     1.  Retinal photocoagulation OS superiorly today  2.  3.  Ophthalmic Meds Ordered this visit:  No orders of the defined types were placed in this encounter.      Return in about 6 months (around 05/22/2020) for DILATE OU, COLOR FP.  There are no Patient Instructions on file for this visit.   Explained the diagnoses, plan, and follow up with the patient and they expressed understanding.  Patient expressed understanding of the importance of proper follow up care.   Clent Demark Erasto Sleight M.D. Diseases & Surgery of the Retina and Vitreous Retina & Diabetic [place] 11/21/19     Abbreviations: M myopia (nearsighted); A astigmatism; H hyperopia (farsighted); P presbyopia; Mrx spectacle prescription;  CTL contact lenses; OD right eye; OS left eye; OU both eyes  XT exotropia; ET esotropia; PEK punctate epithelial keratitis; PEE punctate epithelial erosions; DES dry eye syndrome; MGD meibomian gland dysfunction; ATs artificial tears; PFAT's preservative free artificial tears; [place] nuclear sclerotic cataract; PSC posterior subcapsular cataract; ERM epi-retinal membrane; PVD posterior vitreous detachment; RD retinal detachment; DM diabetes mellitus; DR diabetic retinopathy; NPDR non-proliferative diabetic retinopathy; PDR proliferative diabetic retinopathy; CSME clinically significant macular edema; DME diabetic macular edema; dbh dot blot hemorrhages; CWS cotton wool spot; POAG primary open angle glaucoma; C/D cup-to-disc ratio; HVF humphrey visual field; GVF  goldmann visual field; OCT optical coherence tomography; IOP intraocular pressure; BRVO Branch retinal vein occlusion; CRVO central retinal vein occlusion; CRAO central retinal artery occlusion; BRAO branch retinal artery occlusion; RT retinal tear; SB scleral buckle; PPV pars plana  vitrectomy; VH Vitreous hemorrhage; PRP panretinal laser photocoagulation; IVK intravitreal kenalog; VMT vitreomacular traction; MH Macular hole;  NVD neovascularization of the disc; NVE neovascularization elsewhere; AREDS age related eye disease study; ARMD age related macular degeneration; POAG primary open angle glaucoma; EBMD epithelial/anterior basement membrane dystrophy; ACIOL anterior chamber intraocular lens; IOL intraocular lens; PCIOL posterior chamber intraocular lens; Phaco/IOL phacoemulsification with intraocular lens placement; PRK photorefractive keratectomy; LASIK laser assisted in situ keratomileusis; HTN hypertension; DM diabetes mellitus; COPD chronic obstructive pulmonary disease 

## 2019-12-10 ENCOUNTER — Encounter: Payer: Self-pay | Admitting: Endocrinology

## 2019-12-10 ENCOUNTER — Ambulatory Visit: Payer: Medicaid Other | Admitting: Endocrinology

## 2019-12-10 ENCOUNTER — Other Ambulatory Visit: Payer: Self-pay

## 2019-12-10 VITALS — BP 130/74 | HR 75 | Ht 69.0 in

## 2019-12-10 DIAGNOSIS — Z794 Long term (current) use of insulin: Secondary | ICD-10-CM

## 2019-12-10 DIAGNOSIS — E1165 Type 2 diabetes mellitus with hyperglycemia: Secondary | ICD-10-CM

## 2019-12-10 DIAGNOSIS — E1142 Type 2 diabetes mellitus with diabetic polyneuropathy: Secondary | ICD-10-CM | POA: Diagnosis not present

## 2019-12-10 DIAGNOSIS — E039 Hypothyroidism, unspecified: Secondary | ICD-10-CM

## 2019-12-10 NOTE — Patient Instructions (Addendum)
Check blood sugars on waking up 7  days a week and bedtime daily  Also check blood sugars about 2 hours after meals and do this after different meals by rotation  Recommended blood sugar levels on waking up are 90-130 and about 2 hours after meal is 130-160  Please bring your blood sugar monitor to each visit, thank you  [place] for all meals and snacks  Basal 1.0 at 8 am  Check on coverage for Dash pump

## 2019-12-10 NOTE — Progress Notes (Signed)
Patient ID: WESS BANEY, male   DOB: 02-Mar-1961, 59 y.o.   MRN: [id number]   Reason for Appointment: follow-up   History of Present Illness   Diagnosis: Type 2 DIABETES MELITUS, date of diagnosis: 2000    Previous history: he has been on insulin for several years with consistently poor control Has been requiring large doses of insulin for his diabetes but A1c has been persistently high Blood sugars did not improve significantly even with trying Byetta and Victoza In 2014 he was switched from NovoLog to U-500 insulin but not clear if he has had improvement in control except with fasting readings His prior A1c was 14.0 in 5/14 and he had educational discussions with diabetes educator and dietitian in 6/14   He was started on the OMNIPOD pump on 02/06/2018  Recent history:   Non-insulin hypoglycemic drugs: Victoza 1.8 mg daily Insulin: Humulin R U-500   Omnipod PUMP settings:   Basal rate: Midnight = 0.85, 2 AM = 0.75, 8 AM = 0.9, 1 PM = 1.25  Preset boluses: 4-7 units with IC ratio 10 Blood sugar target 150 between 12 AM-7 AM and then 130 Sensitivity 1: 50, active insulin 6 hours  His A1c is recently 10.2 compared to 9%  Current management, blood sugar patterns and problems identified:  He was able to start his pump last month after getting the supplies and is here for follow-up  However he is checking his blood sugars only when he is eating a meal which will be at variable times  This is despite reminding him to check blood sugars after meals and bedtime consistently  His blood sugars fluctuate significantly as before  However AVERAGE blood sugars are better than when he was taking the injections  He has a couple of evenings where he is forgotten to bolus for his evening meal and this may have caused his morning readings to be 281 today  Otherwise the last 3 readings before his first meal are fairly good  Hypoglycemia occurred only once and he woke up with  a reading of 58 at 9 AM  He says he goes to bed around 3-4 AM and does not eat during the night  Still using mostly his Accu-Chek meter as he cannot get coverage for the freestyle test strips  Unable to check his weight   Mealtimes: 12 pm, 6 pm  Proper timing of medications in relation to meals: Yes, usually 30 minutes before eating .          Monitors blood glucose:  2-  4 times a day.    Glucometer: Accu-Chek  Blood sugar download results:   PRE-MEAL  morning Lunch Dinner Bedtime Overall  Glucose range:  58  76-413  86-356     Mean/median:   250  196  203   POST-MEAL PC Breakfast PC Lunch PC Dinner  Glucose range:   98  Mean/median:      Previous readings  PRE-MEAL  AC breakfast Lunch Dinner Bedtime Overall  Glucose range:  182-379   211-397  254-982  78-567  Mean/median:  340   317   333     Usually eating 2 meals a day with blood sugars improving after his first meal but not to normal Glucose data incomplete after evening meal but likely somewhat better with pre-meal blood sugar around 280-290   Physical activity: exercise:none   Last dietitian visit: 6/18 and last CDE visit in 4/16  Wt Readings from Last 3  Encounters:  12/18/18 283 lb (128.4 kg)  12/06/18 283 lb (128.4 kg)  12/04/18 283 lb (128.4 kg)    LABS:   Lab Results  Component Value Date   HGBA1C 10.2 (A) 10/29/2019   HGBA1C 9.0 (H) 06/10/2019   HGBA1C 8.7 (A) 02/19/2019   Lab Results  Component Value Date   MICROALBUR 16.7 (H) 08/19/2019   LDLCALC 61 04/22/2019   CREATININE 1.68 (H) 08/19/2019    Lab Results  Component Value Date   FRUCTOSAMINE 341 (H) 08/19/2019   FRUCTOSAMINE 333 (H) 04/22/2019   FRUCTOSAMINE 250 08/15/2018    OTHER active problems are discussed in review of systems   Allergies as of 12/10/2019   No Known Allergies     Medication List       Accurate as of Dec 10, 2019  3:49 PM. If you have any questions, ask your nurse or doctor.        aspirin 81 MG EC  tablet Take 1 tablet (81 mg total) by mouth 2 (two) times a day.   atorvastatin 80 MG tablet Commonly known as: LIPITOR Take 1 tablet (80 mg total) by mouth daily.   blood glucose meter kit and supplies Dispense based on patient and insurance preference. Use up to four times daily as directed. (FOR ICD-10 E10.9, E11.9).   carvedilol 6.25 MG tablet Commonly known as: COREG Take 1 tablet (6.25 mg total) by mouth 2 (two) times daily with a meal.   DULoxetine 60 MG capsule Commonly known as: CYMBALTA Take 1 capsule (60 mg total) by mouth daily.   fenofibrate 145 MG tablet Commonly known as: TRICOR Take 1 tablet (145 mg total) by mouth daily.   freestyle lancets Use as instructed   furosemide 40 MG tablet Commonly known as: LASIX Take 40 mg by mouth.   gabapentin 300 MG capsule Commonly known as: NEURONTIN Take 300 mg by mouth 3 (three) times daily. Take 359m by mouth three times daily.   glucagon 1 MG injection Follow package directions for low blood sugar.   glucose blood test strip Commonly known as: Accu-Chek Aviva Plus TEST UP TO 4 TIMES DAILY Dx code E11.65   glucose blood test strip Commonly known as: FREESTYLE LITE Check sugar 3 times daily   HUMULIN R 500 UNIT/ML injection Generic drug: insulin regular human CONCENTRATED Use up to 1 mL to fill insulin pump daily   Insulin Pen Needle 32G X 8 MM Misc Use as directed   levothyroxine 112 MCG tablet Commonly known as: SYNTHROID Take 1 tablet (112 mcg total) by mouth daily.   meclizine 25 MG tablet Commonly known as: ANTIVERT Take 1 tablet (25 mg total) by mouth every 8 (eight) hours as needed for dizziness.   OmniPod 10 Pack Misc by Does not apply route.   polyethylene glycol 17 g packet Commonly known as: MIRALAX / GLYCOLAX Take 17 g by mouth daily as needed for mild constipation.   Victoza 18 MG/3ML Sopn Generic drug: liraglutide Inject 0.3 mLs (1.8 mg total) into the skin daily.        Allergies: No Known Allergies  Past Medical History:  Diagnosis Date  . Arm vein blood clot, left    on Coumadin  . Arthritis   . 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 Chronic kidney disease    stage 3  . Depression   . Essential hypertension   . Family history of adverse reaction to anesthesia    "  my mother woke up during a cardiac surgery "  . History of stroke   . Hyperlipidemia   . Hypothyroidism   . Insomnia   . Obesity   . Precordial pain June 2011   Nuclear stress; no ischemia; EF 60%  . Presence of permanent cardiac pacemaker   . Proteinuria   . Seizure disorder ([place])   . Sick sinus syndrome ([place])   . Sleep apnea    Dr. Brandon Melnick, uses bipap  . Syncope   . Type 2 diabetes mellitus ([place])   . Venous insufficiency     Past Surgical History:  Procedure Laterality Date  . AMPUTATION Right 06/20/2018   Procedure: RIGHT GREAT TOE AMPUTATION;  Surgeon: Newt Minion, MD;  Location: [place];  Service: Orthopedics;  Laterality: Right;  . AMPUTATION Right 09/26/2018   Procedure: RIGHT FOOT 5TH RAY AMPUTATION;  Surgeon: Newt Minion, MD;  Location: [place];  Service: Orthopedics;  Laterality: Right;  MAC and regional anesthesia  . AMPUTATION Right 10/05/2018   Procedure: RIGHT BELOW KNEE AMPUTATION;  Surgeon: Newt Minion, MD;  Location: [place];  Service: Orthopedics;  Laterality: Right;  . AMPUTATION Right 10/26/2018   Procedure: RIGHT ABOVE KNEE AMPUTATION;  Surgeon: Newt Minion, MD;  Location: [place];  Service: Orthopedics;  Laterality: Right;  . COLONOSCOPY    . RESECTION BONE TUMOR FEMUR  1980's   Left femur, [facility] at [facility] with bone graft  . STUMP REVISION Right 11/09/2018   Procedure: REVISION RIGHT ABOVE KNEE AMPUTATION;  Surgeon: Newt Minion, MD;  Location: [place];  Service: Orthopedics;  Laterality: Right;  . TOTAL KNEE ARTHROPLASTY Left 2013    Family History  Problem Relation Age of Onset  . Heart attack Mother 25  . Breast cancer  Mother   . Stroke Mother   . Heart attack Father 14  . Breast cancer Sister   . Colon cancer Sister     Social History:  reports that he has quit smoking. His smoking use included cigarettes. He has never used smokeless tobacco. He reports that he does not drink alcohol or use drugs.  Review of Systems:    HYPOTHYROIDISM:   on  levothyroxine 112 ug daily The dose was increased in 1/21 when TSH had gone up further  Lab Results  Component Value Date   TSH 6.66 (H) 08/19/2019   TSH 3.89 06/10/2019   TSH 2.47 04/22/2019   FREET4 0.95 08/19/2019   FREET4 0.93 06/10/2019   FREET4 1.17 02/19/2019     Lipids: triglycerides on labs on the last measurement are below 150 Lipids excellent as of the last visit He is on atorvastatin 80 mg and fenofibrate  Followed by PCP   Lab Results  Component Value Date   CHOL 114 04/22/2019   HDL 37.70 (L) 04/22/2019   LDLCALC 61 04/22/2019   LDLDIRECT 80.0 03/07/2016   TRIG 76.0 04/22/2019   CHOLHDL 3 04/22/2019      CKD: His creatinine has been In the 2-3 range in the past but recently slightly better   Lab Results  Component Value Date   CREATININE 1.68 (H) 08/19/2019   History of CHF, taking Lasix using 40 mg tablet  He says he is having more seizures now, followed by neurologist  Examination:   BP 130/74 (BP Location: Left Arm, Patient Position: Sitting, Cuff Size: Large)   Pulse 75   Ht 5' 9" (1.753 m)   SpO2 98%   BMI 41.79 kg/m  Body mass index is 41.79 kg/m.     ASSESSMENT/ PLAN:   Diabetes type 2 with obesity, insulin-dependent  See history of present illness for detailed discussion of current diabetes management, blood sugar patterns and problems identified  He has been on OMNIPOD insulin pump with U-500 insulin  A1c is last higher at 10.2  With increasing his basal rates and restarting the insulin pump his blood sugars appear to be overall better However still averaging 200 He is still not monitoring  blood sugars enough and only about twice a day on an average, not clear what his postprandial readings are especially at night Even though he is using the Accu-Chek meter he has more blood sugars entered in his pump than on his meter Not clear why his premeal readings at midday are fluctuating significantly Also occasionally has been placed boluses He is not able to get his CGM covered by his insurance  Recommendations:  Check blood sugar consistently 3 times a day with some readings 2 hours after dinner or even at bedtime  Make sure he boluses 30-minute before each meal including at suppertime and not miss any boluses  Take correction dose for any significantly high readings on waking up for at bedtime  Basal rate 1.0 at 8 AM with midday readings  Consider changing correction factor which he did not reprogram on the last visit Low-fat meals Check fructosamine to assess short-term control  HYPOTHYROIDISM: Recheck thyroid levels today  Lipids: Needs periodic follow-up  Renal dysfunction: To follow-up with nephrologist as scheduled  Patient Instructions  Check blood sugars on waking up 7  days a week and bedtime daily  Also check blood sugars about 2 hours after meals and do this after different meals by rotation  Recommended blood sugar levels on waking up are 90-130 and about 2 hours after meal is 130-160  Please bring your blood sugar monitor to each visit, thank you  Bolus for all meals and snacks  Basal 1.0 at 8 am  Check on coverage for Dash pump              Elayne Snare 12/10/2019, 3:49 PM   Note: This office note was prepared with Dragon voice recognition system technology. Any transcriptional errors that result from this process are unintentional.

## 2020-01-23 ENCOUNTER — Telehealth: Payer: Self-pay

## 2020-01-23 NOTE — Telephone Encounter (Signed)
FAXED [place]: [employer] (Faxed on 01/22/20) Document: Chart notes from last office visit Other records requested: none at this time.  All above requested information has been faxed successfully to [employer] listed above. Documents and fax confirmation have been placed in the faxed file for future reference.

## 2020-01-23 NOTE — Telephone Encounter (Signed)
FAXED [place]: [employer] (Faxed on 01/23/20) Document: Standard written order for omnipod supplies Other records requested: none at this time.  All above requested information has been faxed successfully to [employer] listed above. Documents and fax confirmation have been placed in the faxed file for future reference.

## 2020-02-14 ENCOUNTER — Other Ambulatory Visit: Payer: Self-pay | Admitting: Endocrinology

## 2020-03-09 ENCOUNTER — Other Ambulatory Visit: Payer: Self-pay

## 2020-03-09 ENCOUNTER — Other Ambulatory Visit (INDEPENDENT_AMBULATORY_CARE_PROVIDER_SITE_OTHER): Payer: Medicaid Other

## 2020-03-09 DIAGNOSIS — E039 Hypothyroidism, unspecified: Secondary | ICD-10-CM | POA: Diagnosis not present

## 2020-03-09 DIAGNOSIS — Z794 Long term (current) use of insulin: Secondary | ICD-10-CM | POA: Diagnosis not present

## 2020-03-09 DIAGNOSIS — E1165 Type 2 diabetes mellitus with hyperglycemia: Secondary | ICD-10-CM

## 2020-03-09 LAB — GLUCOSE, RANDOM: Glucose, Bld: 95 mg/dL (ref 70–99)

## 2020-03-09 LAB — TSH: TSH: 2.7 u[IU]/mL (ref 0.35–4.50)

## 2020-03-09 LAB — T4, FREE: Free T4: 0.95 ng/dL (ref 0.60–1.60)

## 2020-03-10 ENCOUNTER — Other Ambulatory Visit: Payer: Self-pay | Admitting: Endocrinology

## 2020-03-10 DIAGNOSIS — E1165 Type 2 diabetes mellitus with hyperglycemia: Secondary | ICD-10-CM

## 2020-03-10 DIAGNOSIS — Z794 Long term (current) use of insulin: Secondary | ICD-10-CM

## 2020-03-10 LAB — FRUCTOSAMINE: Fructosamine: 280 umol/L (ref 0–285)

## 2020-03-10 LAB — HEMOGLOBIN A1C: Hgb A1c MFr Bld: 8 % — ABNORMAL HIGH (ref 4.6–6.5)

## 2020-03-12 ENCOUNTER — Other Ambulatory Visit: Payer: Self-pay

## 2020-03-12 ENCOUNTER — Ambulatory Visit: Payer: Medicaid Other | Admitting: Endocrinology

## 2020-03-12 ENCOUNTER — Encounter: Payer: Self-pay | Admitting: Endocrinology

## 2020-03-12 VITALS — BP 140/78 | HR 84 | Ht 69.0 in

## 2020-03-12 DIAGNOSIS — E1165 Type 2 diabetes mellitus with hyperglycemia: Secondary | ICD-10-CM | POA: Diagnosis not present

## 2020-03-12 DIAGNOSIS — Z794 Long term (current) use of insulin: Secondary | ICD-10-CM

## 2020-03-12 DIAGNOSIS — E782 Mixed hyperlipidemia: Secondary | ICD-10-CM | POA: Diagnosis not present

## 2020-03-12 DIAGNOSIS — E063 Autoimmune thyroiditis: Secondary | ICD-10-CM | POA: Diagnosis not present

## 2020-03-12 DIAGNOSIS — Z7189 Other specified counseling: Secondary | ICD-10-CM | POA: Diagnosis not present

## 2020-03-12 DIAGNOSIS — S88111A Complete traumatic amputation at level between knee and ankle, right lower leg, initial encounter: Secondary | ICD-10-CM

## 2020-03-12 NOTE — Patient Instructions (Signed)
Bolus for hi sugars in pms

## 2020-03-12 NOTE — Progress Notes (Signed)
Patient ID: Ethan Campbell, male   DOB: [birth year], 59 y.o.   MRN: [id number]   Reason for Appointment: follow-up   History of Present Illness   Diagnosis: Type 2 DIABETES MELITUS, date of diagnosis: 2000    Previous history: he has been on insulin for several years with consistently poor control Has been requiring large doses of insulin for his diabetes but A1c has been persistently high Blood sugars did not improve significantly even with trying Byetta and Victoza In 2014 he was switched from NovoLog to U-500 insulin but not clear if he has had improvement in control except with fasting readings His prior A1c was 14.0 in 5/14 and he had educational discussions with diabetes educator and dietitian in 6/14   He was started on the OMNIPOD pump on 02/06/2018  Recent history:  Non-insulin hypoglycemic drugs: Victoza 1.8 mg daily Insulin: Humulin R U-500   Omnipod PUMP settings:   Basal rate: Midnight = 0.85, 2 AM = 0.75, 8 AM = 0.9, 1 PM = 1.25  Preset boluses: 4-7 units with IC ratio 10 Blood sugar target 150 between 12 AM-7 AM and then 130 Sensitivity 1: 50, active insulin 6 hours  His A1c is now 8%, previously 10.2 and 9%  Current management, blood sugar patterns and problems identified:  He appears to be only bolusing once a day most of the time and not clear why he is not covering all his meals  He says that he is sometimes not eating much in the evening but also not entering blood sugars in the pump consistently  Still using his Accu-Chek meter as he cannot get coverage for the freestyle test strips, most of his blood sugars are being entered in the morning which can be at variable times  He says that freestyle Elenor Legato was denied by his insurance and not clear why  Appears to have variable readings in the morning  Blood sugars are mostly higher in the evenings  Also not clear if he is getting control of postprandial reading  Today blood sugar before coming  in was 340 after eating cereal without any bolus coverage  Unable to check his weight   Mealtimes: 12 pm, 6 pm  Proper timing of medications in relation to meals: Yes, usually 30 minutes before eating .          Monitors blood glucose:  2-  4 times a day.    Glucometer: Accu-Chek  Blood sugar download results:    PRE-MEAL Fasting  12-5 PM Dinner Bedtime Overall  Glucose range:  78-295  66-312  107-294  118-354   Mean/median:  165  179  200  226  194   Previous readings:  PRE-MEAL  morning Lunch Dinner Bedtime Overall  Glucose range:  58  76-413  86-356     Mean/median:   250  196  203   POST-MEAL PC Breakfast PC Lunch PC Dinner  Glucose range:   98  Mean/median:        Usually eating 2 meals a day with blood sugars improving after his first meal but not to normal Glucose data incomplete after evening meal but likely somewhat better with pre-meal blood sugar around 280-290   Physical activity: exercise:none   Last dietitian visit: 6/18 and last CDE visit in 4/16  Wt Readings from Last 3 Encounters:  12/18/18 283 lb (128.4 kg)  12/06/18 283 lb (128.4 kg)  12/04/18 283 lb (128.4 kg)    LABS:  Lab Results  Component Value Date   HGBA1C 8.0 (H) 03/10/2020   HGBA1C 10.2 (A) 10/29/2019   HGBA1C 9.0 (H) 06/10/2019   Lab Results  Component Value Date   MICROALBUR 16.7 (H) 08/19/2019   LDLCALC 61 04/22/2019   CREATININE 1.68 (H) 08/19/2019    Lab Results  Component Value Date   FRUCTOSAMINE 280 03/09/2020   FRUCTOSAMINE 341 (H) 08/19/2019   FRUCTOSAMINE 333 (H) 04/22/2019    OTHER active problems are discussed in review of systems   Allergies as of 03/12/2020   No Known Allergies     Medication List       Accurate as of March 12, 2020  2:55 PM. If you have any questions, ask your nurse or doctor.        aspirin 81 MG EC tablet Take 1 tablet (81 mg total) by mouth 2 (two) times a day.   atorvastatin 80 MG tablet Commonly known as:  LIPITOR Take 1 tablet (80 mg total) by mouth daily.   blood glucose meter kit and supplies Dispense based on patient and insurance preference. Use up to four times daily as directed. (FOR ICD-10 E10.9, E11.9).   carvedilol 6.25 MG tablet Commonly known as: COREG Take 1 tablet (6.25 mg total) by mouth 2 (two) times daily with a meal.   DULoxetine 60 MG capsule Commonly known as: CYMBALTA Take 1 capsule (60 mg total) by mouth daily.   fenofibrate 145 MG tablet Commonly known as: TRICOR Take 1 tablet (145 mg total) by mouth daily.   freestyle lancets Use as instructed   furosemide 40 MG tablet Commonly known as: LASIX Take 40 mg by mouth.   gabapentin 300 MG capsule Commonly known as: NEURONTIN Take 300 mg by mouth 3 (three) times daily. Take 316m by mouth three times daily.   glucagon 1 MG injection Follow package directions for low blood sugar.   glucose blood test strip Commonly known as: Accu-Chek Aviva Plus TEST UP TO 4 TIMES DAILY Dx code E11.65   glucose blood test strip Commonly known as: FREESTYLE LITE Check sugar 3 times daily   HUMULIN R 500 UNIT/ML injection Generic drug: insulin regular human CONCENTRATED Use up to 1 mL to fill insulin pump daily   Insulin Pen Needle 32G X 8 MM Misc Use as directed   levothyroxine 112 MCG tablet Commonly known as: SYNTHROID TAKE (1) TABLET BY MOUTH ONCE DAILY BEFORE BREAKFAST.   meclizine 25 MG tablet Commonly known as: ANTIVERT Take 1 tablet (25 mg total) by mouth every 8 (eight) hours as needed for dizziness.   OmniPod 10 Pack Misc by Does not apply route.   polyethylene glycol 17 g packet Commonly known as: MIRALAX / GLYCOLAX Take 17 g by mouth daily as needed for mild constipation.   Victoza 18 MG/3ML Sopn Generic drug: liraglutide Inject 0.3 mLs (1.8 mg total) into the skin daily.       Allergies: No Known Allergies  Past Medical History:  Diagnosis Date  . Arm vein blood clot, left    on  Coumadin  . Arthritis   . [place]    Bone cancer 1987 (in knee Large cell tumor)  . Chronic kidney disease    stage 3  . Depression   . Essential hypertension   . Family history of adverse reaction to anesthesia    " my mother woke up during a cardiac surgery "  . History of stroke   . Hyperlipidemia   . Hypothyroidism   .  Insomnia   . Obesity   . Precordial pain June 2011   Nuclear stress; no ischemia; EF 60%  . Presence of permanent cardiac pacemaker   . Proteinuria   . Seizure disorder ([place])   . Sick sinus syndrome ([place])   . Sleep apnea    Dr. Brandon Melnick, uses bipap  . Syncope   . Type 2 diabetes mellitus ([place])   . Venous insufficiency     Past Surgical History:  Procedure Laterality Date  . AMPUTATION Right 06/20/2018   Procedure: RIGHT GREAT TOE AMPUTATION;  Surgeon: Newt Minion, MD;  Location: [place];  Service: Orthopedics;  Laterality: Right;  . AMPUTATION Right 09/26/2018   Procedure: RIGHT FOOT 5TH RAY AMPUTATION;  Surgeon: Newt Minion, MD;  Location: [place];  Service: Orthopedics;  Laterality: Right;  MAC and regional anesthesia  . AMPUTATION Right 10/05/2018   Procedure: RIGHT BELOW KNEE AMPUTATION;  Surgeon: Newt Minion, MD;  Location: [place];  Service: Orthopedics;  Laterality: Right;  . AMPUTATION Right 10/26/2018   Procedure: RIGHT ABOVE KNEE AMPUTATION;  Surgeon: Newt Minion, MD;  Location: [place];  Service: Orthopedics;  Laterality: Right;  . COLONOSCOPY    . RESECTION BONE TUMOR FEMUR  1980's   Left femur[facility] graft  . STUMP REVISION Right 11/09/2018   Procedure: REVISION RIGHT ABOVE KNEE AMPUTATION;  Surgeon: Newt Minion, MD;  Location: [place];  Service: Orthopedics;  Laterality: Right;  . TOTAL KNEE ARTHROPLASTY Left 2013    Family History  Problem Relation Age of Onset  . Heart attack Mother 93  . Breast cancer Mother   . Stroke Mother   . Heart attack Father 74  . Breast cancer Sister   . Colon cancer Sister      Social History:  reports that he has quit smoking. His smoking use included cigarettes. He has never used smokeless tobacco. He reports that he does not drink alcohol and does not use drugs.  Review of Systems:    HYPOTHYROIDISM:   on  levothyroxine 112 ug daily The dose was increased in 1/21 when TSH was 6.7 but now it is back to normal He takes levothyroxine consistently  Lab Results  Component Value Date   TSH 2.70 03/09/2020   TSH 6.66 (H) 08/19/2019   TSH 3.89 06/10/2019   FREET4 0.95 03/09/2020   FREET4 0.95 08/19/2019   FREET4 0.93 06/10/2019     Lipids: triglycerides on labs on the last measurement are below 150 LDL has been controlled consistently He is on atorvastatin 80 mg, not clear if he is taking fenofibrate  Followed by PCP   Lab Results  Component Value Date   CHOL 114 04/22/2019   HDL 37.70 (L) 04/22/2019   LDLCALC 61 04/22/2019   LDLDIRECT 80.0 03/07/2016   TRIG 76.0 04/22/2019   CHOLHDL 3 04/22/2019      CKD: His creatinine has been variable and recently about 1.6 done by her nephrologist   Lab Results  Component Value Date   CREATININE 1.68 (H) 08/19/2019   History of CHF, taking Lasix using 40 mg tablet  He has not had the Covid vaccine and also not had a previous infection He is not on convinced about the safety and is still afraid of taking this vaccine for no specific reason, stating that he thinks it is too new. Detailed counseling done regarding nature of the vaccine, efficacy, need for protection against severe disease which can be  life-threatening or disabling as well as current state of the pandemic worsening to alarming levels. At least 10 minutes spent on counseling   Examination:   BP 140/78 (BP Location: Left Arm, Patient Position: Sitting, Cuff Size: Large)   Pulse 84   Ht _0  (1.753 m)   SpO2 94%   BMI 41.79 kg/m   Body mass index is 41.79 kg/m.     ASSESSMENT/ PLAN:   Diabetes type 2 with obesity,  insulin-dependent  See history of present illness for detailed discussion of current diabetes management, blood sugar patterns and problems identified  He has been on OMNIPOD insulin pump with U-500 insulin  A1c is 8%  As discussed above he is not bolusing consistently for all his meals and high sugars and not entering blood sugars enough in the pump Also blood sugars are mostly high in the evening when he checks them  He is not able to get his CGM covered by his insurance previously but will try to get the freestyle libre based on the current recommendations and approval by Medicaid  Recommendations:  Check blood sugar on waking up, before and after each meal  Even if he is not eating significant carbohydrates in the evening he will enter the blood sugars to do a correction bolus for blood sugars that are above target  Start using freestyle libre when available and will also need to have him compare blood sugars with fingerstick at the same time  May need to adjust carbohydrate ratio if postprandial readings are consistently higher  Avoid high-fat or high calorie foods.  Cover all snacks and if eating cereal may need to take additional coverage than current carbohydrate ratio 1:10  HYPOTHYROIDISM: Well-controlled with 112 levothyroxine  Lipids: Needs follow-up, also consider adjusting dose of fenofibrate if he is taking  Renal dysfunction: To follow-up with nephrologist regularly  Counseling regarding Covid vaccination: Patient information given and he will also consider doing this based on information discussed in the office today, he thinks his pharmacy may be able to do this  There are no Patient Instructions on file for this visit.         Elayne Snare 03/12/2020, 2:55 PM   Note: This office note was prepared with Dragon voice recognition system technology. Any transcriptional errors that result from this process are unintentional.

## 2020-03-17 ENCOUNTER — Encounter: Payer: Self-pay | Admitting: Neurology

## 2020-04-13 ENCOUNTER — Other Ambulatory Visit: Payer: Self-pay | Admitting: Endocrinology

## 2020-04-22 ENCOUNTER — Telehealth: Payer: Self-pay | Admitting: *Deleted

## 2020-04-22 NOTE — Telephone Encounter (Signed)
[employer]/sent complete form to ( [employer] [facility], [phone]

## 2020-05-21 ENCOUNTER — Encounter (INDEPENDENT_AMBULATORY_CARE_PROVIDER_SITE_OTHER): Payer: Medicaid Other | Admitting: Ophthalmology

## 2020-05-22 ENCOUNTER — Encounter (INDEPENDENT_AMBULATORY_CARE_PROVIDER_SITE_OTHER): Payer: Medicaid Other | Admitting: Ophthalmology

## 2020-06-15 ENCOUNTER — Encounter: Payer: Self-pay | Admitting: Endocrinology

## 2020-06-15 ENCOUNTER — Ambulatory Visit (INDEPENDENT_AMBULATORY_CARE_PROVIDER_SITE_OTHER): Payer: Medicaid Other | Admitting: Endocrinology

## 2020-06-15 ENCOUNTER — Other Ambulatory Visit: Payer: Self-pay

## 2020-06-15 VITALS — BP 142/88 | HR 69 | Ht 69.0 in

## 2020-06-15 DIAGNOSIS — S88111A Complete traumatic amputation at level between knee and ankle, right lower leg, initial encounter: Secondary | ICD-10-CM

## 2020-06-15 DIAGNOSIS — E1165 Type 2 diabetes mellitus with hyperglycemia: Secondary | ICD-10-CM

## 2020-06-15 DIAGNOSIS — E039 Hypothyroidism, unspecified: Secondary | ICD-10-CM

## 2020-06-15 DIAGNOSIS — T383X5A Adverse effect of insulin and oral hypoglycemic [antidiabetic] drugs, initial encounter: Secondary | ICD-10-CM

## 2020-06-15 DIAGNOSIS — E1142 Type 2 diabetes mellitus with diabetic polyneuropathy: Secondary | ICD-10-CM | POA: Diagnosis not present

## 2020-06-15 DIAGNOSIS — E160 Drug-induced hypoglycemia without coma: Secondary | ICD-10-CM

## 2020-06-15 DIAGNOSIS — IMO0002 Reserved for concepts with insufficient information to code with codable children: Secondary | ICD-10-CM

## 2020-06-15 LAB — POCT GLYCOSYLATED HEMOGLOBIN (HGB A1C): Hemoglobin A1C: 7.2 % — AB (ref 4.0–5.6)

## 2020-06-15 MED ORDER — GVOKE HYPOPEN 2-PACK 0.5 MG/0.1ML ~~LOC~~ SOAJ: SUBCUTANEOUS | 1 refills | Status: DC

## 2020-06-15 MED ORDER — FREESTYLE LIBRE 14 DAY SENSOR MISC: 1.0000 [IU] | 4 refills | Status: DC

## 2020-06-15 NOTE — Progress Notes (Signed)
Patient ID: Ethan Campbell, male   DOB: [birth year], 59 y.o.   MRN: [id number]   Reason for Appointment: follow-up   History of Present Illness   Diagnosis: Type 2 DIABETES MELITUS, date of diagnosis: 2000    Previous history: he has been on insulin for several years with consistently poor control Has been requiring large doses of insulin for his diabetes but A1c has been persistently high Blood sugars did not improve significantly even with trying Byetta and Victoza In 2014 he was switched from NovoLog to U-500 insulin but not clear if he has had improvement in control except with fasting readings His prior A1c was 14.0 in 5/14 and he had educational discussions with diabetes educator and dietitian in 6/14   He was started on the OMNIPOD pump on 02/06/2018  Recent history:  Non-insulin hypoglycemic drugs: Victoza 1.8 mg daily Insulin: Humulin R U-500   Omnipod PUMP settings:   Basal rate: Midnight = 0.85, 2 AM = 0.75, 8 AM = 0.9, 1 PM = 1.25  Preset boluses: 4-7 units with IC ratio 10 Blood sugar target 150 between 12 AM-7 AM and then 130 Sensitivity 1: 50, active insulin 6 hours  His A1c is now 7.2 and progressively improving  Highest this year 10.2  Current management, blood sugar patterns and problems identified:  He still has not been able to get the freestyle libre  He says that he is sometimes not sleeping through the night because of insomnia and may not wake up until about midday  He had dysfunction with his pump last month but even though he has a replacement he is still using the old unit  For some reason basal settings appear to be quite different compared to the last visit and not clear why  Also in the last few days his basal rates have been somewhat inconsistent according to the pump data and not clear why they are different from day-to-day especially in the last 4 days with occasional breaks in the basal delivery  His bolus data indicates that he  is not always bolus doing it twice a day even though he is usually eating 2 meals a day  Yesterday blood sugar was significantly high late at night but he had no bolus for lunch and had [place] fries for dinner  He says that he had severe hypoglycemic episodes in the late afternoon last month and apparently was not eating much, unclear what his boluses were those days when he had EMT called in at his beach vacation  Unable to check his weight   Mealtimes: 12 pm, 6 pm  Proper timing of medications in relation to meals: Yes, usually 30 minutes before eating .          Monitors blood glucose:  2-  4 times a day.    Glucometer: Accu-Chek  Blood sugar download results:   PRE-MEAL Fasting Lunch Dinner Bedtime Overall  Glucose range: 75-271  134-243 ?   Mean/median:  140   195   182   POST-MEAL PC Breakfast PC Lunch PC Dinner  Glucose range:   213-348  Mean/median:    230   Previous data:  PRE-MEAL Fasting  12-5 PM Dinner Bedtime Overall  Glucose range:  78-295  66-312  107-294  118-354   Mean/median:  165  179  200  226  194     Usually eating 2 meals a day with blood sugars improving after his first meal but not to normal  Glucose data incomplete after evening meal but likely somewhat better with pre-meal blood sugar around 280-290   Physical activity: exercise:none   Last dietitian visit: 6/18 and last CDE visit in 4/16  Wt Readings from Last 3 Encounters:  12/18/18 283 lb (128.4 kg)  12/06/18 283 lb (128.4 kg)  12/04/18 283 lb (128.4 kg)    LABS:   Lab Results  Component Value Date   HGBA1C 7.2 (A) 06/15/2020   HGBA1C 8.0 (H) 03/10/2020   HGBA1C 10.2 (A) 10/29/2019   Lab Results  Component Value Date   MICROALBUR 16.7 (H) 08/19/2019   LDLCALC 61 04/22/2019   CREATININE 1.68 (H) 08/19/2019    Lab Results  Component Value Date   FRUCTOSAMINE 280 03/09/2020   FRUCTOSAMINE 341 (H) 08/19/2019   FRUCTOSAMINE 333 (H) 04/22/2019    OTHER active problems are  discussed in review of systems   Allergies as of 06/15/2020   No Known Allergies     Medication List       Accurate as of June 15, 2020  4:35 PM. If you have any questions, ask your nurse or doctor.        STOP taking these medications   glucagon 1 MG injection Stopped by: Elayne Snare, MD     TAKE these medications   aspirin 81 MG EC tablet Take 1 tablet (81 mg total) by mouth 2 (two) times a day.   atorvastatin 80 MG tablet Commonly known as: LIPITOR Take 1 tablet (80 mg total) by mouth daily.   blood glucose meter kit and supplies Dispense based on patient and insurance preference. Use up to four times daily as directed. (FOR ICD-10 E10.9, E11.9).   carvedilol 6.25 MG tablet Commonly known as: COREG Take 1 tablet (6.25 mg total) by mouth 2 (two) times daily with a meal.   DULoxetine 60 MG capsule Commonly known as: CYMBALTA Take 1 capsule (60 mg total) by mouth daily.   fenofibrate 145 MG tablet Commonly known as: TRICOR Take 1 tablet (145 mg total) by mouth daily.   freestyle lancets Use as instructed   furosemide 40 MG tablet Commonly known as: LASIX Take 40 mg by mouth.   gabapentin 300 MG capsule Commonly known as: NEURONTIN Take 300 mg by mouth 3 (three) times daily. Take 337m by mouth three times daily.   glucose blood test strip Commonly known as: Accu-Chek Aviva Plus TEST UP TO 4 TIMES DAILY Dx code E11.65   glucose blood test strip Commonly known as: FREESTYLE LITE Check sugar 3 times daily   Gvoke HypoPen 2-Pack 0.5 MG/0.1ML Soaj Generic drug: Glucagon Inject SQ for severe Low sugars Started by: AElayne Snare MD   HUMULIN R 500 UNIT/ML injection Generic drug: insulin regular human CONCENTRATED USE AS DIRECTED, INJECT UP TO 30 UNITS 3 TIMES A DAY.   Insulin Pen Needle 32G X 8 MM Misc Use as directed   levothyroxine 112 MCG tablet Commonly known as: SYNTHROID TAKE (1) TABLET BY MOUTH ONCE DAILY BEFORE BREAKFAST.   meclizine 25  MG tablet Commonly known as: ANTIVERT Take 1 tablet (25 mg total) by mouth every 8 (eight) hours as needed for dizziness.   OmniPod 10 Pack Misc by Does not apply route.   polyethylene glycol 17 g packet Commonly known as: MIRALAX / GLYCOLAX Take 17 g by mouth daily as needed for mild constipation.   Victoza 18 MG/3ML Sopn Generic drug: liraglutide Inject 0.3 mLs (1.8 mg total) into the skin daily.  Allergies: No Known Allergies  Past Medical History:  Diagnosis Date  . Arm vein blood clot, left    on Coumadin  . Arthritis   . Cancer ([place])    Bone cancer 1987 (in knee Large cell tumor)  . Chronic kidney disease    stage 3  . Depression   . Essential hypertension   . Family history of adverse reaction to anesthesia    " my mother woke up during a cardiac surgery "  . History of stroke   . Hyperlipidemia   . Hypothyroidism   . Insomnia   . Obesity   . Precordial pain June 2011   Nuclear stress; no ischemia; EF 60%  . Presence of permanent cardiac pacemaker   . Proteinuria   . Seizure disorder ([place])   . Sick sinus syndrome ([place])   . Sleep apnea    Dr. Brandon Melnick, uses bipap  . Syncope   . Type 2 diabetes mellitus ([place])   . Venous insufficiency     Past Surgical History:  Procedure Laterality Date  . AMPUTATION Right 06/20/2018   Procedure: RIGHT GREAT TOE AMPUTATION;  Surgeon: Newt Minion, MD;  Location: [place];  Service: Orthopedics;  Laterality: Right;  . AMPUTATION Right 09/26/2018   Procedure: RIGHT FOOT 5TH RAY AMPUTATION;  Surgeon: Newt Minion, MD;  Location: [place];  Service: Orthopedics;  Laterality: Right;  MAC and regional anesthesia  . AMPUTATION Right 10/05/2018   Procedure: RIGHT BELOW KNEE AMPUTATION;  Surgeon: Newt Minion, MD;  Location: [place];  Service: Orthopedics;  Laterality: Right;  . AMPUTATION Right 10/26/2018   Procedure: RIGHT ABOVE KNEE AMPUTATION;  Surgeon: Newt Minion, MD;  Location: [place];  Service: Orthopedics;  Laterality:  Right;  . COLONOSCOPY    . RESECTION BONE TUMOR FEMUR  1980's   Left femur, treated at [facility] with bone graft  . STUMP REVISION Right 11/09/2018   Procedure: REVISION RIGHT ABOVE KNEE AMPUTATION;  Surgeon: Newt Minion, MD;  Location: [place];  Service: Orthopedics;  Laterality: Right;  . TOTAL KNEE ARTHROPLASTY Left 2013    Family History  Problem Relation Age of Onset  . Heart attack Mother 43  . Breast cancer Mother   . Stroke Mother   . Heart attack Father 81  . Breast cancer Sister   . Colon cancer Sister     Social History:  reports that he has quit smoking. His smoking use included cigarettes. He has never used smokeless tobacco. He reports that he does not drink alcohol and does not use drugs.  Review of Systems:    HYPOTHYROIDISM:   on  levothyroxine 112 ug daily The dose was increased in 1/21 when TSH was 6.7 but now it is back to normal He takes levothyroxine consistently  Lab Results  Component Value Date   TSH 2.70 03/09/2020   TSH 6.66 (H) 08/19/2019   TSH 3.89 06/10/2019   FREET4 0.95 03/09/2020   FREET4 0.95 08/19/2019   FREET4 0.93 06/10/2019     Lipids: triglycerides on labs on the last measurement are below 150 LDL has been controlled consistently He is on atorvastatin 80 mg,  Followed by PCP   Lab Results  Component Value Date   CHOL 114 04/22/2019   HDL 37.70 (L) 04/22/2019   LDLCALC 61 04/22/2019   LDLDIRECT 80.0 03/07/2016   TRIG 76.0 04/22/2019   CHOLHDL 3 04/22/2019      CKD: His creatinine has been variable and was  about 2.0 in September   Lab Results  Component Value Date   CREATININE 1.68 (H) 08/19/2019   History of CHF, taking Lasix using 40 mg tablet  He has not had the Covid vaccine and also not had a previous infection Had been counseled on his previous visit   Examination:   BP (!) 142/88   Pulse 69   Ht _0  (1.753 m)   SpO2 95%   BMI 41.79 kg/m   Body mass index is 41.79 kg/m.     ASSESSMENT/ PLAN:    Diabetes type 2 with obesity, insulin-dependent  See history of present illness for detailed discussion of current diabetes management, blood sugar patterns and problems identified  He has been on OMNIPOD insulin pump with U-500 insulin  A1c is 7.2%  Although his blood sugars are higher from his A1c they are still averaging about 180 at home recently He had a severe episode of hypoglycemia last month and not clear why However recently blood sugars appear to be consistently high late evening either from an adequate basal rate or inadequate boluses for dinner Again he is not bolusing consistently for all meals He can likely do better with the CGM which he is not able to get through Medicaid   Recommendations:  Start checking blood sugar at least 3 times a day including on waking up, before and after each meal and at bedtime  We will try to get his freestyle Elenor Legato again  He will start using the replacement pump that was sent  New basal settings as follows  12 AM = 0.85, 2 AM = 0.75, 8 AM = 0.95, 1 PM = 1.0 and 4 PM = 1.40  Bolus up to 10 units for larger meals or any high fat meals  Cover all meals and snacks with boluses before eating  Enter blood sugar reading in the pump every time he is eating a meal  Prescription given for G-Voke glucagon and explained that his family member should use it for severe hypoglycemia  HYPOTHYROIDISM: Previously well-controlled with 112 levothyroxine   Renal dysfunction: To follow-up with nephrologist regularly   There are no Patient Instructions on file for this visit.         Elayne Snare 06/15/2020, 4:35 PM   Note: This office note was prepared with Dragon voice recognition system technology. Any transcriptional errors that result from this process are unintentional.

## 2020-06-17 ENCOUNTER — Telehealth: Payer: Self-pay | Admitting: Endocrinology

## 2020-06-17 NOTE — Telephone Encounter (Signed)
[facility] called to advise that the Glucagon (GVOKE HYPOPEN 2-PACK) 0.5 MG/0.1ML prescribed for patient is not covered by insurance.    Can the RX be changed to [facility] and resent to pharmacy?

## 2020-06-22 ENCOUNTER — Ambulatory Visit: Payer: Medicaid Other | Admitting: Neurology

## 2020-06-22 NOTE — Telephone Encounter (Signed)
Please see below and advise.

## 2020-06-22 NOTE — Telephone Encounter (Signed)
Only if his family members know how to do the injections using a syringe.  Otherwise they will need to be instructed by Vaughan Basta or the pharmacy

## 2020-07-04 ENCOUNTER — Other Ambulatory Visit: Payer: Self-pay | Admitting: Endocrinology

## 2020-07-06 ENCOUNTER — Encounter: Payer: Self-pay | Admitting: Neurology

## 2020-07-07 ENCOUNTER — Other Ambulatory Visit: Payer: Self-pay | Admitting: *Deleted

## 2020-07-07 ENCOUNTER — Telehealth: Payer: Self-pay | Admitting: Endocrinology

## 2020-07-07 MED ORDER — HUMULIN R U-500 (CONCENTRATED) 500 UNIT/ML ~~LOC~~ SOLN: SUBCUTANEOUS | 5 refills | Status: DC

## 2020-07-07 NOTE — Telephone Encounter (Signed)
Rx was resent to increase amount of vials he can get per month

## 2020-07-07 NOTE — Telephone Encounter (Signed)
Patient requests to be called at ph# [phone] re: Patient is out of insulin regular human CONCENTRATED (HUMULIN R) 500 UNIT/ML injection-Unable to have refilled until 07/12/20. Patient states he goes through every month.

## 2020-07-08 ENCOUNTER — Telehealth: Payer: Self-pay | Admitting: Endocrinology

## 2020-07-08 NOTE — Telephone Encounter (Signed)
Pharmacy called stating she wanted to speak to nurse about an Rx that was called in - please return call to Ethan Campbell

## 2020-07-08 NOTE — Telephone Encounter (Signed)
Phone number not working.  Tried X2 Tried again.  Number not going through.

## 2020-07-08 NOTE — Telephone Encounter (Signed)
Pharmacist said she spoke with Ethan Campbell,  and she thinks his omnipod is set up the wrong,  She said he should be lasting 111 days but he's running out after only 30 days.   He told her he pulls 200 units out of the bottle but he's out way to soon, she thinks his omni-pod may be set up to deliver u-100 insulin instead of U-500.  Please advise

## 2020-07-08 NOTE — Telephone Encounter (Signed)
Linda please see if he needs to fill the pump with smaller amount of insulin since he is likely wasting some.  Please call pharmacy to clarify also.  He does not need to switch to U-100

## 2020-07-13 NOTE — Telephone Encounter (Signed)
Appointment scheduled for Wednesday ° °

## 2020-07-15 ENCOUNTER — Other Ambulatory Visit: Payer: Self-pay

## 2020-07-15 ENCOUNTER — Encounter: Payer: Medicaid Other | Attending: Endocrinology | Admitting: Nutrition

## 2020-07-15 DIAGNOSIS — E1142 Type 2 diabetes mellitus with diabetic polyneuropathy: Secondary | ICD-10-CM

## 2020-07-15 NOTE — Patient Instructions (Signed)
When removing the pod on Thursday, look into the PDM history to see how much insulin is remaining in the pod.  If more than 20u, reduce the fill amount to 125u on the syringe.

## 2020-07-15 NOTE — Progress Notes (Signed)
Per patient's PDM history, he is using approx. 40u/day, and he was filling the pod with 200u of the U-500 insulin.  Yesterday he reported that he filled the pod with only 150u.  He will see how much is remaining in his pod after it is removed.  He was shown how to find this on his PDM.  IF more than 20u, he will reduce the volume on the syringe to 125.  He was shown this on the syringe, and he reported good understanding of this.  He had no final questions.

## 2020-08-14 ENCOUNTER — Other Ambulatory Visit: Payer: Self-pay

## 2020-08-18 ENCOUNTER — Ambulatory Visit: Payer: Medicaid Other | Admitting: Endocrinology

## 2020-08-25 ENCOUNTER — Encounter: Payer: Self-pay | Admitting: Neurology

## 2020-08-25 ENCOUNTER — Other Ambulatory Visit: Payer: Self-pay

## 2020-08-25 ENCOUNTER — Ambulatory Visit (INDEPENDENT_AMBULATORY_CARE_PROVIDER_SITE_OTHER): Payer: Medicaid Other | Admitting: Neurology

## 2020-08-25 VITALS — BP 149/76 | HR 89 | Ht 69.0 in | Wt 301.0 lb

## 2020-08-25 DIAGNOSIS — G5622 Lesion of ulnar nerve, left upper limb: Secondary | ICD-10-CM | POA: Diagnosis not present

## 2020-08-25 DIAGNOSIS — E1142 Type 2 diabetes mellitus with diabetic polyneuropathy: Secondary | ICD-10-CM

## 2020-08-25 NOTE — Progress Notes (Signed)
[facility] Clinic Note - Initial Visit   Date: 08/25/20  Ethan Campbell MRN: [id number] DOB: Sep 06, 1960   Dear Arsenio Katz, NP:  Thank you for your kind referral of Ethan Campbell for consultation of left hand tingling. Although his history is well known to you, please allow [place] to reiterate it for the purpose of our medical record. The patient was accompanied to the clinic by self.    History of Present Illness: Ethan Campbell is a 60 y.o. right-handed male with diabetes complicated by neuropathy, right AKA, CKD stage 3, bone cancer (1987), hypertension, hyperlipidemia, and hypothyroidism presenting for evaluation of left hand tingling.  He underwent right AKA in November 2021 and has been using a wheelchair since this time. Several month prior to this, he has noticed numbness in the left hand, especially over the middle finger.  He has weakness with grip in the left hand and often drops objects. On the right hand, he has numbness over the tips of the fingers. He denies neck pain or radiculopathy.  He admits to resting his right elbow on the chair, not so much the left.    He has diabetic neuropathy for many years which involves numbness above the level of the knees. He was falling frequently.  His diabetes was previously uncontrolled for 10-15 years.  After seeing endocrinology, he has been better controlled.   He lives at home with girlfriend.    Out-side paper records, electronic medical record, and images have been reviewed where available and summarized as:  Lab Results  Component Value Date   HGBA1C 7.2 (A) 06/15/2020   Lab Results  Component Value Date   [id number] 950 10/29/2018   Lab Results  Component Value Date   TSH 2.70 03/09/2020   Lab Results  Component Value Date   ESRSEDRATE 130 (H) 10/22/2018    Past Medical History:  Diagnosis Date  . Arm vein blood clot, left    on Coumadin  . Arthritis   . Cancer ([place])    Bone  cancer 1987 (in knee Large cell tumor)  . Chronic kidney disease    stage 3  . Depression   . Essential hypertension   . Family history of adverse reaction to anesthesia    " my mother woke up during a cardiac surgery "  . History of stroke   . Hyperlipidemia   . Hypothyroidism   . Insomnia   . Obesity   . Precordial pain June 2011   Nuclear stress; no ischemia; EF 60%  . Presence of permanent cardiac pacemaker   . Proteinuria   . Seizure disorder ([place])   . Sick sinus syndrome ([place])   . Sleep apnea    Dr. Brandon Melnick, uses bipap  . Syncope   . Type 2 diabetes mellitus ([place])   . Venous insufficiency     Past Surgical History:  Procedure Laterality Date  . AMPUTATION Right 06/20/2018   Procedure: RIGHT GREAT TOE AMPUTATION;  Surgeon: Newt Minion, MD;  Location: [place];  Service: Orthopedics;  Laterality: Right;  . AMPUTATION Right 09/26/2018   Procedure: RIGHT FOOT 5TH RAY AMPUTATION;  Surgeon: Newt Minion, MD;  Location: [place];  Service: Orthopedics;  Laterality: Right;  MAC and regional anesthesia  . AMPUTATION Right 10/05/2018   Procedure: RIGHT BELOW KNEE AMPUTATION;  Surgeon: Newt Minion, MD;  Location: [place];  Service: Orthopedics;  Laterality: Right;  . AMPUTATION Right 10/26/2018   Procedure: RIGHT ABOVE  KNEE AMPUTATION;  Surgeon: Newt Minion, MD;  Location: [place];  Service: Orthopedics;  Laterality: Right;  . COLONOSCOPY    . RESECTION BONE TUMOR FEMUR  1980's   Left femur, treated at [facility] with bone graft  . STUMP REVISION Right 11/09/2018   Procedure: REVISION RIGHT ABOVE KNEE AMPUTATION;  Surgeon: Newt Minion, MD;  Location: [place];  Service: Orthopedics;  Laterality: Right;  . TOTAL KNEE ARTHROPLASTY Left 2013     Medications:  Outpatient Encounter Medications as of 08/25/2020  Medication Sig  . ascorbic acid (VITAMIN C) 500 MG tablet Take by mouth.  Marland Kitchen aspirin EC 81 MG EC tablet Take 1 tablet (81 mg total) by mouth 2 (two) times a day.  Marland Kitchen atorvastatin  (LIPITOR) 80 MG tablet Take 1 tablet (80 mg total) by mouth daily.  . blood glucose meter kit and supplies Dispense based on patient and insurance preference. Use up to four times daily as directed. (FOR ICD-10 E10.9, E11.9).  . carvedilol (COREG) 6.25 MG tablet Take 1 tablet (6.25 mg total) by mouth 2 (two) times daily with a meal.  . Continuous Blood Gluc Sensor (FREESTYLE LIBRE 14 DAY SENSOR) MISC 1 Units by Does not apply route every 14 (fourteen) days.  . DULoxetine (CYMBALTA) 60 MG capsule Take 1 capsule (60 mg total) by mouth daily.  . fenofibrate (TRICOR) 145 MG tablet Take 1 tablet (145 mg total) by mouth daily.  . furosemide (LASIX) 40 MG tablet Take 40 mg by mouth. Take two tablets once daily  . gabapentin (NEURONTIN) 300 MG capsule Take 300 mg by mouth 3 (three) times daily. Take 33m by mouth three times daily.  . Glucagon (GVOKE HYPOPEN 2-PACK) 0.5 MG/0.1ML SOAJ Inject SQ for severe Low sugars  . glucose blood (ACCU-CHEK AVIVA PLUS) test strip TEST UP TO 4 TIMES DAILY Dx code E11.65  . Insulin Disposable Pump (OMNIPOD 10 PACK) MISC by Does not apply route.  . Insulin Pen Needle 32G X 8 MM MISC Use as directed  . insulin regular human CONCENTRATED (HUMULIN R) 500 UNIT/ML injection USE AS DIRECTED, INJECT UP TO 35 UNITS 3 TIMES A DAY.  .Marland KitchenLancets (FREESTYLE) lancets Use as instructed  . levothyroxine (SYNTHROID) 112 MCG tablet TAKE (1) TABLET BY MOUTH ONCE DAILY BEFORE BREAKFAST.  .Marland Kitchenlisinopril (ZESTRIL) 5 MG tablet Take 5 mg by mouth daily.  . midodrine (PROAMATINE) 5 MG tablet Take 5 mg by mouth 3 (three) times daily.  . Multiple Vitamin (MULTI-VITAMINS) TABS Take by mouth.  . polyethylene glycol (MIRALAX / GLYCOLAX) packet Take 17 g by mouth daily as needed for mild constipation.  . tamsulosin (FLOMAX) 0.4 MG CAPS capsule Take by mouth.  .Marland KitchentiZANidine (ZANAFLEX) 4 MG tablet Take 4 mg by mouth 2 (two) times daily.  . Vitamin D, Ergocalciferol, (DRISDOL) 1.25 MG (50000 UNIT) CAPS  capsule Take 50,000 Units by mouth once a week.  . zinc gluconate 50 MG tablet Take by mouth.  . [DISCONTINUED] tamsulosin (FLOMAX) 0.4 MG CAPS capsule Take 0.4 mg by mouth daily.  .Marland Kitchenliraglutide (VICTOZA) 18 MG/3ML SOPN Inject 0.3 mLs (1.8 mg total) into the skin daily.  . meclizine (ANTIVERT) 25 MG tablet Take 1 tablet (25 mg total) by mouth every 8 (eight) hours as needed for dizziness. (Patient not taking: Reported on 08/25/2020)   No facility-administered encounter medications on file as of 08/25/2020.    Allergies: No Known Allergies  Family History: Family History  Problem Relation Age of Onset  .  Heart attack Mother 106  . Breast cancer Mother   . Stroke Mother   . Heart attack Father 4  . Breast cancer Sister   . Colon cancer Sister     Social History: Social History   Tobacco Use  . Smoking status: Former Smoker    Types: Cigarettes  . Smokeless tobacco: Never Used  . Tobacco comment: 11/27/14 "quit smoking years ago"  Vaping Use  . Vaping Use: Never used  Substance Use Topics  . Alcohol use: No    Alcohol/week: 0.0 standard drinks  . Drug use: No   Social History   Social History Narrative   Right Handed   Lives in a one story trailer   Drinks caffeine - drinks sugar free diet soda    Vital Signs:  BP (!) 149/76   Pulse 89   Ht _0  (1.753 m)   Wt (!) 301 lb (136.5 kg)   SpO2 97%   BMI 44.45 kg/m   Neurological Exam: MENTAL STATUS including orientation to time, place, person, recent and remote memory, attention span and concentration, language, and fund of knowledge is normal.  Speech is not dysarthric.  CRANIAL NERVES: II:  No visual field defects.  III-IV-VI: Pupils equal round and reactive to light.  Normal conjugate, extra-ocular eye movements in all directions of gaze.  No nystagmus.  No ptosis.   V:  Normal facial sensation.    VII:  Normal facial symmetry and movements.   VIII:  Normal hearing and vestibular function.   IX-X:  Normal palatal  movement.   XI:  Normal shoulder shrug and head rotation.   XII:  Normal tongue strength and range of motion, no deviation or fasciculation.  MOTOR:  Right AKA. No atrophy, fasciculations or abnormal movements.  No pronator drift.   Upper Extremity:  Right  Left  Deltoid  5/5   5/5   Biceps  5/5   5/5   Triceps  5/5   5/5   Infraspinatus 5/5  5/5  Medial pectoralis 5/5  5/5  Wrist extensors  5/5   5/5   Wrist flexors  5/5   5/5   Finger extensors  5/5   5-/5   Finger flexors  5/5   5/5   Dorsal interossei  5/5   4/5   Abductor pollicis  5/5   5/5   Tone (Ashworth scale)  0  0   Lower Extremity:  Right  Left  Hip flexors  5/5   5/5   Knee flexors    5/5   Knee extensors    5/5   Dorsiflexors    5-/5   Plantarflexors    5-/5   Tone (Ashworth scale)  0  0   MSRs:  Right        Left                  brachioradialis 2+  2+  biceps 2+  2+  triceps 2+  2+  patellar   1+  ankle jerk   0  Hoffman no  no  plantar response down  down   SENSORY:  Trace vibration at the knees, pin prick and temperature reduced at the level of the knees on the left.  Sensation in the hand is reduced to pin prick over the fingertips on the right and palm on the left.   COORDINATION/GAIT: Normal finger-to- nose-finger.  Intact rapid alternating movements bilaterally.Gait not tested, patient in wheelchair.   IMPRESSION: Left >  right hand paresthesias.  I suspect that he has progressive neuropathy with overlapping entrapment neuropathy, such as ulnar neuropathy.   - NCS/EMG of the left > right upper extremity to localize the lesion  - Avoid compression to the ulnar nerve, suggested putting soft cushion/padding on the armrest  Further recommendations pending results.    Thank you for allowing me to participate in patient's care.  If I can answer any additional questions, I would be pleased to do so.    Sincerely,    Angeles Zehner K. Posey Pronto, DO

## 2020-08-25 NOTE — Patient Instructions (Signed)
Nerve testing of the left > right arms  ELECTROMYOGRAM AND NERVE CONDUCTION STUDIES (EMG/NCS) INSTRUCTIONS  How to Prepare The neurologist conducting the EMG will need to know if you have certain medical conditions. Tell the neurologist and other EMG lab personnel if you: Have a pacemaker or any other electrical medical device Take blood-thinning medications Have hemophilia, a blood-clotting disorder that causes prolonged bleeding Bathing Take a shower or bath shortly before your exam in order to remove oils from your skin. Don't apply lotions or creams before the exam.  What to Expect You'll likely be asked to change into a hospital gown for the procedure and lie down on an examination table. The following explanations can help you understand what will happen during the exam.  Electrodes. The neurologist or a technician places surface electrodes at various locations on your skin depending on where you're experiencing symptoms. Or the neurologist may insert needle electrodes at different sites depending on your symptoms.  Sensations. The electrodes will at times transmit a tiny electrical current that you may feel as a twinge or spasm. The needle electrode may cause discomfort or pain that usually ends shortly after the needle is removed. If you are concerned about discomfort or pain, you may want to talk to the neurologist about taking a short break during the exam.  Instructions. During the needle EMG, the neurologist will assess whether there is any spontaneous electrical activity when the muscle is at rest - activity that isn't present in healthy muscle tissue - and the degree of activity when you slightly contract the muscle.  He or she will give you instructions on resting and contracting a muscle at appropriate times. Depending on what muscles and nerves the neurologist is examining, he or she may ask you to change positions during the exam.  After your EMG You may experience some  temporary, minor bruising where the needle electrode was inserted into your muscle. This bruising should fade within several days. If it persists, contact your primary care doctor.   

## 2020-09-01 ENCOUNTER — Ambulatory Visit: Payer: Medicaid Other | Admitting: Endocrinology

## 2020-10-05 ENCOUNTER — Ambulatory Visit: Payer: Medicaid Other | Admitting: Neurology

## 2020-10-07 ENCOUNTER — Other Ambulatory Visit: Payer: Self-pay

## 2020-10-07 ENCOUNTER — Ambulatory Visit: Payer: Medicaid Other | Admitting: Neurology

## 2020-10-07 DIAGNOSIS — E1142 Type 2 diabetes mellitus with diabetic polyneuropathy: Secondary | ICD-10-CM | POA: Diagnosis not present

## 2020-10-07 DIAGNOSIS — G5622 Lesion of ulnar nerve, left upper limb: Secondary | ICD-10-CM

## 2020-10-07 NOTE — Progress Notes (Signed)
Follow-up Visit   Date: 10/07/20   Ethan Campbell MRN: [id number] DOB: 07/04/1961   Interim History: Ethan Campbell is a 60 y.o. right-handed Caucasian male with diabetes complicated by neuropathy, right AKA, CKD stage 3, bone cancer (1987), hypertension, hyperlipidemia, and hypothyroidism returning to the clinic for follow-up of bilateral hand tingling.  The patient was accompanied to the clinic by self.  History of present illness: He underwent right AKA in November 2021 and has been using a wheelchair since this time. Several month prior to this, he has noticed numbness in the left hand, especially over the middle finger.  He has weakness with grip in the left hand and often drops objects. On the right hand, he has numbness over the tips of the fingers. He denies neck pain or radiculopathy.  He admits to resting his right elbow on the chair, not so much the left.    He has diabetic neuropathy for many years which involves numbness above the level of the knees. He was falling frequently.  His diabetes was previously uncontrolled for 10-15 years.  After seeing endocrinology, he has been better controlled.   He lives at home with girlfriend.    UPDATE 10/07/2020:  He is here for EDX of the hands. Although left hand is worse with respect to numbness, he does have numbness in both hands  In fact, he accidentally burnt his right hand while getting something out of the oven and didn't even feel any pain. Both hands are weak making it difficult to hold objects.  See below for results.    Medications:  Current Outpatient Medications on File Prior to Visit  Medication Sig Dispense Refill  . ascorbic acid (VITAMIN C) 500 MG tablet Take by mouth.    Marland Kitchen aspirin EC 81 MG EC tablet Take 1 tablet (81 mg total) by mouth 2 (two) times a day. 60 tablet 0  . atorvastatin (LIPITOR) 80 MG tablet Take 1 tablet (80 mg total) by mouth daily. 30 tablet 0  . blood glucose meter kit and supplies  Dispense based on patient and insurance preference. Use up to four times daily as directed. (FOR ICD-10 E10.9, E11.9). 1 each 0  . carvedilol (COREG) 6.25 MG tablet Take 1 tablet (6.25 mg total) by mouth 2 (two) times daily with a meal. 60 tablet 0  . Continuous Blood Gluc Sensor (FREESTYLE LIBRE 14 DAY SENSOR) MISC 1 Units by Does not apply route every 14 (fourteen) days. 2 each 4  . DULoxetine (CYMBALTA) 60 MG capsule Take 1 capsule (60 mg total) by mouth daily. 30 capsule 0  . fenofibrate (TRICOR) 145 MG tablet Take 1 tablet (145 mg total) by mouth daily. 30 tablet 0  . furosemide (LASIX) 40 MG tablet Take 40 mg by mouth. Take two tablets once daily    . gabapentin (NEURONTIN) 300 MG capsule Take 300 mg by mouth 3 (three) times daily. Take 383m by mouth three times daily.    . Glucagon (GVOKE HYPOPEN 2-PACK) 0.5 MG/0.1ML SOAJ Inject SQ for severe Low sugars 0.2 mL 1  . glucose blood (ACCU-CHEK AVIVA PLUS) test strip TEST UP TO 4 TIMES DAILY Dx code E11.65 200 each 3  . Insulin Disposable Pump (OMNIPOD 10 PACK) MISC by Does not apply route.    . Insulin Pen Needle 32G X 8 MM MISC Use as directed 100 each 0  . insulin regular human CONCENTRATED (HUMULIN R) 500 UNIT/ML injection USE AS DIRECTED, INJECT UP TO 35  UNITS 3 TIMES A DAY. 40 mL 5  . Lancets (FREESTYLE) lancets Use as instructed 200 each 0  . levothyroxine (SYNTHROID) 112 MCG tablet TAKE (1) TABLET BY MOUTH ONCE DAILY BEFORE BREAKFAST. 90 tablet 2  . liraglutide (VICTOZA) 18 MG/3ML SOPN Inject 0.3 mLs (1.8 mg total) into the skin daily. 9 mL 2  . lisinopril (ZESTRIL) 5 MG tablet Take 5 mg by mouth daily.    . meclizine (ANTIVERT) 25 MG tablet Take 1 tablet (25 mg total) by mouth every 8 (eight) hours as needed for dizziness. (Patient not taking: Reported on 08/25/2020) 30 tablet 0  . midodrine (PROAMATINE) 5 MG tablet Take 5 mg by mouth 3 (three) times daily.    . Multiple Vitamin (MULTI-VITAMINS) TABS Take by mouth.    . polyethylene  glycol (MIRALAX / GLYCOLAX) packet Take 17 g by mouth daily as needed for mild constipation. 30 each 0  . tamsulosin (FLOMAX) 0.4 MG CAPS capsule Take by mouth.    Marland Kitchen tiZANidine (ZANAFLEX) 4 MG tablet Take 4 mg by mouth 2 (two) times daily.    . Vitamin D, Ergocalciferol, (DRISDOL) 1.25 MG (50000 UNIT) CAPS capsule Take 50,000 Units by mouth once a week.    . zinc gluconate 50 MG tablet Take by mouth.     No current facility-administered medications on file prior to visit.    Allergies: No Known Allergies  Vital Signs:  There were no vitals taken for this visit.   Exam deferred  Data: NCS/EMG of the arms 10/07/2020: The electrophysiologic findings are consistent with a severe and chronic sensorimotor polyneuropathy, with demyelinating and axonal features, affecting bilateral upper extremities.  Lab Results  Component Value Date   HGBA1C 7.2 (A) 06/15/2020    IMPRESSION/PLAN: Bilateral hand weakness and paresthesias due to progressive and severe sensorimotor polyneuropathy involving the upper extremities.  Results discussed with patient.  Management is focused on optimizing diabetes and preventing complications such as injury.  Occupational therapy declined.  I offered suggestions for hand assist devices which can be helpful for day-to-day activities.   Return to clinic as needed  Thank you for allowing me to participate in patient's care.  If I can answer any additional questions, I would be pleased to do so.    Sincerely,    Sinead Hockman K. Posey Pronto, DO

## 2020-10-07 NOTE — Procedures (Signed)
[facility]  [address]  [place], [place] [address] Tel: [phone] Fax:  [phone] Test Date:  10/07/2020  Patient: Ethan Campbell DOB: 13-Sep-1960 Physician: Narda Amber, DO  Sex: Male Height: 5\' 9"  Ref Phys: Narda Amber, DO  ID#: [id number]   Technician:    Patient Complaints: This is a 60 year old man with diabetes mellitus referred for evaluation of bilateral hand numbness, tingling, and weakness.  NCV & EMG Findings: Extensive electrodiagnostic testing of the left upper extremity and additional studies of the right shows:  1. All sensory responses including bilateral median, ulnar, and radial nerves are absent. 2. All motor responses including bilateral median and ulnar motor responses show prolonged latencies, markedly reduced amplitude, and slowed conduction velocity along the course of the nerves. 3. Severe chronic motor axonal loss changes are seen affecting bilateral intrinsic hand muscles and conform to a gradient pattern in the forearm.  Impression: The electrophysiologic findings are consistent with a severe and chronic sensorimotor polyneuropathy, with demyelinating and axonal features, affecting bilateral upper extremities.   ___________________________ Narda Amber, DO    Nerve Conduction Studies Anti Sensory Summary Table   Stim Site NR Peak (ms) Norm Peak (ms) P-T Amp (V) Norm P-T Amp  Left Median Anti Sensory (2nd Digit)  32C  Wrist NR  <3.6  >15  Right Median Anti Sensory (2nd Digit)  32C  Wrist NR  <3.6  >15  Left Radial Anti Sensory (Base 1st Digit)  32C  Wrist NR  <2.7  >14  Right Radial Anti Sensory (Base 1st Digit)  32C  Wrist NR  <2.7  >14  Left Ulnar Anti Sensory (5th Digit)  32C  Wrist NR  <3.1  >10  Right Ulnar Anti Sensory (5th Digit)  32C  Wrist NR  <3.1  >10   Motor Summary Table   Stim Site NR Onset (ms) Norm Onset (ms) O-P Amp (mV) Norm O-P Amp Site1 Site2 Delta-0 (ms) Dist (cm) Vel (m/s) Norm  Vel (m/s)  Left Median Motor (Abd Poll Brev)  32C  Wrist    11.1 <4.0 1.8 >6 Elbow Wrist 6.0 27.0 45 >50  Elbow    17.1  1.5         Right Median Motor (Abd Poll Brev)  32C  Wrist    10.4 <4.0 3.5 >6 Elbow Wrist 6.0 28.0 47 >50  Elbow    16.4  3.1         Left Ulnar Motor (Abd Dig Minimi)  32C  Wrist    4.5 <3.1 1.0 >7 B Elbow Wrist  23.0  >50  B Elbow NR     A Elbow B Elbow  10.0  >50  A Elbow NR            Right Ulnar Motor (Abd Dig Minimi)  32C  Wrist    3.2 <3.1 1.8 >7 B Elbow Wrist 5.3 24.0 45 >50  B Elbow    8.4  1.3  A Elbow B Elbow 2.8 10.0 36 >50  A Elbow    11.2  1.1          EMG   Side Muscle Ins Act Fibs Psw Fasc Number Recrt Dur Dur. Amp Amp. Poly Poly. Comment  Left 1stDorInt Nml Nml Nml Nml SMU Rapid All 1+ All 1+ All 1+ ATR  Left Abd Poll Brev Nml Nml Nml Nml SMU Rapid All 1+ All 1+ All 1+ N/A  Left Ext Indicis Nml Nml Nml Nml 1-  Rapid Some 1+ Some 1+ Nml Nml N/A  Left ABD Dig Min Nml Nml Nml Nml 3- Rapid All 1+ All 1+ All 1+ ATR  Left FlexDigProf 4,5 Nml Nml Nml Nml 1- Rapid Some 1+ Some 1+ Some 1+ N/A  Left PronatorTeres Nml Nml Nml Nml 1- Rapid Some 1+ Some 1+ Some 1+ N/A  Left Biceps Nml Nml Nml Nml Nml Nml Nml Nml Nml Nml Nml Nml N/A  Left Triceps Nml Nml Nml Nml Nml Nml Nml Nml Nml Nml Nml Nml N/A  Left Deltoid Nml Nml Nml Nml Nml Nml Nml Nml Nml Nml Nml Nml N/A  Right 1stDorInt Nml Nml Nml Nml 3- Rapid All 1+ All 1+ All 1+ ATR  Right Abd Poll Brev Nml Nml Nml Nml 3- Rapid All 1+ All 1+ All 1+ N/A  Right Ext Indicis Nml Nml Nml Nml 1- Rapid Some 1+ Some 1+ Nml Nml N/A  Right ABD Dig Min Nml Nml Nml Nml 3- Rapid All 1+ All 1+ All 1+ ATR  Right FlexDigProf 4,5 Nml Nml Nml Nml 1- Rapid Some 1+ Some 1+ Some 1+ N/A  Right PronatorTeres Nml Nml Nml Nml 1- Rapid Some 1+ Some 1+ Some 1+ N/A  Right Biceps Nml Nml Nml Nml Nml Nml Nml Nml Nml Nml Nml Nml N/A  Right Triceps Nml Nml Nml Nml Nml Nml Nml Nml Nml Nml Nml Nml N/A  Right Deltoid Nml Nml Nml Nml Nml Nml Nml  Nml Nml Nml Nml Nml N/A      Waveforms:

## 2020-12-16 ENCOUNTER — Telehealth: Payer: Self-pay | Admitting: Endocrinology

## 2020-12-16 NOTE — Telephone Encounter (Signed)
Please have patient come in at 9 AM in the morning on 9/26

## 2020-12-16 NOTE — Telephone Encounter (Signed)
Correction to my previous note: Please have patient come in today, 5/26 at 9 AM

## 2020-12-16 NOTE — Telephone Encounter (Signed)
[facility]

## 2020-12-16 NOTE — Telephone Encounter (Signed)
Pt's sugar dropped to 37 and became unresponsive. He was given his emergent dose of glucagon and was still unresponsive so gf called emergency services. Pt was given dextrose solution by EMS and was able to decline a trip to the ED. Pt has been monitoring his sugars and his sugars have gone up to 97 as of 4:30. Pt's girlfriend called to get pt scheduled for a follow up appt as this has happened 5 to 6 times recently and is worried it may happen again.

## 2020-12-16 NOTE — Telephone Encounter (Signed)
Pt's girlfriend calling in stating that she has had to call the EMS  A couple times for blood sugar being low .This morning at 11:30am it was  37. At 2:10 it is 85. Pt and girlfriend need a call as soon as possible.

## 2020-12-17 NOTE — Telephone Encounter (Signed)
Spoken to patient and notified Dr Ronnie Derby comments. Verbalized understanding. Patient's wife will call back to schedule the appointment

## 2020-12-17 NOTE — Telephone Encounter (Signed)
Please schedule him for a specific time tomorrow to be seen as acute. Clinical team: Please have him reduce his basal rate on his pump down to 0.3 round-the-clock until he is seen tomorrow

## 2020-12-17 NOTE — Telephone Encounter (Signed)
Patient returned call - refused appointment for 12/18/2020 and any other appointments offered.  He was scheduled for 12/28/2020 @ 11:00 AM

## 2020-12-28 ENCOUNTER — Ambulatory Visit: Payer: Medicaid Other | Admitting: Endocrinology

## 2020-12-28 ENCOUNTER — Other Ambulatory Visit: Payer: Self-pay

## 2020-12-28 ENCOUNTER — Encounter: Payer: Self-pay | Admitting: Endocrinology

## 2020-12-28 VITALS — BP 130/82 | HR 69

## 2020-12-28 DIAGNOSIS — E782 Mixed hyperlipidemia: Secondary | ICD-10-CM | POA: Diagnosis not present

## 2020-12-28 DIAGNOSIS — IMO0002 Reserved for concepts with insufficient information to code with codable children: Secondary | ICD-10-CM

## 2020-12-28 DIAGNOSIS — E160 Drug-induced hypoglycemia without coma: Secondary | ICD-10-CM | POA: Diagnosis not present

## 2020-12-28 DIAGNOSIS — E1165 Type 2 diabetes mellitus with hyperglycemia: Secondary | ICD-10-CM

## 2020-12-28 DIAGNOSIS — E039 Hypothyroidism, unspecified: Secondary | ICD-10-CM

## 2020-12-28 DIAGNOSIS — E1142 Type 2 diabetes mellitus with diabetic polyneuropathy: Secondary | ICD-10-CM

## 2020-12-28 DIAGNOSIS — T383X5A Adverse effect of insulin and oral hypoglycemic [antidiabetic] drugs, initial encounter: Secondary | ICD-10-CM

## 2020-12-28 LAB — TSH: TSH: 9.49 u[IU]/mL — ABNORMAL HIGH (ref 0.35–4.50)

## 2020-12-28 LAB — POCT GLYCOSYLATED HEMOGLOBIN (HGB A1C): Hemoglobin A1C: 7.9 % — AB (ref 4.0–5.6)

## 2020-12-28 MED ORDER — FREESTYLE LIBRE 14 DAY SENSOR MISC: 1.0000 [IU] | 4 refills | Status: DC

## 2020-12-28 MED ORDER — GVOKE HYPOPEN 2-PACK 0.5 MG/0.1ML ~~LOC~~ SOAJ: SUBCUTANEOUS | 1 refills | Status: DC

## 2020-12-28 NOTE — Patient Instructions (Addendum)
12 AM = 0.45, 2 AM = 0.35, 8 AM = 0.65,  and 4 PM = 0.90  Bolus only for 40-50 carbs  Get Covid shots!

## 2020-12-28 NOTE — Progress Notes (Signed)
Patient ID: Ethan Campbell, male   DOB: 1961-07-01, 60 y.o.   MRN: [id number]   Reason for Appointment: follow-up   History of Present Illness   Diagnosis: Type 2 DIABETES MELITUS, date of diagnosis: 2000    Previous history: he has been on insulin for several years with consistently poor control Has been requiring large doses of insulin for his diabetes but A1c has been persistently high Blood sugars did not improve significantly even with trying Byetta and Victoza In 2014 he was switched from NovoLog to U-500 insulin but not clear if he has had improvement in control except with fasting readings His prior A1c was 14.0 in 5/14 and he had educational discussions with diabetes educator and dietitian in 6/14   He was started on the OMNIPOD pump on 02/06/2018  Recent history:  Non-insulin hypoglycemic drugs: Victoza 1.8 mg daily Insulin: Humulin R U-500   Omnipod PUMP settings:   Basal rate 0.3 all 24 hours  Previous settings:12 AM = 0.85, 2 AM = 0.75, 8 AM = 0.95, 1 PM = 1.0 and 4 PM = 1.40  Preset boluses: 4-7 units with IC ratio 10 Blood sugar target 150 between 12 AM-7 AM and then 130 Sensitivity 1: 50, active insulin 6 hours  His A1c is 7.9 compared to 7.2   Current management, blood sugar patterns and problems identified:  He has not been seen in follow-up since 11/21  About a month ago he started getting frequent low blood sugar episodes and was told to reduce his basal rate about 2 weeks ago and he is now finally coming in for follow-up  He says his wife is able to give him the glucagon injection but ran out of refills  He is checking his blood sugars somewhat sporadically with his Accu-Chek meter  His blood sugars are now usually well over 200 and up to about 400  Only when he takes a relatively larger bolus for his meal in the evening he will have a lower sugar the next morning, recently 118 in the morning  Has had only 1 episode of hypoglycemia  around 2-3 AM with a blood sugar of 45 possibly from a light meal the night before  He says he is eating 3 meals a day but generally bolus frequency is about twice a day or less and only is recently bolusing when blood sugars are significantly higher for fear of hypoglycemia  Breakfast may be only crackers or toast  Not clear what his recent renal function is, last was checked in December   Mealtimes: 8 am 12 pm, 6 pm  Proper timing of medications in relation to meals: Yes, usually 30 minutes before eating .          Monitors blood glucose:  2-  4 times a day.    Glucometer: Accu-Chek  Blood sugar download results as above:   PRE-MEAL Fasting Lunch Dinner Bedtime Overall  Glucose range: 75-271  134-243 ?   Mean/median:  140   195   182   POST-MEAL PC Breakfast PC Lunch PC Dinner  Glucose range:   213-348  Mean/median:    230      Usually eating 2 meals a day with blood sugars improving after his first meal but not to normal Glucose data incomplete after evening meal but likely somewhat better with pre-meal blood sugar around 280-290   Physical activity: exercise:none   Last dietitian visit: 6/18 and last CDE visit in 4/16  Wt  Readings from Last 3 Encounters:  08/25/20 (!) 301 lb (136.5 kg)  12/18/18 283 lb (128.4 kg)  12/06/18 283 lb (128.4 kg)    LABS:   Lab Results  Component Value Date   HGBA1C 7.2 (A) 06/15/2020   HGBA1C 8.0 (H) 03/10/2020   HGBA1C 10.2 (A) 10/29/2019   Lab Results  Component Value Date   MICROALBUR 16.7 (H) 08/19/2019   LDLCALC 61 04/22/2019   CREATININE 1.68 (H) 08/19/2019    Lab Results  Component Value Date   FRUCTOSAMINE 280 03/09/2020   FRUCTOSAMINE 341 (H) 08/19/2019   FRUCTOSAMINE 333 (H) 04/22/2019    OTHER active problems are discussed in review of systems   Allergies as of 12/28/2020   No Known Allergies     Medication List       Accurate as of December 28, 2020 11:48 AM. If you have any questions, ask your nurse or  doctor.        ascorbic acid 500 MG tablet Commonly known as: VITAMIN C Take by mouth.   aspirin 81 MG EC tablet Take 1 tablet (81 mg total) by mouth 2 (two) times a day.   atorvastatin 80 MG tablet Commonly known as: LIPITOR Take 1 tablet (80 mg total) by mouth daily.   blood glucose meter kit and supplies Dispense based on patient and insurance preference. Use up to four times daily as directed. (FOR ICD-10 E10.9, E11.9).   carvedilol 6.25 MG tablet Commonly known as: COREG Take 1 tablet (6.25 mg total) by mouth 2 (two) times daily with a meal.   DULoxetine 60 MG capsule Commonly known as: CYMBALTA Take 1 capsule (60 mg total) by mouth daily.   fenofibrate 145 MG tablet Commonly known as: TRICOR Take 1 tablet (145 mg total) by mouth daily.   freestyle lancets Use as instructed   FreeStyle Libre 14 Day Sensor Misc 1 Units by Does not apply route every 14 (fourteen) days.   furosemide 40 MG tablet Commonly known as: LASIX Take 40 mg by mouth. Take two tablets once daily   gabapentin 300 MG capsule Commonly known as: NEURONTIN Take 300 mg by mouth 3 (three) times daily. Take 374m by mouth three times daily.   glucose blood test strip Commonly known as: Accu-Chek Aviva Plus TEST UP TO 4 TIMES DAILY Dx code E11.65   Gvoke HypoPen 2-Pack 0.5 MG/0.1ML Soaj Generic drug: Glucagon Inject SQ for severe Low sugars   HUMULIN R 500 UNIT/ML injection Generic drug: insulin regular human CONCENTRATED USE AS DIRECTED, INJECT UP TO 35 UNITS 3 TIMES A DAY.   Insulin Pen Needle 32G X 8 MM Misc Use as directed   levothyroxine 112 MCG tablet Commonly known as: SYNTHROID TAKE (1) TABLET BY MOUTH ONCE DAILY BEFORE BREAKFAST.   lisinopril 5 MG tablet Commonly known as: ZESTRIL Take 5 mg by mouth daily.   meclizine 25 MG tablet Commonly known as: ANTIVERT Take 1 tablet (25 mg total) by mouth every 8 (eight) hours as needed for dizziness.   midodrine 5 MG  tablet Commonly known as: PROAMATINE Take 5 mg by mouth 3 (three) times daily.   Multi-Vitamins Tabs Take by mouth.   OmniPod 10 Pack Misc by Does not apply route.   polyethylene glycol 17 g packet Commonly known as: MIRALAX / GLYCOLAX Take 17 g by mouth daily as needed for mild constipation.   tamsulosin 0.4 MG Caps capsule Commonly known as: FLOMAX Take by mouth.   tiZANidine 4 MG tablet Commonly  known as: ZANAFLEX Take 4 mg by mouth 2 (two) times daily.   Victoza 18 MG/3ML Sopn Generic drug: liraglutide Inject 0.3 mLs (1.8 mg total) into the skin daily.   Vitamin D (Ergocalciferol) 1.25 MG (50000 UNIT) Caps capsule Commonly known as: DRISDOL Take 50,000 Units by mouth once a week.   zinc gluconate 50 MG tablet Take by mouth.       Allergies: No Known Allergies  Past Medical History:  Diagnosis Date  . Arm vein blood clot, left    on Coumadin  . Arthritis   . Cancer ([place])    Bone cancer 1987 (in knee Large cell tumor)  . Chronic kidney disease    stage 3  . Depression   . Essential hypertension   . Family history of adverse reaction to anesthesia    " my mother woke up during a cardiac surgery "  . History of stroke   . Hyperlipidemia   . Hypothyroidism   . Insomnia   . Obesity   . Precordial pain June 2011   Nuclear stress; no ischemia; EF 60%  . Presence of permanent cardiac pacemaker   . Proteinuria   . Seizure disorder ([place])   . Sick sinus syndrome ([place])   . Sleep apnea    Dr. Brandon Melnick, uses bipap  . Syncope   . Type 2 diabetes mellitus ([place])   . Venous insufficiency     Past Surgical History:  Procedure Laterality Date  . AMPUTATION Right 06/20/2018   Procedure: RIGHT GREAT TOE AMPUTATION;  Surgeon: Newt Minion, MD;  Location: [facility];  Service: Orthopedics;  Laterality: Right;  . AMPUTATION Right 09/26/2018   Procedure: RIGHT FOOT 5TH RAY AMPUTATION;  Surgeon: Newt Minion, MD;  Location: [facility];  Service: Orthopedics;  Laterality:  Right;  MAC and regional anesthesia  . AMPUTATION Right 10/05/2018   Procedure: RIGHT BELOW KNEE AMPUTATION;  Surgeon: Newt Minion, MD;  Location: [facility];  Service: Orthopedics;  Laterality: Right;  . AMPUTATION Right 10/26/2018   Procedure: RIGHT ABOVE KNEE AMPUTATION;  Surgeon: Newt Minion, MD;  Location: [facility];  Service: Orthopedics;  Laterality: Right;  . COLONOSCOPY    . RESECTION BONE TUMOR FEMUR  1980's   Left femur, treated at [facility] with bone graft  . STUMP REVISION Right 11/09/2018   Procedure: REVISION RIGHT ABOVE KNEE AMPUTATION;  Surgeon: Newt Minion, MD;  Location: [facility];  Service: Orthopedics;  Laterality: Right;  . TOTAL KNEE ARTHROPLASTY Left 2013    Family History  Problem Relation Age of Onset  . Heart attack Mother 51  . Breast cancer Mother   . Stroke Mother   . Heart attack Father 51  . Breast cancer Sister   . Colon cancer Sister     Social History:  reports that he has quit smoking. His smoking use included cigarettes. He has never used smokeless tobacco. He reports that he does not drink alcohol and does not use drugs.  Review of Systems:    HYPOTHYROIDISM:   on  levothyroxine 112 ug daily The dose was increased in 1/21 Has been taking the levothyroxine before breakfast daily No recent labs available  Lab Results  Component Value Date   TSH 2.70 03/09/2020   TSH 6.66 (H) 08/19/2019   TSH 3.89 06/10/2019   FREET4 0.95 03/09/2020   FREET4 0.95 08/19/2019   FREET4 0.93 06/10/2019     Lipids: triglycerides on labs on the last measurement are below 150  LDL has been controlled consistently He is on atorvastatin 80 mg,  Followed by PCP   Lab Results  Component Value Date   CHOL 114 04/22/2019   HDL 37.70 (L) 04/22/2019   LDLCALC 61 04/22/2019   LDLDIRECT 80.0 03/07/2016   TRIG 76.0 04/22/2019   CHOLHDL 3 04/22/2019      CKD: His creatinine has been variable, last seen by nephrologist in December 21   Lab Results  Component Value  Date   CREATININE 1.68 (H) 08/19/2019   History of CHF, taking Lasix using 40 mg tablet  He has not had the Covid vaccine and also not had a previous infection, still refuses to take this    Examination:   BP 130/82   Pulse 69   SpO2 98%   There is no height or weight on file to calculate BMI.   Recent weight about 300  ASSESSMENT/ PLAN:   Diabetes type 2 with obesity, insulin-dependent  See history of present illness for detailed discussion of current diabetes management, blood sugar patterns and problems identified  He has been on OMNIPOD insulin pump with U-500 insulin  A1c is 7.9%  Is coming back after several months Although his blood sugars were doing reasonably well he apparently had severe hypoglycemia multiple times in the last month Basal rate has been reduced sharply down to 0.3 only and this is causing hyperglycemia with recent blood sugars mostly over 300 except when he is eating a light meal in the evening and taking correction boluses Still not able to get the CGM because of Medicaid issues   Recommendations:  Prescription for CGM will be faxed to the appropriate pharmacy for Medicaid  In the meantime for his Accu-Chek he will need to check his blood sugars 4 times a day  He needs to start cutting back on his boluses for meals to prevent low sugars especially overnight  Make sure he has protein at breakfast daily  More regular follow-up  He will adjust his basal rates as below  He will need to record blood sugar reading in the pump every time he is eating a meal to enable correction boluses New prescription sent for G-Voke glucagon  New basal rates: 12 AM = 0.45, 2 AM = 0.35, 8 AM = 0.65,  and 4 PM = 0.90  Bolus only for 40-50 carbs  Emphasized the need for him to get the COVID-vaccine and patient information handout given  HYPOTHYROIDISM: Previously well-controlled with 112 levothyroxine and needs follow-up today   Renal dysfunction: To  follow-up with nephrologist and also check renal function today   Patient Instructions  12 AM = 0.45, 2 AM = 0.35, 8 AM = 0.65,  and 4 PM = 0.90  Bolus only for 40-50 carbs  Get Covid shots!          Elayne Snare 12/28/2020, 11:48 AM   Note: This office note was prepared with Dragon voice recognition system technology. Any transcriptional errors that result from this process are unintentional.

## 2020-12-29 LAB — COMPREHENSIVE METABOLIC PANEL
ALT: 33 U/L (ref 0–53)
AST: 24 U/L (ref 0–37)
Albumin: 3.5 g/dL (ref 3.5–5.2)
Alkaline Phosphatase: 111 U/L (ref 39–117)
BUN: 30 mg/dL — ABNORMAL HIGH (ref 6–23)
CO2: 26 mEq/L (ref 19–32)
Calcium: 9.8 mg/dL (ref 8.4–10.5)
Chloride: 103 mEq/L (ref 96–112)
Creatinine, Ser: 1.75 mg/dL — ABNORMAL HIGH (ref 0.40–1.50)
GFR: 42.02 mL/min — ABNORMAL LOW (ref >60.00)
Glucose, Bld: 245 mg/dL — ABNORMAL HIGH (ref 70–99)
Potassium: 4.4 mEq/L (ref 3.5–5.1)
Sodium: 138 mEq/L (ref 135–145)
Total Bilirubin: 0.5 mg/dL (ref 0.2–1.2)
Total Protein: 7 g/dL (ref 6.0–8.3)

## 2020-12-29 LAB — LIPID PANEL
Cholesterol: 210 mg/dL — ABNORMAL HIGH (ref 0–200)
HDL: 36.5 mg/dL — ABNORMAL LOW (ref >39.00)
LDL Cholesterol: 148 mg/dL — ABNORMAL HIGH (ref 0–99)
NonHDL: 173.12
Total CHOL/HDL Ratio: 6
Triglycerides: 124 mg/dL (ref 0.0–149.0)
VLDL: 24.8 mg/dL (ref 0.0–40.0)

## 2020-12-30 ENCOUNTER — Telehealth: Payer: Self-pay

## 2020-12-30 DIAGNOSIS — E039 Hypothyroidism, unspecified: Secondary | ICD-10-CM

## 2020-12-30 MED ORDER — LEVOTHYROXINE SODIUM 137 MCG PO TABS
137.0000 ug | ORAL_TABLET | Freq: Every day | ORAL | 1 refills | Status: DC
Start: 2020-12-30 — End: 2021-04-16

## 2020-12-30 NOTE — Progress Notes (Signed)
Since cholesterol is high with taking atorvastatin 80 mg he will need to start ezetimibe 10 mg also, please send prescription

## 2020-12-30 NOTE — Progress Notes (Signed)
Please call: His thyroid level is low and he will need to increase his levothyroxine from 112 up to 137 mcg daily assuming he is taking this regularly.  Also appears that he is not taking his atorvastatin 80 mg as cholesterol is high.  Needs to start back on this

## 2020-12-30 NOTE — Telephone Encounter (Addendum)
Called and advised pt of results. ----- Message from Elayne Snare, MD sent at 12/30/2020 10:57 AM EDT ----- Please call: His thyroid level is low and he will need to increase his levothyroxine from 112 up to 137 mcg daily assuming he is taking this regularly.  Also appears that he is not taking his atorvastatin 80 mg as cholesterol is high.  Needs to start back on this

## 2020-12-31 MED ORDER — EZETIMIBE 10 MG PO TABS: 10.0000 mg | ORAL_TABLET | Freq: Every day | ORAL | 0 refills | Status: DC

## 2020-12-31 NOTE — Addendum Note (Signed)
Addended by: Lauralyn Primes on: 12/31/2020 11:33 AM   Modules accepted: Orders

## 2021-01-05 ENCOUNTER — Telehealth: Payer: Self-pay | Admitting: Pharmacy Technician

## 2021-01-05 NOTE — Telephone Encounter (Addendum)
Patient Advocate Encounter   Received notification from [facility] that prior authorization for GVOKE PFS is required.   PA submitted on 01/05/2021 Key [id number] Status is DENIED.  Plan will cover [place] and provider sent to pharmacy.     Clinic will continue to follow.   Venida Jarvis. Nadara Mustard, CPhT Patient Advocate [facility] Phone: [phone] Fax:  [phone]

## 2021-01-12 ENCOUNTER — Other Ambulatory Visit: Payer: Self-pay | Admitting: Endocrinology

## 2021-01-12 MED ORDER — ZEGALOGUE 0.6 MG/0.6ML ~~LOC~~ SOAJ: 0.6000 mL | SUBCUTANEOUS | 2 refills | Status: DC | PRN

## 2021-02-08 ENCOUNTER — Encounter: Payer: Self-pay | Admitting: Endocrinology

## 2021-02-08 ENCOUNTER — Ambulatory Visit (INDEPENDENT_AMBULATORY_CARE_PROVIDER_SITE_OTHER): Payer: Medicaid Other | Admitting: Endocrinology

## 2021-02-08 ENCOUNTER — Other Ambulatory Visit: Payer: Self-pay

## 2021-02-08 VITALS — BP 130/80 | HR 62 | Ht 69.0 in | Wt 336.2 lb

## 2021-02-08 DIAGNOSIS — E1165 Type 2 diabetes mellitus with hyperglycemia: Secondary | ICD-10-CM

## 2021-02-08 DIAGNOSIS — Z794 Long term (current) use of insulin: Secondary | ICD-10-CM

## 2021-02-08 DIAGNOSIS — E063 Autoimmune thyroiditis: Secondary | ICD-10-CM

## 2021-02-08 DIAGNOSIS — Z89511 Acquired absence of right leg below knee: Secondary | ICD-10-CM

## 2021-02-08 MED ORDER — FREESTYLE LIBRE 14 DAY SENSOR MISC: 1.0000 [IU] | 4 refills | Status: DC

## 2021-02-08 NOTE — Patient Instructions (Addendum)
Check sugar 4x daily  New basal rates: 12 AM = 0.45, 2 AM = 0.35, 8 AM = 0.80,  and 4 PM = 1.20  HAVE LABS DONE FOR TSH AND LIPID

## 2021-02-08 NOTE — Progress Notes (Signed)
Patient ID: Ethan Campbell, male   DOB: [birth year], 60 y.o.   MRN: [id number]   Reason for Appointment: follow-up   History of Present Illness   Diagnosis: Type 2 DIABETES MELITUS, date of diagnosis: 2000    Previous history: he has been on insulin for several years with consistently poor control Has been requiring large doses of insulin for his diabetes but A1c has been persistently high Blood sugars did not improve significantly even with trying Byetta and Victoza In 2014 he was switched from NovoLog to U-500 insulin but not clear if he has had improvement in control except with fasting readings His prior A1c was 14.0 in 5/14 and he had educational discussions with diabetes educator and dietitian in 6/14   He was started on the OMNIPOD pump on 02/06/2018  Recent history:  Non-insulin hypoglycemic drugs: Victoza 1.8 mg daily Insulin: Humulin R U-500   Omnipod PUMP settings:   Basal rate settings 12 AM = 0.85, 2 AM = 0.75, 8 AM = 0.95, 1 PM = 1.0 and 4 PM = 1.40  Preset boluses: 4-7 units with IC ratio 10 Blood sugar target 150 between 12 AM-7 AM and then 130 Sensitivity 1: 50, active insulin 6 hours  His A1c is last 7.9 compared to 7.2   Current management, blood sugar patterns and problems identified:  Since he had tendency to low sugars prior to his last visit his basal rate was reduced significantly  He has started having mostly high readings now with overall average 223 but did not call to report this Has not had any low blood sugars. Has had only 1 significant low blood sugar of 47 on 6/25 Previously had severe hypoglycemia He is checking his blood sugars regularly again with his Accu-Chek meter He did not apparently get his freestyle libre and he thinks it is not covered His blood sugars are now usually well over 200 except sometimes in the mornings  He has gone up on boluses to the same amounts as before Also did change his basal rates although he  thinks he has some difficulty doing this However difficult to judge his postprandial readings with minimal monitoring after meals When he checks his blood sugars at bedtime his blood sugars are markedly increased usually As before his mealtimes are usually regular Generally may not eat his first meal until midday He does take his Victoza regularly   Mealtimes: 8 am 12 pm, 6 pm  Proper timing of medications in relation to meals: Yes, usually 30 minutes before eating .          Monitors blood glucose:  2-  4 times a day.    Glucometer: Accu-Chek  Blood sugar download results for the last 2 weeks  PRE-MEAL Fasting Lunch Dinner Bedtime Overall  Glucose range: 65-360   359-376 65-376  Mean/median: ?  180 219 243  241   Previously:  PRE-MEAL Fasting Lunch Dinner Bedtime Overall  Glucose range: 75-271  134-243 ?   Mean/median:  140   195   182   POST-MEAL PC Breakfast PC Lunch PC Dinner  Glucose range:   213-348  Mean/median:    230      Usually eating 2 meals a day with blood sugars improving after his first meal but not to normal Glucose data incomplete after evening meal but likely somewhat better with pre-meal blood sugar around 280-290   Physical activity: exercise:none   Last dietitian visit: 6/18 and last CDE visit in  4/16  Wt Readings from Last 3 Encounters:  02/08/21 (!) 336 lb 3.2 oz (152.5 kg)  08/25/20 (!) 301 lb (136.5 kg)  12/18/18 283 lb (128.4 kg)    LABS:   Lab Results  Component Value Date   HGBA1C 7.9 (A) 12/28/2020   HGBA1C 7.2 (A) 06/15/2020   HGBA1C 8.0 (H) 03/10/2020   Lab Results  Component Value Date   MICROALBUR 16.7 (H) 08/19/2019   LDLCALC 148 (H) 12/28/2020   CREATININE 1.75 (H) 12/28/2020    Lab Results  Component Value Date   FRUCTOSAMINE 280 03/09/2020   FRUCTOSAMINE 341 (H) 08/19/2019   FRUCTOSAMINE 333 (H) 04/22/2019    OTHER active problems are discussed in review of systems   Allergies as of 02/08/2021   No Known  Allergies      Medication List        Accurate as of February 08, 2021 11:59 PM. If you have any questions, ask your nurse or doctor.          STOP taking these medications    ascorbic acid 500 MG tablet Commonly known as: VITAMIN C Stopped by: Elayne Snare, MD       TAKE these medications    aspirin 81 MG EC tablet Take 1 tablet (81 mg total) by mouth 2 (two) times a day.   atorvastatin 80 MG tablet Commonly known as: LIPITOR Take 1 tablet (80 mg total) by mouth daily.   blood glucose meter kit and supplies Dispense based on patient and insurance preference. Use up to four times daily as directed. (FOR ICD-10 E10.9, E11.9).   carvedilol 6.25 MG tablet Commonly known as: COREG Take 1 tablet (6.25 mg total) by mouth 2 (two) times daily with a meal.   DULoxetine 60 MG capsule Commonly known as: CYMBALTA Take 1 capsule (60 mg total) by mouth daily.   ezetimibe 10 MG tablet Commonly known as: ZETIA Take 1 tablet (10 mg total) by mouth daily.   fenofibrate 145 MG tablet Commonly known as: TRICOR Take 1 tablet (145 mg total) by mouth daily.   freestyle lancets Use as instructed   FreeStyle Libre 14 Day Sensor Misc 1 Units by Does not apply route every 14 (fourteen) days.   furosemide 40 MG tablet Commonly known as: LASIX Take 40 mg by mouth. Take two tablets once daily   gabapentin 300 MG capsule Commonly known as: NEURONTIN Take 300 mg by mouth 3 (three) times daily. Take 366m by mouth three times daily.   glucose blood test strip Commonly known as: Accu-Chek Aviva Plus TEST UP TO 4 TIMES DAILY Dx code E11.65   HUMULIN R 500 UNIT/ML injection Generic drug: insulin regular human CONCENTRATED USE AS DIRECTED, INJECT UP TO 35 UNITS 3 TIMES A DAY.   Insulin Pen Needle 32G X 8 MM Misc Use as directed   levothyroxine 137 MCG tablet Commonly known as: SYNTHROID Take 1 tablet (137 mcg total) by mouth daily before breakfast.   lisinopril 5 MG  tablet Commonly known as: ZESTRIL Take 5 mg by mouth daily.   meclizine 25 MG tablet Commonly known as: ANTIVERT Take 1 tablet (25 mg total) by mouth every 8 (eight) hours as needed for dizziness.   midodrine 5 MG tablet Commonly known as: PROAMATINE Take 5 mg by mouth 3 (three) times daily.   Multi-Vitamins Tabs Take by mouth.   OmniPod 10 Pack Misc by Does not apply route.   polyethylene glycol 17 g packet Commonly known as: MIRALAX /  GLYCOLAX Take 17 g by mouth daily as needed for mild constipation.   tamsulosin 0.4 MG Caps capsule Commonly known as: FLOMAX Take by mouth.   tiZANidine 4 MG tablet Commonly known as: ZANAFLEX Take 4 mg by mouth 2 (two) times daily.   Victoza 18 MG/3ML Sopn Generic drug: liraglutide Inject 0.3 mLs (1.8 mg total) into the skin daily.   Vitamin D (Ergocalciferol) 1.25 MG (50000 UNIT) Caps capsule Commonly known as: DRISDOL Take 50,000 Units by mouth once a week.   [place] 0.6 MG/0.6ML Soaj Generic drug: Dasiglucagon HCl Inject 0.6 mLs into the skin as needed. To be injected when unable to treat low blood sugars by mouth   zinc gluconate 50 MG tablet Take by mouth.        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 Brandon Melnick, uses bipap   Syncope    Type 2 diabetes mellitus ([place])    Venous insufficiency     Past  Surgical History:  Procedure Laterality Date   AMPUTATION Right 06/20/2018   Procedure: RIGHT GREAT TOE AMPUTATION;  Surgeon: Newt Minion, MD;  Location: [facility];  Service: Orthopedics;  Laterality: Right;   AMPUTATION Right 09/26/2018   Procedure: RIGHT FOOT 5TH RAY AMPUTATION;  Surgeon: Newt Minion, MD;  Location: [place];  Service: Orthopedics;  Laterality: Right;  MAC and regional anesthesia   AMPUTATION Right 10/05/2018   Procedure: RIGHT BELOW KNEE AMPUTATION;  Surgeon: Newt Minion, MD;  Location: [facility];  Service: Orthopedics;  Laterality: Right;   AMPUTATION Right 10/26/2018   Procedure: RIGHT ABOVE KNEE AMPUTATION;  Surgeon: Newt Minion, MD;  Location: [place];  Service: Orthopedics;  Laterality: Right;   COLONOSCOPY     RESECTION BONE TUMOR FEMUR  1980's   Left femur, treated at [facility] with bone graft   STUMP REVISION Right 11/09/2018   Procedure: REVISION RIGHT ABOVE KNEE AMPUTATION;  Surgeon: Newt Minion, MD;  Location: [place];  Service: Orthopedics;  Laterality: Right;   TOTAL KNEE ARTHROPLASTY Left 2013    Family History  Problem Relation Age of Onset   Heart attack Mother 1   Breast cancer Mother    Stroke Mother    Heart attack Father 32   Breast cancer Sister    Colon cancer Sister     Social History:  reports that he has quit smoking. His smoking use included cigarettes. He has never used smokeless tobacco. He reports that he does not drink alcohol and does not use drugs.  Review of Systems:    HYPOTHYROIDISM:   on  levothyroxine 137 ug daily The dose was increased in 12/2020 Has  been taking the levothyroxine before breakfast daily and previously had not been skipping any doses  No follow-up labs available  Lab Results  Component Value Date   TSH 9.49 (H) 12/28/2020   TSH 2.70 03/09/2020   TSH 6.66 (H) 08/19/2019   FREET4 0.95 03/09/2020   FREET4 0.95 08/19/2019   FREET4 0.93 06/10/2019     Lipids: triglycerides on labs on the last measurement are  below 150 LDL has been controlled consistently He is on atorvastatin 80 mg, he thinks he is taking this regularly Because of his high LDL ezetimibe was added in 6/22  Followed by PCP   Lab Results  Component Value Date   CHOL 210 (H) 12/28/2020   HDL 36.50 (L) 12/28/2020   LDLCALC 148 (H) 12/28/2020   LDLDIRECT 80.0 03/07/2016   TRIG 124.0 12/28/2020   CHOLHDL 6 12/28/2020      CKD: His creatinine has been variable, seen regularly by nephrologist  Lab Results  Component Value Date   CREATININE 1.75 (H) 12/28/2020   CREATININE 1.68 (H) 08/19/2019   CREATININE 2.05 (H) 06/10/2019    History of CHF, taking Lasix using 40 mg tablet      Examination:   BP 130/80   Pulse 62   Ht _0  (1.753 m)   Wt (!) 336 lb 3.2 oz (152.5 kg)   SpO2 95%   BMI 49.65 kg/m   Body mass index is 49.65 kg/m.   Recent weight about 300  ASSESSMENT/ PLAN:   Diabetes type 2 with obesity, insulin-dependent  See history of present illness for detailed discussion of current diabetes management, blood sugar patterns and problems identified  He has been on OMNIPOD insulin pump with U-500 insulin  A1c is 7.9%   Although his blood sugars were doing reasonably well he apparently had severe hypoglycemia multiple times in the last month Basal rate has been reduced sharply down to 0.3 only and this is causing hyperglycemia with recent blood sugars mostly over 300 except when he is eating a light meal in the evening and taking correction boluses Still not able to get the CGM because of Medicaid coverage   Recommendations: Prescription for Freestyle libre will be faxed to the mail order pharmacy for Medicaid In the meantime with Accu-Chek he will need to check his blood sugars 4 times a day especially a couple of hours after meals He needs to start going up on his boluses for his evening meal especially for higher fat meals Also may take correction boluses at bedtime Make sure he has protein at  breakfast daily Balanced meals and low-fat intake He will adjust his basal rates as below He will need to try and bolus 30 minutes before eating consistently  New basal rates:   12 AM = 0.45, 2 AM = 0.35, 8 AM = 0.80,  and 4 PM = 1.20   HYPOTHYROIDISM: Previously TSH high and now needs follow-up labs, advised him to have this checked from his PCP He has an appointment tomorrow   Renal dysfunction: To follow-up with nephrologist and also check renal function with PCP   Patient Instructions  Check sugar 4x daily  New basal rates: 12 AM = 0.45, 2 AM = 0.35, 8 AM = 0.80,  and 4 PM = 1.20  HAVE LABS DONE FOR TSH AND LIPID         Elayne Snare 02/10/2021, 9:54 AM   Note: This office note was prepared with Dragon voice recognition system technology. Any transcriptional  errors that result from this process are unintentional.

## 2021-02-10 LAB — LIPID PANEL
Cholesterol: 146 (ref 0–200)
HDL: 38 (ref 35–70)
LDL Cholesterol: 90
Triglycerides: 93 (ref 40–160)

## 2021-02-10 LAB — TSH: TSH: 3.3 (ref 0.41–5.90)

## 2021-02-10 LAB — BASIC METABOLIC PANEL: Creatinine: 2 — AB (ref <=1.3)

## 2021-02-22 ENCOUNTER — Ambulatory Visit: Payer: Medicaid Other | Admitting: Endocrinology

## 2021-03-05 ENCOUNTER — Encounter: Payer: Self-pay | Admitting: Endocrinology

## 2021-03-07 IMAGING — DX RIGHT FEMUR PORTABLE 2 VIEW
4 series · 4 of 4 positions shown · non-contrast
Comparison: None.

CLINICAL DATA: Right leg pain

EXAM:
RIGHT FEMUR PORTABLE 2 VIEW

[femur ap (1 of 2)]
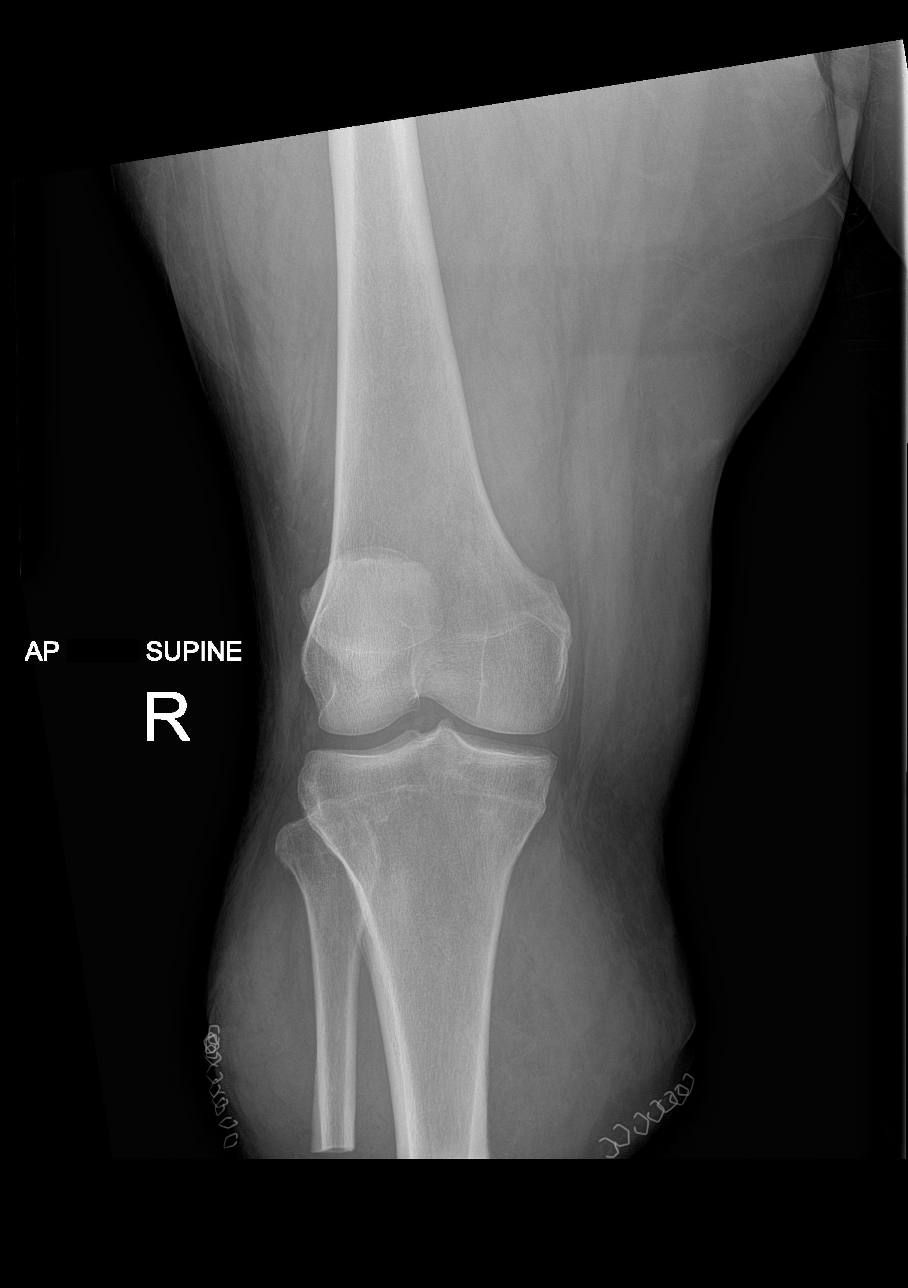

[femur ap (2 of 2)]
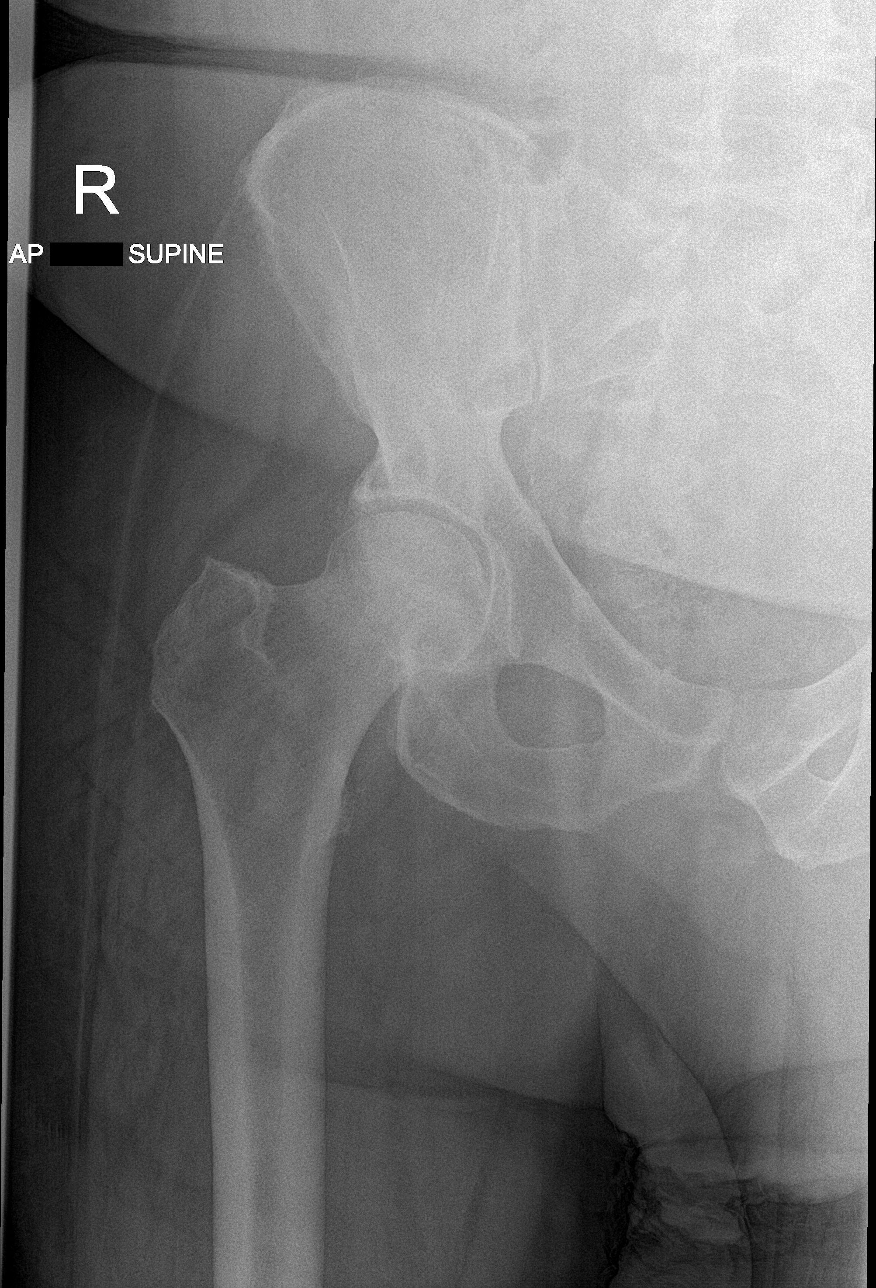

[femur lat (1 of 2)]
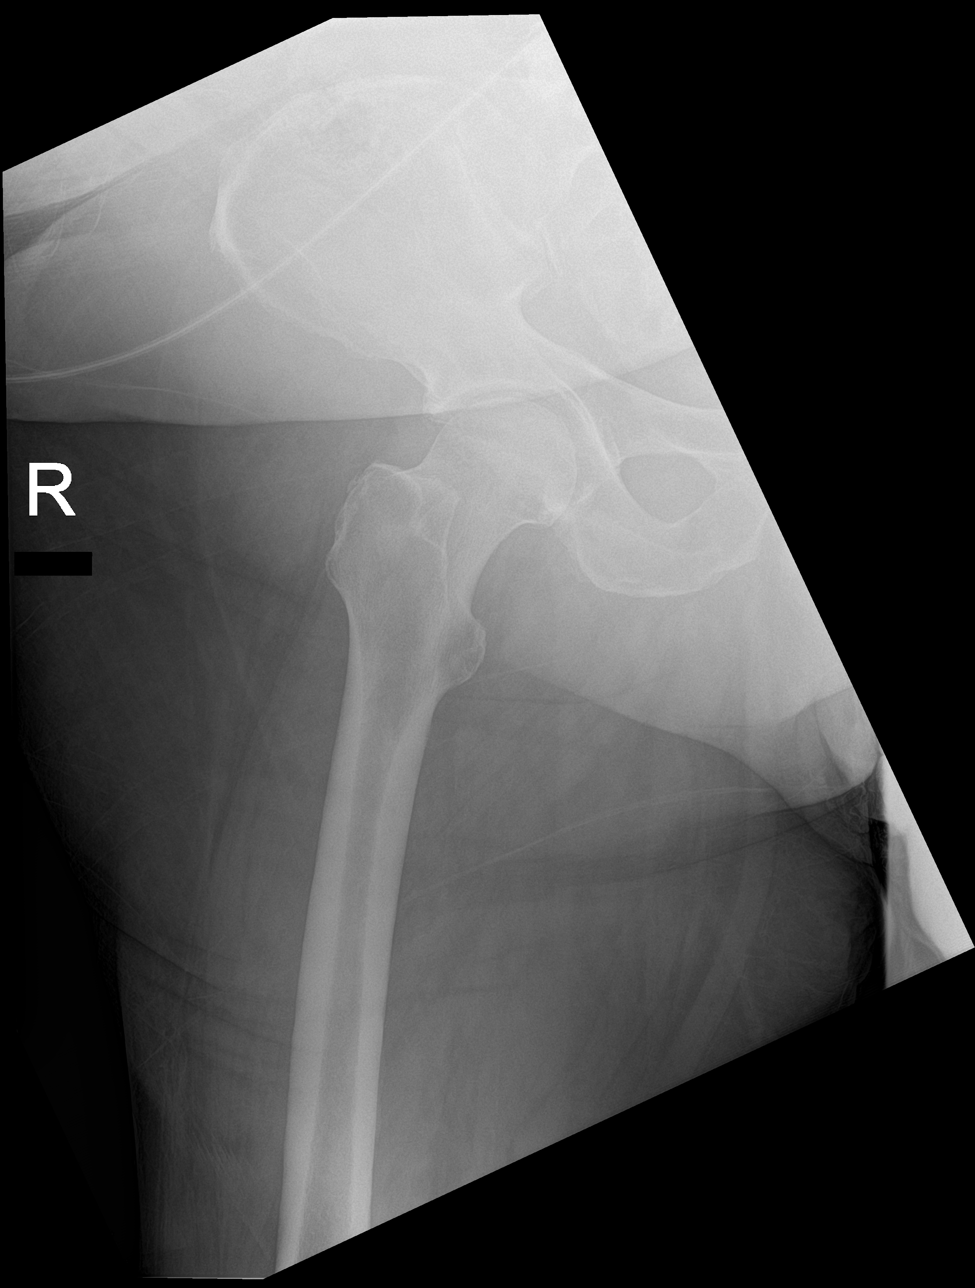

[femur lat (2 of 2)]
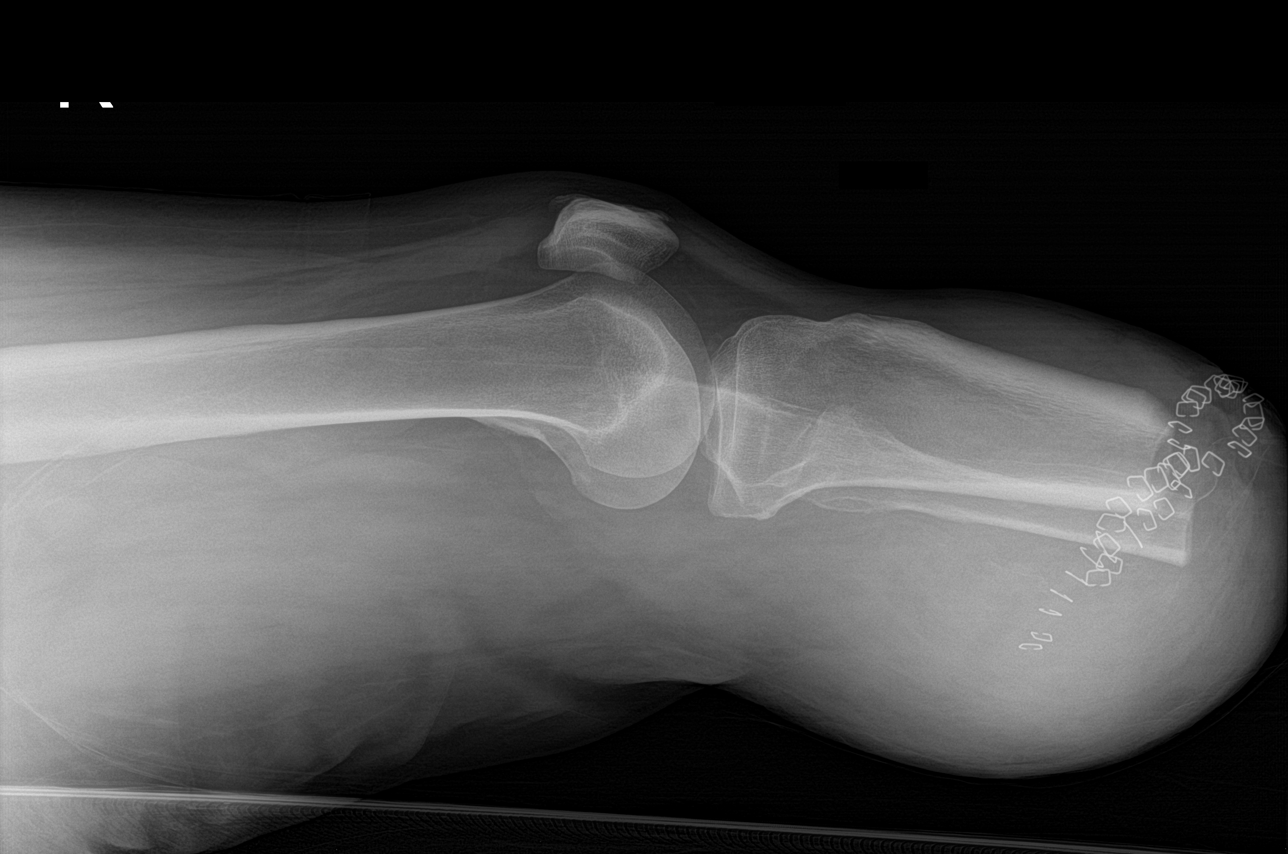

[4 of 4 positions shown; findings below may reference images not displayed]

FINDINGS: There is no evidence of fracture or other focal bone lesions. Soft
tissues are unremarkable. Right BKA with skin staples. Expected
postoperative appearance.
IMPRESSION: Expected postoperative appearance of right BKA.  No acute findings.

## 2021-03-22 ENCOUNTER — Other Ambulatory Visit: Payer: Self-pay | Admitting: Endocrinology

## 2021-03-22 ENCOUNTER — Encounter: Payer: Self-pay | Admitting: Endocrinology

## 2021-03-22 ENCOUNTER — Ambulatory Visit (INDEPENDENT_AMBULATORY_CARE_PROVIDER_SITE_OTHER): Payer: Medicaid Other | Admitting: Endocrinology

## 2021-03-22 ENCOUNTER — Other Ambulatory Visit: Payer: Self-pay

## 2021-03-22 VITALS — BP 152/92 | HR 82 | Ht 69.0 in | Wt 332.0 lb

## 2021-03-22 DIAGNOSIS — E1165 Type 2 diabetes mellitus with hyperglycemia: Secondary | ICD-10-CM

## 2021-03-22 DIAGNOSIS — Z794 Long term (current) use of insulin: Secondary | ICD-10-CM

## 2021-03-22 MED ORDER — DEXCOM G6 TRANSMITTER MISC: 1.0000 | Freq: Once | 1 refills | Status: AC

## 2021-03-22 MED ORDER — DEXCOM G6 SENSOR MISC: 3 refills | Status: DC

## 2021-03-22 MED ORDER — DEXCOM G6 RECEIVER DEVI: 0 refills | Status: DC

## 2021-03-22 NOTE — Progress Notes (Signed)
Patient ID: Ethan Campbell, male   DOB: 02-03-1961, 60 y.o.   MRN: [id number]   Reason for Appointment: follow-up   History of Present Illness   Diagnosis: Type 2 DIABETES MELITUS, date of diagnosis: 2000    Previous history: he has been on insulin for several years with consistently poor control Has been requiring large doses of insulin for his diabetes but A1c has been persistently high Blood sugars did not improve significantly even with trying Byetta and Victoza In 2014 he was switched from NovoLog to U-500 insulin but not clear if he has had improvement in control except with fasting readings His prior A1c was 14.0 in 5/14 and he had educational discussions with diabetes educator and dietitian in 6/14   He was started on the OMNIPOD pump on 02/06/2018  Recent history:  Non-insulin hypoglycemic drugs: Victoza 1.8 mg daily Insulin: Humulin R U-500   Omnipod PUMP settings:   Basal rate settings 12 AM = 0.45, 2 AM = 0.35, 8 AM = 0.80,  and 4 PM = 1.20  Preset boluses: 4-7 units with IC ratio 10 Blood sugar target 150 between 12 AM-7 AM and then 130 Sensitivity 1: 50, active insulin 6 hours  His A1c is last 7.9 compared to 7.2  Current management, blood sugar patterns and problems identified:  He still was not able to get the freestyle libre despite sending it to the appropriate pharmacy  His meter could not be downloaded and did not give an indication of whether his blood sugars are before breakfast or lunch or dinner  He again has variable mealtimes  Currently appears to have normal or high readings at any given time of the day  He thinks he is BOLUSING for every meal or large snack with carbohydrate up to 7 units  However not clear if he is actually putting in his blood sugar to do a correction bolus at the same time  His pump could not be downloaded because of low battery  He was told to check his blood sugars up to 4 times a day but he still forgets to do so  and does not have a smart phone to remind him Has not had any low blood sugars recently.  He does take his Victoza regularly   Mealtimes: 8 am 12 pm, 6 pm  Proper timing of medications in relation to meals: Yes, usually 30 minutes before eating .          Monitors blood glucose:  2-  4 times a day.    Glucometer: Accu-Chek  Blood sugar download results for the last 2 weeks   PRE-MEAL AC breakfast Lunch Dinner Bedtime Overall  Glucose range: 86-417 128-347 85-458 457   Mean/median:     ?   Previously:  PRE-MEAL Fasting Lunch Dinner Bedtime Overall  Glucose range: 65-360   359-376 65-376  Mean/median: ?  180 219 243  241   Previously:  PRE-MEAL Fasting Lunch Dinner Bedtime Overall  Glucose range: 75-271  134-243 ?   Mean/median:  140   195   182   POST-MEAL PC Breakfast PC Lunch PC Dinner  Glucose range:   213-348  Mean/median:    230      Usually eating 2 meals a day with blood sugars improving after his first meal but not to normal Glucose data incomplete after evening meal but likely somewhat better with pre-meal blood sugar around 280-290   Physical activity: exercise:none   Last dietitian visit:  6/18 and last CDE visit in 4/16  Wt Readings from Last 3 Encounters:  03/22/21 (!) 332 lb (150.6 kg)  02/08/21 (!) 336 lb 3.2 oz (152.5 kg)  08/25/20 (!) 301 lb (136.5 kg)    LABS:   Lab Results  Component Value Date   HGBA1C 7.9 (A) 12/28/2020   HGBA1C 7.2 (A) 06/15/2020   HGBA1C 8.0 (H) 03/10/2020   Lab Results  Component Value Date   MICROALBUR 16.7 (H) 08/19/2019   LDLCALC 90 02/10/2021   CREATININE 2.0 (A) 02/10/2021    Lab Results  Component Value Date   FRUCTOSAMINE 280 03/09/2020   FRUCTOSAMINE 341 (H) 08/19/2019   FRUCTOSAMINE 333 (H) 04/22/2019    OTHER active problems are discussed in review of systems   Allergies as of 03/22/2021   No Known Allergies      Medication List        Accurate as of March 22, 2021  1:19 PM. If you  have any questions, ask your nurse or doctor.          aspirin 81 MG EC tablet Take 1 tablet (81 mg total) by mouth 2 (two) times a day.   atorvastatin 80 MG tablet Commonly known as: LIPITOR Take 1 tablet (80 mg total) by mouth daily.   blood glucose meter kit and supplies Dispense based on patient and insurance preference. Use up to four times daily as directed. (FOR ICD-10 E10.9, E11.9).   carvedilol 6.25 MG tablet Commonly known as: COREG Take 1 tablet (6.25 mg total) by mouth 2 (two) times daily with a meal.   DULoxetine 60 MG capsule Commonly known as: CYMBALTA Take 1 capsule (60 mg total) by mouth daily.   ezetimibe 10 MG tablet Commonly known as: ZETIA Take 1 tablet (10 mg total) by mouth daily.   fenofibrate 145 MG tablet Commonly known as: TRICOR Take 1 tablet (145 mg total) by mouth daily.   freestyle lancets Use as instructed   FreeStyle Libre 14 Day Sensor Misc 1 Units by Does not apply route every 14 (fourteen) days.   furosemide 40 MG tablet Commonly known as: LASIX Take 40 mg by mouth. Take two tablets once daily   gabapentin 300 MG capsule Commonly known as: NEURONTIN Take 300 mg by mouth 3 (three) times daily. Take 356m by mouth three times daily.   glucose blood test strip Commonly known as: Accu-Chek Aviva Plus TEST UP TO 4 TIMES DAILY Dx code E11.65   HUMULIN R 500 UNIT/ML injection Generic drug: insulin regular human CONCENTRATED USE AS DIRECTED, INJECT UP TO 35 UNITS 3 TIMES A DAY.   Insulin Pen Needle 32G X 8 MM Misc Use as directed   levothyroxine 137 MCG tablet Commonly known as: SYNTHROID Take 1 tablet (137 mcg total) by mouth daily before breakfast.   lisinopril 5 MG tablet Commonly known as: ZESTRIL Take 5 mg by mouth daily.   meclizine 25 MG tablet Commonly known as: ANTIVERT Take 1 tablet (25 mg total) by mouth every 8 (eight) hours as needed for dizziness.   midodrine 5 MG tablet Commonly known as: PROAMATINE Take  5 mg by mouth 3 (three) times daily.   Multi-Vitamins Tabs Take by mouth.   OmniPod 10 Pack Misc by Does not apply route.   polyethylene glycol 17 g packet Commonly known as: MIRALAX / GLYCOLAX Take 17 g by mouth daily as needed for mild constipation.   tamsulosin 0.4 MG Caps capsule Commonly known as: FLOMAX Take by mouth.  tiZANidine 4 MG tablet Commonly known as: ZANAFLEX Take 4 mg by mouth 2 (two) times daily.   Victoza 18 MG/3ML Sopn Generic drug: liraglutide Inject 0.3 mLs (1.8 mg total) into the skin daily.   Vitamin D (Ergocalciferol) 1.25 MG (50000 UNIT) Caps capsule Commonly known as: DRISDOL Take 50,000 Units by mouth once a week.   [place] 0.6 MG/0.6ML Soaj Generic drug: Dasiglucagon HCl Inject 0.6 mLs into the skin as needed. To be injected when unable to treat low blood sugars by mouth   zinc gluconate 50 MG tablet Take by mouth.        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 Brandon Melnick, uses bipap   Syncope    Type 2 diabetes mellitus ([place])    Venous insufficiency     Past Surgical History:  Procedure Laterality Date   AMPUTATION Right 06/20/2018   Procedure: RIGHT GREAT TOE AMPUTATION;  Surgeon: Newt Minion, MD;  Location: [facility];  Service: Orthopedics;  Laterality: Right;   AMPUTATION Right 09/26/2018   Procedure: RIGHT FOOT 5TH  RAY AMPUTATION;  Surgeon: Newt Minion, MD;  Location: [facility];  Service: Orthopedics;  Laterality: Right;  MAC and regional anesthesia   AMPUTATION Right 10/05/2018   Procedure: RIGHT BELOW KNEE AMPUTATION;  Surgeon: Newt Minion, MD;  Location: [facility];  Service: Orthopedics;  Laterality: Right;   AMPUTATION Right 10/26/2018   Procedure: RIGHT ABOVE KNEE AMPUTATION;  Surgeon: Newt Minion, MD;  Location: [facility];  Service: Orthopedics;  Laterality: Right;   COLONOSCOPY     RESECTION BONE TUMOR FEMUR  1980's   Left femur, treated at [facility] with bone graft   STUMP REVISION Right 11/09/2018   Procedure: REVISION RIGHT ABOVE KNEE AMPUTATION;  Surgeon: Newt Minion, MD;  Location: [facility];  Service: Orthopedics;  Laterality: Right;   TOTAL KNEE ARTHROPLASTY Left 2013    Family History  Problem Relation Age of Onset   Heart attack Mother 22   Breast cancer Mother    Stroke Mother    Heart attack Father 31   Breast cancer Sister    Colon cancer Sister     Social History:  reports that he has quit smoking. His smoking use included cigarettes. He has never used smokeless tobacco. He reports that he does not drink alcohol and does not use drugs.  Review of Systems:    HYPOTHYROIDISM:   on  levothyroxine 137 ug daily The dose was increased in 12/2020 Has been taking the levothyroxine before breakfast daily Labs as follows   Lab Results  Component Value Date   TSH 3.30 02/10/2021   TSH 9.49 (H)  12/28/2020   TSH 2.70 03/09/2020   FREET4 0.95 03/09/2020   FREET4 0.95 08/19/2019   FREET4 0.93 06/10/2019     Lipids: triglycerides on labs on the last measurement are below 150 LDL has been controlled consistently He is on atorvastatin 80 mg, he thinks he is taking this regularly Because of his high LDL ezetimibe was added in 6/22  Followed by PCP   Lab Results  Component Value Date   CHOL 146 02/10/2021   HDL 38 02/10/2021   LDLCALC 90 02/10/2021   LDLDIRECT 80.0 03/07/2016    TRIG 93 02/10/2021   CHOLHDL 6 12/28/2020      CKD: His creatinine has been variable, seen regularly by nephrologist  Lab Results  Component Value Date   CREATININE 2.0 (A) 02/10/2021   CREATININE 1.75 (H) 12/28/2020   CREATININE 1.68 (H) 08/19/2019    History of CHF, taking Lasix using 40 mg tablet      Examination:   Ht _0  (1.753 m)   Wt (!) 332 lb (150.6 kg)   BMI 49.03 kg/m   Body mass index is 49.03 kg/m.     ASSESSMENT/ PLAN:   Diabetes type 2 with obesity, insulin-dependent  See history of present illness for detailed discussion of current diabetes management, blood sugar patterns and problems identified  He has been on OMNIPOD insulin pump with U-500 insulin along with Victoza  A1c is last 7.9%  Blood sugars are difficult to assess because his meter could not be downloaded and review of his meter indicates highly variable blood sugars at all different times Usually not checking before and after combination of blood sugars to help assess his boluses His pump had a low battery and could not be downloaded   Recommendations: Prescription for Dexcom will be sent to mail order pharmacy since apparently his Elenor Legato is being denied by Medicaid No change in settings as yet  He will make sure he put in the blood sugar at the time of his mealtime bolus and put in the mealtime bolus based on what he is eating   In the meantime with Accu-Chek he will need to check his blood sugars 4 times a day and he will get his friend to make sure he is reminded  Follow-up in 2 months   There are no Patient Instructions on file for this visit.         Elayne Snare 03/22/2021, 1:19 PM   Note: This office note was prepared with Dragon voice recognition system technology. Any transcriptional errors that result from this process are unintentional.

## 2021-03-22 NOTE — Patient Instructions (Signed)
Check sugar 4x daily

## 2021-03-23 ENCOUNTER — Encounter (INDEPENDENT_AMBULATORY_CARE_PROVIDER_SITE_OTHER): Payer: Medicaid Other | Admitting: Ophthalmology

## 2021-03-23 ENCOUNTER — Ambulatory Visit (INDEPENDENT_AMBULATORY_CARE_PROVIDER_SITE_OTHER): Payer: Medicaid Other | Admitting: Ophthalmology

## 2021-03-23 ENCOUNTER — Encounter (INDEPENDENT_AMBULATORY_CARE_PROVIDER_SITE_OTHER): Payer: Self-pay | Admitting: Ophthalmology

## 2021-03-23 DIAGNOSIS — H2513 Age-related nuclear cataract, bilateral: Secondary | ICD-10-CM | POA: Diagnosis not present

## 2021-03-23 DIAGNOSIS — E113591 Type 2 diabetes mellitus with proliferative diabetic retinopathy without macular edema, right eye: Secondary | ICD-10-CM

## 2021-03-23 DIAGNOSIS — E113592 Type 2 diabetes mellitus with proliferative diabetic retinopathy without macular edema, left eye: Secondary | ICD-10-CM | POA: Diagnosis not present

## 2021-03-23 DIAGNOSIS — H4312 Vitreous hemorrhage, left eye: Secondary | ICD-10-CM | POA: Diagnosis not present

## 2021-03-23 NOTE — Assessment & Plan Note (Signed)
Quiescent PDR clear media

## 2021-03-23 NOTE — Assessment & Plan Note (Signed)
Minor in the past now cleared

## 2021-03-23 NOTE — Assessment & Plan Note (Signed)
Cataract(s) account for the patient's complaint. I discussed the risks and benefits of cataract surgery. Options were explained to the patient. The patient understands that new glasses may not improve their vision and desires to have cataract surgery. I have recommended follow up with their general eye care doctor for evaluation and consideration of cataract extraction with new intraocular lens insertion.  Will refer for cataract extraction with intraocular lens placement, Dr. Harrell Gave or [facility] , [facility]

## 2021-03-23 NOTE — Assessment & Plan Note (Signed)
Quiescent PDR good PRP

## 2021-03-23 NOTE — Progress Notes (Signed)
03/23/2021     CHIEF COMPLAINT Patient presents for  Chief Complaint  Patient presents with   Retina Follow Up      HISTORY OF PRESENT ILLNESS: Ethan Campbell is a 60 y.o. male who presents to the clinic today for:   HPI     Retina Follow Up   Patient presents with  Diabetic Retinopathy.  In left eye.  This started 1.5 years ago.  Severity is mild.  Duration of 1.5 years.  Since onset it is stable.        Comments   1 yr 4 months fu ou fp Pt states new onset of floaters as of one month ago, states "I would eat my regular meals but my machine wasn't working, it was telling me 500-600 on the meter and the doctor couldn't figure out why. They got it figured out, I got a new meter, but since then I started having floaters in my left eye. There's 15-20 floaters, kind of blocking my vision there is one big one that comes down the top of my eye. I also have seen flashes of light off to the side, about 3-5 times a day especially when I am watching t.v."  LBS: 128 this morning. A1C: 7.2       Last edited by Laurin Coder, COA on 03/23/2021  3:21 PM.      Referring physician: Arsenio Katz, NP [place],  [place] [id number]  HISTORICAL INFORMATION:   Selected notes from the MEDICAL RECORD NUMBER    Lab Results  Component Value Date   HGBA1C 7.9 (A) 12/28/2020     CURRENT MEDICATIONS: No current outpatient medications on file. (Ophthalmic Drugs)   No current facility-administered medications for this visit. (Ophthalmic Drugs)   Current Outpatient Medications (Other)  Medication Sig   aspirin EC 81 MG EC tablet Take 1 tablet (81 mg total) by mouth 2 (two) times a day.   atorvastatin (LIPITOR) 80 MG tablet Take 1 tablet (80 mg total) by mouth daily.   blood glucose meter kit and supplies Dispense based on patient and insurance preference. Use up to four times daily as directed. (FOR ICD-10 E10.9, E11.9).   carvedilol (COREG) 6.25 MG tablet Take 1 tablet (6.25  mg total) by mouth 2 (two) times daily with a meal.   Continuous Blood Gluc Receiver (DEXCOM G6 RECEIVER) DEVI Use with Dexcom sensor/transmitter   Continuous Blood Gluc Sensor (DEXCOM G6 SENSOR) MISC Use to monitor blood sugar, change after 10 days   Dasiglucagon HCl (ZEGALOGUE) 0.6 MG/0.6ML SOAJ Inject 0.6 mLs into the skin as needed. To be injected when unable to treat low blood sugars by mouth   DULoxetine (CYMBALTA) 60 MG capsule Take 1 capsule (60 mg total) by mouth daily.   ezetimibe (ZETIA) 10 MG tablet Take 1 tablet (10 mg total) by mouth daily.   fenofibrate (TRICOR) 145 MG tablet Take 1 tablet (145 mg total) by mouth daily.   furosemide (LASIX) 40 MG tablet Take 40 mg by mouth. Take two tablets once daily   gabapentin (NEURONTIN) 300 MG capsule Take 300 mg by mouth 3 (three) times daily. Take 363m by mouth three times daily.   glucose blood (ACCU-CHEK AVIVA PLUS) test strip TEST UP TO 4 TIMES DAILY Dx code E11.65   Insulin Disposable Pump (OMNIPOD 10 PACK) MISC by Does not apply route.   Insulin Pen Needle 32G X 8 MM MISC Use as directed   insulin regular human CONCENTRATED (  HUMULIN R) 500 UNIT/ML injection USE AS DIRECTED, INJECT UP TO 35 UNITS 3 TIMES A DAY.   Lancets (FREESTYLE) lancets Use as instructed   levothyroxine (SYNTHROID) 137 MCG tablet Take 1 tablet (137 mcg total) by mouth daily before breakfast.   liraglutide (VICTOZA) 18 MG/3ML SOPN Inject 0.3 mLs (1.8 mg total) into the skin daily.   lisinopril (ZESTRIL) 5 MG tablet Take 5 mg by mouth daily.   meclizine (ANTIVERT) 25 MG tablet Take 1 tablet (25 mg total) by mouth every 8 (eight) hours as needed for dizziness.   midodrine (PROAMATINE) 5 MG tablet Take 5 mg by mouth 3 (three) times daily.   Multiple Vitamin (MULTI-VITAMINS) TABS Take by mouth.   polyethylene glycol (MIRALAX / GLYCOLAX) packet Take 17 g by mouth daily as needed for mild constipation.   tamsulosin (FLOMAX) 0.4 MG CAPS capsule Take by mouth.    tiZANidine (ZANAFLEX) 4 MG tablet Take 4 mg by mouth 2 (two) times daily.   Vitamin D, Ergocalciferol, (DRISDOL) 1.25 MG (50000 UNIT) CAPS capsule Take 50,000 Units by mouth once a week.   zinc gluconate 50 MG tablet Take by mouth.   No current facility-administered medications for this visit. 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 Brandon Melnick, uses bipap   Syncope    Type 2 diabetes mellitus ([place])    Venous insufficiency    Past Surgical History:  Procedure Laterality Date   AMPUTATION Right 06/20/2018   Procedure: RIGHT GREAT TOE AMPUTATION;  Surgeon: Newt Minion, MD;  Location: [facility];  Service: Orthopedics;  Laterality: Right;   AMPUTATION Right 09/26/2018   Procedure: RIGHT FOOT 5TH RAY AMPUTATION;  Surgeon: Newt Minion, MD;  Location: [facility];  Service: Orthopedics;  Laterality: Right;  MAC and regional anesthesia   AMPUTATION Right 10/05/2018   Procedure: RIGHT BELOW KNEE AMPUTATION;  Surgeon: Newt Minion, MD;  Location: [facility];  Service: Orthopedics;  Laterality: Right;   AMPUTATION Right 10/26/2018   Procedure: RIGHT ABOVE KNEE AMPUTATION;  Surgeon: Newt Minion, MD;  Location: [facility];  Service: Orthopedics;  Laterality: Right;   COLONOSCOPY     RESECTION BONE TUMOR FEMUR  1980's   Left femur, treated at  [facility] with bone graft   STUMP REVISION Right 11/09/2018   Procedure: REVISION RIGHT ABOVE KNEE AMPUTATION;  Surgeon: Newt Minion, MD;  Location: [facility];  Service: Orthopedics;  Laterality: Right;   TOTAL KNEE ARTHROPLASTY Left 2013    FAMILY HISTORY Family History  Problem Relation Age of Onset   Heart attack Mother  59   Breast cancer Mother    Stroke Mother    Heart attack Father 52   Breast cancer Sister    Colon cancer Sister     SOCIAL HISTORY Social History   Tobacco Use   Smoking status: Former    Types: Cigarettes   Smokeless tobacco: Never   Tobacco comments:    11/27/14 "quit smoking years ago"  Vaping Use   Vaping Use: Never used  Substance Use Topics   Alcohol use: No    Alcohol/week: 0.0 standard drinks   Drug use: No         OPHTHALMIC EXAM:  Base Eye Exam     Visual Acuity (Snellen - Linear)       Right Left   Dist [place] 20/30 20/100   Dist ph [place] NI 20/40 -2         Tonometry (Tonopen, 3:11 PM)       Right Left   Pressure 12 6         Pupils       Pupils Dark Light APD   Right PERRL 5 4 None   Left PERRL 5 4 None         Extraocular Movement       Right Left    Full Full         Neuro/Psych     Oriented x3: Yes   Mood/Affect: Normal         Dilation     Both eyes: 1.0% Mydriacyl, 2.5% Phenylephrine @ 3:11 PM           Slit Lamp and Fundus Exam     External Exam       Right Left   External Normal Normal         Slit Lamp Exam       Right Left   Lids/Lashes Normal Normal   Conjunctiva/Sclera White and quiet White and quiet   Cornea Clear Clear   Anterior Chamber Deep and quiet Deep and quiet   Iris Round and reactive Round and reactive   Lens 2+ Nuclear sclerosis 2+ Nuclear sclerosis   Anterior Vitreous Normal Normal         Fundus Exam       Right Left   Posterior Vitreous Clear, vitrectomized Normal   Disc Normal Old and active neovascularization smaller than standard photo 10-A   C/D  Ratio 0.1 0.15   Macula Focal laser scars Focal laser scars   Vessels PDR-quiet PDR-quiet   Periphery Laser scars, good PRP clear media Laser scars, good PRP clear media            IMAGING AND PROCEDURES  Imaging and Procedures for 03/23/21  OCT, Retina - OU - Both Eyes       Right Eye Quality was good. Scan locations included subfoveal. Central Foveal Thickness: 272. Progression has been stable. Findings include normal observations.   Left Eye Quality was good. Scan locations included subfoveal. Central Foveal Thickness: 272. Progression has been stable. Findings include normal observations.   Notes No active diabetic maculopathy with proliferative diabetic retinopathy inactive     Color Fundus Photography Optos - OU - Both Eyes       Right Eye Progression has been stable. Disc findings include normal observations. Macula : microaneurysms.   Left Eye Progression has been stable. Disc findings include normal observations, neovascularization. Macula : microaneurysms.   Notes OU with PDR now quiescent.  PRP OU.  No active  CSME will observe             ASSESSMENT/PLAN:  Vitreous hemorrhage of left eye (HCC) Minor in the past now cleared  Proliferative diabetic retinopathy of left eye ([place]) Quiescent PDR good PRP  Proliferative diabetic retinopathy of right eye ([place]) Quiescent PDR clear media  Nuclear sclerotic cataract of both eyes Cataract(s) account for the patient's complaint. I discussed the risks and benefits of cataract surgery. Options were explained to the patient. The patient understands that new glasses may not improve their vision and desires to have cataract surgery. I have recommended follow up with their general eye care doctor for evaluation and consideration of cataract extraction with new intraocular lens insertion.  Will refer for cataract extraction with intraocular lens placement, Dr. Harrell Gave or Del[facility] , [facility]      ICD-10-CM   1. Vitreous hemorrhage of left eye ([place])  H43.12 OCT, Retina - OU - Both Eyes    Color Fundus Photography Optos - OU - Both Eyes    2. Proliferative diabetic retinopathy of right eye associated with type 2 diabetes mellitus, macular edema presence unspecified (HCC)  E11.3591 OCT, Retina - OU - Both Eyes    Color Fundus Photography Optos - OU - Both Eyes    3. Proliferative diabetic retinopathy of left eye without macular edema associated with type 2 diabetes mellitus ([place])  L87.5643     4. Nuclear sclerotic cataract of both eyes  H25.13       1.  OU with progressive nuclear sclerotic cataract changes.  Patient has now reached a standpoint his life where he is healthy enough to proceed with elective cataract extraction with intraocular lens placement, will refer to [facility], Dr. Harrell Gave or Dr. Delton Prairie  2.  3.  Ophthalmic Meds Ordered this visit:  No orders of the defined types were placed in this encounter.      Return in about 9 months (around 12/21/2021) for DILATE OU, COLOR FP, OCT.  There are no Patient Instructions on file for this visit.   Explained the diagnoses, plan, and follow up with the patient and they expressed understanding.  Patient expressed understanding of the importance of proper follow up care.   Clent Demark Rankin M.D. Diseases & Surgery of the Retina and Vitreous Retina & Diabetic [place] 03/23/21     Abbreviations: M myopia (nearsighted); A astigmatism; H hyperopia (farsighted); P presbyopia; Mrx spectacle prescription;  CTL contact lenses; OD right eye; OS left eye; OU both eyes  XT exotropia; ET esotropia; PEK punctate epithelial keratitis; PEE punctate epithelial erosions; DES dry eye syndrome; MGD meibomian gland dysfunction; ATs artificial tears; PFAT's preservative free artificial tears; [place] nuclear sclerotic cataract; PSC posterior subcapsular cataract; ERM epi-retinal membrane; PVD posterior vitreous detachment; RD  retinal detachment; DM diabetes mellitus; DR diabetic retinopathy; NPDR non-proliferative diabetic retinopathy; PDR proliferative diabetic retinopathy; CSME clinically significant macular edema; DME diabetic macular edema; dbh dot blot hemorrhages; CWS cotton wool spot; POAG primary open angle glaucoma; C/D cup-to-disc ratio; HVF humphrey visual field; GVF goldmann visual field; OCT optical coherence tomography; IOP intraocular pressure; BRVO Branch retinal vein occlusion; CRVO central retinal vein occlusion; CRAO central retinal artery occlusion; BRAO branch retinal artery occlusion; RT retinal tear; SB scleral buckle; PPV pars plana vitrectomy; VH Vitreous hemorrhage; PRP panretinal laser photocoagulation; IVK intravitreal kenalog; VMT vitreomacular traction; MH Macular hole;  NVD neovascularization of the disc; NVE neovascularization elsewhere; AREDS age related eye disease study; ARMD age related  macular degeneration; POAG primary open angle glaucoma; EBMD epithelial/anterior basement membrane dystrophy; ACIOL anterior chamber intraocular lens; IOL intraocular lens; PCIOL posterior chamber intraocular lens; Phaco/IOL phacoemulsification with intraocular lens placement; [place] photorefractive keratectomy; LASIK laser assisted in situ keratomileusis; HTN hypertension; DM diabetes mellitus; COPD chronic obstructive pulmonary disease

## 2021-03-25 ENCOUNTER — Telehealth: Payer: Self-pay | Admitting: Endocrinology

## 2021-03-25 NOTE — Telephone Encounter (Signed)
[place] called and is requesting that the [facility] be completed and returned

## 2021-03-25 NOTE — Telephone Encounter (Signed)
Form was completed and faxed. I refaxed today.

## 2021-04-16 ENCOUNTER — Other Ambulatory Visit: Payer: Self-pay | Admitting: Endocrinology

## 2021-04-16 DIAGNOSIS — E039 Hypothyroidism, unspecified: Secondary | ICD-10-CM

## 2021-04-16 DIAGNOSIS — E782 Mixed hyperlipidemia: Secondary | ICD-10-CM

## 2021-05-24 ENCOUNTER — Ambulatory Visit (INDEPENDENT_AMBULATORY_CARE_PROVIDER_SITE_OTHER): Payer: Medicaid Other | Admitting: Endocrinology

## 2021-05-24 ENCOUNTER — Other Ambulatory Visit: Payer: Self-pay

## 2021-05-24 VITALS — BP 160/100 | HR 86 | Ht 69.0 in

## 2021-05-24 DIAGNOSIS — Z794 Long term (current) use of insulin: Secondary | ICD-10-CM

## 2021-05-24 DIAGNOSIS — Z23 Encounter for immunization: Secondary | ICD-10-CM | POA: Diagnosis not present

## 2021-05-24 DIAGNOSIS — Z89511 Acquired absence of right leg below knee: Secondary | ICD-10-CM | POA: Diagnosis not present

## 2021-05-24 DIAGNOSIS — I10 Essential (primary) hypertension: Secondary | ICD-10-CM

## 2021-05-24 DIAGNOSIS — E1165 Type 2 diabetes mellitus with hyperglycemia: Secondary | ICD-10-CM

## 2021-05-24 LAB — POCT GLYCOSYLATED HEMOGLOBIN (HGB A1C): Hemoglobin A1C: 8.1 % — AB (ref 4.0–5.6)

## 2021-05-24 NOTE — Patient Instructions (Addendum)
Take 1 Prednisone in am then stop  Check blood sugars on waking up 7 days a week  Also check blood sugars about 2 hours after meals and do this after different meals by rotation  Recommended blood sugar levels on waking up are 90-130 and about 2 hours after meal is 130-160  Please bring your blood sugar monitor to each visit, thank you  No Carb entry for bolus if not eating  Double Carvedilol

## 2021-05-24 NOTE — Progress Notes (Signed)
Patient ID: Ethan Campbell, male   DOB: February 04, 1961, 60 y.o.   MRN: [id number]   Reason for Appointment: follow-up   History of Present Illness   Diagnosis: Type 2 DIABETES MELITUS, date of diagnosis: 2000    Previous history: he has been on insulin for several years with consistently poor control Has been requiring large doses of insulin for his diabetes but A1c has been persistently high Blood sugars did not improve significantly even with trying Byetta and Victoza In 2014 he was switched from NovoLog to U-500 insulin but not clear if he has had improvement in control except with fasting readings His prior A1c was 14.0 in 5/14 and he had educational discussions with diabetes educator and dietitian in 6/14   He was started on the OMNIPOD pump on 02/06/2018  Recent history:  Non-insulin hypoglycemic drugs: Victoza 1.8 mg daily Insulin: Humulin R U-500   Omnipod PUMP settings:   Basal rate settings 12 AM = 0.45, 2 AM = 0.35, 8 AM = 0.80,  and 4 PM = 1.20  Preset boluses: 4-7 units with IC ratio 10 Blood sugar target 150 between 12 AM-7 AM and then 130 Sensitivity 1: 50, active insulin 6 hours  His A1c is still high at 8.1  Current management, blood sugar patterns and problems identified:  He was not able to get the Dexcom sensor approved [facility]  He is checking his blood sugars less than 2 times per day on an average with his Accu-Chek  Most of his readings are whenever he is doing a bolus which may or may not be at the time of his meal  He says he forgets to bolus and generally boluses once or twice a day  Not clear if he is estimating his carbohydrate intake accurately Also once he 70 g of carbohydrate from midnight bolus which caused her sugar to be in the 60s the next day  Otherwise he has had only 1 more reading in the 60s midday  Only appears to have mostly high readings in the late afternoons and evenings only rarely below  200 after 7 PM His appetite has been variable Also has been on prednisone for the last 5 or 6 days for respiratory infection causing her sugars to be somewhat higher, highest recently 334  He does take his Victoza regularly   Mealtimes: 8 am 12 pm, 6 pm  Proper timing of medications in relation to meals: Yes, usually 30 minutes before eating .          Monitors blood glucose:  2-  4 times a day.    Glucometer: Accu-Chek  Blood sugar download results for the last 2 weeks   PRE-MEAL Fasting Lunch Dinner Bedtime Overall  Glucose range:     60-362  Mean/median: 170  255  216   POST-MEAL PC Breakfast PC Lunch PC Dinner  Glucose range:     Mean/median:   ?     Previously:  PRE-MEAL AC breakfast Lunch Dinner Bedtime Overall  Glucose range: 86-417 128-347 85-458 457   Mean/median:     ?    Usually eating 2 meals a day with blood sugars improving after his first meal but not to normal Glucose data incomplete after evening meal but likely somewhat better with pre-meal blood sugar around 280-290   Physical activity: exercise:none   Last dietitian visit: 6/18 and last CDE visit in 4/16  Wt Readings from Last 3 Encounters:  03/22/21 (!) 332 lb (150.6 kg)  02/08/21 (!) 336 lb 3.2 oz (152.5 kg)  08/25/20 (!) 301 lb (136.5 kg)    LABS:   Lab Results  Component Value Date   HGBA1C 8.1 (A) 05/24/2021   HGBA1C 7.9 (A) 12/28/2020   HGBA1C 7.2 (A) 06/15/2020   Lab Results  Component Value Date   MICROALBUR 16.7 (H) 08/19/2019   LDLCALC 90 02/10/2021   CREATININE 2.0 (A) 02/10/2021    Lab Results  Component Value Date   FRUCTOSAMINE 280 03/09/2020   FRUCTOSAMINE 341 (H) 08/19/2019   FRUCTOSAMINE 333 (H) 04/22/2019    OTHER active problems are discussed in review of systems   Allergies as of 05/24/2021   No Known Allergies      Medication List        Accurate as of May 24, 2021  3:37 PM. If you have any questions, ask your nurse or doctor.           aspirin 81 MG EC tablet Take 1 tablet (81 mg total) by mouth 2 (two) times a day.   atorvastatin 80 MG tablet Commonly known as: LIPITOR Take 1 tablet (80 mg total) by mouth daily.   blood glucose meter kit and supplies Dispense based on patient and insurance preference. Use up to four times daily as directed. (FOR ICD-10 E10.9, E11.9).   carvedilol 6.25 MG tablet Commonly known as: COREG Take 1 tablet (6.25 mg total) by mouth 2 (two) times daily with a meal.   Dexcom G6 Receiver Devi Use with Dexcom sensor/transmitter   Dexcom G6 Sensor Misc Use to monitor blood sugar, change after 10 days   DULoxetine 60 MG capsule Commonly known as: CYMBALTA Take 1 capsule (60 mg total) by mouth daily.   ezetimibe 10 MG tablet Commonly known as: ZETIA TAKE (1) TABLET BY MOUTH ONCE DAILY.   fenofibrate 145 MG tablet Commonly known as: TRICOR Take 1 tablet (145 mg total) by mouth daily.   freestyle lancets Use as instructed   furosemide 40 MG tablet Commonly known as: LASIX Take 40 mg by mouth. Take two tablets once daily   gabapentin 300 MG capsule Commonly known as: NEURONTIN Take 300 mg by mouth 3 (three) times daily. Take 334m by mouth three times daily.   glucose blood test strip Commonly known as: Accu-Chek Aviva Plus TEST UP TO 4 TIMES DAILY Dx code E11.65   HUMULIN R 500 UNIT/ML injection Generic drug: insulin regular human CONCENTRATED USE AS DIRECTED, INJECT UP TO 35 UNITS 3 TIMES A DAY.   Insulin Pen Needle 32G X 8 MM Misc Use as directed   levothyroxine 137 MCG tablet Commonly known as: SYNTHROID TAKE (1) TABLET BY MOUTH ONCE DAILY BEFORE BREAKFAST.   lisinopril 5 MG tablet Commonly known as: ZESTRIL Take 5 mg by mouth daily.   meclizine 25 MG tablet Commonly known as: ANTIVERT Take 1 tablet (25 mg total) by mouth every 8 (eight) hours as needed for dizziness.   midodrine 5 MG tablet Commonly known as: PROAMATINE Take 5 mg by mouth 3 (three) times  daily.   Multi-Vitamins Tabs Take by mouth.   OmniPod 10 Pack Misc by Does not apply route.   polyethylene glycol 17 g packet Commonly known as: MIRALAX / GLYCOLAX Take 17 g by mouth daily as needed for mild constipation.   tamsulosin 0.4 MG Caps capsule Commonly known as: FLOMAX Take by mouth.   tiZANidine 4 MG tablet Commonly known as: ZANAFLEX Take 4 mg  by mouth 2 (two) times daily.   Victoza 18 MG/3ML Sopn Generic drug: liraglutide Inject 0.3 mLs (1.8 mg total) into the skin daily.   Vitamin D (Ergocalciferol) 1.25 MG (50000 UNIT) Caps capsule Commonly known as: DRISDOL Take 50,000 Units by mouth once a week.   [place] 0.6 MG/0.6ML Soaj Generic drug: Dasiglucagon HCl Inject 0.6 mLs into the skin as needed. To be injected when unable to treat low blood sugars by mouth   zinc gluconate 50 MG tablet Take by mouth.        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 Brandon Melnick, uses bipap   Syncope    Type 2 diabetes mellitus ([place])    Venous insufficiency     Past Surgical History:  Procedure Laterality Date   AMPUTATION Right 06/20/2018   Procedure: RIGHT GREAT TOE AMPUTATION;  Surgeon: Newt Minion, MD;  Location: [facility];  Service: Orthopedics;  Laterality: Right;   AMPUTATION Right 09/26/2018   Procedure: RIGHT FOOT 5TH RAY AMPUTATION;  Surgeon: Newt Minion, MD;  Location: [facility];  Service: Orthopedics;  Laterality: Right;  MAC and regional anesthesia   AMPUTATION Right 10/05/2018   Procedure: RIGHT BELOW KNEE AMPUTATION;  Surgeon: Newt Minion, MD;  Location: [facility];  Service: Orthopedics;  Laterality: Right;   AMPUTATION Right 10/26/2018   Procedure: RIGHT ABOVE KNEE AMPUTATION;  Surgeon: Newt Minion, MD;  Location: [facility];  Service: Orthopedics;  Laterality: Right;   COLONOSCOPY     RESECTION BONE TUMOR FEMUR  1980's   Left femur, treated at [facility] with bone graft   STUMP REVISION Right 11/09/2018   Procedure: REVISION RIGHT ABOVE KNEE AMPUTATION;  Surgeon: Newt Minion, MD;  Location: [facility];  Service: Orthopedics;  Laterality: Right;   TOTAL KNEE ARTHROPLASTY Left 2013    Family History  Problem Relation Age of Onset   Heart attack Mother 67   Breast cancer Mother    Stroke Mother    Heart attack Father 76   Breast cancer Sister    Colon cancer Sister     Social History:  reports that he has quit smoking. His smoking use included cigarettes. He has never used smokeless tobacco. He reports that he does not drink alcohol and does not use drugs.  Review of Systems:    HYPOTHYROIDISM:   on  levothyroxine 137 ug daily The dose was increased in 12/2020 Has been taking the levothyroxine before breakfast daily Labs as follows   Lab Results  Component Value Date   TSH 3.30 02/10/2021   TSH 9.49 (H) 12/28/2020   TSH 2.70 03/09/2020   FREET4 0.95 03/09/2020  FREET4 0.95 08/19/2019   FREET4 0.93 06/10/2019     Lipids: triglycerides on labs on the last measurement are below 150 LDL has been controlled consistently He is on atorvastatin 80 mg, he thinks he is taking this regularly Because of his high LDL ezetimibe was added in 6/22  Followed by PCP   Lab Results  Component Value Date   CHOL 146 02/10/2021   HDL 38 02/10/2021   LDLCALC 90 02/10/2021   LDLDIRECT 80.0 03/07/2016   TRIG 93 02/10/2021   CHOLHDL 6  12/28/2020      CKD: His creatinine has been variable, seen periodically by nephrologist  Lab Results  Component Value Date   CREATININE 2.0 (A) 02/10/2021   CREATININE 1.75 (H) 12/28/2020   CREATININE 1.68 (H) 08/19/2019   Hypertension: Not clear why his blood pressure is high, appears to be needing antihypertensives now and is on carvedilol He says his blood pressure has been mostly about 140/85 and higher with prednisone lately  BP Readings from Last 3 Encounters:  05/24/21 (!) 160/100  03/22/21 (!) 152/92  02/08/21 130/80    History of CHF, taking Lasix using 40 mg tablet      Examination:   BP (!) 160/100 (Cuff Size: Large)   Pulse 86   Ht _0  (1.753 m)   SpO2 99%   BMI 49.03 kg/m   Body mass index is 49.03 kg/m.     ASSESSMENT/ PLAN:   Diabetes type 2 with obesity, insulin-dependent  See history of present illness for detailed discussion of current diabetes management, blood sugar patterns and problems identified  He has been on OMNIPOD insulin pump with U-500 insulin along with Victoza  A1c is last 7.9% and now 8.1  Blood sugars are being monitored periodically with his Accu-Chek monitor  Again is not able to get approval for any CGM apparently  His monitoring and blood sugar entry may be at variable times and may not be right at the mealtime Likely is missing about getting late boluses frequently Currently blood sugars averaging well over 200 after about 6 PM until late night either from late boluses or possibly needing higher basal rate Likely will not be approved for the OmniPod 5 pump Diet has been variable Also recently has had high sugars from prednisone  Recommendations: Again reminded him to check his blood sugars several times a day including after meals Needs to try and bolus before starting to eat consistently Try to measure carbohydrate accurately at the time of boluses Basal rate to 1.4 between 2 PM-12 AM Also he will reduce his basal  rate at 2 AM to 0.35 More consistent follow-up He will call if he has persistently high blood sugars  For his hypertension he needs to increase his carvedilol to 12.5 mg temporarily twice a day until seen by nephrologist  To check thyroid levels and lipids on the next visit  Follow-up in 2 months  Flu vaccine given  Patient Instructions  Take 1 Prednisone in am then stop  Check blood sugars on waking up 7 days a week  Also check blood sugars about 2 hours after meals and do this after different meals by rotation  Recommended blood sugar levels on waking up are 90-130 and about 2 hours after meal is 130-160  Please bring your blood sugar monitor to each visit, thank you  No Carb entry for bolus if not eating  Double Carvedilol         Elayne Snare 05/24/2021, 3:37  PM   Note: This office note was prepared with Estate agent. Any transcriptional errors that result from this process are unintentional.

## 2021-06-22 ENCOUNTER — Other Ambulatory Visit (HOSPITAL_COMMUNITY): Payer: Self-pay

## 2021-06-22 ENCOUNTER — Telehealth: Payer: Self-pay

## 2021-06-22 NOTE — Telephone Encounter (Signed)
Patient called:  Ethan Campbell insurance is requesting a PA for a renewal of of his [place]. It will download.  Call back phone [phone]

## 2021-08-02 ENCOUNTER — Ambulatory Visit: Payer: Medicaid Other | Admitting: Endocrinology

## 2021-08-30 ENCOUNTER — Other Ambulatory Visit: Payer: Self-pay | Admitting: Endocrinology

## 2021-09-01 ENCOUNTER — Ambulatory Visit (INDEPENDENT_AMBULATORY_CARE_PROVIDER_SITE_OTHER): Payer: Medicaid Other | Admitting: Endocrinology

## 2021-09-01 ENCOUNTER — Telehealth: Payer: Self-pay

## 2021-09-01 ENCOUNTER — Encounter: Payer: Self-pay | Admitting: Endocrinology

## 2021-09-01 ENCOUNTER — Other Ambulatory Visit: Payer: Self-pay

## 2021-09-01 VITALS — BP 118/76 | HR 87 | Ht 69.0 in

## 2021-09-01 DIAGNOSIS — Z6841 Body Mass Index (BMI) 40.0 and over, adult: Secondary | ICD-10-CM | POA: Diagnosis not present

## 2021-09-01 DIAGNOSIS — E1165 Type 2 diabetes mellitus with hyperglycemia: Secondary | ICD-10-CM

## 2021-09-01 DIAGNOSIS — Z794 Long term (current) use of insulin: Secondary | ICD-10-CM | POA: Diagnosis not present

## 2021-09-01 DIAGNOSIS — Z89511 Acquired absence of right leg below knee: Secondary | ICD-10-CM | POA: Diagnosis not present

## 2021-09-01 LAB — POCT GLYCOSYLATED HEMOGLOBIN (HGB A1C): Hemoglobin A1C: 13.1 % — AB (ref 4.0–5.6)

## 2021-09-01 LAB — POCT GLUCOSE (DEVICE FOR HOME USE): POC Glucose: >600 mg/dl (ref 70–99)

## 2021-09-01 MED ORDER — OMNIPOD CLASSIC PODS (GEN 3) MISC: 2 refills | Status: DC

## 2021-09-01 NOTE — Progress Notes (Signed)
1/31/

## 2021-09-01 NOTE — Telephone Encounter (Signed)
Spoke with rep from [facility], patient's current order is pending shipment and has been marked urgent so that it will be processed tomorrow morning with estimated shipping of 3-7 days.

## 2021-09-01 NOTE — Patient Instructions (Addendum)
Insulin shots 18 units on waking up, 14 at lunch and supper and 12 at bedtime  Sugar >300 add 4 more units to above

## 2021-09-01 NOTE — Progress Notes (Signed)
Patient ID: Ethan Campbell, male   DOB: 10-28-1960, 61 y.o.   MRN: [id number]   Reason for Appointment: follow-up   History of Present Illness   Diagnosis: Type 2 DIABETES MELITUS, date of diagnosis: 2000    Previous history: he has been on insulin for several years with consistently poor control Has been requiring large doses of insulin for his diabetes but A1c has been persistently high Blood sugars did not improve significantly even with trying Byetta and Victoza In 2014 he was switched from NovoLog to U-500 insulin but not clear if he has had improvement in control except with fasting readings His prior A1c was 14.0 in 5/14 and he had educational discussions with diabetes educator and dietitian in 6/14   He was started on the OMNIPOD pump on 02/06/2018  Recent history:  Non-insulin hypoglycemic drugs: Victoza 1.8 mg daily  Insulin: Humulin R U-500, 10 units 3-4 times a day with syringe   PREVIOUS Omnipod PUMP settings:   Basal rate settings 12 AM = 0.45, 2 AM = 0.35, 8 AM = 0.80,  and 4 PM = 1.20  Preset boluses: 4-7 units with IC ratio 10 Blood sugar target 150 between 12 AM-7 AM and then 130 Sensitivity 1: 50, active insulin 6 hours  His A1c is unusually high at 13.1  Current management, blood sugar patterns and problems identified:  He was not able to get the OmniPod supplies because of lack of insurance coverage for the last few weeks and not clear by this was denied even though he had been getting it through the insurance for a couple of years with benefit  He also has not been able to get coverage for any continuous glucose monitoring despite his needing to have this to enable him to adjust his insulin with every meal and get blood sugar patterns overnight  He thinks his blood sugars are mostly 300-500 with lower readings in the mornings  Recently at the emergency room his blood sugar was about 600 This is without any prednisone  Although he does not  appear to have any symptoms of the high sugars except thirst he has had recent problems with hypokalemia also  He thinks he is still eating about 2 meals a day generally at noon and early evening He does not make adjustments to his insulin dose based on his blood sugar level or his portions Appetite is fairly good Unable to weigh him today  He does take his Victoza regularly   Mealtimes: 8 am 12 pm, 6 pm  Proper timing of medications in relation to meals: Yes, usually 30 minutes before eating .          Monitors blood glucose:  2-  4 times a day.    Glucometer: Accu-Chek  Blood sugar readings by recall, patient did not have meter available   PRE-MEAL Fasting Lunch Dinner Bedtime Overall  Glucose range: 300 400  500   Mean/median:        Prior   PRE-MEAL Fasting Lunch Dinner Bedtime Overall  Glucose range:     60-362  Mean/median: 170  255  216   POST-MEAL PC Breakfast PC Lunch PC Dinner  Glucose range:     Mean/median:   ?     Usually eating 2 meals a day with blood sugars improving after his first meal but not to normal Glucose data incomplete after evening meal but likely somewhat better with pre-meal blood sugar around 280-290   Physical activity:  exercise:none   Last dietitian visit: 6/18 and last CDE visit in 4/16  Wt Readings from Last 3 Encounters:  03/22/21 (!) 332 lb (150.6 kg)  02/08/21 (!) 336 lb 3.2 oz (152.5 kg)  08/25/20 (!) 301 lb (136.5 kg)    LABS:   Lab Results  Component Value Date   HGBA1C 13.1 (A) 09/01/2021   HGBA1C 8.1 (A) 05/24/2021   HGBA1C 7.9 (A) 12/28/2020   Lab Results  Component Value Date   MICROALBUR 16.7 (H) 08/19/2019   LDLCALC 90 02/10/2021   CREATININE 2.0 (A) 02/10/2021    Lab Results  Component Value Date   FRUCTOSAMINE 280 03/09/2020   FRUCTOSAMINE 341 (H) 08/19/2019   FRUCTOSAMINE 333 (H) 04/22/2019    OTHER active problems are discussed in review of systems   Allergies as of 09/01/2021   No Known  Allergies      Medication List        Accurate as of September 01, 2021  3:58 PM. If you have any questions, ask your nurse or doctor.          aspirin 81 MG EC tablet Take 1 tablet (81 mg total) by mouth 2 (two) times a day.   atorvastatin 80 MG tablet Commonly known as: LIPITOR Take 1 tablet (80 mg total) by mouth daily.   blood glucose meter kit and supplies Dispense based on patient and insurance preference. Use up to four times daily as directed. (FOR ICD-10 E10.9, E11.9).   carvedilol 6.25 MG tablet Commonly known as: COREG Take 1 tablet (6.25 mg total) by mouth 2 (two) times daily with a meal.   Dexcom G6 Receiver Devi Use with Dexcom sensor/transmitter   Dexcom G6 Sensor Misc Use to monitor blood sugar, change after 10 days   DULoxetine 60 MG capsule Commonly known as: CYMBALTA Take 1 capsule (60 mg total) by mouth daily.   ezetimibe 10 MG tablet Commonly known as: ZETIA TAKE (1) TABLET BY MOUTH ONCE DAILY.   fenofibrate 145 MG tablet Commonly known as: TRICOR Take 1 tablet (145 mg total) by mouth daily.   freestyle lancets Use as instructed   furosemide 40 MG tablet Commonly known as: LASIX Take 40 mg by mouth. Take two tablets once daily   gabapentin 300 MG capsule Commonly known as: NEURONTIN Take 300 mg by mouth 3 (three) times daily. Take 343m by mouth three times daily.   glucose blood test strip Commonly known as: Accu-Chek Aviva Plus TEST UP TO 4 TIMES DAILY Dx code E11.65   HUMULIN R 500 UNIT/ML injection Generic drug: insulin regular human CONCENTRATED USE AS DIRECTED, INJECT UP TO 35 UNITS 3 TIMES A DAY.   Insulin Pen Needle 32G X 8 MM Misc Use as directed   levothyroxine 137 MCG tablet Commonly known as: SYNTHROID TAKE (1) TABLET BY MOUTH ONCE DAILY BEFORE BREAKFAST.   lisinopril 5 MG tablet Commonly known as: ZESTRIL Take 5 mg by mouth daily.   meclizine 25 MG tablet Commonly known as: ANTIVERT Take 1 tablet (25 mg  total) by mouth every 8 (eight) hours as needed for dizziness.   midodrine 5 MG tablet Commonly known as: PROAMATINE Take 5 mg by mouth 3 (three) times daily.   Multi-Vitamins Tabs Take by mouth.   OmniPod 10 Pack Misc by Does not apply route.   polyethylene glycol 17 g packet Commonly known as: MIRALAX / GLYCOLAX Take 17 g by mouth daily as needed for mild constipation.   tamsulosin 0.4 MG Caps  capsule Commonly known as: FLOMAX Take by mouth.   tiZANidine 4 MG tablet Commonly known as: ZANAFLEX Take 4 mg by mouth 2 (two) times daily.   Victoza 18 MG/3ML Sopn Generic drug: liraglutide Inject 0.3 mLs (1.8 mg total) into the skin daily.   Vitamin D (Ergocalciferol) 1.25 MG (50000 UNIT) Caps capsule Commonly known as: DRISDOL Take 50,000 Units by mouth once a week.   Zegalogue 0.6 MG/0.6ML Soaj Generic drug: Dasiglucagon HCl Inject 0.6 mLs into the skin as needed. To be injected when unable to treat low blood sugars by mouth   zinc gluconate 50 MG tablet Take by mouth.        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 Brandon Melnick, uses bipap   Syncope    Type 2 diabetes mellitus ([place])    Venous insufficiency     Past Surgical History:  Procedure Laterality Date   AMPUTATION Right 06/20/2018   Procedure: RIGHT GREAT TOE AMPUTATION;  Surgeon: Newt Minion,  MD;  Location: [place];  Service: Orthopedics;  Laterality: Right;   AMPUTATION Right 09/26/2018   Procedure: RIGHT FOOT 5TH RAY AMPUTATION;  Surgeon: Newt Minion, MD;  Location: [place];  Service: Orthopedics;  Laterality: Right;  MAC and regional anesthesia   AMPUTATION Right 10/05/2018   Procedure: RIGHT BELOW KNEE AMPUTATION;  Surgeon: Newt Minion, MD;  Location: [place];  Service: Orthopedics;  Laterality: Right;   AMPUTATION Right 10/26/2018   Procedure: RIGHT ABOVE KNEE AMPUTATION;  Surgeon: Newt Minion, MD;  Location: [place];  Service: Orthopedics;  Laterality: Right;   COLONOSCOPY     RESECTION BONE TUMOR FEMUR  1980's   Left femur, treated at [facility] with bone graft   STUMP REVISION Right 11/09/2018   Procedure: REVISION RIGHT ABOVE KNEE AMPUTATION;  Surgeon: Newt Minion, MD;  Location: [place];  Service: Orthopedics;  Laterality: Right;   TOTAL KNEE ARTHROPLASTY Left 2013    Family History  Problem Relation Age of Onset   Heart attack Mother 64   Breast cancer Mother    Stroke Mother    Heart attack Father 60   Breast cancer Sister    Colon cancer Sister     Social History:  reports that he has quit smoking. His smoking use included cigarettes. He has never used smokeless tobacco. He reports that he does not drink alcohol and does not use drugs.  Review of Systems:    HYPOTHYROIDISM:   on  levothyroxine 137 ug daily The dose was increased in 12/2020 Has been taking the levothyroxine before breakfast  Labs as follows, no recent labs available from PCP  Lab Results  Component Value Date   TSH 3.30 02/10/2021   TSH 9.49 (H) 12/28/2020   TSH 2.70 03/09/2020   FREET4 0.95 03/09/2020   FREET4 0.95 08/19/2019   FREET4 0.93 06/10/2019     Lipids: triglycerides on labs on the last measurement are below 150 LDL has been controlled consistently He is on atorvastatin 80 mg, he thinks he is taking this regularly Because of his high LDL ezetimibe was added in  6/22  Followed by PCP   Lab Results  Component Value Date   CHOL 146 02/10/2021   HDL 38 02/10/2021   LDLCALC 90 02/10/2021   LDLDIRECT 80.0 03/07/2016   TRIG 93 02/10/2021   CHOLHDL 6 12/28/2020      CKD: His creatinine has been variable, seen periodically by nephrologist  Lab Results  Component Value Date   CREATININE 2.0 (A) 02/10/2021   CREATININE 1.75 (H) 12/28/2020   CREATININE 1.68 (H) 08/19/2019   Hypertension: Managed by nephrologist  BP Readings from Last 3 Encounters:  09/01/21 118/76  05/24/21 (!) 160/100  03/22/21 (!) 152/92    History of CHF, taking Lasix using 40 mg tablet      Examination:   BP 118/76 (BP Location: Left Arm, Patient Position: Sitting, Cuff Size: Normal)    Pulse 87    Ht _0  (1.753 m)    SpO2 99%    BMI 49.03 kg/m   Body mass index is 49.03 kg/m.     ASSESSMENT/ PLAN:   Diabetes type 2 with obesity, insulin-dependent  See history of present illness for detailed discussion of current diabetes management, blood sugar patterns and problems identified  He previously had been on OMNIPOD insulin pump with U-500 insulin along with Victoza His insurance has not allowed him to get his OmniPod supplies and blood sugars are significantly high and likely averaging around 400 He did not bring his monitor for download  A1c is much higher than usual at 13.1  Blood sugars are being monitored periodically with his Accu-Chek monitor  Again is not able to get approval for any CGM for unknown reasons He has taken 10 units of the Humulin R with a regular insulin syringe 4 times a day reportedly equivalent to about 200 units of U-100 insulin He thinks he is regular with his insulin doses and not getting any steroids or change in diet  Plan: Will need to check with his supplier why he has not had any coverage for the pump He will go up to 18 units for his first insulin dose and then take 14 before lunch and dinner and 12 at bedtime for  now He will take his first dose on waking up regardless of blood sugar He will also take additional 4 units if blood sugars are over 300 at any time We will contact [facility] to see by his insurance is not approving the pump Also may try to get the freestyle libre approved Increase fluid intake He needs to follow-up with his PCP regarding hypokalemia   To check thyroid levels and lipids on the next visit  Follow-up in 3 weeks   Patient Instructions  Insulin shots 18 units on waking up, 14 at lunch and supper and 12 at bedtime  Sugar >300 add 4 more units to above           Elayne Snare 09/01/2021, 3:58 PM   Note: This office note was prepared with Dragon voice recognition system technology. Any transcriptional errors that result from  this process are unintentional.

## 2021-09-23 ENCOUNTER — Telehealth: Payer: Self-pay | Admitting: Endocrinology

## 2021-09-23 ENCOUNTER — Ambulatory Visit: Payer: Medicaid Other | Admitting: Endocrinology

## 2021-09-23 NOTE — Telephone Encounter (Signed)
Ethan Campbell called to let Dr. Dwyane Dee know that Ethan Campbell was ambulanced to [facility] this morning due to Ethan Campbell had blood sugars that were 42-45 and Ethan Campbell was unresponsive. ?

## 2021-09-29 NOTE — Telephone Encounter (Signed)
Patient has been rescheduled for appointment on 10/14/21 ?

## 2021-10-14 ENCOUNTER — Encounter: Payer: Self-pay | Admitting: Endocrinology

## 2021-10-14 ENCOUNTER — Other Ambulatory Visit: Payer: Self-pay

## 2021-10-14 ENCOUNTER — Ambulatory Visit (INDEPENDENT_AMBULATORY_CARE_PROVIDER_SITE_OTHER): Payer: Medicaid Other | Admitting: Endocrinology

## 2021-10-14 VITALS — BP 118/70 | HR 63

## 2021-10-14 DIAGNOSIS — E039 Hypothyroidism, unspecified: Secondary | ICD-10-CM

## 2021-10-14 DIAGNOSIS — E1165 Type 2 diabetes mellitus with hyperglycemia: Secondary | ICD-10-CM | POA: Diagnosis not present

## 2021-10-14 DIAGNOSIS — Z794 Long term (current) use of insulin: Secondary | ICD-10-CM | POA: Diagnosis not present

## 2021-10-14 LAB — BASIC METABOLIC PANEL
BUN: 33 mg/dL — ABNORMAL HIGH (ref 6–23)
CO2: 35 mEq/L — ABNORMAL HIGH (ref 19–32)
Calcium: 8.7 mg/dL (ref 8.4–10.5)
Chloride: 104 mEq/L (ref 96–112)
Creatinine, Ser: 2.52 mg/dL — ABNORMAL HIGH (ref 0.40–1.50)
GFR: 26.98 mL/min — ABNORMAL LOW (ref >60.00)
Glucose, Bld: 179 mg/dL — ABNORMAL HIGH (ref 70–99)
Potassium: 4.4 mEq/L (ref 3.5–5.1)
Sodium: 143 mEq/L (ref 135–145)

## 2021-10-14 LAB — T4, FREE: Free T4: 1.01 ng/dL (ref 0.60–1.60)

## 2021-10-14 LAB — TSH: TSH: 12.39 u[IU]/mL — ABNORMAL HIGH (ref 0.35–5.50)

## 2021-10-14 MED ORDER — FREESTYLE LIBRE 2 SENSOR MISC: 2 refills | Status: DC

## 2021-10-14 MED ORDER — FREESTYLE LIBRE 2 READER DEVI: 0 refills | Status: DC

## 2021-10-14 NOTE — Progress Notes (Signed)
? ? ?Patient ID: Ethan Campbell, male   DOB: June 02, 1961, 61 y.o.   MRN: [id number] ? ? ?Reason for Appointment: follow-up  ? ?History of Present Illness  ? ?Diagnosis: Type 2 DIABETES MELITUS, date of diagnosis: 2000   ? ?Previous history: he has been on insulin for several years with consistently poor control ?Has been requiring large doses of insulin for his diabetes but A1c has been persistently high ?Blood sugars did not improve significantly even with trying Byetta and Victoza ?In 2014 he was switched from NovoLog to U-500 insulin but not clear if he has had improvement in control except with fasting readings ?His prior A1c was 14.0 in 5/14 and he had educational discussions with diabetes educator and dietitian in 6/14 ? ? He was started on the OMNIPOD pump on 02/06/2018 ? ?Recent history: ? ?Non-insulin hypoglycemic drugs: Victoza 1.8 mg daily ? ?Insulin: Humulin R U-500 ? ? Omnipod PUMP settings:   Basal rate settings ?12 AM = 0.45, 2 AM = 0.35, 8 AM = 0.80,  and 4 PM = 1.20 ? ?Preset boluses: 4-7 units with IC ratio 10 ?Blood sugar target 150 between 12 AM-7 AM and then 130 ?Sensitivity 1: 50, active insulin 6 hours ? ?His A1c is last high at 13.1 ? ?Current management, blood sugar patterns and problems identified: ? ?He was finally able to get the OmniPod supplies ?However appears that his pump is programmed about 12 hours behind.  This may have happened when he changes his battery  ?He also thinks he has had some difficulties with vision  ?Previously on the injectable U-500 insulin his blood sugars are markedly increased  ?However now he is getting frequent low blood sugars but not at consistent times ?He has had low blood sugars 3 times in the morning hours, twice midday and twice around dinnertime  ?Also not clear why he was not able to continue using the freestyle libre that he had previously used ?His bolus data on the pump is not available consistently and not clear if this is due to difficulty  with the download are whether he is taking boluses consistently because of fear of low sugars ?Appetite is somewhat decreased apparently but variable, he had [place] food for lunch today ?Unable to weigh him today ? ?He does take his Victoza regularly ? ? ?Mealtimes: 8 am 12 pm, 6 pm ? ?Proper timing of medications in relation to meals: Yes, usually 30 minutes before eating .    ?      ?Monitors blood glucose:  2-  4 times a day.    Glucometer: Accu-Chek ? ?Blood sugar readings by review of his monitor:  ? ? ?PRE-MEAL Fasting Lunch Dinner Bedtime Overall  ?Glucose range: 32-352 30, 62 37-228 83   ?Mean/median:     119  ? ? ? ?Usually eating 2 meals a day with blood sugars improving after his first meal but not to normal ?Glucose data incomplete after evening meal but likely somewhat better with pre-meal blood sugar around 280-290 ? ? ?Physical activity: exercise:none ?  ?Last dietitian visit: 6/18 and last CDE visit in 4/16 ? ?Wt Readings from Last 3 Encounters:  ?03/22/21 (!) 332 lb (150.6 kg)  ?02/08/21 (!) 336 lb 3.2 oz (152.5 kg)  ?08/25/20 (!) 301 lb (136.5 kg)  ? ? ?LABS:  ? ?Lab Results  ?Component Value Date  ? HGBA1C 13.1 (A) 09/01/2021  ? HGBA1C 8.1 (A) 05/24/2021  ? HGBA1C 7.9 (A) 12/28/2020  ? ?Lab Results  ?  Component Value Date  ? MICROALBUR 16.7 (H) 08/19/2019  ? [place] 90 02/10/2021  ? CREATININE 2.0 (A) 02/10/2021  ? ? ?Lab Results  ?Component Value Date  ? FRUCTOSAMINE 280 03/09/2020  ? FRUCTOSAMINE 341 (H) 08/19/2019  ? FRUCTOSAMINE 333 (H) 04/22/2019  ? ? ?OTHER active problems are discussed in review of systems ? ? ?Allergies as of 10/14/2021   ?No Known Allergies ?  ? ?  ?Medication List  ?  ? ?  ? Accurate as of October 14, 2021  2:07 PM. If you have any questions, ask your nurse or doctor.  ?  ?  ? ?  ? ?aspirin 81 MG EC tablet ?Take 1 tablet (81 mg total) by mouth 2 (two) times a day. ?  ?atorvastatin 80 MG tablet ?Commonly known as: LIPITOR ?Take 1 tablet (80 mg total) by mouth daily. ?   ?blood glucose meter kit and supplies ?Dispense based on patient and insurance preference. Use up to four times daily as directed. (FOR ICD-10 E10.9, E11.9). ?  ?carvedilol 6.25 MG tablet ?Commonly known as: COREG ?Take 1 tablet (6.25 mg total) by mouth 2 (two) times daily with a meal. ?  ?Dexcom G6 Receiver Devi ?Use with Dexcom sensor/transmitter ?  ?Dexcom G6 Sensor Misc ?Use to monitor blood sugar, change after 10 days ?  ?DULoxetine 60 MG capsule ?Commonly known as: CYMBALTA ?Take 1 capsule (60 mg total) by mouth daily. ?  ?ezetimibe 10 MG tablet ?Commonly known as: ZETIA ?TAKE (1) TABLET BY MOUTH ONCE DAILY. ?  ?fenofibrate 145 MG tablet ?Commonly known as: TRICOR ?Take 1 tablet (145 mg total) by mouth daily. ?  ?freestyle lancets ?Use as instructed ?  ?furosemide 40 MG tablet ?Commonly known as: LASIX ?Take 40 mg by mouth. Take two tablets once daily ?  ?gabapentin 300 MG capsule ?Commonly known as: NEURONTIN ?Take 300 mg by mouth 3 (three) times daily. Take 333m by mouth three times daily. ?  ?glucose blood test strip ?Commonly known as: Accu-Chek Aviva Plus ?TEST UP TO 4 TIMES DAILY Dx code E11.65 ?  ?HUMULIN R 500 UNIT/ML injection ?Generic drug: insulin regular human CONCENTRATED ?USE AS DIRECTED, INJECT UP TO 35 UNITS 3 TIMES A DAY. ?  ?Insulin Pen Needle 32G X 8 MM Misc ?Use as directed ?  ?levothyroxine 137 MCG tablet ?Commonly known as: SYNTHROID ?TAKE (1) TABLET BY MOUTH ONCE DAILY BEFORE BREAKFAST. ?  ?lisinopril 5 MG tablet ?Commonly known as: ZESTRIL ?Take 5 mg by mouth daily. ?  ?meclizine 25 MG tablet ?Commonly known as: ANTIVERT ?Take 1 tablet (25 mg total) by mouth every 8 (eight) hours as needed for dizziness. ?  ?midodrine 5 MG tablet ?Commonly known as: PROAMATINE ?Take 5 mg by mouth 3 (three) times daily. ?  ?Multi-Vitamins Tabs ?Take by mouth. ?  ?Omnipod Classic Pods (Gen 3) Misc ?Use for insulin delivery and change every 48 hours ?  ?polyethylene glycol 17 g packet ?Commonly known as:  MIRALAX / GLYCOLAX ?Take 17 g by mouth daily as needed for mild constipation. ?  ?tamsulosin 0.4 MG Caps capsule ?Commonly known as: FLOMAX ?Take by mouth. ?  ?tiZANidine 4 MG tablet ?Commonly known as: ZANAFLEX ?Take 4 mg by mouth 2 (two) times daily. ?  ?Victoza 18 MG/3ML Sopn ?Generic drug: liraglutide ?Inject 0.3 mLs (1.8 mg total) into the skin daily. ?  ?Vitamin D (Ergocalciferol) 1.25 MG (50000 UNIT) Caps capsule ?Commonly known as: DRISDOL ?Take 50,000 Units by mouth once a week. ?  ?Zegalogue  0.6 MG/0.6ML Soaj ?Generic drug: Dasiglucagon HCl ?Inject 0.6 mLs into the skin as needed. To be injected when unable to treat low blood sugars by mouth ?  ?zinc gluconate 50 MG tablet ?Take by mouth. ?  ? ?  ? ? ?Allergies: No Known Allergies ? ?Past Medical History:  ?Diagnosis Date  ? Arm vein blood clot, left   ? on Coumadin  ? Arthritis   ? Cancer [facility])   ? Bone cancer 1987 (in knee Large cell tumor)  ? Chronic kidney disease   ? stage 3  ? Depression   ? Essential hypertension   ? Family history of adverse reaction to anesthesia   ? " my mother woke up during a cardiac surgery "  ? History of stroke   ? Hyperlipidemia   ? Hypothyroidism   ? Insomnia   ? Obesity   ? Precordial pain June 2011  ? Nuclear stress; no ischemia; EF 60%  ? Presence of permanent cardiac pacemaker   ? Proteinuria   ? Seizure disorder ([place])   ? Sick sinus syndrome ([place])   ? Sleep apnea   ? Dr. Brandon Melnick, uses bipap  ? Syncope   ? Type 2 diabetes mellitus ([place])   ? Venous insufficiency   ? ? ?Past Surgical History:  ?Procedure Laterality Date  ? AMPUTATION Right 06/20/2018  ? Procedure: RIGHT GREAT TOE AMPUTATION;  Surgeon: Newt Minion, MD;  Location: [place];  Service: Orthopedics;  Laterality: Right;  ? AMPUTATION Right 09/26/2018  ? Procedure: RIGHT FOOT 5TH RAY AMPUTATION;  Surgeon: Newt Minion, MD;  Location: [place];  Service: Orthopedics;  Laterality: Right;  MAC and regional anesthesia  ? AMPUTATION Right 10/05/2018  ? Procedure: RIGHT  BELOW KNEE AMPUTATION;  Surgeon: Newt Minion, MD;  Location: [place];  Service: Orthopedics;  Laterality: Right;  ? AMPUTATION Right 10/26/2018  ? Procedure: RIGHT ABOVE KNEE AMPUTATION;  Surgeon: Johnston Ebbs

## 2021-10-15 ENCOUNTER — Other Ambulatory Visit: Payer: Self-pay | Admitting: Endocrinology

## 2021-11-09 ENCOUNTER — Ambulatory Visit: Payer: Medicaid Other | Admitting: Endocrinology

## 2021-11-24 NOTE — Progress Notes (Signed)
Thyroid level was significantly low on the last visit and need to change his prescription to 150 mcg levothyroxine and make sure he takes it every morning.  Also needs follow-up visit scheduled

## 2021-11-25 ENCOUNTER — Telehealth: Payer: Self-pay | Admitting: Pharmacy Technician

## 2021-11-25 ENCOUNTER — Other Ambulatory Visit (HOSPITAL_COMMUNITY): Payer: Self-pay

## 2021-11-25 NOTE — Telephone Encounter (Signed)
Patient Advocate Encounter ? ?Received notification from PHARMACY that prior authorization for [place], SENSOR, & TRANSMITTER is required. ?  ?PA submitted on 5.4.23 ?RECEIVER KEY: [place] Tracks [id number] W ?SENSOR KEY: [place] Tracks [id number] W ?TRANSMITTER KEY:  Tracks ?[id number] W ?Status is pending ?  ?[facility] will continue to follow ? ?APPROVED ? ?Received notification from [place] regarding a prior authorization for [place], SENSOR, & TRANSMITTER. Authorization has been APPROVED from 5.4.23 to 6.3.23.  ? ?Per test claim, copay for 30 days supply is $0 for all Dexcom Products ? ? [employer] # [id number] ?APPROVAL 5.4.23. TO 6.2.23 ?SENSOR Auth #[id number] ?APPROVAL 5.4.23 TO 11.4.23 ?TRANSMITTER Auth# [id number] ?APPROVAL 5.4.23 TO 8.2.23 ? ? ?Maziah Smola R Colton Tassin, CPhT ?Patient Advocate ?[facility] ?Phone: [phone] ?Fax:  [phone] ?

## 2021-11-26 ENCOUNTER — Other Ambulatory Visit: Payer: Self-pay

## 2021-11-26 DIAGNOSIS — E1165 Type 2 diabetes mellitus with hyperglycemia: Secondary | ICD-10-CM

## 2021-11-26 MED ORDER — DEXCOM G6 TRANSMITTER MISC: 3 refills | Status: DC

## 2021-11-26 MED ORDER — LEVOTHYROXINE SODIUM 150 MCG PO TABS: 150.0000 ug | ORAL_TABLET | Freq: Every day | ORAL | 3 refills | Status: DC

## 2021-11-26 MED ORDER — DEXCOM G6 SENSOR MISC: 3 refills | Status: DC

## 2021-11-26 MED ORDER — DEXCOM G6 RECEIVER DEVI: 0 refills | Status: DC

## 2021-11-26 NOTE — Addendum Note (Signed)
Addended by: Cinda Quest on: 11/26/2021 11:14 AM ? ? Modules accepted: Orders ? ?

## 2021-11-26 NOTE — Telephone Encounter (Signed)
Rx sent to [facility] pharmacies ?

## 2021-12-21 ENCOUNTER — Encounter (INDEPENDENT_AMBULATORY_CARE_PROVIDER_SITE_OTHER): Payer: Medicaid Other | Admitting: Ophthalmology

## 2021-12-28 ENCOUNTER — Encounter (INDEPENDENT_AMBULATORY_CARE_PROVIDER_SITE_OTHER): Payer: Medicaid Other | Admitting: Ophthalmology

## 2022-01-12 ENCOUNTER — Encounter (INDEPENDENT_AMBULATORY_CARE_PROVIDER_SITE_OTHER): Payer: Self-pay | Admitting: Ophthalmology

## 2022-01-12 ENCOUNTER — Ambulatory Visit (INDEPENDENT_AMBULATORY_CARE_PROVIDER_SITE_OTHER): Payer: Medicaid Other | Admitting: Ophthalmology

## 2022-01-12 ENCOUNTER — Encounter (INDEPENDENT_AMBULATORY_CARE_PROVIDER_SITE_OTHER): Payer: Medicaid Other | Admitting: Ophthalmology

## 2022-01-12 DIAGNOSIS — E113592 Type 2 diabetes mellitus with proliferative diabetic retinopathy without macular edema, left eye: Secondary | ICD-10-CM | POA: Diagnosis not present

## 2022-01-12 DIAGNOSIS — E113591 Type 2 diabetes mellitus with proliferative diabetic retinopathy without macular edema, right eye: Secondary | ICD-10-CM | POA: Diagnosis not present

## 2022-01-12 DIAGNOSIS — H4312 Vitreous hemorrhage, left eye: Secondary | ICD-10-CM | POA: Diagnosis not present

## 2022-01-12 NOTE — Assessment & Plan Note (Signed)
Quiescent PDR OD 

## 2022-01-12 NOTE — Assessment & Plan Note (Signed)
Increasing vitreous hemorrhage OS from PDR active

## 2022-01-12 NOTE — Assessment & Plan Note (Addendum)
Progressive PDR, despite PRP now with preretinal vitreous hemorrhage visible documented on color fundus photography.  Will need additional panretinal photocoagulation left eye today to in order to prevent progression

## 2022-01-12 NOTE — Progress Notes (Signed)
01/12/2022     CHIEF COMPLAINT Patient presents for  Chief Complaint  Patient presents with   Retina Follow Up      HISTORY OF PRESENT ILLNESS: Ethan Campbell is a 61 y.o. male who presents to the clinic today for:   HPI     Retina Follow Up           Diagnosis: Other   Laterality: left eye   Severity: moderate   Course: stable         Comments   9 MOS FU OU OCT FP Pt stated vision has been stable since last visit however, the left eye seems a little cloudy at the top. Pt denies floaters and FOL.        Last edited by Silvestre Moment on 01/12/2022 10:31 AM.      Referring physician: Monico Blitz, MD [place],  [place] [address]  HISTORICAL INFORMATION:   Selected notes from the MEDICAL RECORD NUMBER    Lab Results  Component Value Date   HGBA1C 13.1 (A) 09/01/2021     CURRENT MEDICATIONS: No current outpatient medications on file. (Ophthalmic Drugs)   No current facility-administered medications for this visit. (Ophthalmic Drugs)   Current Outpatient Medications (Other)  Medication Sig   aspirin EC 81 MG EC tablet Take 1 tablet (81 mg total) by mouth 2 (two) times a day.   atorvastatin (LIPITOR) 80 MG tablet Take 1 tablet (80 mg total) by mouth daily.   blood glucose meter kit and supplies Dispense based on patient and insurance preference. Use up to four times daily as directed. (FOR ICD-10 E10.9, E11.9).   carvedilol (COREG) 6.25 MG tablet Take 1 tablet (6.25 mg total) by mouth 2 (two) times daily with a meal.   Continuous Blood Gluc Receiver (DEXCOM G6 RECEIVER) DEVI Check blood sugar daily   Continuous Blood Gluc Receiver (FREESTYLE LIBRE 2 READER) DEVI Use to check blood sugar daily   Continuous Blood Gluc Sensor (DEXCOM G6 SENSOR) MISC Change every 10 days   Continuous Blood Gluc Sensor (FREESTYLE LIBRE 2 SENSOR) MISC Change every 14 days   Continuous Blood Gluc Transmit (DEXCOM G6 TRANSMITTER) MISC Change every 3 months   Dasiglucagon HCl  (ZEGALOGUE) 0.6 MG/0.6ML SOAJ Inject 0.6 mLs into the skin as needed. To be injected when unable to treat low blood sugars by mouth   DULoxetine (CYMBALTA) 60 MG capsule Take 1 capsule (60 mg total) by mouth daily.   ezetimibe (ZETIA) 10 MG tablet TAKE (1) TABLET BY MOUTH ONCE DAILY.   fenofibrate (TRICOR) 145 MG tablet Take 1 tablet (145 mg total) by mouth daily.   furosemide (LASIX) 40 MG tablet Take 40 mg by mouth. Take two tablets once daily   gabapentin (NEURONTIN) 300 MG capsule Take 300 mg by mouth 3 (three) times daily. Take 364m by mouth three times daily.   glucose blood (ACCU-CHEK AVIVA PLUS) test strip TEST UP TO 4 TIMES DAILY Dx code E11.65   HUMULIN R 500 UNIT/ML injection USE AS DIRECTED, INJECT UP TO 35 UNITS 3 TIMES A DAY.   Insulin Disposable Pump (OMNIPOD CLASSIC PODS, GEN 3,) MISC Use for insulin delivery and change every 48 hours   Insulin Pen Needle 32G X 8 MM MISC Use as directed   Lancets (FREESTYLE) lancets Use as instructed   levothyroxine (SYNTHROID) 150 MCG tablet Take 1 tablet (150 mcg total) by mouth daily.   liraglutide (VICTOZA) 18 MG/3ML SOPN Inject 0.3 mLs (1.8 mg  total) into the skin daily.   lisinopril (ZESTRIL) 5 MG tablet Take 5 mg by mouth daily.   meclizine (ANTIVERT) 25 MG tablet Take 1 tablet (25 mg total) by mouth every 8 (eight) hours as needed for dizziness.   midodrine (PROAMATINE) 5 MG tablet Take 5 mg by mouth 3 (three) times daily.   Multiple Vitamin (MULTI-VITAMINS) TABS Take by mouth.   polyethylene glycol (MIRALAX / GLYCOLAX) packet Take 17 g by mouth daily as needed for mild constipation.   tamsulosin (FLOMAX) 0.4 MG CAPS capsule Take by mouth.   tiZANidine (ZANAFLEX) 4 MG tablet Take 4 mg by mouth 2 (two) times daily.   Vitamin D, Ergocalciferol, (DRISDOL) 1.25 MG (50000 UNIT) CAPS capsule Take 50,000 Units by mouth once a week.   zinc gluconate 50 MG tablet Take by mouth.   No current facility-administered medications for this visit.  (Other)      REVIEW OF SYSTEMS: ROS   Negative for: Constitutional, Gastrointestinal, Neurological, Skin, Genitourinary, Musculoskeletal, HENT, Endocrine, Cardiovascular, Eyes, Respiratory, Psychiatric, Allergic/Imm, Heme/Lymph Last edited by Silvestre Moment on 01/12/2022 10:30 AM.       ALLERGIES No Known Allergies  PAST MEDICAL HISTORY Past Medical History:  Diagnosis Date   Arm vein blood clot, left    on Coumadin   Arthritis    Cancer ([place])    Bone cancer 1987 (in knee Large cell tumor)   Chronic kidney disease    stage 3   Depression    Essential hypertension    Family history of adverse reaction to anesthesia    " my mother woke up during a cardiac surgery "   History of stroke    Hyperlipidemia    Hypothyroidism    Insomnia    Obesity    Precordial pain June 2011   Nuclear stress; no ischemia; EF 60%   Presence of permanent cardiac pacemaker    Proteinuria    Seizure disorder ([place])    Sick sinus syndrome ([place])    Sleep apnea    Dr. Brandon Melnick, uses bipap   Syncope    Type 2 diabetes mellitus ([place])    Venous insufficiency    Past Surgical History:  Procedure Laterality Date   AMPUTATION Right 06/20/2018   Procedure: RIGHT GREAT TOE AMPUTATION;  Surgeon: Newt Minion, MD;  Location: [facility];  Service: Orthopedics;  Laterality: Right;   AMPUTATION Right 09/26/2018   Procedure: RIGHT FOOT 5TH RAY AMPUTATION;  Surgeon: Newt Minion, MD;  Location: [facility];  Service: Orthopedics;  Laterality: Right;  MAC and regional anesthesia   AMPUTATION Right 10/05/2018   Procedure: RIGHT BELOW KNEE AMPUTATION;  Surgeon: Newt Minion, MD;  Location: [facility];  Service: Orthopedics;  Laterality: Right;   AMPUTATION Right 10/26/2018   Procedure: RIGHT ABOVE KNEE AMPUTATION;  Surgeon: Newt Minion, MD;  Location: [facility];  Service: Orthopedics;  Laterality: Right;   COLONOSCOPY     RESECTION BONE TUMOR FEMUR  1980's   Left femur, treated at [facility] with bone graft   STUMP REVISION Right  11/09/2018   Procedure: REVISION RIGHT ABOVE KNEE AMPUTATION;  Surgeon: Newt Minion, MD;  Location: [facility];  Service: Orthopedics;  Laterality: Right;   TOTAL KNEE ARTHROPLASTY Left 2013    FAMILY HISTORY Family History  Problem Relation Age of Onset   Heart attack Mother 28   Breast cancer Mother    Stroke Mother    Heart attack Father 70   Breast cancer Sister  Colon cancer Sister     SOCIAL HISTORY Social History   Tobacco Use   Smoking status: Former    Types: Cigarettes   Smokeless tobacco: Never   Tobacco comments:    11/27/14 "quit smoking years ago"  Vaping Use   Vaping Use: Never used  Substance Use Topics   Alcohol use: No    Alcohol/week: 0.0 standard drinks of alcohol   Drug use: No         OPHTHALMIC EXAM:  Base Eye Exam     Visual Acuity (ETDRS)       Right Left   Dist [place] 20/25 -1 20/40 -1   Dist ph [place]  20/25 -2         Tonometry (Tonopen, 10:39 AM)       Right Left   Pressure 9 10         Pupils       Pupils APD   Right PERRL None   Left PERRL None         Visual Fields       Left Right    Full Full         Extraocular Movement       Right Left    Full Full         Neuro/Psych     Oriented x3: Yes   Mood/Affect: Normal         Dilation     Both eyes: 1.0% Mydriacyl, 2.5% Phenylephrine @ 10:39 AM           Slit Lamp and Fundus Exam     External Exam       Right Left   External Normal Normal         Slit Lamp Exam       Right Left   Lids/Lashes Normal Normal   Conjunctiva/Sclera White and quiet White and quiet   Cornea Clear Clear   Anterior Chamber Deep and quiet Deep and quiet   Iris Round and reactive Round and reactive   Lens 2+ Nuclear sclerosis 2+ Nuclear sclerosis   Anterior Vitreous Normal Normal         Fundus Exam       Right Left   Posterior Vitreous Clear, vitrectomized Central vitreous floaters, Vitreous hemorrhage and preretinal over posterior pole, vitreous  hemorrhage inferiorly.   Disc Normal Old and active neovascularization smaller than standard photo 10-A   C/D Ratio 0.1 0.15   Macula Focal laser scars Focal laser scars   Vessels PDR-quiet PDR-active, NVD   Periphery Laser scars, good PRP clear media Laser scars, good PRP clear media, room superiorly for completion of PRP            IMAGING AND PROCEDURES  Imaging and Procedures for 01/12/22  OCT, Retina - OU - Both Eyes       Right Eye Quality was good. Scan locations included subfoveal. Central Foveal Thickness: 258. Progression has been stable. Findings include normal observations.   Left Eye Quality was good. Scan locations included subfoveal. Central Foveal Thickness: 256. Progression has been stable. Findings include normal observations.   Notes No active diabetic maculopathy with proliferative diabetic retinopathy inactive      Color Fundus Photography Optos - OU - Both Eyes       Right Eye Progression has been stable. Disc findings include normal observations. Macula : microaneurysms.   Left Eye Progression has been stable. Disc findings include normal observations, neovascularization. Macula : microaneurysms.  Notes OU with PDR now quiescent.  PRP OU.  No active CSME will observe      Panretinal Photocoagulation - OS - Left Eye       Time Out Confirmed correct patient, procedure, site, and patient consented.   Anesthesia Topical anesthesia was used. Anesthetic medications included Proparacaine 0.5%.   Laser Information The type of laser was diode. Color was yellow. The duration in seconds was 0.02. The spot size was 390 microns. Laser power was 280. Total spots was 766.   Post-op The patient tolerated the procedure well. There were no complications. The patient received written and verbal post procedure care education.   Notes Sector PRP completed superiorly OS              ASSESSMENT/PLAN:  Proliferative diabetic retinopathy of  left eye (HCC) Progressive PDR, despite PRP now with preretinal vitreous hemorrhage visible documented on color fundus photography.  Will need additional panretinal photocoagulation left eye today to in order to prevent progression  Proliferative diabetic retinopathy of right eye ([place]) Quiescent PDR OD  Vitreous hemorrhage of left eye (HCC) Increasing vitreous hemorrhage OS from PDR active     ICD-10-CM   1. Proliferative diabetic retinopathy of left eye associated with type 2 diabetes mellitus, macular edema presence unspecified (HCC)  Z66.2947 Panretinal Photocoagulation - OS - Left Eye    2. Vitreous hemorrhage of left eye (HCC)  H43.12 OCT, Retina - OU - Both Eyes    Color Fundus Photography Optos - OU - Both Eyes    3. Proliferative diabetic retinopathy of right eye without macular edema associated with type 2 diabetes mellitus ([place])  E11.3591       1.  OS with active PDR and increasing vitreous hemorrhage.  Additional PRP applied superiorly OS  2.  Vitreous hemorrhage does not clear or worsens left eye and may need vitrectomy left eye equivalent to the right eye in the past.  3.  Ophthalmic Meds Ordered this visit:  No orders of the defined types were placed in this encounter.      Return in about 3 weeks (around 02/02/2022) for dilate, OS, COLOR FP.  There are no Patient Instructions on file for this visit.   Explained the diagnoses, plan, and follow up with the patient and they expressed understanding.  Patient expressed understanding of the importance of proper follow up care.   Clent Demark Verleen Stuckey M.D. Diseases & Surgery of the Retina and Vitreous Retina & Diabetic [place] 01/12/22     Abbreviations: M myopia (nearsighted); A astigmatism; H hyperopia (farsighted); P presbyopia; Mrx spectacle prescription;  CTL contact lenses; OD right eye; OS left eye; OU both eyes  XT exotropia; ET esotropia; PEK punctate epithelial keratitis; PEE punctate epithelial erosions;  DES dry eye syndrome; MGD meibomian gland dysfunction; ATs artificial tears; PFAT's preservative free artificial tears; [place] nuclear sclerotic cataract; PSC posterior subcapsular cataract; ERM epi-retinal membrane; PVD posterior vitreous detachment; RD retinal detachment; DM diabetes mellitus; DR diabetic retinopathy; NPDR non-proliferative diabetic retinopathy; PDR proliferative diabetic retinopathy; CSME clinically significant macular edema; DME diabetic macular edema; dbh dot blot hemorrhages; CWS cotton wool spot; POAG primary open angle glaucoma; C/D cup-to-disc ratio; HVF humphrey visual field; GVF goldmann visual field; OCT optical coherence tomography; IOP intraocular pressure; BRVO Branch retinal vein occlusion; CRVO central retinal vein occlusion; CRAO central retinal artery occlusion; BRAO branch retinal artery occlusion; RT retinal tear; SB scleral buckle; PPV pars plana vitrectomy; VH Vitreous hemorrhage; PRP panretinal laser photocoagulation; IVK intravitreal  kenalog; VMT vitreomacular traction; MH Macular hole;  NVD neovascularization of the disc; NVE neovascularization elsewhere; AREDS age related eye disease study; ARMD age related macular degeneration; POAG primary open angle glaucoma; EBMD epithelial/anterior basement membrane dystrophy; ACIOL anterior chamber intraocular lens; IOL intraocular lens; PCIOL posterior chamber intraocular lens; Phaco/IOL phacoemulsification with intraocular lens placement; [place] photorefractive keratectomy; LASIK laser assisted in situ keratomileusis; HTN hypertension; DM diabetes mellitus; COPD chronic obstructive pulmonary disease

## 2022-02-01 ENCOUNTER — Ambulatory Visit: Payer: Medicaid Other | Admitting: Endocrinology

## 2022-02-03 ENCOUNTER — Encounter (INDEPENDENT_AMBULATORY_CARE_PROVIDER_SITE_OTHER): Payer: Medicaid Other | Admitting: Ophthalmology

## 2022-02-10 ENCOUNTER — Ambulatory Visit (INDEPENDENT_AMBULATORY_CARE_PROVIDER_SITE_OTHER): Payer: Medicaid Other | Admitting: Ophthalmology

## 2022-02-10 ENCOUNTER — Encounter (INDEPENDENT_AMBULATORY_CARE_PROVIDER_SITE_OTHER): Payer: Self-pay | Admitting: Ophthalmology

## 2022-02-10 DIAGNOSIS — E113592 Type 2 diabetes mellitus with proliferative diabetic retinopathy without macular edema, left eye: Secondary | ICD-10-CM

## 2022-02-10 DIAGNOSIS — H2513 Age-related nuclear cataract, bilateral: Secondary | ICD-10-CM

## 2022-02-10 DIAGNOSIS — H4312 Vitreous hemorrhage, left eye: Secondary | ICD-10-CM

## 2022-02-10 DIAGNOSIS — E113591 Type 2 diabetes mellitus with proliferative diabetic retinopathy without macular edema, right eye: Secondary | ICD-10-CM

## 2022-02-10 NOTE — Assessment & Plan Note (Signed)
No active PDR OD.

## 2022-02-10 NOTE — Assessment & Plan Note (Signed)
OS clearing much less active

## 2022-02-10 NOTE — Assessment & Plan Note (Signed)
Improving OS post recent PRP

## 2022-02-10 NOTE — Progress Notes (Signed)
02/10/2022     CHIEF COMPLAINT Patient presents for  Chief Complaint  Patient presents with   Diabetic Retinopathy without Macular Edema      HISTORY OF PRESENT ILLNESS: Ethan Campbell is a 61 y.o. male who presents to the clinic today for:   HPI   4 weeks for DILATE, OS, COLOR FP.  Post recent PRP OS for PDR progression with vitreous hemorrhage Pt stated vision has been stable since last visit.  Last edited by Hurman Horn, MD on 02/10/2022 10:21 AM.      Referring physician: Monico Blitz, MD [place],  [place] [id number]  HISTORICAL INFORMATION:   Selected notes from the MEDICAL RECORD NUMBER    Lab Results  Component Value Date   HGBA1C 13.1 (A) 09/01/2021     CURRENT MEDICATIONS: No current outpatient medications on file. (Ophthalmic Drugs)   No current facility-administered medications for this visit. (Ophthalmic Drugs)   Current Outpatient Medications (Other)  Medication Sig   aspirin EC 81 MG EC tablet Take 1 tablet (81 mg total) by mouth 2 (two) times a day.   atorvastatin (LIPITOR) 80 MG tablet Take 1 tablet (80 mg total) by mouth daily.   blood glucose meter kit and supplies Dispense based on patient and insurance preference. Use up to four times daily as directed. (FOR ICD-10 E10.9, E11.9).   carvedilol (COREG) 6.25 MG tablet Take 1 tablet (6.25 mg total) by mouth 2 (two) times daily with a meal.   Continuous Blood Gluc Receiver (DEXCOM G6 RECEIVER) DEVI Check blood sugar daily   Continuous Blood Gluc Receiver (FREESTYLE LIBRE 2 READER) DEVI Use to check blood sugar daily   Continuous Blood Gluc Sensor (DEXCOM G6 SENSOR) MISC Change every 10 days   Continuous Blood Gluc Sensor (FREESTYLE LIBRE 2 SENSOR) MISC Change every 14 days   Continuous Blood Gluc Transmit (DEXCOM G6 TRANSMITTER) MISC Change every 3 months   Dasiglucagon HCl (ZEGALOGUE) 0.6 MG/0.6ML SOAJ Inject 0.6 mLs into the skin as needed. To be injected when unable to treat low blood  sugars by mouth   DULoxetine (CYMBALTA) 60 MG capsule Take 1 capsule (60 mg total) by mouth daily.   ezetimibe (ZETIA) 10 MG tablet TAKE (1) TABLET BY MOUTH ONCE DAILY.   fenofibrate (TRICOR) 145 MG tablet Take 1 tablet (145 mg total) by mouth daily.   furosemide (LASIX) 40 MG tablet Take 40 mg by mouth. Take two tablets once daily   gabapentin (NEURONTIN) 300 MG capsule Take 300 mg by mouth 3 (three) times daily. Take 325m by mouth three times daily.   glucose blood (ACCU-CHEK AVIVA PLUS) test strip TEST UP TO 4 TIMES DAILY Dx code E11.65   HUMULIN R 500 UNIT/ML injection USE AS DIRECTED, INJECT UP TO 35 UNITS 3 TIMES A DAY.   Insulin Disposable Pump (OMNIPOD CLASSIC PODS, GEN 3,) MISC Use for insulin delivery and change every 48 hours   Insulin Pen Needle 32G X 8 MM MISC Use as directed   Lancets (FREESTYLE) lancets Use as instructed   levothyroxine (SYNTHROID) 150 MCG tablet Take 1 tablet (150 mcg total) by mouth daily.   liraglutide (VICTOZA) 18 MG/3ML SOPN Inject 0.3 mLs (1.8 mg total) into the skin daily.   lisinopril (ZESTRIL) 5 MG tablet Take 5 mg by mouth daily.   meclizine (ANTIVERT) 25 MG tablet Take 1 tablet (25 mg total) by mouth every 8 (eight) hours as needed for dizziness.   midodrine (PROAMATINE) 5 MG  tablet Take 5 mg by mouth 3 (three) times daily.   Multiple Vitamin (MULTI-VITAMINS) TABS Take by mouth.   polyethylene glycol (MIRALAX / GLYCOLAX) packet Take 17 g by mouth daily as needed for mild constipation.   tamsulosin (FLOMAX) 0.4 MG CAPS capsule Take by mouth.   tiZANidine (ZANAFLEX) 4 MG tablet Take 4 mg by mouth 2 (two) times daily.   Vitamin D, Ergocalciferol, (DRISDOL) 1.25 MG (50000 UNIT) CAPS capsule Take 50,000 Units by mouth once a week.   zinc gluconate 50 MG tablet Take by mouth.   No current facility-administered medications for this visit. (Other)      REVIEW OF SYSTEMS: ROS   Negative for: Constitutional, Gastrointestinal, Neurological, Skin,  Genitourinary, Musculoskeletal, HENT, Endocrine, Cardiovascular, Eyes, Respiratory, Psychiatric, Allergic/Imm, Heme/Lymph Last edited by Silvestre Moment on 02/10/2022  9:31 AM.       ALLERGIES No Known Allergies  PAST MEDICAL HISTORY Past Medical History:  Diagnosis Date   Arm vein blood clot, left    on Coumadin   Arthritis    Cancer ([place])    Bone cancer 1987 (in knee Large cell tumor)   Chronic kidney disease    stage 3   Depression    Essential hypertension    Family history of adverse reaction to anesthesia    " my mother woke up during a cardiac surgery "   History of stroke    Hyperlipidemia    Hypothyroidism    Insomnia    Obesity    Precordial pain June 2011   Nuclear stress; no ischemia; EF 60%   Presence of permanent cardiac pacemaker    Proteinuria    Seizure disorder ([place])    Sick sinus syndrome ([place])    Sleep apnea    Dr. Brandon Melnick, uses bipap   Syncope    Type 2 diabetes mellitus ([place])    Venous insufficiency    Past Surgical History:  Procedure Laterality Date   AMPUTATION Right 06/20/2018   Procedure: RIGHT GREAT TOE AMPUTATION;  Surgeon: Newt Minion, MD;  Location: [facility];  Service: Orthopedics;  Laterality: Right;   AMPUTATION Right 09/26/2018   Procedure: RIGHT FOOT 5TH RAY AMPUTATION;  Surgeon: Newt Minion, MD;  Location: [facility];  Service: Orthopedics;  Laterality: Right;  MAC and regional anesthesia   AMPUTATION Right 10/05/2018   Procedure: RIGHT BELOW KNEE AMPUTATION;  Surgeon: Newt Minion, MD;  Location: [facility];  Service: Orthopedics;  Laterality: Right;   AMPUTATION Right 10/26/2018   Procedure: RIGHT ABOVE KNEE AMPUTATION;  Surgeon: Newt Minion, MD;  Location: [facility];  Service: Orthopedics;  Laterality: Right;   COLONOSCOPY     RESECTION BONE TUMOR FEMUR  1980's   Left femur, treated at [facility] with bone graft   STUMP REVISION Right 11/09/2018   Procedure: REVISION RIGHT ABOVE KNEE AMPUTATION;  Surgeon: Newt Minion, MD;  Location: [facility];   Service: Orthopedics;  Laterality: Right;   TOTAL KNEE ARTHROPLASTY Left 2013    FAMILY HISTORY Family History  Problem Relation Age of Onset   Heart attack Mother 60   Breast cancer Mother    Stroke Mother    Heart attack Father 37   Breast cancer Sister    Colon cancer Sister     SOCIAL HISTORY Social History   Tobacco Use   Smoking status: Former    Types: Cigarettes   Smokeless tobacco: Never   Tobacco comments:    11/27/14 "quit smoking years ago"  43  Use   Vaping Use: Never used  Substance Use Topics   Alcohol use: No    Alcohol/week: 0.0 standard drinks of alcohol   Drug use: No         OPHTHALMIC EXAM:  Base Eye Exam     Visual Acuity (ETDRS)       Right Left   Dist [place] 20/40 -1 20/60   Dist ph [place] 20/20 -2 20/30         Tonometry (Tonopen, 9:36 AM)       Right Left   Pressure 10 10         Pupils       Pupils APD   Right PERRL None   Left PERRL None         Visual Fields       Left Right    Full Full         Extraocular Movement       Right Left    Full Full         Neuro/Psych     Oriented x3: Yes   Mood/Affect: Normal         Dilation     Left eye: 2.5% Phenylephrine, 1.0% Mydriacyl @ 9:36 AM           Slit Lamp and Fundus Exam     External Exam       Right Left   External Normal Normal         Slit Lamp Exam       Right Left   Lids/Lashes Normal Normal   Conjunctiva/Sclera White and quiet White and quiet   Cornea Clear Clear   Anterior Chamber Deep and quiet Deep and quiet   Iris Round and reactive Round and reactive   Lens 2+ Nuclear sclerosis 2+ Nuclear sclerosis   Anterior Vitreous Normal Normal         Fundus Exam       Right Left   Posterior Vitreous Clear, vitrectomized Central vitreous floaters, Vitreous hemorrhage and preretinal over posterior pole, vitreous hemorrhage inferiorly.   Disc Normal Old and active neovascularization smaller than standard photo 10-A   C/D Ratio  0.1 0.15   Macula Focal laser scars Focal laser scars   Vessels PDR-quiet PDR-less active, NVE, smaller   Periphery Laser scars, good PRP clear media Laser scars, good PRP clear media, room superiorly for completion of PRP            IMAGING AND PROCEDURES  Imaging and Procedures for 02/10/22  Color Fundus Photography Optos - OU - Both Eyes       Right Eye Progression has been stable. Disc findings include normal observations. Macula : microaneurysms.   Left Eye Progression has been stable. Disc findings include neovascularization. Macula : microaneurysms.   Notes OD with PDR now quiescent.  PRP OU.  No active CSME will observe, vitreous hemorrhage OS inferiorly clearing Additional PRP OS now present              ASSESSMENT/PLAN:  Nuclear sclerotic cataract of both eyes Needs cataract surgery OU.  We will send a [facility] for Dr. Gerald Stabs or Dr. Nicki Reaper  Proliferative diabetic retinopathy of left eye ([place]) Improving OS post recent PRP  Proliferative diabetic retinopathy of right eye ([place]) No active PDR OD.  Vitreous hemorrhage of left eye (HCC) OS clearing much less active     ICD-10-CM   1. Proliferative diabetic retinopathy of left eye associated with type  2 diabetes mellitus, macular edema presence unspecified (HCC)  M22.6333 Color Fundus Photography Optos - OU - Both Eyes    2. Nuclear sclerotic cataract of both eyes  H25.13     3. Proliferative diabetic retinopathy of right eye without macular edema associated with type 2 diabetes mellitus ([place])  E11.3591     4. Vitreous hemorrhage of left eye (HCC)  H43.12       1.  OS vitreous hemorrhage clearing nicely.  2.  PDR much less active.  OS.  OD quiescent  3.  Will need cataract surgery in left eye and right eye.  Will refer to [facility] eye care for evaluation  4.  Completion of PRP next OS with color photo  Ophthalmic Meds Ordered this visit:  No orders of the defined types were placed in this  encounter.      Return in about 7 weeks (around 03/31/2022) for DILATE OU, PRP, OS.  There are no Patient Instructions on file for this visit.   Explained the diagnoses, plan, and follow up with the patient and they expressed understanding.  Patient expressed understanding of the importance of proper follow up care.   Clent Demark Maia Handa M.D. Diseases & Surgery of the Retina and Vitreous Retina & Diabetic [place] 02/10/22     Abbreviations: M myopia (nearsighted); A astigmatism; H hyperopia (farsighted); P presbyopia; Mrx spectacle prescription;  CTL contact lenses; OD right eye; OS left eye; OU both eyes  XT exotropia; ET esotropia; PEK punctate epithelial keratitis; PEE punctate epithelial erosions; DES dry eye syndrome; MGD meibomian gland dysfunction; ATs artificial tears; PFAT's preservative free artificial tears; [place] nuclear sclerotic cataract; PSC posterior subcapsular cataract; ERM epi-retinal membrane; PVD posterior vitreous detachment; RD retinal detachment; DM diabetes mellitus; DR diabetic retinopathy; NPDR non-proliferative diabetic retinopathy; PDR proliferative diabetic retinopathy; CSME clinically significant macular edema; DME diabetic macular edema; dbh dot blot hemorrhages; CWS cotton wool spot; POAG primary open angle glaucoma; C/D cup-to-disc ratio; HVF humphrey visual field; GVF goldmann visual field; OCT optical coherence tomography; IOP intraocular pressure; BRVO Branch retinal vein occlusion; CRVO central retinal vein occlusion; CRAO central retinal artery occlusion; BRAO branch retinal artery occlusion; RT retinal tear; SB scleral buckle; PPV pars plana vitrectomy; VH Vitreous hemorrhage; PRP panretinal laser photocoagulation; IVK intravitreal kenalog; VMT vitreomacular traction; MH Macular hole;  NVD neovascularization of the disc; NVE neovascularization elsewhere; AREDS age related eye disease study; ARMD age related macular degeneration; POAG primary open angle glaucoma;  EBMD epithelial/anterior basement membrane dystrophy; ACIOL anterior chamber intraocular lens; IOL intraocular lens; PCIOL posterior chamber intraocular lens; Phaco/IOL phacoemulsification with intraocular lens placement; [place] photorefractive keratectomy; LASIK laser assisted in situ keratomileusis; HTN hypertension; DM diabetes mellitus; COPD chronic obstructive pulmonary disease

## 2022-02-10 NOTE — Assessment & Plan Note (Signed)
Needs cataract surgery OU.  We will send a [facility] for Dr. Gerald Stabs or Dr. Nicki Reaper

## 2022-02-21 ENCOUNTER — Other Ambulatory Visit: Payer: Self-pay | Admitting: Endocrinology

## 2022-02-21 DIAGNOSIS — E782 Mixed hyperlipidemia: Secondary | ICD-10-CM

## 2022-02-23 ENCOUNTER — Ambulatory Visit (INDEPENDENT_AMBULATORY_CARE_PROVIDER_SITE_OTHER): Payer: Medicaid Other | Admitting: Endocrinology

## 2022-02-23 ENCOUNTER — Encounter: Payer: Self-pay | Admitting: Endocrinology

## 2022-02-23 ENCOUNTER — Other Ambulatory Visit: Payer: Self-pay | Admitting: Endocrinology

## 2022-02-23 VITALS — BP 174/76 | HR 70 | Ht 69.0 in | Wt 313.4 lb

## 2022-02-23 DIAGNOSIS — Z794 Long term (current) use of insulin: Secondary | ICD-10-CM | POA: Diagnosis not present

## 2022-02-23 DIAGNOSIS — E039 Hypothyroidism, unspecified: Secondary | ICD-10-CM | POA: Diagnosis not present

## 2022-02-23 DIAGNOSIS — E782 Mixed hyperlipidemia: Secondary | ICD-10-CM

## 2022-02-23 DIAGNOSIS — E1165 Type 2 diabetes mellitus with hyperglycemia: Secondary | ICD-10-CM

## 2022-02-23 LAB — T4, FREE: Free T4: 1.17 ng/dL (ref 0.60–1.60)

## 2022-02-23 LAB — COMPREHENSIVE METABOLIC PANEL
ALT: 55 U/L — ABNORMAL HIGH (ref 0–53)
AST: 53 U/L — ABNORMAL HIGH (ref 0–37)
Albumin: 3.8 g/dL (ref 3.5–5.2)
Alkaline Phosphatase: 56 U/L (ref 39–117)
BUN: 73 mg/dL — ABNORMAL HIGH (ref 6–23)
CO2: 26 mEq/L (ref 19–32)
Calcium: 9.4 mg/dL (ref 8.4–10.5)
Chloride: 103 mEq/L (ref 96–112)
Creatinine, Ser: 3.35 mg/dL — ABNORMAL HIGH (ref 0.40–1.50)
GFR: 19.12 mL/min — ABNORMAL LOW (ref >60.00)
Glucose, Bld: 45 mg/dL — CL (ref 70–99)
Potassium: 4.8 mEq/L (ref 3.5–5.1)
Sodium: 138 mEq/L (ref 135–145)
Total Bilirubin: 0.4 mg/dL (ref 0.2–1.2)
Total Protein: 7.6 g/dL (ref 6.0–8.3)

## 2022-02-23 LAB — LIPID PANEL
Cholesterol: 93 mg/dL (ref 0–200)
HDL: 29.5 mg/dL — ABNORMAL LOW (ref >39.00)
LDL Cholesterol: 52 mg/dL (ref 0–99)
NonHDL: 63.92
Total CHOL/HDL Ratio: 3
Triglycerides: 60 mg/dL (ref 0.0–149.0)
VLDL: 12 mg/dL (ref 0.0–40.0)

## 2022-02-23 LAB — POCT GLYCOSYLATED HEMOGLOBIN (HGB A1C): Hemoglobin A1C: 6.8 % — AB (ref 4.0–5.6)

## 2022-02-23 LAB — TSH: TSH: 2.54 u[IU]/mL (ref 0.35–5.50)

## 2022-02-23 MED ORDER — DEXCOM G7 SENSOR MISC: 1.0000 | 3 refills | Status: DC

## 2022-02-23 MED ORDER — DEXCOM G7 RECEIVER DEVI: 0 refills | Status: DC

## 2022-02-23 NOTE — Progress Notes (Signed)
Patient ID: Ethan Campbell, male   DOB: 1960-09-11, 61 y.o.   MRN: [id number]   Reason for Appointment: follow-up   History of Present Illness   Diagnosis: Type 2 DIABETES MELITUS, date of diagnosis: 2000    Previous history: he has been on insulin for several years with consistently poor control Has been requiring large doses of insulin for his diabetes but A1c has been persistently high Blood sugars did not improve significantly even with trying Byetta and Victoza In 2014 he was switched from NovoLog to U-500 insulin but not clear if he has had improvement in control except with fasting readings His prior A1c was 14.0 in 5/14 and he had educational discussions with diabetes educator and dietitian in 6/14   He was started on the OMNIPOD pump on 02/06/2018  Recent history:  Non-insulin hypoglycemic drugs: Victoza 1.8 mg daily  Insulin: Humulin R U-500   Omnipod PUMP settings:   Basal rate settings 12 AM = 0.45, 2 AM = 0.35, 8 AM = 0.80,  and 4 PM = 1.20  Preset boluses: 4-7 units with IC ratio 10 Blood sugar target 150 between 12 AM-7 AM and then 130 Sensitivity 1: 50, active insulin 6 hours  His A1c is dramatically better at 6.8 compared to 13.1  Current management, blood sugar patterns and problems identified:  He was finally able to get the continuous glucose sensor and has used the Dexcom in the last month  He has not followed up since March when his program on the pump was off by 12 hours on the time setting  His blood sugars are likely improved because of his using CGM and being more conscious of his blood sugars  However still has periodic hypoglycemia Most of his high sugars are related to late boluses He appears to be taking boluses only after the blood sugar goes up after eating frequently Also has some missed boluses on several days Currently total insulin is only about 18 units a day on his basal and appears to have used his pump consistently Current  bolus amount is only averaging 4.5 units/day  He says he is eating more salads but does appear to be having carbohydrate with most of his meals Unable to weigh him today  He does take his Victoza regularly   Mealtimes: 8 am 12 pm, 6 pm  Proper timing of medications in relation to meals: Yes, usually 30 minutes before eating .          Interpretation of the CGM from [facility] as follows The data on his reader has been set for November 2 instead of August 2 Overall on average his blood sugars are within the target range at all times HIGHEST readings are around 3 PM and 10 PM averaging close to 180 However he has significant hyperglycemic episodes especially in week #1 after his first meal in the early afternoon and periodically in the evening Also hypoglycemic episodes in the last week tend to persist into the early part of the night HYPOGLYCEMIA has occurred variably overnight, more so in the first week and in the last week only mild tendency to hypoglycemia after 4 AM POSTPRANDIAL readings as above variable movement higher readings after his first meal in the first week and only 2 or 3 episodes of hypoglycemia after dinner in the evening in the last week  CGM use % of time 93  2-week average/SD 137/55  Time in range 71  % Time Above 180 17+3  %  Time above 250   % Time Below 70 3     Previously: Previous readings from Accu-Chek Glucometer: Accu-Chek  Blood sugar readings by review of his monitor:    PRE-MEAL Fasting Lunch Dinner Bedtime Overall  Glucose range: 32-352 30, 62 37-228 83   Mean/median:     119     Usually eating 2 meals a day with blood sugars improving after his first meal but not to normal Glucose data incomplete after evening meal but likely somewhat better with pre-meal blood sugar around 280-290   Physical activity: exercise:none   Last dietitian visit: 6/18 and last CDE visit in 4/16  Wt Readings from Last 3 Encounters:  02/23/22 (!) 313 lb 6.4 oz  (142.2 kg)  03/22/21 (!) 332 lb (150.6 kg)  02/08/21 (!) 336 lb 3.2 oz (152.5 kg)    LABS:   Lab Results  Component Value Date   HGBA1C 6.8 (A) 02/23/2022   HGBA1C 13.1 (A) 09/01/2021   HGBA1C 8.1 (A) 05/24/2021   Lab Results  Component Value Date   MICROALBUR 16.7 (H) 08/19/2019   LDLCALC 52 02/23/2022   CREATININE 3.35 (H) 02/23/2022    Lab Results  Component Value Date   FRUCTOSAMINE 280 03/09/2020   FRUCTOSAMINE 341 (H) 08/19/2019   FRUCTOSAMINE 333 (H) 04/22/2019    OTHER active problems are discussed in review of systems   Allergies as of 02/23/2022   No Known Allergies      Medication List        Accurate as of February 23, 2022  9:24 PM. If you have any questions, ask your nurse or doctor.          aspirin EC 81 MG tablet Take 1 tablet (81 mg total) by mouth 2 (two) times a day.   atorvastatin 80 MG tablet Commonly known as: LIPITOR Take 1 tablet (80 mg total) by mouth daily.   blood glucose meter kit and supplies Dispense based on patient and insurance preference. Use up to four times daily as directed. (FOR ICD-10 E10.9, E11.9).   carvedilol 6.25 MG tablet Commonly known as: COREG Take 1 tablet (6.25 mg total) by mouth 2 (two) times daily with a meal.   Dexcom G6 Sensor Misc Change every 10 days What changed: Another medication with the same name was changed. Make sure you understand how and when to take each. Changed by: Ajay Kumar, MD   Dexcom G7 Sensor Misc 1 Device by Does not apply route as directed. Change sensor every 10 days What changed:  how much to take how to take this when to take this additional instructions Changed by: Ajay Kumar, MD   Dexcom G6 Transmitter Misc Change every 3 months   DULoxetine 60 MG capsule Commonly known as: CYMBALTA Take 1 capsule (60 mg total) by mouth daily.   ezetimibe 10 MG tablet Commonly known as: ZETIA TAKE (1) TABLET BY MOUTH ONCE DAILY.   fenofibrate 145 MG tablet Commonly known as:  TRICOR Take 1 tablet (145 mg total) by mouth daily.   freestyle lancets Use as instructed   FreeStyle Libre 2 Reader Devi Use to check blood sugar daily What changed: Another medication with the same name was added. Make sure you understand how and when to take each. Changed by: Ajay Kumar, MD   Dexcom G6 Receiver Devi Check blood sugar daily What changed: Another medication with the same name was added. Make sure you understand how and when to take each. Changed by: Ajay Kumar,   MD   Dexcom G7 Receiver Devi To display CGM data What changed: You were already taking a medication with the same name, and this prescription was added. Make sure you understand how and when to take each. Changed by: Ajay Kumar, MD   furosemide 40 MG tablet Commonly known as: LASIX Take 40 mg by mouth. Take two tablets once daily   gabapentin 300 MG capsule Commonly known as: NEURONTIN Take 300 mg by mouth 3 (three) times daily. Take 300mg by mouth three times daily.   glucose blood test strip Commonly known as: Accu-Chek Aviva Plus TEST UP TO 4 TIMES DAILY Dx code E11.65   HUMULIN R 500 UNIT/ML injection Generic drug: insulin regular human CONCENTRATED USE AS DIRECTED, INJECT UP TO 35 UNITS 3 TIMES A DAY.   Insulin Pen Needle 32G X 8 MM Misc Use as directed   levothyroxine 150 MCG tablet Commonly known as: SYNTHROID Take 1 tablet (150 mcg total) by mouth daily.   lisinopril 5 MG tablet Commonly known as: ZESTRIL Take 5 mg by mouth daily.   meclizine 25 MG tablet Commonly known as: ANTIVERT Take 1 tablet (25 mg total) by mouth every 8 (eight) hours as needed for dizziness.   midodrine 5 MG tablet Commonly known as: PROAMATINE Take 5 mg by mouth 3 (three) times daily.   Multi-Vitamins Tabs Take by mouth.   Omnipod Classic Pods (Gen 3) Misc Use for insulin delivery and change every 48 hours   polyethylene glycol 17 g packet Commonly known as: MIRALAX / GLYCOLAX Take 17 g by mouth  daily as needed for mild constipation.   tamsulosin 0.4 MG Caps capsule Commonly known as: FLOMAX Take by mouth.   tiZANidine 4 MG tablet Commonly known as: ZANAFLEX Take 4 mg by mouth 2 (two) times daily.   Victoza 18 MG/3ML Sopn Generic drug: liraglutide Inject 0.3 mLs (1.8 mg total) into the skin daily.   Vitamin D (Ergocalciferol) 1.25 MG (50000 UNIT) Caps capsule Commonly known as: DRISDOL Take 50,000 Units by mouth once a week.   [place] 0.6 MG/0.6ML Soaj Generic drug: Dasiglucagon HCl Inject 0.6 mLs into the skin as needed. To be injected when unable to treat low blood sugars by mouth   zinc gluconate 50 MG tablet Take by mouth.        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 Tesfaye, uses bipap   Syncope    Type 2 diabetes mellitus (HCC)    Venous insufficiency     Past Surgical History:  Procedure Laterality Date   AMPUTATION Right 06/20/2018   Procedure: RIGHT GREAT TOE AMPUTATION;  Surgeon: Duda, Marcus V, MD;    Location: [facility] OR;  Service: Orthopedics;  Laterality: Right;   AMPUTATION Right 09/26/2018   Procedure: RIGHT FOOT 5TH RAY AMPUTATION;  Surgeon: Duda, Marcus V, MD;  Location: [facility] OR;  Service: Orthopedics;  Laterality: Right;  MAC and regional anesthesia   AMPUTATION Right 10/05/2018   Procedure: RIGHT BELOW KNEE AMPUTATION;  Surgeon: Duda,  Marcus V, MD;  Location: [facility] OR;  Service: Orthopedics;  Laterality: Right;   AMPUTATION Right 10/26/2018   Procedure: RIGHT ABOVE KNEE AMPUTATION;  Surgeon: Duda, Marcus V, MD;  Location: [facility] OR;  Service: Orthopedics;  Laterality: Right;   COLONOSCOPY     RESECTION BONE TUMOR FEMUR  1980's   Left femur, treated at [facility] with bone graft   STUMP REVISION Right 11/09/2018   Procedure: REVISION RIGHT ABOVE KNEE AMPUTATION;  Surgeon: Duda, Marcus V, MD;  Location: [facility] OR;  Service: Orthopedics;  Laterality: Right;   TOTAL KNEE ARTHROPLASTY Left 2013    Family History  Problem Relation Age of Onset   Heart attack Mother 67   Breast cancer Mother    Stroke Mother    Heart attack Father 54   Breast cancer Sister    Colon cancer Sister     Social History:  reports that he has quit smoking. His smoking use included cigarettes. He has never used smokeless tobacco. He reports that he does not drink alcohol and does not use drugs.  Review of Systems:    HYPOTHYROIDISM:   on  levothyroxine 150 ug daily The dose was increased in 3/23 Has been taking the levothyroxine before breakfast  The dose was increased on his last visit   Lab Results  Component Value Date   TSH 2.54 02/23/2022   TSH 12.39 (H) 10/14/2021   TSH 3.30 02/10/2021   FREET4 1.17 02/23/2022   FREET4 1.01 10/14/2021   FREET4 0.95 03/09/2020     Lipids: triglycerides on labs on the last measurement are below 150 LDL has been controlled consistently He is on atorvastatin 80 mg, he thinks he is taking this regularly Because of his high LDL ezetimibe was added in 6/22  Followed by PCP   Lab Results  Component Value Date   CHOL 93 02/23/2022   HDL 29.50 (L) 02/23/2022   LDLCALC 52 02/23/2022   LDLDIRECT 80.0 03/07/2016   TRIG 60.0 02/23/2022   CHOLHDL 3 02/23/2022      CKD: His creatinine has been variable, seen periodically by nephrologist but not recently  Lab Results  Component Value Date   CREATININE 3.35 (H)  02/23/2022   CREATININE 2.52 (H) 10/14/2021   CREATININE 2.0 (A) 02/10/2021   Hypertension: Managed by nephrologist He is on 5 mg lisinopril, carvedilol  BP Readings from Last 3 Encounters:  02/23/22 (!) 174/76  10/14/21 118/70  09/01/21 118/76    History of CHF, taking Lasix using 40 mg tablet      Examination:   BP (!) 174/76   Pulse 70   Ht 5' 9" (1.753 m)   Wt (!) 313 lb 6.4 oz (142.2 kg)   SpO2 95%   BMI 46.28 kg/m   Body mass index is 46.28 kg/m.     ASSESSMENT/ PLAN:   Diabetes type 2 with obesity, using an insulin pump  See history of present illness for detailed discussion of current diabetes management, blood sugar patterns and problems identified A1c is 6.8 and dramatically better  He is staying on OMNIPOD insulin pump with U-500 insulin along with Victoza Now with using the   Dexcom and having reliable CGM he appears to be overall doing much better However as above he is not bolusing consistently or on time but diet may be better Also unclear if his renal function has changed   Plan:   He will continue using the Dexcom However his high alert will be set at 220 instead of 160 Today also have programmed the correct date on his CGM reader  Emphasized the need to bolus every time he is planning to eat and 30 minutes before eating He needs to take boluses regardless of Premeal blood sugar Only he can skip the bolus if he is eating only salads with no carbohydrates Basal rate will be adjusted as follows and he can do this on his own 12 AM = 0.3, 2 AM = 0.2, 8 AM = 0.9, 4 PM = 1 point 2 and 10 PM = 0.8  Hypothyroidism and CKD: To check thyroid levels and chemistry today  More regular follow-up needed  Total visit time for evaluation and management and counseling = 30 minutes   Patient Instructions  Bolus for all meals    Elayne Snare 02/23/2022, 9:24 PM   Note: This office note was prepared with Dragon voice recognition system technology. Any  transcriptional errors that result from this process are unintentional.  Addendum: Labs forwarded to PCP and also he needs to see nephrologist for high creatinine, glucose 45 likely to be from bolusing and not eating

## 2022-02-23 NOTE — Patient Instructions (Signed)
Bolus for all meals

## 2022-03-01 ENCOUNTER — Other Ambulatory Visit (HOSPITAL_COMMUNITY): Payer: Self-pay

## 2022-03-03 ENCOUNTER — Other Ambulatory Visit (HOSPITAL_COMMUNITY): Payer: Self-pay

## 2022-03-03 ENCOUNTER — Encounter: Payer: Self-pay | Admitting: Endocrinology

## 2022-03-07 ENCOUNTER — Telehealth: Payer: Self-pay

## 2022-03-07 NOTE — Telephone Encounter (Signed)
Pharmacy called states that PA is needed for Dexcom G7. Please start PA

## 2022-03-08 ENCOUNTER — Other Ambulatory Visit (HOSPITAL_COMMUNITY): Payer: Self-pay

## 2022-03-08 NOTE — Telephone Encounter (Signed)
Contacted pharmacy and informed them

## 2022-03-16 NOTE — Telephone Encounter (Signed)
Pharmacy called. Needs PA done for [facility] G7 receiver as well.

## 2022-03-17 ENCOUNTER — Telehealth: Payer: Self-pay

## 2022-03-17 ENCOUNTER — Other Ambulatory Visit (HOSPITAL_COMMUNITY): Payer: Self-pay

## 2022-03-17 NOTE — Telephone Encounter (Signed)
Patient Advocate Encounter   Received notification from [place] that prior authorization is required for [facility] G7 Receiver. PA submitted and APPROVED on 03/16/2022.  Confirmation [id number] Lauralee Evener, CPhT Rx Patient Advocate Specialist Phone: [phone]

## 2022-03-18 NOTE — Telephone Encounter (Signed)
I called and spoke with pharmacy and notified them of approval.

## 2022-03-31 ENCOUNTER — Encounter (INDEPENDENT_AMBULATORY_CARE_PROVIDER_SITE_OTHER): Payer: Medicaid Other | Admitting: Ophthalmology

## 2022-04-05 ENCOUNTER — Telehealth: Payer: Self-pay

## 2022-04-05 NOTE — Telephone Encounter (Signed)
[facility] called to see if we could just for today send Rx for u-500 pen. Patient is on u-500 vial for pump and pharmacy cannot get until tomorrow. I spoke with Dr Dwyane Dee states that values are different and Vaughan Basta stated settings will need to be changed. Insulin was sent to [facility] and pharmacy picked Rx up from there.

## 2022-05-05 ENCOUNTER — Other Ambulatory Visit: Payer: Self-pay | Admitting: Endocrinology

## 2022-05-06 ENCOUNTER — Telehealth: Payer: Self-pay

## 2022-05-06 ENCOUNTER — Other Ambulatory Visit (HOSPITAL_COMMUNITY): Payer: Self-pay

## 2022-05-06 NOTE — Telephone Encounter (Signed)
Patient Advocate Encounter  Received notification that prior authorization is required for [facility] G7 Sensor. PA submitted and APPROVED on 05/06/2022 via [employer].  Key [id number] Prior authorization # [id number] Effective: 05/06/2022 - 11/02/2022

## 2022-05-18 ENCOUNTER — Encounter: Payer: Self-pay | Admitting: Endocrinology

## 2022-05-18 ENCOUNTER — Ambulatory Visit (INDEPENDENT_AMBULATORY_CARE_PROVIDER_SITE_OTHER): Payer: Medicaid Other | Admitting: Endocrinology

## 2022-05-18 VITALS — BP 142/70 | HR 74 | Ht 69.0 in | Wt 302.0 lb

## 2022-05-18 DIAGNOSIS — E669 Obesity, unspecified: Secondary | ICD-10-CM

## 2022-05-18 DIAGNOSIS — E1169 Type 2 diabetes mellitus with other specified complication: Secondary | ICD-10-CM

## 2022-05-18 DIAGNOSIS — Z23 Encounter for immunization: Secondary | ICD-10-CM

## 2022-05-18 NOTE — Progress Notes (Signed)
Patient ID: Ethan Campbell, male   DOB: 10/31/1960, 61 y.o.   MRN: [id number]   Reason for Appointment: follow-up   History of Present Illness   Diagnosis: Type 2 DIABETES MELITUS, date of diagnosis: 2000    Previous history: he has been on insulin for several years with consistently poor control Has been requiring large doses of insulin for his diabetes but A1c has been persistently high Blood sugars did not improve significantly even with trying Byetta and Victoza In 2014 he was switched from NovoLog to U-500 insulin but not clear if he has had improvement in control except with fasting readings His prior A1c was 14.0 in 5/14 and he had educational discussions with diabetes educator and dietitian in 6/14   He was started on the OMNIPOD pump on 02/06/2018  Recent history:  Non-insulin hypoglycemic drugs: Victoza 1.8 mg daily  Insulin: Humulin R U-500   Omnipod PUMP settings:   Basal rate settings 12 AM = 0.45, 2 AM = 0.35, 8 AM = 0.80,  and 4 PM = 1.20  Preset boluses: 4-7 units with IC ratio 10 Blood sugar target 150 between 12 AM-7 AM and then 130 Sensitivity 1: 50, active insulin 6 hours  His A1c is dramatically better at 6.8 compared to 13.1  Current management, blood sugar patterns and problems identified:  He has used the Dexcom G7 now in the last month Appears that he has not set up the time properly on his download today shows data from both the old and new sensors and not clear what his patterns are However on his reader his time in range is only about 42% Unclear what patterns he has but he reports higher readings in the evenings and tendency to low readings early morning including this morning However not clear if he is taking any correction boluses when he has high readings Last night blood sugar was significantly high before dinner but he thinks he did not eat lunch Again his pump is not downloaded because of an old version Unable to weigh him  today  He does take his Victoza regularly   Mealtimes: 8 am 12 pm, 6 pm  Proper timing of medications in relation to meals: Yes, usually 30 minutes before eating .          Previous data  CGM use % of time 93  2-week average/SD 137/55  Time in range 71  % Time Above 180 17+3  % Time above 250   % Time Below 70 3    Usually eating 2 meals a day with blood sugars improving after his first meal but not to normal Glucose data incomplete after evening meal but likely somewhat better with pre-meal blood sugar around 280-290   Physical activity: exercise:none   Last dietitian visit: 6/18 and last CDE visit in 4/16  Wt Readings from Last 3 Encounters:  05/18/22 (!) 302 lb (137 kg)  02/23/22 (!) 313 lb 6.4 oz (142.2 kg)  03/22/21 (!) 332 lb (150.6 kg)    LABS:   Lab Results  Component Value Date   HGBA1C 6.8 (A) 02/23/2022   HGBA1C 13.1 (A) 09/01/2021   HGBA1C 8.1 (A) 05/24/2021   Lab Results  Component Value Date   MICROALBUR 16.7 (H) 08/19/2019   LDLCALC 52 02/23/2022   CREATININE 3.35 (H) 02/23/2022    Lab Results  Component Value Date   FRUCTOSAMINE 280 03/09/2020   FRUCTOSAMINE 341 (H) 08/19/2019   FRUCTOSAMINE 333 (H) 04/22/2019  OTHER active problems are discussed in review of systems   Allergies as of 05/18/2022   No Known Allergies      Medication List        Accurate as of May 18, 2022  4:31 PM. If you have any questions, ask your nurse or doctor.          aspirin EC 81 MG tablet Take 1 tablet (81 mg total) by mouth 2 (two) times a day.   atorvastatin 80 MG tablet Commonly known as: LIPITOR Take 1 tablet (80 mg total) by mouth daily.   blood glucose meter kit and supplies Dispense based on patient and insurance preference. Use up to four times daily as directed. (FOR ICD-10 E10.9, E11.9).   carvedilol 6.25 MG tablet Commonly known as: COREG Take 1 tablet (6.25 mg total) by mouth 2 (two) times daily with a meal.   Dexcom G6  Sensor Misc Change every 10 days   Dexcom G7 Sensor Misc 1 Device by Does not apply route as directed. Change sensor every 10 days   Dexcom G6 Transmitter Misc Change every 3 months   DULoxetine 60 MG capsule Commonly known as: CYMBALTA Take 1 capsule (60 mg total) by mouth daily.   ezetimibe 10 MG tablet Commonly known as: ZETIA TAKE (1) TABLET BY MOUTH ONCE DAILY.   fenofibrate 145 MG tablet Commonly known as: TRICOR Take 1 tablet (145 mg total) by mouth daily.   freestyle lancets Use as instructed   FreeStyle Libre 2 Reader Kerrin Mo Use to check blood sugar daily   Dexcom G6 Receiver Devi Check blood sugar daily   Dexcom G7 Receiver Devi To display CGM data   furosemide 40 MG tablet Commonly known as: LASIX Take 40 mg by mouth. Take two tablets once daily   gabapentin 300 MG capsule Commonly known as: NEURONTIN Take 300 mg by mouth 3 (three) times daily. Take 333m by mouth three times daily.   glucose blood test strip Commonly known as: Accu-Chek Aviva Plus TEST UP TO 4 TIMES DAILY Dx code E11.65   HUMULIN R 500 UNIT/ML injection Generic drug: insulin regular human CONCENTRATED USE AS DIRECTED, INJECT UP TO 35 UNITS 3 TIMES A DAY.   Insulin Pen Needle 32G X 8 MM Misc Use as directed   levothyroxine 150 MCG tablet Commonly known as: SYNTHROID Take 1 tablet (150 mcg total) by mouth daily.   lisinopril 5 MG tablet Commonly known as: ZESTRIL Take 5 mg by mouth daily.   meclizine 25 MG tablet Commonly known as: ANTIVERT Take 1 tablet (25 mg total) by mouth every 8 (eight) hours as needed for dizziness.   midodrine 5 MG tablet Commonly known as: PROAMATINE Take 5 mg by mouth 3 (three) times daily.   Multi-Vitamins Tabs Take by mouth.   Omnipod Classic Pods (Gen 3) Misc Use for insulin delivery and change every 48 hours   polyethylene glycol 17 g packet Commonly known as: MIRALAX / GLYCOLAX Take 17 g by mouth daily as needed for mild  constipation.   tamsulosin 0.4 MG Caps capsule Commonly known as: FLOMAX Take by mouth.   tiZANidine 4 MG tablet Commonly known as: ZANAFLEX Take 4 mg by mouth 2 (two) times daily.   Victoza 18 MG/3ML Sopn Generic drug: liraglutide Inject 0.3 mLs (1.8 mg total) into the skin daily.   Vitamin D (Ergocalciferol) 1.25 MG (50000 UNIT) Caps capsule Commonly known as: DRISDOL Take 50,000 Units by mouth once a week.   Zegalogue 0.6  MG/0.6ML Soaj Generic drug: Dasiglucagon HCl Inject 0.6 mLs into the skin as needed. To be injected when unable to treat low blood sugars by mouth   zinc gluconate 50 MG tablet Take by mouth.        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 Brandon Melnick, uses bipap   Syncope    Type 2 diabetes mellitus ([place])    Venous insufficiency     Past Surgical History:  Procedure Laterality Date   AMPUTATION Right 06/20/2018   Procedure: RIGHT GREAT TOE AMPUTATION;  Surgeon: Newt Minion, MD;  Location: [place];  Service: Orthopedics;  Laterality: Right;   AMPUTATION Right 09/26/2018   Procedure: RIGHT FOOT 5TH RAY AMPUTATION;  Surgeon: Newt Minion, MD;  Location: [place];  Service: Orthopedics;  Laterality: Right;  MAC and regional anesthesia   AMPUTATION Right 10/05/2018   Procedure: RIGHT BELOW KNEE AMPUTATION;  Surgeon: Newt Minion, MD;  Location:  [place];  Service: Orthopedics;  Laterality: Right;   AMPUTATION Right 10/26/2018   Procedure: RIGHT ABOVE KNEE AMPUTATION;  Surgeon: Newt Minion, MD;  Location: [place];  Service: Orthopedics;  Laterality: Right;   COLONOSCOPY     RESECTION BONE TUMOR FEMUR  1980's   Left femur, treated at [place] with bone graft   STUMP REVISION Right 11/09/2018   Procedure: REVISION RIGHT ABOVE KNEE AMPUTATION;  Surgeon: Newt Minion, MD;  Location: [place];  Service: Orthopedics;  Laterality: Right;   TOTAL KNEE ARTHROPLASTY Left 2013    Family History  Problem Relation Age of Onset   Heart attack Mother 36   Breast cancer Mother    Stroke Mother    Heart attack Father 53   Breast cancer Sister    Colon cancer Sister     Social History:  reports that he has quit smoking. His smoking use included cigarettes. He has never used smokeless tobacco. He reports that he does not drink alcohol and does not use drugs.  Review of Systems:    HYPOTHYROIDISM:   on  levothyroxine 150 ug daily The dose was increased in 3/23 Has been taking the levothyroxine before breakfast  The dose was increased on his last visit   Lab Results  Component Value Date   TSH 2.54 02/23/2022   TSH 12.39 (H) 10/14/2021   TSH 3.30 02/10/2021   FREET4 1.17 02/23/2022   FREET4 1.01 10/14/2021   FREET4 0.95 03/09/2020     Lipids: triglycerides on labs on the last measurement are below 150 LDL has been controlled consistently He is on atorvastatin 80 mg, he thinks he is taking this regularly Because of his high  LDL ezetimibe was added in 6/22  Followed by PCP   Lab Results  Component Value Date   CHOL 93 02/23/2022   HDL 29.50 (L) 02/23/2022   LDLCALC 52 02/23/2022   LDLDIRECT 80.0 03/07/2016   TRIG 60.0 02/23/2022   CHOLHDL 3 02/23/2022      CKD: His creatinine has been variable, seen periodically by nephrologist  Going back for a while even though he was told to set up a follow-up on the last visit  Lab  Results  Component Value Date   CREATININE 3.35 (H) 02/23/2022   CREATININE 2.52 (H) 10/14/2021   CREATININE 2.0 (A) 02/10/2021   Hypertension: Managed by nephrologist He is on 5 mg lisinopril, carvedilol  BP Readings from Last 3 Encounters:  05/18/22 (!) 142/70  02/23/22 (!) 174/76  10/14/21 118/70    History of CHF, taking Lasix using 40 mg tablet      Examination:   BP (!) 142/70   Pulse 74   Ht _0  (1.753 m)   Wt (!) 302 lb (137 kg)   SpO2 98%   BMI 44.60 kg/m   Body mass index is 44.6 kg/m.     ASSESSMENT/ PLAN:   Diabetes type 2 with obesity, using an insulin pump  See history of present illness for detailed discussion of current diabetes management, blood sugar patterns and problems identified A1c is 6.8 last  He is still on OMNIPOD classic insulin pump with U-500 insulin along with Victoza Unable to download either his pump or get interpretable download from his Dexcom today   Plan:   He will talk to his supplier about getting the new OmniPod pump The data on his Dexcom receiver was updated For now he will reduce his basal at 2 AM down to 0.25 and go up at 4 PM up to 1.4 We will bolus anytime his blood sugars over 200 with correction factor already programmed To bolus consistently for all meals and snacks  CKD: He needs to follow-up with his nephrologist     Patient Instructions  Basal 2 am is 0.25  At 4 pm 1.4  Bolus for sugars >200  Get Omnipod dash   Call Kidney Dr    Elayne Snare 05/18/2022, 4:31 PM   Note: This office note was prepared with Dragon voice recognition system technology. Any transcriptional errors that result from this process are unintentional.

## 2022-05-18 NOTE — Patient Instructions (Addendum)
Basal 2 am is 0.25  At 4 pm 1.4  Bolus for sugars >200  Get Omnipod dash   Call Kidney Dr

## 2022-08-01 ENCOUNTER — Other Ambulatory Visit: Payer: Self-pay | Admitting: Endocrinology

## 2022-08-31 ENCOUNTER — Ambulatory Visit: Payer: Medicaid Other | Admitting: Endocrinology

## 2022-09-20 ENCOUNTER — Other Ambulatory Visit: Payer: Self-pay | Admitting: Endocrinology

## 2022-10-04 ENCOUNTER — Encounter: Payer: Self-pay | Admitting: Endocrinology

## 2022-10-04 ENCOUNTER — Ambulatory Visit (INDEPENDENT_AMBULATORY_CARE_PROVIDER_SITE_OTHER): Payer: Medicaid Other | Admitting: Endocrinology

## 2022-10-04 ENCOUNTER — Ambulatory Visit: Payer: Medicaid Other | Admitting: Endocrinology

## 2022-10-04 VITALS — BP 150/76 | HR 72 | Ht 69.0 in

## 2022-10-04 DIAGNOSIS — E039 Hypothyroidism, unspecified: Secondary | ICD-10-CM

## 2022-10-04 DIAGNOSIS — N184 Chronic kidney disease, stage 4 (severe): Secondary | ICD-10-CM

## 2022-10-04 DIAGNOSIS — E1165 Type 2 diabetes mellitus with hyperglycemia: Secondary | ICD-10-CM | POA: Diagnosis not present

## 2022-10-04 DIAGNOSIS — E1142 Type 2 diabetes mellitus with diabetic polyneuropathy: Secondary | ICD-10-CM | POA: Diagnosis not present

## 2022-10-04 DIAGNOSIS — Z794 Long term (current) use of insulin: Secondary | ICD-10-CM | POA: Diagnosis not present

## 2022-10-04 LAB — BASIC METABOLIC PANEL
BUN: 44 mg/dL — ABNORMAL HIGH (ref 6–23)
CO2: 23 mEq/L (ref 19–32)
Calcium: 9.6 mg/dL (ref 8.4–10.5)
Chloride: 111 mEq/L (ref 96–112)
Creatinine, Ser: 2.87 mg/dL — ABNORMAL HIGH (ref 0.40–1.50)
GFR: 22.92 mL/min — ABNORMAL LOW (ref >60.00)
Glucose, Bld: 120 mg/dL — ABNORMAL HIGH (ref 70–99)
Potassium: 5.1 mEq/L (ref 3.5–5.1)
Sodium: 140 mEq/L (ref 135–145)

## 2022-10-04 LAB — TSH: TSH: 5.61 u[IU]/mL — ABNORMAL HIGH (ref 0.35–5.50)

## 2022-10-04 LAB — POCT GLYCOSYLATED HEMOGLOBIN (HGB A1C): Hemoglobin A1C: 7.8 % — AB (ref 4.0–5.6)

## 2022-10-04 LAB — T4, FREE: Free T4: 0.97 ng/dL (ref 0.60–1.60)

## 2022-10-04 NOTE — Patient Instructions (Signed)
BOLUS for all meals 30-minute before eating based on how many carbs you are getting  Also bolus for any blood sugar over 200 even if not eating Bolus using at least 10 g of carbohydrate for drinking coffee in the morning  Change pump settings as directed  May use the Dexcom on the abdomen

## 2022-10-04 NOTE — Progress Notes (Unsigned)
Patient ID: Ethan Campbell, male   DOB: 10/21/1960, 62 y.o.   MRN: [id number]   Reason for Appointment: follow-up   History of Present Illness   Diagnosis: Type 2 DIABETES MELITUS, date of diagnosis: 2000    Previous history: he has been on insulin for several years with consistently poor control Has been requiring large doses of insulin for his diabetes but A1c has been persistently high Blood sugars did not improve significantly even with trying Byetta and Victoza In 2014 he was switched from NovoLog to U-500 insulin but not clear if he has had improvement in control except with fasting readings His prior A1c was 14.0 in 5/14 and he had educational discussions with diabetes educator and dietitian in 6/14   He was started on the OMNIPOD pump on 02/06/2018  Recent history:  Non-insulin hypoglycemic drugs: Victoza 1.8 mg daily  Insulin: Humulin R U-500   Omnipod PUMP settings:   Basal rate settings 12 AM = 0.45, 2 AM = 0.35, 8 AM = 0.80,  and 4 PM = 1.20  Preset boluses: 4-7 units with IC ratio 10 Blood sugar target 150 between 12 AM-7 AM and then 130 Sensitivity 1: 50, active insulin 6 hours  His A1c is dramatically better at 6.8 compared to 13.1  Current management, blood sugar patterns and problems identified:  He has used the Dexcom G7 now in the last month Appears that he has not set up the time properly on his download today shows data from both the old and new sensors and not clear what his patterns are However on his reader his time in range is only about 42% Unclear what patterns he has but he reports higher readings in the evenings and tendency to low readings early morning including this morning However not clear if he is taking any correction boluses when he has high readings Last night blood sugar was significantly high before dinner but he thinks he did not eat lunch Again his pump is not downloaded because of an old version Unable to weigh him  today  He does take his Victoza regularly   Mealtimes: 8 am 12 pm, 6 pm  Proper timing of medications in relation to meals: Yes, usually 30 minutes before eating .          Previous data  CGM use % of time 93  2-week average/SD 137/55  Time in range 71  % Time Above 180 17+3  % Time above 250   % Time Below 70 3    Usually eating 2 meals a day with blood sugars improving after his first meal but not to normal Glucose data incomplete after evening meal but likely somewhat better with pre-meal blood sugar around 280-290   Physical activity: exercise:none   Last dietitian visit: 6/18 and last CDE visit in 4/16  Wt Readings from Last 3 Encounters:  05/18/22 (!) 302 lb (137 kg)  02/23/22 (!) 313 lb 6.4 oz (142.2 kg)  03/22/21 (!) 332 lb (150.6 kg)    LABS:   Lab Results  Component Value Date   HGBA1C 7.8 (A) 10/04/2022   HGBA1C 6.8 (A) 02/23/2022   HGBA1C 13.1 (A) 09/01/2021   Lab Results  Component Value Date   MICROALBUR 16.7 (H) 08/19/2019   LDLCALC 52 02/23/2022   CREATININE 3.35 (H) 02/23/2022    Lab Results  Component Value Date   FRUCTOSAMINE 280 03/09/2020   FRUCTOSAMINE 341 (H) 08/19/2019   FRUCTOSAMINE 333 (H) 04/22/2019  OTHER active problems are discussed in review of systems   Allergies as of 10/04/2022   No Known Allergies      Medication List        Accurate as of October 04, 2022 10:02 AM. If you have any questions, ask your nurse or doctor.          aspirin EC 81 MG tablet Take 1 tablet (81 mg total) by mouth 2 (two) times a day.   atorvastatin 80 MG tablet Commonly known as: LIPITOR Take 1 tablet (80 mg total) by mouth daily.   blood glucose meter kit and supplies Dispense based on patient and insurance preference. Use up to four times daily as directed. (FOR ICD-10 E10.9, E11.9).   carvedilol 6.25 MG tablet Commonly known as: COREG Take 1 tablet (6.25 mg total) by mouth 2 (two) times daily with a meal.   Dexcom G6  Sensor Misc Change every 10 days   Dexcom G7 Sensor Misc USE FOR CONTINUOUS BLOOD GLUCOSE MONITORING, REPLACE EVERY 10 DAYS.   Dexcom G6 Transmitter Misc Change every 3 months   DULoxetine 60 MG capsule Commonly known as: CYMBALTA Take 1 capsule (60 mg total) by mouth daily.   ezetimibe 10 MG tablet Commonly known as: ZETIA TAKE (1) TABLET BY MOUTH ONCE DAILY.   fenofibrate 145 MG tablet Commonly known as: TRICOR Take 1 tablet (145 mg total) by mouth daily.   freestyle lancets Use as instructed   FreeStyle Libre 2 Reader Kerrin Mo Use to check blood sugar daily   Dexcom G6 Receiver Devi Check blood sugar daily   Dexcom G7 Receiver Devi To display CGM data   furosemide 40 MG tablet Commonly known as: LASIX Take 40 mg by mouth. Take two tablets once daily   gabapentin 300 MG capsule Commonly known as: NEURONTIN Take 300 mg by mouth 3 (three) times daily. Take '300mg'$  by mouth three times daily.   glucose blood test strip Commonly known as: Accu-Chek Aviva Plus TEST UP TO 4 TIMES DAILY Dx code E11.65   HUMULIN R 500 UNIT/ML injection Generic drug: insulin regular human CONCENTRATED USE AS DIRECTED, INJECT UP TO 35 UNITS 3 TIMES A DAY.   Insulin Pen Needle 32G X 8 MM Misc Use as directed   levothyroxine 150 MCG tablet Commonly known as: SYNTHROID Take 1 tablet (150 mcg total) by mouth daily.   lisinopril 5 MG tablet Commonly known as: ZESTRIL Take 5 mg by mouth daily.   meclizine 25 MG tablet Commonly known as: ANTIVERT Take 1 tablet (25 mg total) by mouth every 8 (eight) hours as needed for dizziness.   midodrine 5 MG tablet Commonly known as: PROAMATINE Take 5 mg by mouth 3 (three) times daily.   Multi-Vitamins Tabs Take by mouth.   Omnipod Classic Pods (Gen 3) Misc Use for insulin delivery and change every 48 hours   polyethylene glycol 17 g packet Commonly known as: MIRALAX / GLYCOLAX Take 17 g by mouth daily as needed for mild constipation.    tamsulosin 0.4 MG Caps capsule Commonly known as: FLOMAX Take by mouth.   tiZANidine 4 MG tablet Commonly known as: ZANAFLEX Take 4 mg by mouth 2 (two) times daily.   Victoza 18 MG/3ML Sopn Generic drug: liraglutide Inject 0.3 mLs (1.8 mg total) into the skin daily.   Vitamin D (Ergocalciferol) 1.25 MG (50000 UNIT) Caps capsule Commonly known as: DRISDOL Take 50,000 Units by mouth once a week.   Zegalogue 0.6 MG/0.6ML Soaj Generic drug: Dasiglucagon  HCl Inject 0.6 mLs into the skin as needed. To be injected when unable to treat low blood sugars by mouth   zinc gluconate 50 MG tablet Take by mouth.        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 Brandon Melnick, uses bipap   Syncope    Type 2 diabetes mellitus ([place])    Venous insufficiency     Past Surgical History:  Procedure Laterality Date   AMPUTATION Right 06/20/2018   Procedure: RIGHT GREAT TOE AMPUTATION;  Surgeon: Newt Minion, MD;  Location: [place];  Service: Orthopedics;  Laterality: Right;   AMPUTATION Right 09/26/2018   Procedure: RIGHT FOOT 5TH RAY AMPUTATION;  Surgeon: Newt Minion, MD;  Location: [place];  Service: Orthopedics;  Laterality: Right;  MAC and regional anesthesia   AMPUTATION Right 10/05/2018   Procedure: RIGHT BELOW KNEE AMPUTATION;  Surgeon: Newt Minion, MD;  Location: [place];  Service:  Orthopedics;  Laterality: Right;   AMPUTATION Right 10/26/2018   Procedure: RIGHT ABOVE KNEE AMPUTATION;  Surgeon: Newt Minion, MD;  Location: [facility];  Service: Orthopedics;  Laterality: Right;   COLONOSCOPY     RESECTION BONE TUMOR FEMUR  1980's   Left femur, treated at [facility] with bone graft   STUMP REVISION Right 11/09/2018   Procedure: REVISION RIGHT ABOVE KNEE AMPUTATION;  Surgeon: Newt Minion, MD;  Location: [facility];  Service: Orthopedics;  Laterality: Right;   TOTAL KNEE ARTHROPLASTY Left 2013    Family History  Problem Relation Age of Onset   Heart attack Mother 11   Breast cancer Mother    Stroke Mother    Heart attack Father 11   Breast cancer Sister    Colon cancer Sister     Social History:  reports that he has quit smoking. His smoking use included cigarettes. He has never used smokeless tobacco. He reports that he does not drink alcohol and does not use drugs.  Review of Systems:    HYPOTHYROIDISM:   on  levothyroxine 150 ug daily The dose was increased in 3/23 Has been taking the levothyroxine before breakfast  The dose was increased   Lab Results  Component Value Date   TSH 2.54 02/23/2022   TSH 12.39 (H) 10/14/2021   TSH 3.30 02/10/2021   FREET4 1.17 02/23/2022   FREET4 1.01 10/14/2021   FREET4 0.95 03/09/2020     Lipids: triglycerides on labs on the last measurement are below 150 LDL has been controlled consistently He is on atorvastatin 80 mg, he thinks he is taking this regularly Because of his high LDL ezetimibe was added in 6/22  Followed by  PCP   Lab Results  Component Value Date   CHOL 93 02/23/2022   HDL 29.50 (L) 02/23/2022   LDLCALC 52 02/23/2022   LDLDIRECT 80.0 03/07/2016   TRIG 60.0 02/23/2022   CHOLHDL 3 02/23/2022      CKD: His creatinine has been variable, seen periodically by nephrologist  Going back for a while even though he was told to set up a follow-up on the last visit  Lab Results  Component Value Date    CREATININE 3.35 (H) 02/23/2022   CREATININE 2.52 (H) 10/14/2021   CREATININE 2.0 (A) 02/10/2021   Hypertension: Managed by nephrologist He is on 5 mg lisinopril, carvedilol  BP Readings from Last 3 Encounters:  10/04/22 (!) 150/76  05/18/22 (!) 142/70  02/23/22 (!) 174/76    History of CHF, taking Lasix using 40 mg tablet    Examination:   BP (!) 150/76 (BP Location: Left Arm, Patient Position: Sitting, Cuff Size: Normal)   Pulse 72   Ht '5\' 9"'$  (1.753 m)   SpO2 95%   BMI 44.60 kg/m   Body mass index is 44.6 kg/m.     ASSESSMENT/ PLAN:   Diabetes type 2 with obesity, using an insulin pump  See history of present illness for detailed discussion of current diabetes management, blood sugar patterns and problems identified A1c  He is still on OMNIPOD classic insulin pump with U-500 insulin along with Victoza Unable to download either his pump or get interpretable download from his Dexcom today   Plan:   He will talk to his supplier about getting the new OmniPod pump The data on his Dexcom receiver was updated For now he will reduce his basal at 2 AM down to 0.25 and go up at 4 PM up to 1.4 We will bolus anytime his blood sugars over 200 with correction factor already programmed To bolus consistently for all meals and snacks  CKD: He needs to follow-up with his nephrologist     There are no Patient Instructions on file for this visit.    Elayne Snare 10/04/2022, 10:02 AM   Note: This office note was prepared with Dragon voice recognition system technology. Any transcriptional errors that result from this process are unintentional.

## 2022-10-04 NOTE — Progress Notes (Unsigned)
Patient not able to leave a urine sample for the Urine Microalbumin.

## 2022-10-06 ENCOUNTER — Encounter: Payer: Self-pay | Admitting: Endocrinology

## 2022-10-06 ENCOUNTER — Other Ambulatory Visit: Payer: Self-pay

## 2022-10-10 ENCOUNTER — Other Ambulatory Visit: Payer: Self-pay

## 2022-10-10 ENCOUNTER — Other Ambulatory Visit (HOSPITAL_COMMUNITY): Payer: Self-pay

## 2022-10-10 DIAGNOSIS — E1165 Type 2 diabetes mellitus with hyperglycemia: Secondary | ICD-10-CM

## 2022-10-10 DIAGNOSIS — E039 Hypothyroidism, unspecified: Secondary | ICD-10-CM

## 2022-10-10 MED ORDER — LEVOTHYROXINE SODIUM 125 MCG PO TABS
ORAL_TABLET | ORAL | 3 refills | Status: DC
  Filled 2022-10-10: qty 90, fill #0

## 2022-11-02 ENCOUNTER — Other Ambulatory Visit: Payer: Self-pay | Admitting: Endocrinology

## 2022-11-18 ENCOUNTER — Telehealth: Payer: Self-pay

## 2022-11-18 NOTE — Telephone Encounter (Signed)
Patient called and LVM stating he is out of omnipods and requests refill to be sent to [facility] in [place].

## 2022-11-20 MED ORDER — OMNIPOD CLASSIC PODS (GEN 3) MISC: 2 refills | Status: DC

## 2022-11-20 NOTE — Telephone Encounter (Signed)
Done

## 2022-11-22 ENCOUNTER — Other Ambulatory Visit (HOSPITAL_COMMUNITY): Payer: Self-pay

## 2022-11-22 ENCOUNTER — Telehealth: Payer: Self-pay

## 2022-11-22 NOTE — Telephone Encounter (Signed)
Patient called and left a VM stating that the Omnipods are $2000 oop and wants to know what can he do since the price is so high?  Please advise,

## 2022-11-22 NOTE — Telephone Encounter (Deleted)
Patient called and left a VM stating that the Omnipods are $2000 oop and wants to know what can he do since the price is so high?  Please advise,  

## 2022-11-23 NOTE — Telephone Encounter (Signed)
Spoke with patient and he states he called [facility] again and was told they would be sending him 2 boxes. Per patient nothing further needed at this time.

## 2022-12-13 ENCOUNTER — Other Ambulatory Visit (HOSPITAL_COMMUNITY): Payer: Self-pay

## 2022-12-13 ENCOUNTER — Telehealth: Payer: Self-pay

## 2022-12-13 NOTE — Telephone Encounter (Signed)
Patient Advocate Encounter   Received notification from [facility] that prior authorization renewal is required for Dexcom G7 sensor  Submitted via fax: 12/13/2022 Fax# [phone]  Status is pending

## 2022-12-14 ENCOUNTER — Ambulatory Visit: Payer: Medicaid Other | Admitting: Endocrinology

## 2022-12-16 ENCOUNTER — Telehealth: Payer: Self-pay

## 2022-12-16 ENCOUNTER — Other Ambulatory Visit (HOSPITAL_COMMUNITY): Payer: Self-pay

## 2022-12-16 NOTE — Telephone Encounter (Signed)
Patient called stating his dexcom 7 needs prior auth.

## 2022-12-16 NOTE — Telephone Encounter (Signed)
PA was submitted 12/13/22. Have not received determination yet

## 2022-12-20 ENCOUNTER — Telehealth: Payer: Self-pay

## 2022-12-20 NOTE — Telephone Encounter (Signed)
Patient is calling to check on the status. I called and spoke with Amy at [facility] and the PA needs to be through [employer].

## 2022-12-20 NOTE — Telephone Encounter (Signed)
Patient is currently out of Sensors and his PA is pending  He is coming to pick up 1 Dexcom Sensor to last him the 10 days until his PA has been approved.

## 2022-12-21 NOTE — Telephone Encounter (Signed)
G7 sensor picked up

## 2022-12-23 ENCOUNTER — Other Ambulatory Visit (HOSPITAL_COMMUNITY): Payer: Self-pay

## 2022-12-27 NOTE — Telephone Encounter (Signed)
Pharmacy Patient Advocate Encounter  Prior Authorization has been APPROVED  Effective through 06/24/2023

## 2022-12-28 NOTE — Telephone Encounter (Signed)
Pt has been notified and voices understanding.  

## 2022-12-30 NOTE — Telephone Encounter (Signed)
Pharmacy Patient Advocate Encounter  Prior Authorization has been APPROVED  Effective through 06/24/2023    

## 2023-01-04 ENCOUNTER — Ambulatory Visit: Payer: Medicaid Other | Admitting: Endocrinology

## 2023-01-04 NOTE — Progress Notes (Deleted)
Patient ID: Ethan Campbell, male   DOB: 01/20/1961, 62 y.o.   MRN: [id number]   Reason for Appointment: follow-up   History of Present Illness   Diagnosis: Type 2 DIABETES MELITUS, date of diagnosis: 2000    Previous history: he has been on insulin for several years with consistently poor control Has been requiring large doses of insulin for his diabetes but A1c has been persistently high Blood sugars did not improve significantly even with trying Byetta and Victoza In 2014 he was switched from NovoLog to U-500 insulin but not clear if he has had improvement in control except with fasting readings His prior A1c was 14.0 in 5/14 and he had educational discussions with diabetes educator and dietitian in 6/14   He was started on the OMNIPOD pump on 02/06/2018  Recent history:  Non-insulin hypoglycemic drugs: Victoza 1.8 mg daily  Insulin: Humulin R U-500   Omnipod PUMP settings:   Basal rate settings 12 AM = 0.45, 2 AM = 0.35, 8 AM = 0.80,  and 4 PM = 1.20  Preset boluses: 4-7 units with IC ratio 10 Blood sugar target 150 between 12 AM-7 AM and then 130 Sensitivity 1: 50, active insulin 6 hours  His A1c is 7.8 compared to 6.8  Current management, blood sugar patterns and problems identified:  He has used the Dexcom G7 Recently starting on 3/1 Previously unable to get clear pattern from his Dexcom download However compared to his last visit his overall control is significantly worse with recent GMI 8.5 He appears to be bolusing infrequently and generally once a day and occasionally none This is despite eating 2 meals a day, generally about noon and 6 PM Also sometimes in the evenings his boluses appear to be late in the evening when the blood sugars have gone up He will only take boluses when he is eating and not doing any correction boluses when his blood sugars are high which are frequently well over 200 in the mornings Also not clear if his boluses help with  bringing blood sugars down and continue to have significant postprandial spikes especially in the midday Also blood sugar appears to be rising early morning after 6-7 AM  He was told to try and get the OmniPod 5 but he did not get any answer from the supplier  He does take his Victoza regularly   Mealtimes: 8 am 12 pm, 6 pm  Proper timing of medications in relation to meals: Yes, usually 30 minutes before eating .          CGM data for the last 2 weeks through 3/11 shows the following  High variability is present especially overnight until late afternoon OVERNIGHT blood sugars are highly variable with periods of significant hyperglycemia but also a few days with blood sugars within target; HYPOGLYCEMIA seen only on 9/3 early morning Will get hyperglycemic episodes are seen most frequently in the morning hours until early afternoon 6 and only occasionally continuing into the evenings Hypoglycemia in the afternoon is present only on 1 occasion yesterday but mild and transient and also once around 8 PM Overall average blood sugar is at or above the target range at all times Other details as above  CGM use % of time   2-week average/GV 218/40  Time in range 41  % Time Above 180 24  % Time above 250 34  % Time Below 70 1      Previous data  CGM use % of  time 93  2-week average/SD 137/55  Time in range 71  % Time Above 180 17+3  % Time above 250   % Time Below 70 3    Usually eating 2 meals a day with blood sugars improving after his first meal but not to normal Glucose data incomplete after evening meal but likely somewhat better with pre-meal blood sugar around 280-290   Physical activity: exercise:none   Last dietitian visit: 6/18 and last CDE visit in 4/16  Wt Readings from Last 3 Encounters:  05/18/22 (!) 302 lb (137 kg)  02/23/22 (!) 313 lb 6.4 oz (142.2 kg)  03/22/21 (!) 332 lb (150.6 kg)    LABS:   Lab Results  Component Value Date   HGBA1C 7.8 (A)  10/04/2022   HGBA1C 6.8 (A) 02/23/2022   HGBA1C 13.1 (A) 09/01/2021   Lab Results  Component Value Date   MICROALBUR 16.7 (H) 08/19/2019   LDLCALC 52 02/23/2022   CREATININE 2.87 (H) 10/04/2022    Lab Results  Component Value Date   FRUCTOSAMINE 280 03/09/2020   FRUCTOSAMINE 341 (H) 08/19/2019   FRUCTOSAMINE 333 (H) 04/22/2019    OTHER active problems are discussed in review of systems   Allergies as of 10/04/2022   No Known Allergies      Medication List        Accurate as of October 04, 2022 11:59 PM. If you have any questions, ask your nurse or doctor.          aspirin EC 81 MG tablet Take 1 tablet (81 mg total) by mouth 2 (two) times a day.   atorvastatin 80 MG tablet Commonly known as: LIPITOR Take 1 tablet (80 mg total) by mouth daily.   blood glucose meter kit and supplies Dispense based on patient and insurance preference. Use up to four times daily as directed. (FOR ICD-10 E10.9, E11.9).   carvedilol 6.25 MG tablet Commonly known as: COREG Take 1 tablet (6.25 mg total) by mouth 2 (two) times daily with a meal.   Dexcom G6 Sensor Misc Change every 10 days   Dexcom G7 Sensor Misc USE FOR CONTINUOUS BLOOD GLUCOSE MONITORING, REPLACE EVERY 10 DAYS.   Dexcom G6 Transmitter Misc Change every 3 months   DULoxetine 60 MG capsule Commonly known as: CYMBALTA Take 1 capsule (60 mg total) by mouth daily.   ezetimibe 10 MG tablet Commonly known as: ZETIA TAKE (1) TABLET BY MOUTH ONCE DAILY.   fenofibrate 145 MG tablet Commonly known as: TRICOR Take 1 tablet (145 mg total) by mouth daily.   freestyle lancets Use as instructed   FreeStyle Libre 2 Reader Hardie Pulley Use to check blood sugar daily   Dexcom G6 Receiver Devi Check blood sugar daily   Dexcom G7 Receiver Devi To display CGM data   furosemide 40 MG tablet Commonly known as: LASIX Take 40 mg by mouth. Take two tablets once daily   gabapentin 300 MG capsule Commonly known as:  NEURONTIN Take 300 mg by mouth 3 (three) times daily. Take 300mg  by mouth three times daily.   glucose blood test strip Commonly known as: Accu-Chek Aviva Plus TEST UP TO 4 TIMES DAILY Dx code E11.65   HUMULIN R 500 UNIT/ML injection Generic drug: insulin regular human CONCENTRATED USE AS DIRECTED, INJECT UP TO 35 UNITS 3 TIMES A DAY.   Insulin Pen Needle 32G X 8 MM Misc Use as directed   levothyroxine 150 MCG tablet Commonly known as: SYNTHROID Take 1 tablet (150 mcg  total) by mouth daily.   lisinopril 5 MG tablet Commonly known as: ZESTRIL Take 5 mg by mouth daily.   meclizine 25 MG tablet Commonly known as: ANTIVERT Take 1 tablet (25 mg total) by mouth every 8 (eight) hours as needed for dizziness.   midodrine 5 MG tablet Commonly known as: PROAMATINE Take 5 mg by mouth 3 (three) times daily.   Multi-Vitamins Tabs Take by mouth.   Omnipod Classic Pods (Gen 3) Misc Use for insulin delivery and change every 48 hours   polyethylene glycol 17 g packet Commonly known as: MIRALAX / GLYCOLAX Take 17 g by mouth daily as needed for mild constipation.   tamsulosin 0.4 MG Caps capsule Commonly known as: FLOMAX Take by mouth.   tiZANidine 4 MG tablet Commonly known as: ZANAFLEX Take 4 mg by mouth 2 (two) times daily.   Victoza 18 MG/3ML Sopn Generic drug: liraglutide Inject 0.3 mLs (1.8 mg total) into the skin daily.   Vitamin D (Ergocalciferol) 1.25 MG (50000 UNIT) Caps capsule Commonly known as: DRISDOL Take 50,000 Units by mouth once a week.   Zegalogue 0.6 MG/0.6ML Soaj Generic drug: Dasiglucagon HCl Inject 0.6 mLs into the skin as needed. To be injected when unable to treat low blood sugars by mouth   zinc gluconate 50 MG tablet Take by mouth.        Allergies: No Known Allergies  Past Medical History:  Diagnosis Date   Arm vein blood clot, left    on Coumadin   Arthritis    Cancer (HCC)    Bone cancer 1987 (in knee Large cell tumor)   Chronic  kidney disease    stage 3   Depression    Essential hypertension    Family history of adverse reaction to anesthesia    " my mother woke up during a cardiac surgery "   History of stroke    Hyperlipidemia    Hypothyroidism    Insomnia    Obesity    Precordial pain June 2011   Nuclear stress; no ischemia; EF 60%   Presence of permanent cardiac pacemaker    Proteinuria    Seizure disorder (HCC)    Sick sinus syndrome (HCC)    Sleep apnea    Dr. Ninetta Lights, uses bipap   Syncope    Type 2 diabetes mellitus (HCC)    Venous insufficiency     Past Surgical History:  Procedure Laterality Date   AMPUTATION Right 06/20/2018   Procedure: RIGHT GREAT TOE AMPUTATION;  Surgeon: Nadara Mustard, MD;  Location: [facility] OR;  Service: Orthopedics;  Laterality: Right;   AMPUTATION Right 09/26/2018   Procedure: RIGHT FOOT 5TH RAY AMPUTATION;  Surgeon: Nadara Mustard, MD;  Location: [facility] OR;  Service: Orthopedics;  Laterality: Right;  MAC and regional anesthesia   AMPUTATION Right 10/05/2018   Procedure: RIGHT BELOW KNEE AMPUTATION;  Surgeon: Nadara Mustard, MD;  Location: [facility] OR;  Service: Orthopedics;  Laterality: Right;   AMPUTATION Right 10/26/2018   Procedure: RIGHT ABOVE KNEE AMPUTATION;  Surgeon: Nadara Mustard, MD;  Location: [facility] OR;  Service: Orthopedics;  Laterality: Right;   COLONOSCOPY     RESECTION BONE TUMOR FEMUR  1980's   Left femur, treated at [facility] with bone graft   STUMP REVISION Right 11/09/2018   Procedure: REVISION RIGHT ABOVE KNEE AMPUTATION;  Surgeon: Nadara Mustard, MD;  Location: [facility] OR;  Service: Orthopedics;  Laterality: Right;   TOTAL KNEE ARTHROPLASTY Left 2013    Family  History  Problem Relation Age of Onset   Heart attack Mother 23   Breast cancer Mother    Stroke Mother    Heart attack Father 54   Breast cancer Sister    Colon cancer Sister     Social History:  reports that he has quit smoking. His smoking use included cigarettes. He has never used smokeless tobacco. He  reports that he does not drink alcohol and does not use drugs.  Review of Systems:    HYPOTHYROIDISM:   on  levothyroxine 150 ug daily The dose was increased in 3/23 Has been taking the levothyroxine before breakfast    Lab Results  Component Value Date   TSH 5.61 (H) 10/04/2022   TSH 2.54 02/23/2022   TSH 12.39 (H) 10/14/2021   FREET4 0.97 10/04/2022   FREET4 1.17 02/23/2022   FREET4 1.01 10/14/2021     Lipids: triglycerides on labs on the last measurement are below 150 LDL has been controlled consistently He is on atorvastatin 80 mg, he thinks he is taking this regularly Because of his high LDL ezetimibe was added in 6/22  Followed by PCP   Lab Results  Component Value Date   CHOL 93 02/23/2022   HDL 29.50 (L) 02/23/2022   LDLCALC 52 02/23/2022   LDLDIRECT 80.0 03/07/2016   TRIG 60.0 02/23/2022   CHOLHDL 3 02/23/2022      CKD: His creatinine has been variable, but not being followed by his nephrologist and he says that he had difficulty getting an appointment made  Lab Results  Component Value Date   CREATININE 2.87 (H) 10/04/2022   CREATININE 3.35 (H) 02/23/2022   CREATININE 2.52 (H) 10/14/2021   Hypertension: Managed by other specialists He is on 5 mg lisinopril, carvedilol  BP Readings from Last 3 Encounters:  10/04/22 (!) 150/76  05/18/22 (!) 142/70  02/23/22 (!) 174/76    History of CHF, taking Lasix using 40 mg tablet    Examination:   BP (!) 150/76 (BP Location: Left Arm, Patient Position: Sitting, Cuff Size: Normal)   Pulse 72   Ht 5\' 9"  (1.753 m)   SpO2 95%   BMI 44.60 kg/m   Body mass index is 44.6 kg/m.     ASSESSMENT/ PLAN:   Diabetes type 2 with obesity, using an insulin pump  See history of present illness for detailed discussion of current diabetes management, blood sugar patterns and problems identified  A1c 7.8 but his GMI recently is 8.5  He is still on OMNIPOD 4 insulin pump with U-500 insulin along with  Victoza  Blood sugar control is very inadequate and appears to get inadequate basal insulin overnight and until early afternoon Also his main difficulty is not bolusing adequately or skipping boluses Likely also not bolusing before eating at dinnertime Currently is using about 29 units insulin average per day although likely needs more boluses or correction doses which would be the equivalent of nearly 200 units of U-100 insulin   Plan:  BOLUS for all meals 30-minute before eating based on how many carbs you are getting  Also bolus for any blood sugar over 200 even if not eating Bolus using at least 10 g of carbohydrate for drinking coffee in the morning  Change pump settings as directed  May use the Dexcom on the abdomen to see if it stays on longer  Again consider using the Dexcom 5 system but he does not have a smart phone and will not be able to use  it  CKD: He needs to follow-up with his nephrologist and referral made, he also needs better management of his blood pressure  Hypothyroidism: Will need follow-up labs today   Patient Instructions  BOLUS for all meals 30-minute before eating based on how many carbs you are getting  Also bolus for any blood sugar over 200 even if not eating Bolus using at least 10 g of carbohydrate for drinking coffee in the morning  Change pump settings as directed  May use the Dexcom on the abdomen    Reather Littler 10/05/2022, 8:55 AM   Note: This office note was prepared with Dragon voice recognition system technology. Any transcriptional errors that result from this process are unintentional.

## 2023-01-13 ENCOUNTER — Telehealth: Payer: Self-pay

## 2023-01-13 NOTE — Telephone Encounter (Signed)
Contact patient for appointment for follow up to have [facility] paperwork completed

## 2023-01-17 ENCOUNTER — Other Ambulatory Visit: Payer: Self-pay | Admitting: Endocrinology

## 2023-01-17 DIAGNOSIS — E782 Mixed hyperlipidemia: Secondary | ICD-10-CM

## 2023-01-19 NOTE — Telephone Encounter (Signed)
[facility] called again about paperwork. Informed them that patient has to be seen. They are aware of upcoming appt. However, called patient and he states he does not need those supplies any longer.

## 2023-01-23 ENCOUNTER — Other Ambulatory Visit: Payer: Self-pay | Admitting: Endocrinology

## 2023-02-14 ENCOUNTER — Other Ambulatory Visit: Payer: Self-pay | Admitting: Endocrinology

## 2023-03-20 NOTE — Progress Notes (Signed)
H&P  Chief Complaint: Elevated PSA  History of Present Illness: 62 yo male is here for E/M of elevated PSA. Last year by report it was 4.5, with PSA last month 6.9.  Referred by Sherryll Burger.   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 Ninetta Lights, uses bipap   Syncope    Type 2 diabetes mellitus (HCC)    Venous insufficiency     Past Surgical History:  Procedure Laterality Date   AMPUTATION Right 06/20/2018   Procedure: RIGHT GREAT TOE AMPUTATION;  Surgeon: Nadara Mustard, MD;  Location: [facility] OR;  Service: Orthopedics;  Laterality: Right;   AMPUTATION Right 09/26/2018   Procedure: RIGHT FOOT 5TH RAY AMPUTATION;  Surgeon: Nadara Mustard, MD;  Location: [facility] OR;  Service: Orthopedics;  Laterality: Right;  MAC and regional anesthesia   AMPUTATION Right 10/05/2018   Procedure: RIGHT BELOW KNEE AMPUTATION;  Surgeon: Nadara Mustard, MD;  Location: [facility] OR;  Service: Orthopedics;  Laterality: Right;   AMPUTATION Right 10/26/2018   Procedure: RIGHT ABOVE KNEE AMPUTATION;  Surgeon: Nadara Mustard, MD;  Location: [facility] OR;  Service: Orthopedics;  Laterality: Right;   COLONOSCOPY     RESECTION BONE TUMOR FEMUR  1980's   Left femur, treated at [facility] with bone graft   STUMP REVISION Right 11/09/2018   Procedure: REVISION RIGHT ABOVE KNEE AMPUTATION;  Surgeon: Nadara Mustard, MD;  Location: [facility] OR;  Service: Orthopedics;  Laterality: Right;    TOTAL KNEE ARTHROPLASTY Left 2013    Home Medications:  Allergies as of 03/21/2023   No Known Allergies      Medication List        Accurate as of March 20, 2023  2:37 PM. If you have any questions, ask your nurse or doctor.          aspirin EC 81 MG tablet Take 1 tablet (81 mg total) by mouth 2 (two) times a day.   atorvastatin 80 MG tablet Commonly known as: LIPITOR Take 1 tablet (80 mg total) by mouth daily.   blood glucose meter kit and supplies Dispense based on patient and insurance preference. Use up to four times daily as directed. (FOR ICD-10 E10.9, E11.9).   carvedilol 6.25 MG tablet Commonly known as: COREG Take 1 tablet (6.25 mg total) by mouth 2 (two) times daily with a meal.   Dexcom G6 Sensor Misc Change every 10 days   Dexcom G7 Sensor Misc USE FOR CONTINUOUS BLOOD GLUCOSE MONITORING, REPLACE EVERY 10 DAYS.   Dexcom G6 Transmitter Misc Change every 3 months   DULoxetine 60 MG capsule Commonly known as: CYMBALTA Take 1 capsule (60 mg total) by mouth daily.   ezetimibe 10  MG tablet Commonly known as: ZETIA TAKE 1 TABLET BY MOUTH ONCE DAILY.   fenofibrate 145 MG tablet Commonly known as: TRICOR Take 1 tablet (145 mg total) by mouth daily.   freestyle lancets Use as instructed   FreeStyle Libre 2 Reader Hardie Pulley Use to check blood sugar daily   Dexcom G6 Receiver Devi Check blood sugar daily   Dexcom G7 Receiver Devi To display CGM data   furosemide 40 MG tablet Commonly known as: LASIX Take 40 mg by mouth. Take two tablets once daily   gabapentin 300 MG capsule Commonly known as: NEURONTIN Take 300 mg by mouth 3 (three) times daily. Take 300mg  by mouth three times daily.   glucose blood test strip Commonly known as: Accu-Chek Aviva Plus TEST UP TO 4 TIMES DAILY Dx code E11.65   HUMULIN R 500 UNIT/ML injection Generic drug: insulin regular human CONCENTRATED USE AS DIRECTED, INJECT UP TO 35 UNITS 3 TIMES A DAY.   Insulin Pen  Needle 32G X 8 MM Misc Use as directed   levothyroxine 125 MCG tablet Commonly known as: SYNTHROID Take 1 tablet daily   lisinopril 5 MG tablet Commonly known as: ZESTRIL Take 5 mg by mouth daily.   meclizine 25 MG tablet Commonly known as: ANTIVERT Take 1 tablet (25 mg total) by mouth every 8 (eight) hours as needed for dizziness.   midodrine 5 MG tablet Commonly known as: PROAMATINE Take 5 mg by mouth 3 (three) times daily.   Multi-Vitamins Tabs Take by mouth.   Omnipod Classic Pods (Gen 3) Misc Use for insulin delivery and change every 48 hours   polyethylene glycol 17 g packet Commonly known as: MIRALAX / GLYCOLAX Take 17 g by mouth daily as needed for mild constipation.   tamsulosin 0.4 MG Caps capsule Commonly known as: FLOMAX Take by mouth.   tiZANidine 4 MG tablet Commonly known as: ZANAFLEX Take 4 mg by mouth 2 (two) times daily.   Victoza 18 MG/3ML Sopn Generic drug: liraglutide Inject 0.3 mLs (1.8 mg total) into the skin daily.   Vitamin D (Ergocalciferol) 1.25 MG (50000 UNIT) Caps capsule Commonly known as: DRISDOL Take 50,000 Units by mouth once a week.   Zegalogue 0.6 MG/0.6ML Soaj Generic drug: Dasiglucagon HCl Inject 0.6 mLs into the skin as needed. To be injected when unable to treat low blood sugars by mouth   zinc gluconate 50 MG tablet Take by mouth.        Allergies: No Known Allergies  Family History  Problem Relation Age of Onset   Heart attack Mother 34   Breast cancer Mother    Stroke Mother    Heart attack Father 36   Breast cancer Sister    Colon cancer Sister     Social History:  reports that he has quit smoking. His smoking use included cigarettes. He has never used smokeless tobacco. He reports that he does not drink alcohol and does not use drugs.  ROS: A complete review of systems was performed.  All systems are negative except for pertinent findings as noted.  Physical Exam:  Vital signs in last 24  hours: There were no vitals taken for this visit. Constitutional:  Alert and oriented, No acute distress Cardiovascular: Regular rate  Respiratory: Normal respiratory effort GI: Abdomen is soft, nontender, nondistended, no abdominal masses. No CVAT.  Genitourinary: Normal male phallus, testes are descended bilaterally and non-tender and without masses, scrotum is normal in appearance without lesions or masses, perineum is normal on  inspection. Lymphatic: No lymphadenopathy Neurologic: Grossly intact, no focal deficits Psychiatric: Normal mood and affect  I have reviewed prior pt notes  I have reviewed notes from referring/previous physicians  I have reviewed urinalysis results  I have independently reviewed prior imaging  I have reviewed prior PSA results  I have reviewed prior urine culture   Impression/Assessment:  ***  Plan:  ***

## 2023-03-21 ENCOUNTER — Other Ambulatory Visit (HOSPITAL_COMMUNITY): Payer: Self-pay

## 2023-03-21 ENCOUNTER — Ambulatory Visit: Payer: Medicaid Other | Admitting: Urology

## 2023-03-21 ENCOUNTER — Encounter: Payer: Self-pay | Admitting: Urology

## 2023-03-21 VITALS — BP 139/84 | HR 74

## 2023-03-21 DIAGNOSIS — R972 Elevated prostate specific antigen [PSA]: Secondary | ICD-10-CM

## 2023-03-21 LAB — URINALYSIS, ROUTINE W REFLEX MICROSCOPIC
Bilirubin, UA: NEGATIVE
Ketones, UA: NEGATIVE
Leukocytes,UA: NEGATIVE
Nitrite, UA: NEGATIVE
Specific Gravity, UA: 1.025 (ref 1.005–1.030)
Urobilinogen, Ur: 0.2 mg/dL (ref 0.2–1.0)
pH, UA: 5.5 (ref 5.0–7.5)

## 2023-03-21 LAB — MICROSCOPIC EXAMINATION: Bacteria, UA: NONE SEEN

## 2023-03-21 LAB — BLADDER SCAN AMB NON-IMAGING: Scan Result: 0

## 2023-03-21 MED ORDER — LEVOFLOXACIN 750 MG PO TABS: 750.0000 mg | ORAL_TABLET | Freq: Every day | ORAL | 0 refills | Status: AC

## 2023-03-21 MED ORDER — LEVOFLOXACIN 750 MG PO TABS
750.0000 mg | ORAL_TABLET | Freq: Every day | ORAL | 0 refills | Status: DC
  Filled 2023-03-21: qty 1, 1d supply, fill #0

## 2023-03-21 NOTE — Patient Instructions (Addendum)
   Appointment Time: 8:30 Am Appointment Date: May 16, 2023  Location: [facility]   Prostate Biopsy Instructions  Stop all aspirin or blood thinners (aspirin, plavix, coumadin, warfarin, motrin, ibuprofen, advil, aleve, naproxen, naprosyn) for 7 days prior to the procedure.  If you have any questions about stopping these medications, please contact your primary care physician or cardiologist.  Having a light meal prior to the procedure is recommended.  If you are diabetic or have low blood sugar please bring a small snack or glucose tablet.  A Fleets enema is needed to be purchased over the counter at a local pharmacy and used 2 hours before you scheduled appointment.  This can be purchased over the counter at any pharmacy.  Antibiotics will be administered in the clinic at the time of the procedure and 1 tablet has been sent to your pharmacy. Please take the antibiotic as prescribed.    Please bring someone with you to the procedure to drive you home if you are given a valium to take prior to your procedure.   If you have any questions or concerns, please feel free to call the office at [phone] or send a Mychart message.    Thank you, [facility]

## 2023-04-11 ENCOUNTER — Ambulatory Visit: Payer: Medicaid Other | Admitting: Urology

## 2023-04-26 ENCOUNTER — Ambulatory Visit (INDEPENDENT_AMBULATORY_CARE_PROVIDER_SITE_OTHER): Payer: Medicaid Other | Admitting: "Endocrinology

## 2023-04-26 ENCOUNTER — Encounter: Payer: Self-pay | Admitting: "Endocrinology

## 2023-04-26 VITALS — BP 130/63 | HR 67 | Ht 69.0 in | Wt 285.0 lb

## 2023-04-26 DIAGNOSIS — E1165 Type 2 diabetes mellitus with hyperglycemia: Secondary | ICD-10-CM

## 2023-04-26 DIAGNOSIS — Z794 Long term (current) use of insulin: Secondary | ICD-10-CM

## 2023-04-26 LAB — COMPREHENSIVE METABOLIC PANEL
ALT: 37 U/L (ref 0–53)
AST: 37 U/L (ref 0–37)
Albumin: 3.5 g/dL (ref 3.5–5.2)
Alkaline Phosphatase: 47 U/L (ref 39–117)
BUN: 58 mg/dL — ABNORMAL HIGH (ref 6–23)
CO2: 22 meq/L (ref 19–32)
Calcium: 8.8 mg/dL (ref 8.4–10.5)
Chloride: 109 meq/L (ref 96–112)
Creatinine, Ser: 3.3 mg/dL — ABNORMAL HIGH (ref 0.40–1.50)
GFR: 19.31 mL/min — ABNORMAL LOW (ref >60.00)
Glucose, Bld: 197 mg/dL — ABNORMAL HIGH (ref 70–99)
Potassium: 6 meq/L — ABNORMAL HIGH (ref 3.5–5.1)
Sodium: 138 meq/L (ref 135–145)
Total Bilirubin: 0.5 mg/dL (ref 0.2–1.2)
Total Protein: 6.5 g/dL (ref 6.0–8.3)

## 2023-04-26 LAB — LIPID PANEL
Cholesterol: 99 mg/dL (ref 0–200)
HDL: 33.2 mg/dL — ABNORMAL LOW (ref >39.00)
LDL Cholesterol: 46 mg/dL (ref 0–99)
NonHDL: 65.79
Total CHOL/HDL Ratio: 3
Triglycerides: 98 mg/dL (ref 0.0–149.0)
VLDL: 19.6 mg/dL (ref 0.0–40.0)

## 2023-04-26 LAB — POCT GLYCOSYLATED HEMOGLOBIN (HGB A1C): Hemoglobin A1C: 6.7 % — AB (ref 4.0–5.6)

## 2023-04-26 LAB — MICROALBUMIN / CREATININE URINE RATIO
Creatinine,U: 77 mg/dL
Microalb Creat Ratio: 76.8 mg/g — ABNORMAL HIGH (ref 0.0–30.0)
Microalb, Ur: 59.2 mg/dL — ABNORMAL HIGH (ref 0.0–1.9)

## 2023-04-26 NOTE — Progress Notes (Signed)
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, nephropathy, retinopathy, pacemaker   Lab Results  Component Value Date   GFR 22.92 (L) 10/04/2022   Hba1c goal less than 7, current Hba1c is  Lab Results  Component Value Date   HGBA1C 6.7 (A) 04/26/2023   Will recommend the following: Insulin: Humulin R U-500  Omnipod PUMP settings:   Basal rate settings 12 AM = 0.45, 2 AM = 0.35, 8 AM = 0.90,  and 4 PM = 1.20  Preset boluses: 4-7 units with IC ratio 10 Blood sugar target 150 between 12 AM-7 AM and then 130 Sensitivity 1: 50, active insulin 6 hours  No known contraindications/side effects to any of above medications  -Last LD and Tg are as follows: Lab Results  Component Value Date   LDLCALC 52 02/23/2022    Lab Results  Component Value Date   TRIG 60.0 02/23/2022   -On atorvastatin 80 mg QD -Follow low fat diet and exercise   -Blood pressure goal <140/90 - Microalbumin/creatinine goal is < 30 -Last MA/Cr is as follows: Lab Results  Component Value Date   MICROALBUR 16.7 (H) 08/19/2019   -on ACE/ARB lisinopril 5 mg every day, sees nephrologist  -diet changes including salt restriction -limit eating outside -counseled BP targets per standards of diabetes care -uncontrolled blood pressure can lead to retinopathy, nephropathy and cardiovascular and atherosclerotic heart  disease  Reviewed and counseled on: -A1C target -Blood sugar targets -Complications of uncontrolled diabetes  -Checking blood sugar before meals and bedtime and bring log next visit -All medications with mechanism of action and side effects -Hypoglycemia management: rule of 15's, Glucagon Emergency Kit and medical alert ID -low-carb low-fat plate-method diet -At least 20 minutes of physical activity per day -Annual dilated retinal eye exam and foot exam -compliance and follow up needs -follow up as scheduled or earlier if problem gets worse  Call if blood sugar is less than 70 or consistently above 250    Take a 15 gm snack of carbohydrate at bedtime before you go to sleep if your blood sugar is less than 100.    If you are going to fast after midnight for a test or procedure, ask your physician for instructions on how to reduce/decrease your insulin dose.    Call if blood sugar is less than 70 or consistently above 250  -Treating a low sugar by rule of 15  (15 gms of sugar every 15 min until sugar is more than 70) If you feel your sugar is low, test your sugar to be sure If your sugar is low (less than 70), then take 15 grams of a fast acting Carbohydrate (3-4 glucose tablets or glucose gel or 4 ounces of juice or regular soda) Recheck your sugar 15 min after treating low to make sure it is more than 70 If sugar is still less than  70, treat again with 15 grams of carbohydrate          Don't drive the hour of hypoglycemia  If unconscious/unable to eat or drink by mouth, use glucagon injection or nasal spray baqsimi and call 911. Can repeat again in 15 min if still unconscious.  Return in about 3 months (around 07/27/2023) for visit, labs today.   I have reviewed current medications, nurse's notes, allergies, vital signs, past medical and surgical history, family medical history, and social history for this encounter. Counseled patient on symptoms, examination findings, lab findings,  imaging results, treatment decisions and monitoring and prognosis. The patient understood the recommendations and agrees with the treatment plan. All questions regarding treatment plan were fully answered.  Ethan Belmont, MD  04/26/23    History of Present Illness Ethan Campbell is a 62 y.o. year old male who presents for evaluation of Type II diabetes mellitus.  Ethan Campbell was first diagnosed in 2000.   Diabetes education +  Home diabetes regimen: He was started on the OMNIPOD pump on 02/06/2018  Recent history:  Non-insulin hypoglycemic drugs: Victoza 1.8 mg daily?  Insulin: Humulin R U-500  Omnipod PUMP settings:   Basal rate settings 12 AM = 0.45, 2 AM = 0.35, 8 AM = 0.80,  and 4 PM = 1.20  Preset boluses: 4-7 units with IC ratio 10 Blood sugar target 150 between 12 AM-7 AM and then 130 Sensitivity 1: 50, active insulin 6 hours   Previous history: he has been on insulin for several years with consistently poor control Has been requiring large doses of insulin for his diabetes but A1c has been persistently high Blood sugars did not improve significantly even with trying Byetta and Victoza In 2014 he was switched from NovoLog to U-500 insulin but not clear if he has had improvement in control except with fasting readings His prior A1c was 14.0 in 5/14 and he had educational discussions with diabetes educator and dietitian in 6/14    COMPLICATIONS +  MI/Stroke +  retinopathy +  neuropathy +  nephropathy  BLOOD SUGAR DATA  CGM interpretation: At today's visit, we reviewed her CGM downloads. The full report is scanned in the media. Reviewing the CGM trends, BG are mildly elevated in daytime.  Physical Exam  BP 130/63   Pulse 67   Ht 5\' 9"  (1.753 m)   Wt 285 lb (129.3 kg)   SpO2 98%   BMI 42.09 kg/m    Constitutional: well developed, well nourished Head: normocephalic, atraumatic Eyes: sclera anicteric, no redness Neck: supple Lungs: normal  respiratory effort Neurology: alert and oriented Skin: dry, no appreciable rashes Musculoskeletal: no appreciable defects Psychiatric: normal mood and affect Diabetic Foot Exam - Simple   Simple Foot Form Diabetic Foot exam was performed with the following findings: Yes 04/26/2023 10:12 AM  Visual Inspection See comments: Yes Sensation Testing See comments: Yes Pulse Check See comments: Yes Comments R above knee amputation  Left foot swelling Monofilament 0/3  Dorsalis pedis Pulse + Left leg compression       Current Medications Patient's Medications  New Prescriptions   No medications on file  Previous Medications   ASPIRIN EC 81 MG EC TABLET    Take 1 tablet (81 mg total) by mouth 2 (two) times a day.   ATORVASTATIN (LIPITOR) 80 MG TABLET    Take 1 tablet (80 mg total) by mouth daily.   BLOOD GLUCOSE METER KIT AND SUPPLIES    Dispense based  on patient and insurance preference. Use up to four times daily as directed. (FOR ICD-10 E10.9, E11.9).   CARVEDILOL (COREG) 6.25 MG TABLET    Take 1 tablet (6.25 mg total) by mouth 2 (two) times daily with a meal.   CONTINUOUS BLOOD GLUC RECEIVER (DEXCOM G6 RECEIVER) DEVI    Check blood sugar daily   CONTINUOUS BLOOD GLUC RECEIVER (DEXCOM G7 RECEIVER) DEVI    To display CGM data   CONTINUOUS BLOOD GLUC RECEIVER (FREESTYLE LIBRE 2 READER) DEVI    Use to check blood sugar daily   CONTINUOUS BLOOD GLUC SENSOR (DEXCOM G6 SENSOR) MISC    Change every 10 days   CONTINUOUS BLOOD GLUC TRANSMIT (DEXCOM G6 TRANSMITTER) MISC    Change every 3 months   CONTINUOUS GLUCOSE SENSOR (DEXCOM G7 SENSOR) MISC    USE FOR CONTINUOUS BLOOD GLUCOSE MONITORING, REPLACE EVERY 10 DAYS.   DASIGLUCAGON HCL (ZEGALOGUE) 0.6 MG/0.6ML SOAJ    Inject 0.6 mLs into the skin as needed. To be injected when unable to treat low blood sugars by mouth   DULOXETINE (CYMBALTA) 60 MG CAPSULE    Take 1 capsule (60 mg total) by mouth daily.   EZETIMIBE (ZETIA) 10 MG TABLET    TAKE 1  TABLET BY MOUTH ONCE DAILY.   FENOFIBRATE (TRICOR) 145 MG TABLET    Take 1 tablet (145 mg total) by mouth daily.   FUROSEMIDE (LASIX) 40 MG TABLET    Take 40 mg by mouth. Take two tablets once daily   GABAPENTIN (NEURONTIN) 300 MG CAPSULE    Take 300 mg by mouth 3 (three) times daily. Take 300mg  by mouth three times daily.   GLUCOSE BLOOD (ACCU-CHEK AVIVA PLUS) TEST STRIP    TEST UP TO 4 TIMES DAILY Dx code E11.65   HUMULIN R 500 UNIT/ML INJECTION    USE AS DIRECTED, INJECT UP TO 35 UNITS 3 TIMES A DAY.   INSULIN DISPOSABLE PUMP (OMNIPOD CLASSIC PODS, GEN 3,) MISC    Use for insulin delivery and change every 48 hours   INSULIN PEN NEEDLE 32G X 8 MM MISC    Use as directed   LANCETS (FREESTYLE) LANCETS    Use as instructed   LEVOTHYROXINE (SYNTHROID) 125 MCG TABLET    Take 1 tablet daily   LIRAGLUTIDE (VICTOZA) 18 MG/3ML SOPN    Inject 0.3 mLs (1.8 mg total) into the skin daily.   LISINOPRIL (ZESTRIL) 5 MG TABLET    Take 5 mg by mouth daily.   MECLIZINE (ANTIVERT) 25 MG TABLET    Take 1 tablet (25 mg total) by mouth every 8 (eight) hours as needed for dizziness.   MIDODRINE (PROAMATINE) 5 MG TABLET    Take 5 mg by mouth 3 (three) times daily.   MULTIPLE VITAMIN (MULTI-VITAMINS) TABS    Take by mouth.   POLYETHYLENE GLYCOL (MIRALAX / GLYCOLAX) PACKET    Take 17 g by mouth daily as needed for mild constipation.   TAMSULOSIN (FLOMAX) 0.4 MG CAPS CAPSULE    Take by mouth.   TIZANIDINE (ZANAFLEX) 4 MG TABLET    Take 4 mg by mouth 2 (two) times daily.   VITAMIN D, ERGOCALCIFEROL, (DRISDOL) 1.25 MG (50000 UNIT) CAPS CAPSULE    Take 50,000 Units by mouth once a week.   ZINC GLUCONATE 50 MG TABLET    Take by mouth.  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 Ninetta Lights, uses bipap   Syncope    Type 2 diabetes mellitus (HCC)    Venous insufficiency     Past Surgical History Past Surgical History:  Procedure Laterality Date   AMPUTATION Right 06/20/2018   Procedure: RIGHT GREAT TOE AMPUTATION;  Surgeon: Nadara Mustard, MD;  Location: [facility] OR;  Service: Orthopedics;  Laterality: Right;   AMPUTATION Right 09/26/2018   Procedure: RIGHT FOOT 5TH RAY AMPUTATION;  Surgeon: Nadara Mustard, MD;  Location: [facility] OR;  Service: Orthopedics;  Laterality: Right;  MAC and regional anesthesia   AMPUTATION Right 10/05/2018   Procedure: RIGHT BELOW KNEE AMPUTATION;  Surgeon: Nadara Mustard, MD;  Location: [facility] OR;  Service: Orthopedics;  Laterality: Right;   AMPUTATION Right 10/26/2018   Procedure: RIGHT ABOVE KNEE AMPUTATION;  Surgeon: Nadara Mustard, MD;  Location: [facility] OR;  Service: Orthopedics;  Laterality: Right;   COLONOSCOPY     RESECTION BONE TUMOR FEMUR  1980's   Left femur, treated at [facility] with bone graft   STUMP REVISION Right 11/09/2018   Procedure: REVISION RIGHT ABOVE KNEE AMPUTATION;  Surgeon: Nadara Mustard, MD;  Location: [facility] OR;  Service: Orthopedics;  Laterality: Right;   TOTAL KNEE ARTHROPLASTY Left 2013    Family History family history includes Breast cancer in his mother and sister; Colon cancer in his sister; Heart attack (age of onset: 61) in his father; Heart attack (age of onset: 52) in his mother; Stroke in his mother.  Social History Social History    Socioeconomic History   Marital status: Single    Spouse name: Not on file   Number of children: 0   Years of education: GED   Highest education level: Not on file  Occupational History   Occupation: Disabled  Tobacco Use   Smoking status: Former    Types: Cigarettes   Smokeless tobacco: Never   Tobacco comments:    11/27/14 "quit smoking years ago"  Vaping Use   Vaping status: Never Used  Substance and Sexual Activity   Alcohol use: No    Alcohol/week: 0.0 standard drinks of alcohol   Drug use: No   Sexual activity: Not Currently  Other Topics Concern   Not on file  Social History Narrative   Right Handed   Lives in a one story trailer   Drinks caffeine - drinks sugar free diet soda   Social Determinants of Health   Financial Resource Strain: Low Risk  (08/02/2021)   Received from [facility], [facility]   Overall Financial Resource Strain (CARDIA)    Difficulty of Paying Living Expenses:  Not hard at all  Food Insecurity: No Food Insecurity (08/02/2021)   Received from [facility], [facility]   Hunger Vital Sign    Worried About Running Out of Food in the Last Year: Never true    Ran Out of Food in the Last Year: Never true  Transportation Needs: No Transportation Needs (08/02/2021)   Received from [facility], [facility]   [facility] - Transportation    Lack of Transportation (Medical): No    Lack of Transportation (Non-Medical): No  Physical Activity: Not on file  Stress: Not on file  Social Connections: Unknown (12/06/2021)   Received from [facility], [facility]   Social Network    Social Network: Not on file  Intimate Partner Violence: Unknown (10/28/2021)   Received from [facility], [facility]   HITS    Physically Hurt: Not on file    Insult or Talk Down To: Not on file    Threaten Physical Harm: Not on file    Scream or Curse: Not on file    Lab Results  Component Value Date   HGBA1C 6.7 (A) 04/26/2023   HGBA1C 7.8 (A)  10/04/2022   HGBA1C 6.8 (A) 02/23/2022   Lab Results  Component Value Date   CHOL 93 02/23/2022   Lab Results  Component Value Date   HDL 29.50 (L) 02/23/2022   Lab Results  Component Value Date   LDLCALC 52 02/23/2022   Lab Results  Component Value Date   TRIG 60.0 02/23/2022   Lab Results  Component Value Date   CHOLHDL 3 02/23/2022   Lab Results  Component Value Date   CREATININE 2.87 (H) 10/04/2022   Lab Results  Component Value Date   GFR 22.92 (L) 10/04/2022   Lab Results  Component Value Date   MICROALBUR 16.7 (H) 08/19/2019      Component Value Date/Time   NA 140 10/04/2022 1022   NA 138 04/26/2017 1415   K 5.1 10/04/2022 1022   CL 111 10/04/2022 1022   CO2 23 10/04/2022 1022   GLUCOSE 120 (H) 10/04/2022 1022   BUN 44 (H) 10/04/2022 1022   BUN 21 04/26/2017 1415   CREATININE 2.87 (H) 10/04/2022 1022   CALCIUM 9.6 10/04/2022 1022   PROT 7.6 02/23/2022 1224   ALBUMIN 3.8 02/23/2022 1224   AST 53 (H) 02/23/2022 1224   ALT 55 (H) 02/23/2022 1224   ALKPHOS 56 02/23/2022 1224   BILITOT 0.4 02/23/2022 1224   GFRNONAA >60 11/12/2018 0446   GFRAA >60 11/12/2018 0446      Latest Ref Rng & Units 10/04/2022   10:22 AM 02/23/2022   12:24 PM 10/14/2021    2:33 PM  BMP  Glucose 70 - 99 mg/dL 191  45  478   BUN 6 - 23 mg/dL 44  73  33   Creatinine 0.40 - 1.50 mg/dL 2.95  6.21  3.08   Sodium 135 - 145 mEq/L 140  138  143   Potassium 3.5 - 5.1 mEq/L 5.1  4.8  4.4   Chloride 96 - 112 mEq/L 111  103  104   CO2 19 - 32 mEq/L 23  26  35   Calcium 8.4 - 10.5 mg/dL 9.6  9.4  8.7        Component Value Date/Time   WBC 7.7 11/12/2018 0446   RBC 2.62 (L) 11/12/2018 0446   HGB 7.2 (L) 11/12/2018 0446   HCT 24.2 (L) 11/12/2018 0446  PLT 520 (H) 11/12/2018 0446   MCV 92.4 11/12/2018 0446   MCH 27.5 11/12/2018 0446   MCHC 29.8 (L) 11/12/2018 0446   RDW 17.3 (H) 11/12/2018 0446   LYMPHSABS 2.1 11/06/2018 1848   MONOABS 1.0 11/06/2018 1848   EOSABS 0.0  11/06/2018 1848   BASOSABS 0.0 11/06/2018 1848     Parts of this note may have been dictated using voice recognition software. There may be variances in spelling and vocabulary which are unintentional. Not all errors are proofread. Please notify the Thereasa Parkin if any discrepancies are noted or if the meaning of any statement is not clear.

## 2023-04-26 NOTE — Patient Instructions (Signed)

## 2023-04-27 ENCOUNTER — Encounter: Payer: Self-pay | Admitting: "Endocrinology

## 2023-05-15 NOTE — Progress Notes (Signed)
This 62 yo male presents for TRUS/Bx for rising PSA--most recently 6.9.Risks, benefits, and some of the potential complications of a transrectal ultrasounds of the prostate (TRUSP) with biopsies were discussed at length with the patient including gross hematuria, blood in the bowel movements, hematospermia, bacteremia, infection, voiding discomfort, urinary retention, fever, chills, sepsis, blood transfusion, death, and others. All questions were answered. Informed consent was obtained. The patient confirmed that he had taken his pre-procedure antibiotic. All anticoagulants were discontinued prior to the procedure. The patient emptied his bladder. He was positioned in a comfortable left lateral decubitus position with hips and knees acutely flexed.  The rectal probe was inserted into the rectum without difficulty. 10cc of 2% Lidocaine without epinephrine was instilled with a spinal needle using ultrasound guidance near the junction of each seminal vesicle and the prostate.  Sequential transverse (axial) scans were made in small increments beginning at the seminal vesicles and ending at the prostatic apex. Sequential longitudinal (saggital) scans were made in small increments beginning at the right lateral prostate and ending at the left lateral prostate. Excellent anatomical imaging was obtained. The peripheral, transitional, and central zones were well-defined. The seminal vesicles were normal.  Prostate volume 32.4 ml.  There were no hypoechoic areas. 12 needle core biopsies were performed. 1 biopsy each was taken from the following areas:  Right lateral base, right medial base, right lateral mid prostate, right medial mid prostate, right lateral apical prostate, right medial apical prostate, left lateral base, left medial base, left lateral mid prostate, left medial mid prostate, left lateral apical prostate, left medial apical prostate.. Minimal prostatic calcifications were noted. Excellent biopsy  specimens were obtained.  Follow-up rectal examination was unremarkable. The procedure was well-tolerated and without complications. Antibiotic instructions were given. The patient was told that:  For several days:  he should increase his fluid intake and limit strenuous activity  he might have mild discomfort at the base of his penis or in his rectum  he might have blood in his urine or blood in his bowel movements  For 2-3 months:  he might have blood in his ejaculate (semen)  Instructions were given to call the office immedicately for blood clots in the urine or bowel movements, difficulty urinating, inability to urinate, urinary retention, painful or frequent urination, fever, chills, nausea, vomiting, or other illness. The patient stated that he understood these instructions and would comply with them. We told the patient that prostate biopsy pathology reports are usually available within 3-5 working days, unless a pathologic second opinion is required, which may take 7-14 days. We told him to contact us to check on the status of his biopsy if he has not heard from [place] within 7 days. The patient left the ultrasound examination room in stable condition.

## 2023-05-16 ENCOUNTER — Ambulatory Visit (HOSPITAL_BASED_OUTPATIENT_CLINIC_OR_DEPARTMENT_OTHER): Payer: Medicaid Other | Admitting: Urology

## 2023-05-16 ENCOUNTER — Other Ambulatory Visit: Payer: Self-pay | Admitting: Urology

## 2023-05-16 ENCOUNTER — Ambulatory Visit (HOSPITAL_COMMUNITY)
Admission: RE | Admit: 2023-05-16 | Discharge: 2023-05-16 | Disposition: A | Discharge status: Home / Self Care | Payer: Medicaid Other | Source: Ambulatory Visit | Attending: Urology | Admitting: Urology

## 2023-05-16 ENCOUNTER — Encounter (HOSPITAL_COMMUNITY): Payer: Self-pay

## 2023-05-16 DIAGNOSIS — R972 Elevated prostate specific antigen [PSA]: Secondary | ICD-10-CM | POA: Insufficient documentation

## 2023-05-16 DIAGNOSIS — C61 Malignant neoplasm of prostate: Secondary | ICD-10-CM | POA: Diagnosis not present

## 2023-05-16 MED ORDER — GENTAMICIN SULFATE 40 MG/ML IJ SOLN
160.0000 mg | Freq: Once | INTRAMUSCULAR | Status: AC
  Administered 2023-05-16: 160 mg via INTRAMUSCULAR

## 2023-05-16 MED ORDER — LIDOCAINE HCL (PF) 2 % IJ SOLN
INTRAMUSCULAR | Status: AC
  Filled 2023-05-16: qty 10

## 2023-05-16 MED ORDER — GENTAMICIN SULFATE 40 MG/ML IJ SOLN
INTRAMUSCULAR | Status: AC
  Filled 2023-05-16: qty 4

## 2023-05-16 MED ORDER — LIDOCAINE HCL (PF) 2 % IJ SOLN
10.0000 mL | Freq: Once | INTRAMUSCULAR | Status: AC
  Administered 2023-05-16: 10 mL

## 2023-05-16 NOTE — Progress Notes (Signed)
PT tolerated prostate biopsy and antibiotic injection procedure well today. Labs obtained and sent for pathology by Richard from ultrasound. PT left via wheelchair at discharge with no acute distress noted and verbalized understanding of discharge instructions.

## 2023-05-17 ENCOUNTER — Other Ambulatory Visit: Payer: Self-pay

## 2023-05-18 LAB — SURGICAL PATHOLOGY

## 2023-05-22 ENCOUNTER — Telehealth: Payer: Self-pay

## 2023-05-22 ENCOUNTER — Other Ambulatory Visit (HOSPITAL_COMMUNITY): Payer: Self-pay

## 2023-05-22 NOTE — Telephone Encounter (Signed)
Per patient his Insulin Disposable Pump (OMNIPOD CLASSIC PODS, GEN 3, is requiring prior auth, please advise

## 2023-05-29 ENCOUNTER — Telehealth: Payer: Self-pay

## 2023-05-29 ENCOUNTER — Other Ambulatory Visit (HOSPITAL_COMMUNITY): Payer: Self-pay

## 2023-05-29 NOTE — Telephone Encounter (Signed)
Pharmacy Patient Advocate Encounter   Received notification from Pt Calls Messages that prior authorization for Omnipod gen 3 is required/requested.   Insurance verification completed.   The patient is insured through [facility] MEDICAID .   Received a fax from his insurance that I have completed and sent back on 11/1.  However, test claim today shows that Omnipod Gen 3 is no longer covered and insurance would prefer he use Omnipod 5 DexG7G6 pods.   Copay for omnipod 5 is $0. Please advise.

## 2023-05-30 NOTE — Telephone Encounter (Signed)
complete

## 2023-06-01 NOTE — Telephone Encounter (Signed)
Ethan Campbell is aware and would like to go through [place] if possible for upgrade

## 2023-06-27 ENCOUNTER — Ambulatory Visit: Payer: Medicaid Other | Admitting: Nutrition

## 2023-07-04 ENCOUNTER — Encounter: Payer: Medicaid Other | Attending: "Endocrinology | Admitting: Nutrition

## 2023-07-04 ENCOUNTER — Telehealth: Payer: Self-pay

## 2023-07-04 ENCOUNTER — Other Ambulatory Visit (HOSPITAL_COMMUNITY): Payer: Self-pay

## 2023-07-04 DIAGNOSIS — E1165 Type 2 diabetes mellitus with hyperglycemia: Secondary | ICD-10-CM | POA: Diagnosis present

## 2023-07-04 NOTE — Telephone Encounter (Signed)
Patient Advocate Encounter   Received notification from [facility] that prior authorization renewal is required for Dexcom G7 sensor   Submitted via fax: 07/04/2023 Fax# [phone]   Status is pending

## 2023-07-04 NOTE — Progress Notes (Addendum)
Patient is here with his 2 grand daughters.  He did not bring his phone (which does not support the dexcom G7 app.  His wife's phone is what he is using to use his dexcom.   He is currently not on the Dash system, and is taking his R-u500 insuilin via a syringe.   We set up his OmnIPod account:  under his daughter's email :  User name:  Ethan Campbell and password: [email or web address]!  He was given a new dexcom sensor and linked this to his PDM.  Lot # [id number], Exp: 5/24.  Settings were put into the PDM per Dr. Grafton Folk last office note.  Basal rate settings 12 AM = 0.45, 2 AM = 0.35, 8 AM = 0.90,  and 4 PM = 1.2  IC ratio 1 Blood sugar target 150 between 12 AM-7 AM and then 130  Sensitivity 1: 50, active insulin 6 hours, max basal: 2.4, max bolus 15. He was to be taking 4-7u for each bolus, but patient says he never did this.  We reviewed how to bolus, making sure he puts in and insulin amount in the carb box, and his current blood sugar readings into the CGM box.  He re demonstrated this X2 correctly and had no final questions about how to do this.  We reveiwed how this pump will deliver insulin in 6 days and the need to keep the pump in the automated mode. He filled a pod with R U-500 insulin and attached this to his right arm.  We discussed proper placement of pods with respect to his sensor.  He had no questions about this.  His sensor has not linked to the PDM as yet.  He was given the 800 help line number if this does not connect in the next 30 minutes.   We reveiwed the checklist and he had no final questions.  He signed this checklist and his daughter agreed to call me when she get home to help with linking the new sensor to her phone and to the clarity app.   12PM: patient's PDM is linked to [facility].  Daughter is not able to set up new dexcom account for G7.  I gave her the 800 help line number and she will call me back so that I can connect her to Clarity.

## 2023-07-04 NOTE — Patient Instructions (Signed)
Link your wife's phone to the new dexcom sensor and call me to link the readings to clarity app Call Dexcom if questions or problems with your sensor Call OmniPod if questions or problems with you pump

## 2023-07-05 ENCOUNTER — Other Ambulatory Visit: Payer: Self-pay | Admitting: "Endocrinology

## 2023-07-06 ENCOUNTER — Telehealth: Payer: Self-pay | Admitting: Nutrition

## 2023-07-06 NOTE — Telephone Encounter (Signed)
Patient reported no difficulty giving the boluses-and he says he is putting 4-7u for the meal time insulin dose, as well as the blood sugar readings.  He reports one low blood sugar since starting the pump.  This was at 4AM this morning.  Says he was awoken and it read 64.  He denied symptoms of low blood sugar.  He was told that if this happens again, he should test his blood sugar with a finger stick and meter, and if he is actually low, to call the office and let us know. He agreed to do this and had no final questions.

## 2023-07-27 ENCOUNTER — Ambulatory Visit: Payer: Medicaid Other | Admitting: "Endocrinology

## 2023-08-01 ENCOUNTER — Telehealth: Payer: Self-pay

## 2023-08-01 MED ORDER — HUMULIN R U-500 (CONCENTRATED) 500 UNIT/ML ~~LOC~~ SOLN: SUBCUTANEOUS | 1 refills | Status: DC

## 2023-08-01 NOTE — Telephone Encounter (Addendum)
 Requested Prescriptions   Signed Prescriptions Disp Refills   insulin  regular human CONCENTRATED (HUMULIN  R) 500 UNIT/ML injection 36 mL 1    Sig: USE AS DIRECTED, INJECT UP TO 35 UNITS 3 TIMES A DAY.    Authorizing Provider: DARTHA ERNST    Ordering User: CLEOTILDE ROLIN RAMAN

## 2023-08-18 ENCOUNTER — Ambulatory Visit (INDEPENDENT_AMBULATORY_CARE_PROVIDER_SITE_OTHER): Payer: Medicaid Other | Admitting: "Endocrinology

## 2023-08-18 ENCOUNTER — Encounter: Payer: Self-pay | Admitting: "Endocrinology

## 2023-08-18 VITALS — BP 124/70 | HR 94

## 2023-08-18 DIAGNOSIS — Z794 Long term (current) use of insulin: Secondary | ICD-10-CM | POA: Diagnosis not present

## 2023-08-18 DIAGNOSIS — Z9641 Presence of insulin pump (external) (internal): Secondary | ICD-10-CM

## 2023-08-18 DIAGNOSIS — E1165 Type 2 diabetes mellitus with hyperglycemia: Secondary | ICD-10-CM | POA: Diagnosis not present

## 2023-08-18 DIAGNOSIS — E7800 Pure hypercholesterolemia, unspecified: Secondary | ICD-10-CM

## 2023-08-18 LAB — POCT GLYCOSYLATED HEMOGLOBIN (HGB A1C): Hemoglobin A1C: 7.5 % — AB (ref 4.0–5.6)

## 2023-08-18 NOTE — Patient Instructions (Signed)

## 2023-08-18 NOTE — Progress Notes (Signed)
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, nephropathy, retinopathy, pacemaker   Lab Results  Component Value Date   GFR 19.31 (L) 04/26/2023   Hba1c goal less than 7, current Hba1c is  Lab Results  Component Value Date   HGBA1C 7.5 (A) 08/18/2023   Will recommend the following: Insulin: Humulin R U-500  Omnipod PUMP settings:   Basal rate settings 12 AM = 0.35, 2 AM = 0.35, 8 AM = 1.0,  and 4 PM = 1.25  Preset boluses: boluses 30 min before meals by putting sugar and carb as 4-7  Blood sugar target 150 between 12 AM-7 AM and then 130 Sensitivity 1: 50, active insulin 6 hours  No known contraindications/side effects to any of above medications  -Last LD and Tg are as follows: Lab Results  Component Value Date   LDLCALC 46 04/26/2023    Lab Results  Component Value Date   TRIG 98.0 04/26/2023   -On atorvastatin 80 mg every day, ezetimibe 10 gm every day, fenofibrate 145mg  every day  -08/18/23: Asked to stop fenofibrate given normal Tg and GFR 19 -Follow low fat diet and exercise   -Blood pressure goal <140/90 - Microalbumin/creatinine goal is < 30 -Last MA/Cr is as follows: Lab Results  Component Value Date   MICROALBUR 59.2 (H) 04/26/2023   -on ACE/ARB lisinopril 5 mg every day, sees nephrologist  -diet changes including salt restriction -limit eating outside -counseled BP targets per standards of diabetes care -uncontrolled blood  pressure can lead to retinopathy, nephropathy and cardiovascular and atherosclerotic heart disease  Reviewed and counseled on: -A1C target -Blood sugar targets -Complications of uncontrolled diabetes  -Checking blood sugar before meals and bedtime and bring log next visit -All medications with mechanism of action and side effects -Hypoglycemia management: rule of 15's, Glucagon Emergency Kit and medical alert ID -low-carb low-fat plate-method diet -At least 20 minutes of physical activity per day -Annual dilated retinal eye exam and foot exam -compliance and follow up needs -follow up as scheduled or earlier if problem gets worse  Call if blood sugar is less than 70 or consistently above 250    Take a 15 gm snack of carbohydrate at bedtime before you go to sleep if your blood sugar is less than 100.    If you are going to fast after midnight for a test or procedure, ask your physician for instructions on how to reduce/decrease your insulin dose.    Call if blood sugar is less than 70 or consistently above 250  -Treating a low sugar by rule of 15  (15 gms of sugar every 15 min until sugar is more than 70) If you feel your sugar is low, test your sugar to be sure If your sugar is low (less than 70), then take 15 grams of a fast acting Carbohydrate (3-4 glucose tablets or glucose gel or 4 ounces of juice or regular soda) Recheck your sugar 15 min after treating low to make sure it  is more than 70 If sugar is still less than 70, treat again with 15 grams of carbohydrate          Don't drive the hour of hypoglycemia  If unconscious/unable to eat or drink by mouth, use glucagon injection or nasal spray baqsimi and call 911. Can repeat again in 15 min if still unconscious.  Return in about 2 months (around 10/16/2023).   I have reviewed current medications, nurse's notes, allergies, vital signs, past medical and surgical history, family medical history, and social history for this  encounter. Counseled patient on symptoms, examination findings, lab findings, imaging results, treatment decisions and monitoring and prognosis. The patient understood the recommendations and agrees with the treatment plan. All questions regarding treatment plan were fully answered.  Ethan [place], MD  08/18/23    History of Present Illness Ethan Campbell is a 63 y.o. year old male who presents for evaluation of Type II diabetes mellitus.  Ethan Campbell was first diagnosed in 2000.   Diabetes education +  Home diabetes regimen: He was started on the OMNIPOD pump on 02/06/2018  Recent history:  Non-insulin hypoglycemic drugs: Victoza 1.8 mg daily?  Insulin: Humulin R U-500  Omnipod PUMP settings:   Basal rate settings 12 AM = 0.45, 2 AM = 0.35, 8 AM = 0.90,  and 4 PM = 1.20  Preset boluses: 4-7 units with IC ratio 10 Blood sugar target 150 between 12 AM-7 AM and then 130 Sensitivity 1: 50, active insulin 6 hours   COMPLICATIONS +  MI/Stroke +  retinopathy +  neuropathy +  nephropathy  BLOOD SUGAR DATA     CGM interpretation: At today's visit, we reviewed her CGM downloads. The full report is scanned in the media. Reviewing the CGM trends, BG are mildly elevated in daytime and low overnight.  Physical Exam  BP 124/70   Pulse 94   SpO2 94%    Constitutional: well developed, well nourished Head: normocephalic, atraumatic Eyes: sclera anicteric, no redness Neck: supple Lungs: normal respiratory effort Neurology: alert and oriented Skin: dry, no appreciable rashes Musculoskeletal: no appreciable defects Psychiatric: normal mood and affect Diabetic Foot Exam - Simple   No data filed      Current Medications Patient's Medications  New Prescriptions   No medications on file  Previous Medications   ASPIRIN EC 81 MG EC TABLET    Take 1 tablet (81 mg total) by mouth 2 (two) times a day.   ATORVASTATIN (LIPITOR) 80 MG TABLET    Take 1 tablet (80 mg total)  by mouth daily.   BLOOD GLUCOSE METER KIT AND SUPPLIES    Dispense based on patient and insurance preference. Use up to four times daily as directed. (FOR ICD-10 E10.9, E11.9).   CARVEDILOL (COREG) 6.25 MG TABLET    Take 1 tablet (6.25 mg total) by mouth 2 (two) times daily with a meal.   CONTINUOUS BLOOD GLUC RECEIVER (DEXCOM G6 RECEIVER) DEVI    Check blood sugar daily   CONTINUOUS BLOOD GLUC RECEIVER (DEXCOM G7 RECEIVER) DEVI    To display CGM data   CONTINUOUS BLOOD GLUC RECEIVER (FREESTYLE LIBRE 2 READER) DEVI    Use to check blood sugar daily   CONTINUOUS BLOOD GLUC SENSOR (DEXCOM G6 SENSOR) MISC    Change every 10 days   CONTINUOUS BLOOD GLUC TRANSMIT (DEXCOM G6 TRANSMITTER) MISC    Change every 3 months   CONTINUOUS GLUCOSE SENSOR (DEXCOM G7 SENSOR) MISC    USE FOR  CONTINUOUS BLOOD GLUCOSE MONITORING, REPLACE EVERY 10 DAYS.   DASIGLUCAGON HCL (ZEGALOGUE) 0.6 MG/0.6ML SOAJ    Inject 0.6 mLs into the skin as needed. To be injected when unable to treat low blood sugars by mouth   DULOXETINE (CYMBALTA) 60 MG CAPSULE    Take 1 capsule (60 mg total) by mouth daily.   EZETIMIBE (ZETIA) 10 MG TABLET    TAKE 1 TABLET BY MOUTH ONCE DAILY.   FUROSEMIDE (LASIX) 40 MG TABLET    Take 40 mg by mouth. Take two tablets once daily   GABAPENTIN (NEURONTIN) 300 MG CAPSULE    Take 300 mg by mouth 3 (three) times daily. Take 300mg  by mouth three times daily.   GLUCOSE BLOOD (ACCU-CHEK AVIVA PLUS) TEST STRIP    TEST UP TO 4 TIMES DAILY Dx code E11.65   INSULIN DISPOSABLE PUMP (OMNIPOD CLASSIC PODS, GEN 3,) MISC    Use for insulin delivery and change every 48 hours   INSULIN PEN NEEDLE 32G X 8 MM MISC    Use as directed   INSULIN REGULAR HUMAN CONCENTRATED (HUMULIN R) 500 UNIT/ML INJECTION    USE AS DIRECTED, INJECT UP TO 35 UNITS 3 TIMES A DAY.   LANCETS (FREESTYLE) LANCETS    Use as instructed   LEVOTHYROXINE (SYNTHROID) 125 MCG TABLET    Take 1 tablet daily   LIRAGLUTIDE (VICTOZA) 18 MG/3ML SOPN    Inject 0.3  mLs (1.8 mg total) into the skin daily.   LISINOPRIL (ZESTRIL) 5 MG TABLET    Take 5 mg by mouth daily.   MECLIZINE (ANTIVERT) 25 MG TABLET    Take 1 tablet (25 mg total) by mouth every 8 (eight) hours as needed for dizziness.   MIDODRINE (PROAMATINE) 5 MG TABLET    Take 5 mg by mouth 3 (three) times daily.   MULTIPLE VITAMIN (MULTI-VITAMINS) TABS    Take by mouth.   POLYETHYLENE GLYCOL (MIRALAX / GLYCOLAX) PACKET    Take 17 g by mouth daily as needed for mild constipation.   TAMSULOSIN (FLOMAX) 0.4 MG CAPS CAPSULE    Take by mouth.   TIZANIDINE (ZANAFLEX) 4 MG TABLET    Take 4 mg by mouth 2 (two) times daily.   VITAMIN D, ERGOCALCIFEROL, (DRISDOL) 1.25 MG (50000 UNIT) CAPS CAPSULE    Take 50,000 Units by mouth once a week.   ZINC GLUCONATE 50 MG TABLET    Take by mouth.  Modified Medications   No medications on file  Discontinued Medications   FENOFIBRATE (TRICOR) 145 MG TABLET    Take 1 tablet (145 mg total) by mouth daily.    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 Ninetta Lights, uses bipap   Syncope    Type 2 diabetes mellitus (HCC)    Venous insufficiency     Past Surgical History Past Surgical History:  Procedure Laterality Date   AMPUTATION Right 06/20/2018   Procedure: RIGHT GREAT TOE AMPUTATION;  Surgeon: Nadara Mustard, MD;  Location: [facility] OR;   Service: Orthopedics;  Laterality: Right;   AMPUTATION Right 09/26/2018   Procedure: RIGHT FOOT 5TH RAY AMPUTATION;  Surgeon: Nadara Mustard, MD;  Location: [facility] OR;  Service: Orthopedics;  Laterality: Right;  MAC and regional anesthesia   AMPUTATION Right 10/05/2018   Procedure: RIGHT BELOW KNEE AMPUTATION;  Surgeon: Nadara Mustard, MD;  Location: [facility] OR;  Service: Orthopedics;  Laterality: Right;   AMPUTATION Right 10/26/2018   Procedure: RIGHT ABOVE KNEE AMPUTATION;  Surgeon: Nadara Mustard, MD;  Location: [facility] OR;  Service: Orthopedics;  Laterality: Right;   COLONOSCOPY     RESECTION BONE TUMOR FEMUR  1980's   Left femur, treated at [facility] with bone graft   STUMP REVISION Right 11/09/2018   Procedure: REVISION RIGHT ABOVE KNEE AMPUTATION;  Surgeon: Nadara Mustard, MD;  Location: [facility] OR;  Service: Orthopedics;  Laterality: Right;   TOTAL KNEE ARTHROPLASTY Left 2013    Family History family history includes Breast cancer in his mother and sister; Colon cancer in his sister; Heart attack (age of onset: 85) in his father; Heart attack (age of onset: 61) in his mother; Stroke in his mother.  Social History Social History   Socioeconomic History   Marital status: Single    Spouse name: Not on file   Number of children: 0   Years of education: GED   Highest education level: Not on file  Occupational History   Occupation: Disabled  Tobacco Use   Smoking status: Former    Types: Cigarettes   Smokeless tobacco: Never   Tobacco comments:    11/27/14 "quit smoking years ago"  Vaping Use   Vaping status: Never Used  Substance and Sexual Activity   Alcohol use: No    Alcohol/week: 0.0 standard drinks of alcohol   Drug use: No   Sexual activity: Not Currently  Other Topics Concern   Not on file  Social History Narrative   Right Handed   Lives in a one story trailer   Drinks caffeine - drinks sugar free diet soda   Social Drivers of Health   Financial Resource Strain: Low Risk  (08/02/2021)    Received from [facility], [facility]   Overall Financial Resource Strain (CARDIA)    Difficulty of Paying Living Expenses: Not hard at all  Food Insecurity: No Food Insecurity (08/02/2021)   Received from [facility], [facility]   Hunger Vital Sign    Worried About Running Out of Food in the Last Year: Never true    Ran Out of Food in the Last Year: Never true  Transportation Needs: No Transportation Needs (08/02/2021)   Received from [place], [facility]   PRAPARE - Transportation    Lack of Transportation (Medical): No    Lack of Transportation (Non-Medical): No  Physical Activity: Not on file  Stress: Not on file  Social Connections: Unknown (12/06/2021)   Received from [facility], [facility]   Social Network    Social Network: Not on file  Intimate Partner Violence: Unknown (10/28/2021)   Received from [facility], [facility]   HITS  Physically Hurt: Not on file    Insult or Talk Down To: Not on file    Threaten Physical Harm: Not on file    Scream or Curse: Not on file    Lab Results  Component Value Date   HGBA1C 7.5 (A) 08/18/2023   HGBA1C 6.7 (A) 04/26/2023   HGBA1C 7.8 (A) 10/04/2022   Lab Results  Component Value Date   CHOL 99 04/26/2023   Lab Results  Component Value Date   HDL 33.20 (L) 04/26/2023   Lab Results  Component Value Date   LDLCALC 46 04/26/2023   Lab Results  Component Value Date   TRIG 98.0 04/26/2023   Lab Results  Component Value Date   CHOLHDL 3 04/26/2023   Lab Results  Component Value Date   CREATININE 3.30 (H) 04/26/2023   Lab Results  Component Value Date   GFR 19.31 (L) 04/26/2023   Lab Results  Component Value Date   MICROALBUR 59.2 (H) 04/26/2023      Component Value Date/Time   NA 138 04/26/2023 1041   NA 138 04/26/2017 1415   K 6.0 No hemolysis seen (H) 04/26/2023 1041   CL 109 04/26/2023 1041   CO2 22 04/26/2023 1041   GLUCOSE 197 (H) 04/26/2023 1041   BUN 58 (H)  04/26/2023 1041   BUN 21 04/26/2017 1415   CREATININE 3.30 (H) 04/26/2023 1041   CALCIUM 8.8 04/26/2023 1041   PROT 6.5 04/26/2023 1041   ALBUMIN 3.5 04/26/2023 1041   AST 37 04/26/2023 1041   ALT 37 04/26/2023 1041   ALKPHOS 47 04/26/2023 1041   BILITOT 0.5 04/26/2023 1041   GFRNONAA >60 11/12/2018 0446   GFRAA >60 11/12/2018 0446      Latest Ref Rng & Units 04/26/2023   10:41 AM 10/04/2022   10:22 AM 02/23/2022   12:24 PM  BMP  Glucose 70 - 99 mg/dL 161  096  45   BUN 6 - 23 mg/dL 58  44  73   Creatinine 0.40 - 1.50 mg/dL 0.45  4.09  8.11   Sodium 135 - 145 mEq/L 138  140  138   Potassium 3.5 - 5.1 mEq/L 6.0 No hemolysis seen  5.1  4.8   Chloride 96 - 112 mEq/L 109  111  103   CO2 19 - 32 mEq/L 22  23  26    Calcium 8.4 - 10.5 mg/dL 8.8  9.6  9.4        Component Value Date/Time   WBC 7.7 11/12/2018 0446   RBC 2.62 (L) 11/12/2018 0446   HGB 7.2 (L) 11/12/2018 0446   HCT 24.2 (L) 11/12/2018 0446   PLT 520 (H) 11/12/2018 0446   MCV 92.4 11/12/2018 0446   MCH 27.5 11/12/2018 0446   MCHC 29.8 (L) 11/12/2018 0446   RDW 17.3 (H) 11/12/2018 0446   LYMPHSABS 2.1 11/06/2018 1848   MONOABS 1.0 11/06/2018 1848   EOSABS 0.0 11/06/2018 1848   BASOSABS 0.0 11/06/2018 1848     Parts of this note may have been dictated using voice recognition software. There may be variances in spelling and vocabulary which are unintentional. Not all errors are proofread. Please notify the Thereasa Parkin if any discrepancies are noted or if the meaning of any statement is not clear.

## 2023-08-21 ENCOUNTER — Telehealth: Payer: Self-pay

## 2023-08-21 NOTE — Telephone Encounter (Signed)
Patient given medication changes as directed by MD. No further questions at this time.

## 2023-08-21 NOTE — Telephone Encounter (Signed)
-----   Message from [facility] sent at 08/18/2023 11:30 AM EST ----- Please ask the patient to stop taking fenofibrate. He doesn't need this. Thanks

## 2023-08-22 NOTE — Progress Notes (Signed)
Patient advised and verbalized understanding

## 2023-09-20 ENCOUNTER — Telehealth: Payer: Self-pay

## 2023-09-20 NOTE — Telephone Encounter (Signed)
 Pt daughter called stated that pt passed away on 10/18/2023 October 11, 2023 please make chat inactive and cancel upcoming appt.

## 2023-09-20 NOTE — Telephone Encounter (Signed)
 Patients chart updated and appointment was canceled automatically

## 2023-09-23 DEATH — deceased

## 2023-10-16 ENCOUNTER — Ambulatory Visit: Payer: Medicaid Other | Admitting: "Endocrinology
